# Patient Record
Sex: Male | Born: 1970 | Race: White | Hispanic: No | Smoking: Current every day smoker
Health system: Southern US, Community
[De-identification: ages and names within clinical notes are randomized; demographics above are authoritative.]

## PROBLEM LIST (undated history)

## (undated) DIAGNOSIS — E079 Disorder of thyroid, unspecified: Secondary | ICD-10-CM

## (undated) DIAGNOSIS — I10 Essential (primary) hypertension: Secondary | ICD-10-CM

## (undated) DIAGNOSIS — E7800 Pure hypercholesterolemia, unspecified: Secondary | ICD-10-CM

## (undated) DIAGNOSIS — G5620 Lesion of ulnar nerve, unspecified upper limb: Secondary | ICD-10-CM

## (undated) DIAGNOSIS — F32A Depression, unspecified: Secondary | ICD-10-CM

## (undated) DIAGNOSIS — G5610 Other lesions of median nerve, unspecified upper limb: Secondary | ICD-10-CM

## (undated) DIAGNOSIS — R7989 Other specified abnormal findings of blood chemistry: Secondary | ICD-10-CM

## (undated) DIAGNOSIS — J45909 Unspecified asthma, uncomplicated: Secondary | ICD-10-CM

## (undated) DIAGNOSIS — F419 Anxiety disorder, unspecified: Secondary | ICD-10-CM

## (undated) DIAGNOSIS — R531 Weakness: Secondary | ICD-10-CM

## (undated) DIAGNOSIS — E039 Hypothyroidism, unspecified: Secondary | ICD-10-CM

## (undated) DIAGNOSIS — M5412 Radiculopathy, cervical region: Secondary | ICD-10-CM

## (undated) DIAGNOSIS — F329 Major depressive disorder, single episode, unspecified: Secondary | ICD-10-CM

## (undated) DIAGNOSIS — E119 Type 2 diabetes mellitus without complications: Secondary | ICD-10-CM

## (undated) HISTORY — DX: Type 2 diabetes mellitus without complications: E11.9

## (undated) HISTORY — DX: Disorder of thyroid, unspecified: E07.9

## (undated) HISTORY — PX: BICEPS TENDON REPAIR: SHX566

## (undated) HISTORY — DX: Essential (primary) hypertension: I10

## (undated) HISTORY — PX: ROTATOR CUFF REPAIR: SHX139

## (undated) HISTORY — DX: Other specified abnormal findings of blood chemistry: R79.89

## (undated) HISTORY — PX: NECK SURGERY: SHX720

---

## 1998-08-23 ENCOUNTER — Ambulatory Visit (HOSPITAL_BASED_OUTPATIENT_CLINIC_OR_DEPARTMENT_OTHER): Admission: RE | Admit: 1998-08-23 | Discharge: 1998-08-23 | Payer: Self-pay | Admitting: General Surgery

## 2000-05-03 ENCOUNTER — Emergency Department (HOSPITAL_COMMUNITY): Admission: EM | Admit: 2000-05-03 | Discharge: 2000-05-03 | Payer: Self-pay | Admitting: Emergency Medicine

## 2000-06-03 ENCOUNTER — Encounter: Payer: Self-pay | Admitting: General Surgery

## 2000-06-03 ENCOUNTER — Ambulatory Visit (HOSPITAL_COMMUNITY): Admission: RE | Admit: 2000-06-03 | Discharge: 2000-06-03 | Payer: Self-pay | Admitting: General Surgery

## 2000-07-17 ENCOUNTER — Emergency Department (HOSPITAL_COMMUNITY): Admission: EM | Admit: 2000-07-17 | Discharge: 2000-07-17 | Payer: Self-pay | Admitting: Emergency Medicine

## 2000-07-17 ENCOUNTER — Encounter: Payer: Self-pay | Admitting: Emergency Medicine

## 2004-06-18 ENCOUNTER — Emergency Department (HOSPITAL_COMMUNITY): Admission: EM | Admit: 2004-06-18 | Discharge: 2004-06-18 | Payer: Self-pay | Admitting: Emergency Medicine

## 2005-03-25 ENCOUNTER — Ambulatory Visit (HOSPITAL_COMMUNITY): Admission: RE | Admit: 2005-03-25 | Discharge: 2005-03-25 | Payer: Self-pay | Admitting: Internal Medicine

## 2005-04-13 ENCOUNTER — Encounter: Admission: RE | Admit: 2005-04-13 | Discharge: 2005-04-13 | Payer: Self-pay | Admitting: Orthopaedic Surgery

## 2005-05-01 ENCOUNTER — Ambulatory Visit (HOSPITAL_COMMUNITY): Admission: RE | Admit: 2005-05-01 | Discharge: 2005-05-02 | Payer: Self-pay | Admitting: Orthopaedic Surgery

## 2005-09-24 ENCOUNTER — Encounter: Admission: RE | Admit: 2005-09-24 | Discharge: 2005-09-24 | Payer: Self-pay | Admitting: Orthopaedic Surgery

## 2005-11-14 ENCOUNTER — Encounter: Admission: RE | Admit: 2005-11-14 | Discharge: 2005-11-14 | Payer: Self-pay | Admitting: Internal Medicine

## 2005-12-09 ENCOUNTER — Inpatient Hospital Stay (HOSPITAL_COMMUNITY): Admission: RE | Admit: 2005-12-09 | Discharge: 2005-12-11 | Payer: Self-pay | Admitting: Orthopaedic Surgery

## 2013-03-29 DIAGNOSIS — E349 Endocrine disorder, unspecified: Secondary | ICD-10-CM | POA: Insufficient documentation

## 2013-05-06 DIAGNOSIS — G930 Cerebral cysts: Secondary | ICD-10-CM | POA: Insufficient documentation

## 2013-08-11 DIAGNOSIS — M5412 Radiculopathy, cervical region: Secondary | ICD-10-CM | POA: Insufficient documentation

## 2013-08-11 DIAGNOSIS — M5416 Radiculopathy, lumbar region: Secondary | ICD-10-CM | POA: Insufficient documentation

## 2013-11-13 ENCOUNTER — Encounter (HOSPITAL_COMMUNITY): Payer: Self-pay | Admitting: Emergency Medicine

## 2013-11-13 ENCOUNTER — Emergency Department (HOSPITAL_COMMUNITY)
Admission: EM | Admit: 2013-11-13 | Discharge: 2013-11-13 | Disposition: A | Discharge status: Home / Self Care | Payer: Medicaid Other | Attending: Emergency Medicine | Admitting: Emergency Medicine

## 2013-11-13 ENCOUNTER — Emergency Department (HOSPITAL_COMMUNITY): Payer: Medicaid Other

## 2013-11-13 DIAGNOSIS — R5383 Other fatigue: Secondary | ICD-10-CM

## 2013-11-13 DIAGNOSIS — I498 Other specified cardiac arrhythmias: Secondary | ICD-10-CM | POA: Insufficient documentation

## 2013-11-13 DIAGNOSIS — R5381 Other malaise: Secondary | ICD-10-CM | POA: Insufficient documentation

## 2013-11-13 DIAGNOSIS — R519 Headache, unspecified: Secondary | ICD-10-CM

## 2013-11-13 DIAGNOSIS — G5620 Lesion of ulnar nerve, unspecified upper limb: Secondary | ICD-10-CM | POA: Insufficient documentation

## 2013-11-13 DIAGNOSIS — F3289 Other specified depressive episodes: Secondary | ICD-10-CM | POA: Insufficient documentation

## 2013-11-13 DIAGNOSIS — F17200 Nicotine dependence, unspecified, uncomplicated: Secondary | ICD-10-CM | POA: Insufficient documentation

## 2013-11-13 DIAGNOSIS — F329 Major depressive disorder, single episode, unspecified: Secondary | ICD-10-CM | POA: Insufficient documentation

## 2013-11-13 DIAGNOSIS — K5900 Constipation, unspecified: Secondary | ICD-10-CM | POA: Insufficient documentation

## 2013-11-13 DIAGNOSIS — R51 Headache: Secondary | ICD-10-CM | POA: Insufficient documentation

## 2013-11-13 DIAGNOSIS — G5610 Other lesions of median nerve, unspecified upper limb: Secondary | ICD-10-CM | POA: Insufficient documentation

## 2013-11-13 DIAGNOSIS — Z7982 Long term (current) use of aspirin: Secondary | ICD-10-CM | POA: Insufficient documentation

## 2013-11-13 DIAGNOSIS — M549 Dorsalgia, unspecified: Secondary | ICD-10-CM

## 2013-11-13 DIAGNOSIS — Z880 Allergy status to penicillin: Secondary | ICD-10-CM | POA: Insufficient documentation

## 2013-11-13 DIAGNOSIS — Z79899 Other long term (current) drug therapy: Secondary | ICD-10-CM | POA: Insufficient documentation

## 2013-11-13 DIAGNOSIS — F411 Generalized anxiety disorder: Secondary | ICD-10-CM | POA: Insufficient documentation

## 2013-11-13 DIAGNOSIS — M5412 Radiculopathy, cervical region: Secondary | ICD-10-CM | POA: Insufficient documentation

## 2013-11-13 HISTORY — DX: Anxiety disorder, unspecified: F41.9

## 2013-11-13 HISTORY — DX: Major depressive disorder, single episode, unspecified: F32.9

## 2013-11-13 HISTORY — DX: Lesion of ulnar nerve, unspecified upper limb: G56.20

## 2013-11-13 HISTORY — DX: Other lesions of median nerve, unspecified upper limb: G56.10

## 2013-11-13 HISTORY — DX: Depression, unspecified: F32.A

## 2013-11-13 HISTORY — DX: Radiculopathy, cervical region: M54.12

## 2013-11-13 HISTORY — DX: Weakness: R53.1

## 2013-11-13 LAB — BASIC METABOLIC PANEL
BUN: 13 mg/dL (ref 6–23)
CHLORIDE: 100 meq/L (ref 96–112)
CO2: 25 meq/L (ref 19–32)
CREATININE: 0.96 mg/dL (ref 0.50–1.35)
Calcium: 9.3 mg/dL (ref 8.4–10.5)
GLUCOSE: 94 mg/dL (ref 70–99)
POTASSIUM: 4.3 meq/L (ref 3.7–5.3)
SODIUM: 138 meq/L (ref 137–147)

## 2013-11-13 LAB — CBC WITH DIFFERENTIAL/PLATELET
Basophils Absolute: 0 10*3/uL (ref 0.0–0.1)
Basophils Relative: 0 % (ref 0–1)
EOS ABS: 0.1 10*3/uL (ref 0.0–0.7)
EOS PCT: 2 % (ref 0–5)
HCT: 38.6 % — ABNORMAL LOW (ref 39.0–52.0)
HEMOGLOBIN: 13.2 g/dL (ref 13.0–17.0)
LYMPHS PCT: 36 % (ref 12–46)
Lymphs Abs: 1.7 10*3/uL (ref 0.7–4.0)
MCH: 31.1 pg (ref 26.0–34.0)
MCHC: 34.2 g/dL (ref 30.0–36.0)
MCV: 90.8 fL (ref 78.0–100.0)
Monocytes Absolute: 0.3 10*3/uL (ref 0.1–1.0)
Monocytes Relative: 7 % (ref 3–12)
NEUTROS PCT: 55 % (ref 43–77)
Neutro Abs: 2.6 10*3/uL (ref 1.7–7.7)
Platelets: 199 10*3/uL (ref 150–400)
RBC: 4.25 MIL/uL (ref 4.22–5.81)
RDW: 14.3 % (ref 11.5–15.5)
WBC: 4.7 10*3/uL (ref 4.0–10.5)

## 2013-11-13 NOTE — ED Notes (Signed)
Patient transported to CT 

## 2013-11-13 NOTE — ED Provider Notes (Signed)
CSN: [id number]     Arrival date & time 11/13/13  0919 History   First MD Initiated Contact with Patient 11/13/13 [phone]     Chief Complaint  Patient presents with  . Headache  . Bradycardia  . Weakness     (Consider location/radiation/quality/duration/timing/severity/associated sxs/prior Treatment) HPI  This a 43 year old male with a history of brain cyst, cervical radiculopathies, nerve dysfunction, residual left-sided weakness who presents with multiple complaints. His primary complaints are headache and back pain. Patient has a history of headaches but states he has had a headache since Wednesday. He reports that it is temporal and rates it a 6/10. He has not taken anything for his pain. He denies any new weakness, numbness, or tingling. He denies any fevers or neck stiffness. Patient also reports back pain. He has been seen by his primary physician and a neurologist for the same. I reviewed neurology notes indicate he may need surgery at some point. Patient denies any urinary retention. He does endorse chronic constipation. Patient also is concerned that his heart rate was in the 50s this morning. He states "it got as low as 47." He denies any chest pain or shortness of breath. He denies any palpitations. He states "I just don't know what I should come."  Past Medical History  Diagnosis Date  . Ulnar neuropathy at elbow   . Radiculopathy of cervical region   . Median nerve dysfunction   . Weakness   . Anxiety   . Depression    History reviewed. No pertinent past surgical history. History reviewed. No pertinent family history. History  Substance Use Topics  . Smoking status: Current Every Day Smoker -- 0.50 packs/day    Types: Cigarettes  . Smokeless tobacco: Former NeurosurgeonUser    Types: Snuff, Chew  . Alcohol Use: No    Review of Systems  Constitutional: Negative.  Negative for fever.  Eyes: Negative for visual disturbance.  Respiratory: Negative.  Negative for chest tightness and  shortness of breath.   Cardiovascular: Negative.  Negative for chest pain.  Gastrointestinal: Positive for constipation. Negative for abdominal pain and diarrhea.  Genitourinary: Negative.  Negative for dysuria.       Denies urinary retention  Musculoskeletal: Positive for back pain.  Skin: Negative for rash.  Neurological: Negative for dizziness, weakness, numbness and headaches.  All other systems reviewed and are negative.      Allergies  Penicillins  Home Medications   Current Outpatient Rx  Name  Route  Sig  Dispense  Refill  . ALPRAZolam (XANAX) 0.5 MG tablet   Oral   Take 0.25-0.5 mg by mouth See admin instructions. Take 1/2 tablet at bedtime and one tablet three times a day.         Marland Kitchen. aspirin EC 81 MG tablet   Oral   Take 81 mg by mouth daily.         . Aspirin-Acetaminophen-Caffeine (GOODY HEADACHE PO)   Oral   Take 1 each by mouth as needed (for headache).         Marland Kitchen. atenolol (TENORMIN) 100 MG tablet   Oral   Take 25 mg by mouth daily.         . Flaxseed, Linseed, (FLAXSEED OIL) 1200 MG CAPS   Oral   Take 1 capsule by mouth 2 (two) times daily.         . Garlic 1000 MG CAPS   Oral   Take 1 capsule by mouth daily.         .Marland Kitchen  gemfibrozil (LOPID) 600 MG tablet   Oral   Take 600 mg by mouth 2 (two) times daily before a meal.         . losartan (COZAAR) 100 MG tablet   Oral   Take 100 mg by mouth daily.         . Magnesium 250 MG TABS   Oral   Take 1 tablet by mouth daily.         . Omega-3 Fatty Acids (FISH OIL) 1000 MG CAPS   Oral   Take 2 capsules by mouth daily.         Marland Kitchen PARoxetine (PAXIL) 20 MG tablet   Oral   Take 20 mg by mouth 2 (two) times daily.         . traZODone (DESYREL) 100 MG tablet   Oral   Take 100 mg by mouth at bedtime as needed for sleep.          BP 107/85  Pulse 52  Temp(Src) 98.1 F (36.7 C) (Oral)  Resp 18  SpO2 95% Physical Exam  Nursing note and vitals reviewed. Constitutional: He is  oriented to person, place, and time. No distress.  HENT:  Head: Atraumatic.  Mouth/Throat: Oropharynx is clear and moist.  Scar noted to the left for head, well-healed  Eyes: Pupils are equal, round, and reactive to light.  Neck: Neck supple.  Cardiovascular: Normal rate, regular rhythm and normal heart sounds.   No murmur heard. Pulmonary/Chest: Effort normal and breath sounds normal. No respiratory distress. He has no wheezes.  Abdominal: Soft. Bowel sounds are normal. There is no tenderness. There is no rebound.  Musculoskeletal: He exhibits no edema.  No midline tenderness to palpation over the T. or L-spine, no step off or deformity noted  Lymphadenopathy:    He has no cervical adenopathy.  Neurological: He is alert and oriented to person, place, and time.  5 out of 5 strength in all 4 extremities including grip strength, coordination intact finger-nose-finger  Skin: Skin is warm and dry.  Psychiatric: He has a normal mood and affect.    ED Course  Procedures (including critical care time) Labs Review Labs Reviewed  CBC WITH DIFFERENTIAL - Abnormal; Notable for the following:    HCT 38.6 (*)    All other components within normal limits  BASIC METABOLIC PANEL   Imaging Review Ct Head Wo Contrast  11/13/2013   CLINICAL DATA:  Headache, hypertension, weakness.  EXAM: CT HEAD WITHOUT CONTRAST  TECHNIQUE: Contiguous axial images were obtained from the base of the skull through the vertex without intravenous contrast.  COMPARISON:  None.  FINDINGS: CSF density cyst in the posterior right frontal region appears contiguous with the body of the right lateral ventricle and measures 5.5 x 4.8 cm. The overlying cortex appears intact. Incidental note is made of a cavum septum pellucidum et vergae. There is no evidence of acute cortical infarct, midline shift, intracranial hemorrhage, or extra-axial fluid collection.  Left frontal scalp lipoma measures 5.1 x 1.0 cm. Orbits are unremarkable.  There is a small left mastoid effusion. Paranasal sinuses demonstrate mild bilateral maxillary sinus mucosal thickening, incompletely imaged.  IMPRESSION: 1. No evidence of acute intracranial abnormality. 2. Right frontal porencephalic cyst.   Electronically Signed   By: Sebastian Ache   On: 11/13/2013 11:06    EKG Interpretation    Date/Time:  Saturday November 13 2013 10:14:51 EST Ventricular Rate:  50 PR Interval:  156 QRS Duration: 93 QT Interval:  438 QTC Calculation: 399 R Axis:   -4 Text Interpretation:  Sinus rhythm Confirmed by Keosha Rossa  MD, Ramesh Moan ([id number]) on 11/13/2013 10:47:33 AM            MDM   Final diagnoses:  Back pain  Headache    Patient presents with multiple complaints.  Several chronic.  Nontoxic and nonfocal. Afebrile. W/U neg.  Patient to follow-up with PCP.  After history, exam, and medical workup I feel the patient has been appropriately medically screened and is safe for discharge home. Pertinent diagnoses were discussed with the patient. Patient was given return precautions.    Shon Baton, MD 11/13/13 [phone]

## 2013-11-13 NOTE — ED Notes (Signed)
Pt returned from CT °

## 2013-11-13 NOTE — Discharge Instructions (Signed)
Back Pain, Adult Low back pain is very common. About 1 in 5 people have back pain.The cause of low back pain is rarely dangerous. The pain often gets better over time.About half of people with a sudden onset of back pain feel better in just 2 weeks. About 8 in 10 people feel better by 6 weeks.  CAUSES Some common causes of back pain include:  Strain of the muscles or ligaments supporting the spine.  Wear and tear (degeneration) of the spinal discs.  Arthritis.  Direct injury to the back. DIAGNOSIS Most of the time, the direct cause of low back pain is not known.However, back pain can be treated effectively even when the exact cause of the pain is unknown.Answering your caregiver's questions about your overall health and symptoms is one of the most accurate ways to make sure the cause of your pain is not dangerous. If your caregiver needs more information, he or she may order lab work or imaging tests (X-rays or MRIs).However, even if imaging tests show changes in your back, this usually does not require surgery. HOME CARE INSTRUCTIONS For many people, back pain returns.Since low back pain is rarely dangerous, it is often a condition that people can learn to [facility] their own.   Remain active. It is stressful on the back to sit or stand in one place. Do not sit, drive, or stand in one place for more than 30 minutes at a time. Take short walks on level surfaces as soon as pain allows.Try to increase the length of time you walk each day.  Do not stay in bed.Resting more than 1 or 2 days can delay your recovery.  Do not avoid exercise or work.Your body is made to move.It is not dangerous to be active, even though your back may hurt.Your back will likely heal faster if you return to being active before your pain is gone.  Pay attention to your body when you bend and lift. Many people have less discomfortwhen lifting if they bend their knees, keep the load close to their bodies,and  avoid twisting. Often, the most comfortable positions are those that put less stress on your recovering back.  Find a comfortable position to sleep. Use a firm mattress and lie on your side with your knees slightly bent. If you lie on your back, put a pillow under your knees.  Only take over-the-counter or prescription medicines as directed by your caregiver. Over-the-counter medicines to reduce pain and inflammation are often the most helpful.Your caregiver may prescribe muscle relaxant drugs.These medicines help dull your pain so you can more quickly return to your normal activities and healthy exercise.  Put ice on the injured area.  Put ice in a plastic bag.  Place a towel between your skin and the bag.  Leave the ice on for 15-20 minutes, 03-04 times a day for the first 2 to 3 days. After that, ice and heat may be alternated to reduce pain and spasms.  Ask your caregiver about trying back exercises and gentle massage. This may be of some benefit.  Avoid feeling anxious or stressed.Stress increases muscle tension and can worsen back pain.It is important to recognize when you are anxious or stressed and learn ways to manage it.Exercise is a great option. SEEK MEDICAL CARE IF:  You have pain that is not relieved with rest or medicine.  You have pain that does not improve in 1 week.  You have new symptoms.  You are generally not feeling well. SEEK  IMMEDIATE MEDICAL CARE IF:   You have pain that radiates from your back into your legs.  You develop new bowel or bladder control problems.  You have unusual weakness or numbness in your arms or legs.  You develop nausea or vomiting.  You develop abdominal pain.  You feel faint. Document Released: 09/09/2005 Document Revised: 03/10/2012 Document Reviewed: 01/28/2011 [facility] Patient Information 2014 [place], [place]. Migraine Headache A migraine headache is an intense, throbbing pain on one or both sides of your head. A  migraine can last for 30 minutes to several hours. CAUSES  The exact cause of a migraine headache is not always known. However, a migraine may be caused when nerves in the brain become irritated and release chemicals that cause inflammation. This causes pain. Certain things may also trigger migraines, such as:  Alcohol.  Smoking.  Stress.  Menstruation.  Aged cheeses.  Foods or drinks that contain nitrates, glutamate, aspartame, or tyramine.  Lack of sleep.  Chocolate.  Caffeine.  Hunger.  Physical exertion.  Fatigue.  Medicines used to treat chest pain (nitroglycerine), birth control pills, estrogen, and some blood pressure medicines. SIGNS AND SYMPTOMS  Pain on one or both sides of your head.  Pulsating or throbbing pain.  Severe pain that prevents daily activities.  Pain that is aggravated by any physical activity.  Nausea, vomiting, or both.  Dizziness.  Pain with exposure to bright lights, loud noises, or activity.  General sensitivity to bright lights, loud noises, or smells. Before you get a migraine, you may get warning signs that a migraine is coming (aura). An aura may include:  Seeing flashing lights.  Seeing bright spots, halos, or zig-zag lines.  Having tunnel vision or blurred vision.  Having feelings of numbness or tingling.  Having trouble talking.  Having muscle weakness. DIAGNOSIS  A migraine headache is often diagnosed based on:  Symptoms.  Physical exam.  A CT scan or MRI of your head. These imaging tests cannot diagnose migraines, but they can help rule out other causes of headaches. TREATMENT Medicines may be given for pain and nausea. Medicines can also be given to help prevent recurrent migraines.  HOME CARE INSTRUCTIONS  Only take over-the-counter or prescription medicines for pain or discomfort as directed by your health care provider. The use of long-term narcotics is not recommended.  Lie down in a dark, quiet room  when you have a migraine.  Keep a journal to find out what may trigger your migraine headaches. For example, write down:  What you eat and drink.  How much sleep you get.  Any change to your diet or medicines.  Limit alcohol consumption.  Quit smoking if you smoke.  Get 7 9 hours of sleep, or as recommended by your health care provider.  Limit stress.  Keep lights dim if bright lights bother you and make your migraines worse. SEEK IMMEDIATE MEDICAL CARE IF:   Your migraine becomes severe.  You have a fever.  You have a stiff neck.  You have vision loss.  You have muscular weakness or loss of muscle control.  You start losing your balance or have trouble walking.  You feel faint or pass out.  You have severe symptoms that are different from your first symptoms. MAKE SURE YOU:   Understand these instructions.  Will watch your condition.  Will get help right away if you are not doing well or get worse. Document Released: 09/09/2005 Document Revised: 06/30/2013 Document Reviewed: 05/17/2013 [facility] Patient Information 2014 [place],  [place]. ° °

## 2013-11-13 NOTE — ED Notes (Signed)
Pt from home with c/o headache and chronic weakness, denies any new symptoms.  Pt states he took his HR this morning and it "was 47."  Pt in NAD, A&O.

## 2014-02-03 ENCOUNTER — Emergency Department (HOSPITAL_COMMUNITY)
Admission: EM | Admit: 2014-02-03 | Discharge: 2014-02-04 | Disposition: A | Discharge status: Home / Self Care | Payer: Medicaid Other | Attending: Emergency Medicine | Admitting: Emergency Medicine

## 2014-02-03 ENCOUNTER — Other Ambulatory Visit: Payer: Self-pay

## 2014-02-03 ENCOUNTER — Emergency Department (HOSPITAL_COMMUNITY): Payer: Medicaid Other

## 2014-02-03 ENCOUNTER — Encounter (HOSPITAL_COMMUNITY): Payer: Self-pay | Admitting: Emergency Medicine

## 2014-02-03 DIAGNOSIS — I498 Other specified cardiac arrhythmias: Secondary | ICD-10-CM | POA: Insufficient documentation

## 2014-02-03 DIAGNOSIS — Z8669 Personal history of other diseases of the nervous system and sense organs: Secondary | ICD-10-CM | POA: Insufficient documentation

## 2014-02-03 DIAGNOSIS — F3289 Other specified depressive episodes: Secondary | ICD-10-CM | POA: Insufficient documentation

## 2014-02-03 DIAGNOSIS — F17200 Nicotine dependence, unspecified, uncomplicated: Secondary | ICD-10-CM | POA: Insufficient documentation

## 2014-02-03 DIAGNOSIS — Z880 Allergy status to penicillin: Secondary | ICD-10-CM | POA: Insufficient documentation

## 2014-02-03 DIAGNOSIS — Z79899 Other long term (current) drug therapy: Secondary | ICD-10-CM | POA: Insufficient documentation

## 2014-02-03 DIAGNOSIS — Z7982 Long term (current) use of aspirin: Secondary | ICD-10-CM | POA: Insufficient documentation

## 2014-02-03 DIAGNOSIS — F411 Generalized anxiety disorder: Secondary | ICD-10-CM | POA: Insufficient documentation

## 2014-02-03 DIAGNOSIS — R079 Chest pain, unspecified: Secondary | ICD-10-CM

## 2014-02-03 DIAGNOSIS — F329 Major depressive disorder, single episode, unspecified: Secondary | ICD-10-CM | POA: Insufficient documentation

## 2014-02-03 LAB — I-STAT CHEM 8, ED
BUN: 10 mg/dL (ref 6–23)
CREATININE: 1 mg/dL (ref 0.50–1.35)
Calcium, Ion: 1.2 mmol/L (ref 1.12–1.23)
Chloride: 99 mEq/L (ref 96–112)
GLUCOSE: 104 mg/dL — AB (ref 70–99)
HCT: 40 % (ref 39.0–52.0)
HEMOGLOBIN: 13.6 g/dL (ref 13.0–17.0)
Potassium: 4.3 mEq/L (ref 3.7–5.3)
Sodium: 139 mEq/L (ref 137–147)
TCO2: 28 mmol/L (ref 0–100)

## 2014-02-03 LAB — CBC WITH DIFFERENTIAL/PLATELET
Basophils Absolute: 0 10*3/uL (ref 0.0–0.1)
Basophils Relative: 1 % (ref 0–1)
Eosinophils Absolute: 0.1 10*3/uL (ref 0.0–0.7)
Eosinophils Relative: 2 % (ref 0–5)
HEMATOCRIT: 38.2 % — AB (ref 39.0–52.0)
HEMOGLOBIN: 12.7 g/dL — AB (ref 13.0–17.0)
LYMPHS ABS: 2 10*3/uL (ref 0.7–4.0)
LYMPHS PCT: 42 % (ref 12–46)
MCH: 30.8 pg (ref 26.0–34.0)
MCHC: 33.2 g/dL (ref 30.0–36.0)
MCV: 92.7 fL (ref 78.0–100.0)
Monocytes Absolute: 0.3 10*3/uL (ref 0.1–1.0)
Monocytes Relative: 6 % (ref 3–12)
NEUTROS ABS: 2.4 10*3/uL (ref 1.7–7.7)
NEUTROS PCT: 49 % (ref 43–77)
PLATELETS: 208 10*3/uL (ref 150–400)
RBC: 4.12 MIL/uL — ABNORMAL LOW (ref 4.22–5.81)
RDW: 13.7 % (ref 11.5–15.5)
WBC: 4.7 10*3/uL (ref 4.0–10.5)

## 2014-02-03 LAB — I-STAT TROPONIN, ED: Troponin i, poc: 0 ng/mL (ref 0.00–0.08)

## 2014-02-03 NOTE — ED Provider Notes (Signed)
CSN: [id number]     Arrival date & time 02/03/14  2000 History   First MD Initiated Contact with Patient 02/03/14 2004     Chief Complaint  Patient presents with  . Chest Pain  . Bradycardia     (Consider location/radiation/quality/duration/timing/severity/associated sxs/prior Treatment) HPI 43 year old male who 2-3 days constant well localized left lateral lower chest pain very mild, worse with deep breath palpation and torso position changes and nonexertional minimally pleuritic and denies shortness of breath even though that was in his chief complaint he states he just feels like he cannot get a deep breath but does not feel short of breath has no cough no fever no abdominal pain no back pain no rash no trauma normalization no other concerns no changes aspirin and nitroglycerin from EMS. PERC negative. Past Medical History  Diagnosis Date  . Ulnar neuropathy at elbow   . Radiculopathy of cervical region   . Median nerve dysfunction   . Weakness   . Anxiety   . Depression    History reviewed. No pertinent past surgical history. History reviewed. No pertinent family history. History  Substance Use Topics  . Smoking status: Current Every Day Smoker -- 0.50 packs/day    Types: Cigarettes  . Smokeless tobacco: Former NeurosurgeonUser    Types: Snuff, Chew  . Alcohol Use: No    Review of Systems  10 Systems reviewed and are negative for acute change except as noted in the HPI.  Allergies  Penicillins  Home Medications   Prior to Admission medications   Medication Sig Start Date End Date Taking? Authorizing Provider  ALPRAZolam Prudy Feeler(XANAX) 0.5 MG tablet Take 0.25-0.5 mg by mouth See admin instructions. Take 1/2 tablet at bedtime and one tablet three times a day.    Historical Provider, MD  aspirin EC 81 MG tablet Take 81 mg by mouth daily.    Historical Provider, MD  atenolol (TENORMIN) 100 MG tablet Take 25 mg by mouth daily.    Historical Provider, MD  Flaxseed, Linseed, (FLAXSEED OIL)  1200 MG CAPS Take 1 capsule by mouth 2 (two) times daily.    Historical Provider, MD  Garlic 1000 MG CAPS Take 1 capsule by mouth daily.    Historical Provider, MD  gemfibrozil (LOPID) 600 MG tablet Take 600 mg by mouth 2 (two) times daily before a meal.    Historical Provider, MD  losartan (COZAAR) 100 MG tablet Take 100 mg by mouth daily.    Historical Provider, MD  Magnesium 250 MG TABS Take 1 tablet by mouth daily.    Historical Provider, MD  Omega-3 Fatty Acids (FISH OIL) 1000 MG CAPS Take 2 capsules by mouth daily.    Historical Provider, MD  PARoxetine (PAXIL) 20 MG tablet Take 20 mg by mouth 2 (two) times daily.    Historical Provider, MD  traZODone (DESYREL) 100 MG tablet Take 100 mg by mouth at bedtime as needed for sleep.    Historical Provider, MD   BP 84/55  Pulse 44  Temp(Src) 98.6 F (37 C) (Oral)  Resp 20  Ht 5\' 8"  (1.727 m)  Wt 332 lb (150.594 kg)  BMI 50.49 kg/m2  SpO2 97% Physical Exam  Nursing note and vitals reviewed. Constitutional:  Awake, alert, nontoxic appearance.  HENT:  Head: Atraumatic.  Eyes: Right eye exhibits no discharge. Left eye exhibits no discharge.  Neck: Neck supple.  Cardiovascular: Normal rate and regular rhythm.   No murmur heard. Pulmonary/Chest: Effort normal and breath sounds normal. No respiratory  distress. He has no wheezes. He has no rales. He exhibits tenderness.  Reproducible left lower lateral chest wall tenderness no rash without deformity noted  Abdominal: Soft. Bowel sounds are normal. He exhibits no distension and no mass. There is no tenderness. There is no rebound and no guarding.  Musculoskeletal: He exhibits no edema and no tenderness.  Baseline ROM, no obvious new focal weakness.  Neurological: He is alert.  Mental status and motor strength appears baseline for patient and situation.  Skin: No rash noted.  Psychiatric: He has a normal mood and affect.    ED Course  Procedures (including critical care  time) Asymptomatic bradycardia pulse rate in the 40s in the emergency department patient states that his heartbeat is always very slow; will have him hold his beta blocker until he rechecks with his Dr.Patient / Family / Caregiver informed of clinical course, understand medical decision-making process, and agree with plan.  Labs Review Labs Reviewed  CBC WITH DIFFERENTIAL - Abnormal; Notable for the following:    RBC 4.12 (*)    Hemoglobin 12.7 (*)    HCT 38.2 (*)    All other components within normal limits  I-STAT CHEM 8, ED - Abnormal; Notable for the following:    Glucose, Bld 104 (*)    All other components within normal limits  I-STAT TROPOININ, ED    Imaging Review No results found.   EKG Interpretation   Date/Time:  Thursday Feb 03 2014 20:01:14 EDT Ventricular Rate:  47 PR Interval:  156 QRS Duration: 86 QT Interval:  450 QTC Calculation: 398 R Axis:   68 Text Interpretation:  Sinus bradycardia Cannot rule out Anterior infarct ,  age undetermined Abnormal ECG ED PHYSICIAN INTERPRETATION AVAILABLE IN  [facility] Confirmed by TEST, Record ([id number]) on 02/05/2014 10:03:04 AM     ECG Muse not working: Sinus bradycardia, ventricular rate 47, normal axis, no acute ischemic changes noted, no significant change noted compared with prior ECG  2346 Repeat ECG Muse not working: Sinus bradycardia, ventricular rate 40, normal axis, no acute ischemic changes noted, no significant change compared with prior ECG  MDM   Final diagnoses:  Chest pain    I doubt any other EMC precluding discharge at this time including, but not necessarily limited to the following:PE, AMI.    Roy [name] M Roy Hitchens, MD 02/06/14 [id number]

## 2014-02-03 NOTE — ED Notes (Signed)
Patient moved from hallway bed into room, hooked up to monitor and urine sample given. Patient now resting wit wife and daughter at bedside

## 2014-02-03 NOTE — ED Notes (Signed)
Pt c/o off and on left sided chest pain x 2-3 days with intermittent shortness of breath.  Pt also has anxiety disorder, physician currently decreasing xanax dosage.  Rates pain 2/10.  Has aspirin 324 mg and ntg sl x 3 by ems, no relief.  Denies N/V/D.

## 2014-02-04 NOTE — Discharge Instructions (Signed)
Discontinue your atenolol until you see your doctor.  Your caregiver has diagnosed you as having chest pain that is not specific for one problem, but does not require admission.  You are at low risk for an acute heart condition or other serious illness. Chest pain comes from many different causes.  SEEK IMMEDIATE MEDICAL ATTENTION IF: You have severe chest pain, especially if the pain is crushing or pressure-like and spreads to the arms, back, neck, or jaw, or if you have sweating, nausea (feeling sick to your stomach), or shortness of breath. THIS IS AN EMERGENCY. Don't wait to see if the pain will go away. Get medical help at once. Call 911 or 0 (operator). DO NOT drive yourself to the hospital.  Your chest pain gets worse and does not go away with rest.  You have an attack of chest pain lasting longer than usual, despite rest and treatment with the medications your caregiver has prescribed.  You wake from sleep with chest pain or shortness of breath.  You feel dizzy or faint.  You have chest pain not typical of your usual pain for which you originally saw your caregiver.  You have been diagnosed by your caregiver as likely having chest wall pain. SEEK IMMEDIATE MEDICAL ATTENTION IF: You develop a fever.  Your chest pains become severe or intolerable.  You develop new, unexplained symptoms (problems).  You develop shortness of breath, nausea, vomiting, sweating or feel light headed.  You develop a new cough or you cough up blood.

## 2014-02-04 NOTE — ED Notes (Signed)
Pt verbalized feeling better. D/C with family by bedside.No distress noted.

## 2014-03-29 ENCOUNTER — Other Ambulatory Visit: Payer: Self-pay | Admitting: Gastroenterology

## 2014-03-29 DIAGNOSIS — K5730 Diverticulosis of large intestine without perforation or abscess without bleeding: Secondary | ICD-10-CM

## 2014-03-29 DIAGNOSIS — K259 Gastric ulcer, unspecified as acute or chronic, without hemorrhage or perforation: Secondary | ICD-10-CM

## 2014-03-29 DIAGNOSIS — I8500 Esophageal varices without bleeding: Secondary | ICD-10-CM

## 2014-03-31 ENCOUNTER — Ambulatory Visit
Admission: RE | Admit: 2014-03-31 | Discharge: 2014-03-31 | Disposition: A | Discharge status: Home / Self Care | Payer: Medicaid Other | Source: Ambulatory Visit | Attending: Gastroenterology | Admitting: Gastroenterology

## 2014-03-31 DIAGNOSIS — K5730 Diverticulosis of large intestine without perforation or abscess without bleeding: Secondary | ICD-10-CM

## 2014-03-31 DIAGNOSIS — K259 Gastric ulcer, unspecified as acute or chronic, without hemorrhage or perforation: Secondary | ICD-10-CM

## 2014-03-31 DIAGNOSIS — I8500 Esophageal varices without bleeding: Secondary | ICD-10-CM

## 2014-06-04 DIAGNOSIS — M75101 Unspecified rotator cuff tear or rupture of right shoulder, not specified as traumatic: Secondary | ICD-10-CM | POA: Insufficient documentation

## 2014-06-04 DIAGNOSIS — M75122 Complete rotator cuff tear or rupture of left shoulder, not specified as traumatic: Secondary | ICD-10-CM

## 2014-10-06 DIAGNOSIS — E291 Testicular hypofunction: Secondary | ICD-10-CM | POA: Diagnosis not present

## 2014-10-06 DIAGNOSIS — R7301 Impaired fasting glucose: Secondary | ICD-10-CM | POA: Diagnosis not present

## 2014-10-06 DIAGNOSIS — R6882 Decreased libido: Secondary | ICD-10-CM | POA: Diagnosis not present

## 2014-10-06 DIAGNOSIS — I10 Essential (primary) hypertension: Secondary | ICD-10-CM | POA: Diagnosis not present

## 2014-10-07 ENCOUNTER — Telehealth: Payer: Self-pay | Admitting: Family Medicine

## 2014-10-07 DIAGNOSIS — R6889 Other general symptoms and signs: Secondary | ICD-10-CM | POA: Diagnosis not present

## 2014-10-07 DIAGNOSIS — Z4789 Encounter for other orthopedic aftercare: Secondary | ICD-10-CM | POA: Diagnosis not present

## 2014-10-07 DIAGNOSIS — M25511 Pain in right shoulder: Secondary | ICD-10-CM | POA: Diagnosis not present

## 2014-10-07 DIAGNOSIS — Z9889 Other specified postprocedural states: Secondary | ICD-10-CM | POA: Diagnosis not present

## 2014-10-07 NOTE — Telephone Encounter (Signed)
Patient states he will call back Monday to discuss becoming a new patient.

## 2014-10-13 DIAGNOSIS — F411 Generalized anxiety disorder: Secondary | ICD-10-CM | POA: Diagnosis not present

## 2014-10-18 ENCOUNTER — Telehealth: Payer: Self-pay | Admitting: Family Medicine

## 2014-10-18 NOTE — Telephone Encounter (Signed)
Patient currently has MCD and Medicare. He is currently taking paraxetine 20mg  BID, benicar/hctz 20/12.5, pantoprazole 40, gimfibrozoil 600 bid, trazadone 100mg  qhs, gabapentin 300 BID, alprazolam 0.5 QID. Appointment scheduled for 2/24 at 9:55 with [facility].

## 2014-11-01 ENCOUNTER — Emergency Department (HOSPITAL_COMMUNITY)
Admission: EM | Admit: 2014-11-01 | Discharge: 2014-11-01 | Disposition: A | Discharge status: Home / Self Care | Payer: Medicare Other | Attending: Emergency Medicine | Admitting: Emergency Medicine

## 2014-11-01 ENCOUNTER — Encounter (HOSPITAL_COMMUNITY): Payer: Self-pay | Admitting: *Deleted

## 2014-11-01 ENCOUNTER — Emergency Department (HOSPITAL_COMMUNITY): Payer: Medicare Other

## 2014-11-01 DIAGNOSIS — F419 Anxiety disorder, unspecified: Secondary | ICD-10-CM | POA: Diagnosis not present

## 2014-11-01 DIAGNOSIS — R079 Chest pain, unspecified: Secondary | ICD-10-CM | POA: Diagnosis not present

## 2014-11-01 DIAGNOSIS — R002 Palpitations: Secondary | ICD-10-CM

## 2014-11-01 DIAGNOSIS — R531 Weakness: Secondary | ICD-10-CM | POA: Diagnosis not present

## 2014-11-01 DIAGNOSIS — Z720 Tobacco use: Secondary | ICD-10-CM | POA: Diagnosis not present

## 2014-11-01 DIAGNOSIS — Z880 Allergy status to penicillin: Secondary | ICD-10-CM | POA: Diagnosis not present

## 2014-11-01 DIAGNOSIS — Z79899 Other long term (current) drug therapy: Secondary | ICD-10-CM | POA: Insufficient documentation

## 2014-11-01 DIAGNOSIS — Z8669 Personal history of other diseases of the nervous system and sense organs: Secondary | ICD-10-CM | POA: Insufficient documentation

## 2014-11-01 DIAGNOSIS — F329 Major depressive disorder, single episode, unspecified: Secondary | ICD-10-CM | POA: Insufficient documentation

## 2014-11-01 DIAGNOSIS — Z8739 Personal history of other diseases of the musculoskeletal system and connective tissue: Secondary | ICD-10-CM | POA: Diagnosis not present

## 2014-11-01 DIAGNOSIS — Z7982 Long term (current) use of aspirin: Secondary | ICD-10-CM | POA: Insufficient documentation

## 2014-11-01 DIAGNOSIS — R51 Headache: Secondary | ICD-10-CM | POA: Diagnosis not present

## 2014-11-01 DIAGNOSIS — R0789 Other chest pain: Secondary | ICD-10-CM | POA: Diagnosis not present

## 2014-11-01 LAB — BASIC METABOLIC PANEL
Anion gap: 11 (ref 5–15)
BUN: 12 mg/dL (ref 6–23)
CHLORIDE: 98 mmol/L (ref 96–112)
CO2: 24 mmol/L (ref 19–32)
Calcium: 9.6 mg/dL (ref 8.4–10.5)
Creatinine, Ser: 0.9 mg/dL (ref 0.50–1.35)
GFR calc non Af Amer: >90 mL/min (ref >90)
Glucose, Bld: 89 mg/dL (ref 70–99)
POTASSIUM: 3.9 mmol/L (ref 3.5–5.1)
SODIUM: 133 mmol/L — AB (ref 135–145)

## 2014-11-01 LAB — I-STAT TROPONIN, ED
TROPONIN I, POC: 0 ng/mL (ref 0.00–0.08)
Troponin i, poc: 0 ng/mL (ref 0.00–0.08)

## 2014-11-01 LAB — CBC
HCT: 38.5 % — ABNORMAL LOW (ref 39.0–52.0)
Hemoglobin: 13.3 g/dL (ref 13.0–17.0)
MCH: 30.4 pg (ref 26.0–34.0)
MCHC: 34.5 g/dL (ref 30.0–36.0)
MCV: 88.1 fL (ref 78.0–100.0)
Platelets: 266 10*3/uL (ref 150–400)
RBC: 4.37 MIL/uL (ref 4.22–5.81)
RDW: 14.6 % (ref 11.5–15.5)
WBC: 5.9 10*3/uL (ref 4.0–10.5)

## 2014-11-01 NOTE — Discharge Instructions (Signed)
Please follow up with cardiology. Your lab work, ECG, chest xray all normal today. Return if worsening symptoms.    Chest Pain (Nonspecific) It is often hard to give a specific diagnosis for the cause of chest pain. There is always a chance that your pain could be related to something serious, such as a heart attack or a blood clot in the lungs. You need to follow up with your health care provider for further evaluation. CAUSES   Heartburn.  Pneumonia or bronchitis.  Anxiety or stress.  Inflammation around your heart (pericarditis) or lung (pleuritis or pleurisy).  A blood clot in the lung.  A collapsed lung (pneumothorax). It can develop suddenly on its own (spontaneous pneumothorax) or from trauma to the chest.  Shingles infection (herpes zoster virus). The chest wall is composed of bones, muscles, and cartilage. Any of these can be the source of the pain.  The bones can be bruised by injury.  The muscles or cartilage can be strained by coughing or overwork.  The cartilage can be affected by inflammation and become sore (costochondritis). DIAGNOSIS  Lab tests or other studies may be needed to find the cause of your pain. Your health care provider may have you take a test called an ambulatory electrocardiogram (ECG). An ECG records your heartbeat patterns over a 24-hour period. You may also have other tests, such as:  Transthoracic echocardiogram (TTE). During echocardiography, sound waves are used to evaluate how blood flows through your heart.  Transesophageal echocardiogram (TEE).  Cardiac monitoring. This allows your health care provider to monitor your heart rate and rhythm in real time.  Holter monitor. This is a portable device that records your heartbeat and can help diagnose heart arrhythmias. It allows your health care provider to track your heart activity for several days, if needed.  Stress tests by exercise or by giving medicine that makes the heart beat  faster. TREATMENT   Treatment depends on what may be causing your chest pain. Treatment may include:  Acid blockers for heartburn.  Anti-inflammatory medicine.  Pain medicine for inflammatory conditions.  Antibiotics if an infection is present.  You may be advised to change lifestyle habits. This includes stopping smoking and avoiding alcohol, caffeine, and chocolate.  You may be advised to keep your head raised (elevated) when sleeping. This reduces the chance of acid going backward from your stomach into your esophagus. Most of the time, nonspecific chest pain will improve within 2-3 days with rest and mild pain medicine.  HOME CARE INSTRUCTIONS   If antibiotics were prescribed, take them as directed. Finish them even if you start to feel better.  For the next few days, avoid physical activities that bring on chest pain. Continue physical activities as directed.  Do not use any tobacco products, including cigarettes, chewing tobacco, or electronic cigarettes.  Avoid drinking alcohol.  Only take medicine as directed by your health care provider.  Follow your health care provider's suggestions for further testing if your chest pain does not go away.  Keep any follow-up appointments you made. If you do not go to an appointment, you could develop lasting (chronic) problems with pain. If there is any problem keeping an appointment, call to reschedule. SEEK MEDICAL CARE IF:   Your chest pain does not go away, even after treatment.  You have a rash with blisters on your chest.  You have a fever. SEEK IMMEDIATE MEDICAL CARE IF:   You have increased chest pain or pain that spreads to your  arm, neck, jaw, back, or abdomen.  You have shortness of breath.  You have an increasing cough, or you cough up blood.  You have severe back or abdominal pain.  You feel nauseous or vomit.  You have severe weakness.  You faint.  You have chills. This is an emergency. Do not wait to  see if the pain will go away. Get medical help at once. Call your local emergency services (911 in [place] Do not drive yourself to the hospital. MAKE SURE YOU:   Understand these instructions.  Will watch your condition.  Will get help right away if you are not doing well or get worse. Document Released: 06/19/2005 Document Revised: 09/14/2013 Document Reviewed: 04/14/2008 [facility] Patient Information 2015 [place], [place]. This information is not intended to replace advice given to you by your health care provider. Make sure you discuss any questions you have with your health care provider.

## 2014-11-01 NOTE — ED Provider Notes (Signed)
CSN: [id number]     Arrival date & time 11/01/14  1802 History   First MD Initiated Contact with Patient 11/01/14 2040     Chief Complaint  Patient presents with  . Chest Pain     (Consider location/radiation/quality/duration/timing/severity/associated sxs/prior Treatment) HPI Roy [name] is a 44 y.o. male with hx of htn, anxiety, presents to ED with complaint of chest pressure. Pt states he woke up this morning when his alarm went off, states sat up in bed and felt like his heart was "racing and pounding." States this lasted several minutes and resolved. States during this episode, he did not have any dizziness, chest pain, SOB. States since then however, he has had constant pressure in his chest and generalized malaise. States no hx of the same. No medications tried. Pain is not exertional. No fever, chills, cough. No swelling in extremities. No recent travel or surgeries.   Past Medical History  Diagnosis Date  . Ulnar neuropathy at elbow   . Radiculopathy of cervical region   . Median nerve dysfunction   . Weakness   . Anxiety   . Depression    History reviewed. No pertinent past surgical history. History reviewed. No pertinent family history. History  Substance Use Topics  . Smoking status: Current Every Day Smoker -- 0.50 packs/day    Types: Cigarettes  . Smokeless tobacco: Former NeurosurgeonUser    Types: Snuff, Chew  . Alcohol Use: No    Review of Systems  Constitutional: Negative for fever and chills.  Respiratory: Positive for chest tightness. Negative for cough and shortness of breath.   Cardiovascular: Positive for palpitations. Negative for chest pain and leg swelling.  Gastrointestinal: Negative for nausea, vomiting, abdominal pain, diarrhea and abdominal distention.  Musculoskeletal: Negative for myalgias, arthralgias, neck pain and neck stiffness.  Skin: Negative for rash.  Allergic/Immunologic: Negative for immunocompromised state.  Neurological: Positive for  weakness. Negative for dizziness, light-headedness, numbness and headaches.  All other systems reviewed and are negative.     Allergies  Penicillins  Home Medications   Prior to Admission medications   Medication Sig Start Date End Date Taking? Authorizing Provider  ALPRAZolam Prudy Feeler(XANAX) 0.5 MG tablet Take 0.25-0.5 mg by mouth See admin instructions. Take 1/2 tablet at bedtime and one tablet three times a day.   Yes Historical Provider, MD  aspirin EC 81 MG tablet Take 81 mg by mouth daily.   Yes Historical Provider, MD  Aspirin-Salicylamide-Caffeine (BC HEADACHE POWDER PO) Take 1 Package by mouth daily as needed (for pain).   Yes Historical Provider, MD  Flaxseed, Linseed, (FLAXSEED OIL) 1200 MG CAPS Take 1 capsule by mouth 2 (two) times daily.   Yes Historical Provider, MD  Garlic 1000 MG CAPS Take 1 capsule by mouth daily.   Yes Historical Provider, MD  gemfibrozil (LOPID) 600 MG tablet Take 600 mg by mouth 2 (two) times daily before a meal.   Yes Historical Provider, MD  Magnesium 250 MG TABS Take 1 tablet by mouth daily.   Yes Historical Provider, MD  olmesartan-hydrochlorothiazide (BENICAR HCT) 20-12.5 MG per tablet Take 1 tablet by mouth daily.   Yes Historical Provider, MD  Omega-3 Fatty Acids (FISH OIL) 1000 MG CAPS Take 2 capsules by mouth daily.   Yes Historical Provider, MD  PARoxetine (PAXIL) 20 MG tablet Take 20 mg by mouth at bedtime.    Yes Historical Provider, MD  testosterone (ANDROGEL) 50 MG/5GM (1%) GEL Place 5 g onto the skin daily.   Yes  Historical Provider, MD  traZODone (DESYREL) 100 MG tablet Take 100 mg by mouth at bedtime.    Yes Historical Provider, MD  losartan (COZAAR) 100 MG tablet Take 100 mg by mouth daily.    Historical Provider, MD   BP 123/49 mmHg  Pulse 60  Temp(Src) 97.3 F (36.3 C)  Resp 18  SpO2 97% Physical Exam  Constitutional: He is oriented to person, place, and time. He appears well-developed and well-nourished. No distress.  Morbidly obese   HENT:  Head: Normocephalic and atraumatic.  Eyes: Conjunctivae are normal.  Neck: Neck supple.  Cardiovascular: Normal rate, regular rhythm and normal heart sounds.   Pulmonary/Chest: Effort normal. No respiratory distress. He has no wheezes. He has no rales. He exhibits no tenderness.  Abdominal: Soft. Bowel sounds are normal. He exhibits no distension. There is no tenderness. There is no rebound.  Musculoskeletal: He exhibits no edema.  Neurological: He is alert and oriented to person, place, and time.  Skin: Skin is warm and dry.  Nursing note and vitals reviewed.   ED Course  Procedures (including critical care time) Labs Review Labs Reviewed  CBC - Abnormal; Notable for the following:    HCT 38.5 (*)    All other components within normal limits  BASIC METABOLIC PANEL - Abnormal; Notable for the following:    Sodium 133 (*)    All other components within normal limits  I-STAT TROPOININ, ED    Imaging Review Dg Chest 2 View  11/01/2014   CLINICAL DATA:  Acute onset of left-sided chest pain and headache. Initial encounter.  EXAM: CHEST  2 VIEW  COMPARISON:  Chest radiograph performed 02/03/2014  FINDINGS: The lungs are well-aerated and clear. There is no evidence of focal opacification, pleural effusion or pneumothorax.  The heart is normal in size; the mediastinal contour is within normal limits. No acute osseous abnormalities are seen. Cervical spinal fusion hardware is noted.  IMPRESSION: No acute cardiopulmonary process seen.   Electronically Signed   By: Roanna Raider M.D.   On: 11/01/2014 21:12     EKG Interpretation   Date/Time:  Tuesday November 01 2014 18:13:26 EST Ventricular Rate:  59 PR Interval:  148 QRS Duration: 84 QT Interval:  416 QTC Calculation: 411 R Axis:   0 Text Interpretation:  Sinus bradycardia Otherwise normal ECG No  significant change was found Confirmed by CAMPOS  MD, KEVIN ([id number]) on  11/01/2014 9:08:42 PM      MDM   Final diagnoses:   Chest pain, unspecified chest pain type  Palpitations    Patient in emergency department with palpitations that started when he woke up, and chest pressure since then. Pressure has been constant since 7 AM this morning. He appears to be anxious on exam. Exam is unremarkable. Patient has no cardiac history except for hypertension. Blood pressure is normal in ED. We'll check labs including troponin, EKG, chest x-ray. Patient is not hypoxic, not tachycardic, not tachypnea, no recent travel or surgeries, do not think he has a PE. He is low risk for coronary disease.  10:32 PM Patient's chest x-ray, lab work all unremarkable. 2 troponins obtained both negative. EKG showing no concerning changes. Patient does have history of anxiety, and he is wondering if that is what is happening. Given his episodes of palpitations, we'll discharge him home with cardiology follow-up. Pt agrees to the plan.   Filed Vitals:   11/01/14 1814 11/01/14 2100 11/01/14 2214 11/01/14 2216  BP: 123/49 103/56  102/56  Pulse: 60 58 57 63  Temp: 97.3 F (36.3 C)     Resp: SpO2: 97% 96% 96% 100%       Lottie Mussel, PA-C 11/01/14 2233  Lyanne Co, MD 11/01/14 2236

## 2014-11-01 NOTE — ED Notes (Signed)
Since this am lt. Sided cp, h/a all day. Thought it go away and be all right. No sob, no n/v.

## 2014-11-16 ENCOUNTER — Encounter: Payer: Self-pay | Admitting: Family Medicine

## 2014-11-16 ENCOUNTER — Ambulatory Visit (INDEPENDENT_AMBULATORY_CARE_PROVIDER_SITE_OTHER): Payer: Medicare Other | Admitting: Family Medicine

## 2014-11-16 VITALS — BP 111/74 | HR 68 | Temp 97.7°F | Ht 68.0 in | Wt 334.0 lb

## 2014-11-16 DIAGNOSIS — I10 Essential (primary) hypertension: Secondary | ICD-10-CM

## 2014-11-16 DIAGNOSIS — R7303 Prediabetes: Secondary | ICD-10-CM

## 2014-11-16 DIAGNOSIS — E785 Hyperlipidemia, unspecified: Secondary | ICD-10-CM

## 2014-11-16 DIAGNOSIS — E291 Testicular hypofunction: Secondary | ICD-10-CM | POA: Diagnosis not present

## 2014-11-16 DIAGNOSIS — R7309 Other abnormal glucose: Secondary | ICD-10-CM

## 2014-11-16 DIAGNOSIS — K2100 Gastro-esophageal reflux disease with esophagitis, without bleeding: Secondary | ICD-10-CM

## 2014-11-16 DIAGNOSIS — Z23 Encounter for immunization: Secondary | ICD-10-CM

## 2014-11-16 DIAGNOSIS — Z1211 Encounter for screening for malignant neoplasm of colon: Secondary | ICD-10-CM

## 2014-11-16 DIAGNOSIS — M2550 Pain in unspecified joint: Secondary | ICD-10-CM | POA: Diagnosis not present

## 2014-11-16 DIAGNOSIS — G609 Hereditary and idiopathic neuropathy, unspecified: Secondary | ICD-10-CM

## 2014-11-16 DIAGNOSIS — N521 Erectile dysfunction due to diseases classified elsewhere: Secondary | ICD-10-CM

## 2014-11-16 MED ORDER — MELOXICAM 15 MG PO TABS: 15.0000 mg | ORAL_TABLET | Freq: Every day | ORAL | Status: DC

## 2014-11-16 MED ORDER — NEBIVOLOL HCL 10 MG PO TABS: 10.0000 mg | ORAL_TABLET | Freq: Every day | ORAL | Status: DC

## 2014-11-16 MED ORDER — DULOXETINE HCL 30 MG PO CPEP: 30.0000 mg | ORAL_CAPSULE | Freq: Every day | ORAL | Status: DC

## 2014-11-16 NOTE — Patient Instructions (Addendum)
Discontinue paroxetine.  Start taking duloxetine 1 capsule with supper each night for 1 week. Then start taking 2 capsules with supper each night. 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  Basic Carbohydrate Counting for Diabetes Mellitus Carbohydrate counting is a method for keeping track of the amount of carbohydrates you eat. Eating carbohydrates naturally increases the level of sugar (glucose) in your blood, so it is important for you to know the amount that is okay for you to have in every meal. Carbohydrate counting helps keep the level of glucose in your blood within normal limits. The amount of carbohydrates allowed is different for every person. A dietitian can help you calculate the amount that is right for you. Once you know the amount of carbohydrates you can have, you can count the carbohydrates in the foods you want to eat. Carbohydrates are found in the following foods:  Grains, such as breads and cereals.  Dried beans and soy products.  Starchy vegetables, such as potatoes, peas, and corn.  Fruit and fruit juices.  Milk and yogurt.  Sweets and snack foods, such as cake, cookies, candy, chips, soft drinks, and fruit drinks. CARBOHYDRATE COUNTING There are two ways to count the carbohydrates in your food. You can use either of the methods or a combination of both. Reading the "Nutrition Facts" on Packaged Food The "Nutrition Facts" is an area that is included on the labels of almost all packaged food and beverages in the [place]. It includes the serving size of that food or beverage and information about the nutrients in each serving of the food, including the grams (g) of carbohydrate per serving.  Decide the number of servings of this food or beverage that you will be able to eat or drink. Multiply that number of servings by the number of grams of carbohydrate that is listed on the label for that serving.  The total will be the amount of carbohydrates you will be having when you eat or drink this food or beverage. Learning Standard Serving Sizes of Food When you eat food that is not packaged or does not include "Nutrition Facts" on the label, you need to measure the servings in order to count the amount of carbohydrates.A serving of most carbohydrate-rich foods contains about 15 g of carbohydrates. The following list includes serving sizes of carbohydrate-rich foods that provide 15 g ofcarbohydrate per serving:   1 slice of bread (1 oz) or 1 six-inch tortilla.    of a hamburger bun or English muffin.  4-6 crackers.   cup unsweetened dry cereal.    cup hot cereal.   cup rice or pasta.    cup mashed potatoes or  of a large baked potato.  1 cup fresh fruit or one small piece of fruit.    cup canned or frozen fruit or fruit juice.  1 cup milk.   cup plain fat-free yogurt or yogurt sweetened with artificial sweeteners.   cup cooked dried beans or starchy vegetable, such as peas, corn, or potatoes.  Decide the number of standard-size servings that you will eat. Multiply that number of servings by 15 (the grams of carbohydrates in that serving). For example, if you eat 2 cups of strawberries, you will have eaten 2 servings and 30 g of carbohydrates (2 servings x 15 g = 30 g). For foods such as soups and casseroles, in which more than one food is mixed in, you  will need to count the carbohydrates in each food that is included. EXAMPLE OF CARBOHYDRATE COUNTING Sample Dinner  3 oz chicken breast.   cup of brown rice.   cup of corn.  1 cup milk.   1 cup strawberries with sugar-free whipped topping.  Carbohydrate Calculation Step 1: Identify the foods that contain carbohydrates:   Rice.   Corn.   Milk.   Strawberries. Step 2:Calculate the number of servings eaten of each:   2 servings of rice.   1 serving of corn.   1 serving of milk.   1 serving  of strawberries. Step 3: Multiply each of those number of servings by 15 g:   2 servings of rice x 15 g = 30 g.   1 serving of corn x 15 g = 15 g.   1 serving of milk x 15 g = 15 g.   1 serving of strawberries x 15 g = 15 g. Step 4: Add together all of the amounts to find the total grams of carbohydrates eaten: 30 g + 15 g + 15 g + 15 g = 75 g. Document Released: 09/09/2005 Document Revised: 01/24/2014 Document Reviewed: 08/06/2013 [facility] Patient Information 2015 [place], [place]. This information is not intended to replace advice given to you by your health care provider. Make sure you discuss any questions you have with your health care provider.   USe miralax for constipation

## 2014-11-16 NOTE — Progress Notes (Signed)
Subjective:  Patient ID: Roy [name], male    DOB: 08-31-1971  Age: 44 y.o. MRN: [id number]  CC: Establish Care; Hypertension; Hyperlipidemia; Testosterone deficiency; and Gastrophageal Reflux   HPI [name] presents for patient is in today for new patient visit. He has multiple concerns. 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 He has some left lower extremity weakness as well. He walks with a limp. He has some orthopedic problems related to this. Specifically the right shoulder has recently been operated on because he has to overcompensate for the weakness on the left by using the right more heavily and additionally the left side is also causing pain now the right side and since rehabilitation he continues to see his orthopedist who has in fact started treating or at least evaluating the left side on the 29th of this month nerve conduction velocities of the left arm have been ordered. The pain from this one seems to start at the elbow and goes all the way down to the hand and fingers. The patient is having some numbness and tingling in the left fingertips as well and that is going to be included in the nerve conduction velocity testing as well. There is pain at the left thumb region at the thenar eminence region and at the anatomic snuffbox area. Has noted swelling at the base of the thumb on the right.  Patient has mentioned that his A1c has been borderline in the past he's never been considered a diabetic nor has he been given medication for it but he indicates that it's elevated enough that he may well be in the prediabetic range and requested hemoglobin A1c be performed  Patient also is concerned about his testosterone he's been using AndroGel but has seen on TV that it is dangerous medicine and he's concerned about continuing. He does have a good libido but he has had some erectile dysfunction and is frustrated by his  inability to have sexual intercourse. Onset about a year ago about the same time that some of the joint pains started as well.   Patient additionally takes pantoprazole for gastroesophageal reflux and is satisfied with that treatment at this point he is taking gabapentin 300 mg 3 times daily for nerve pain presuming his evaluation for the left arm and the right fingertips he is going to consider increasing it after that evaluation is complete.  Patient in for follow-up of hypertension. Patient has no history of headache chest pain or shortness of breath or recent cough. Patient also denies symptoms of TIA such as numbness weakness lateralizing. Patient checks  blood pressure at home and has not had any elevated readings recently. Patient denies side effects from his medication. States taking it regularly.   History Darcel has a past medical history of Ulnar neuropathy at elbow; Radiculopathy of cervical region; Median nerve dysfunction; Weakness; Anxiety; and Depression.   He has no past surgical history on file.   His family history is not on file.He reports that he has been smoking Cigarettes.  He has been smoking about 0.50 packs per day. He has quit using smokeless tobacco. His smokeless tobacco use included Snuff and Chew. He reports that he does not drink alcohol or use illicit drugs.  Current Outpatient Prescriptions on File Prior to Visit  Medication Sig Dispense Refill  . ALPRAZolam (XANAX) 0.5 MG tablet Take 0.25-0.5 mg by mouth See admin instructions.  Take 1/2 tablet at bedtime and one tablet three times a day.    Marland Kitchen aspirin EC 81 MG tablet Take 81 mg by mouth daily.    . Aspirin-Salicylamide-Caffeine (BC HEADACHE POWDER PO) Take 1 Package by mouth daily as needed (for pain).    . Flaxseed, Linseed, (FLAXSEED OIL) 1200 MG CAPS Take 1 capsule by mouth 2 (two) times daily.    . Garlic 3734 MG CAPS Take 1 capsule by mouth daily.    Marland Kitchen gemfibrozil (LOPID) 600 MG tablet Take 600 mg by mouth  2 (two) times daily before a meal.    . losartan (COZAAR) 100 MG tablet Take 100 mg by mouth daily.    . Magnesium 250 MG TABS Take 1 tablet by mouth daily.    Marland Kitchen olmesartan-hydrochlorothiazide (BENICAR HCT) 20-12.5 MG per tablet Take 1 tablet by mouth daily.    . Omega-3 Fatty Acids (FISH OIL) 1000 MG CAPS Take 2 capsules by mouth daily.    Marland Kitchen PARoxetine (PAXIL) 20 MG tablet Take 20 mg by mouth at bedtime.     Marland Kitchen testosterone (ANDROGEL) 50 MG/5GM (1%) GEL Place 5 g onto the skin daily.    . traZODone (DESYREL) 100 MG tablet Take 100 mg by mouth at bedtime.      No current facility-administered medications on file prior to visit.    ROS Review of Systems  Constitutional: Negative for fever, chills, diaphoresis and unexpected weight change.  HENT: Negative for congestion, hearing loss, rhinorrhea, sore throat and trouble swallowing.   Respiratory: Negative for cough, chest tightness, shortness of breath and wheezing.   Gastrointestinal: Negative for nausea, vomiting, abdominal pain, diarrhea, constipation and abdominal distention.  Endocrine: Negative for cold intolerance and heat intolerance.  Genitourinary: Negative for dysuria, hematuria and flank pain.  Musculoskeletal: Negative for joint swelling and arthralgias.  Skin: Negative for rash.  Neurological: Negative for dizziness and headaches.  Psychiatric/Behavioral: Negative for dysphoric mood, decreased concentration and agitation. The patient is not nervous/anxious.     Objective:  BP 111/74 mmHg  Pulse 68  Temp(Src) 97.7 F (36.5 C) (Oral)  Ht '5\' 8"'  (1.727 m)  Wt 334 lb (151.501 kg)  BMI 50.80 kg/m2  BP Readings from Last 3 Encounters:  11/16/14 111/74  11/01/14 106/57  02/04/14 84/55    Wt Readings from Last 3 Encounters:  11/16/14 334 lb (151.501 kg)  02/03/14 332 lb (150.594 kg)     Physical Exam  Constitutional: He is oriented to person, place, and time. He appears well-developed and well-nourished. No distress.   HENT:  Head: Normocephalic and atraumatic.  Right Ear: External ear normal.  Left Ear: External ear normal.  Nose: Nose normal.  Mouth/Throat: Oropharynx is clear and moist.  Eyes: Conjunctivae and EOM are normal. Pupils are equal, round, and reactive to light.  Neck: Normal range of motion. Neck supple. No thyromegaly present.  Cardiovascular: Normal rate, regular rhythm and normal heart sounds.   No murmur heard. Pulmonary/Chest: Effort normal and breath sounds normal. No respiratory distress. He has no wheezes. He has no rales.  Abdominal: Soft. Bowel sounds are normal. He exhibits no distension. There is no tenderness.  Lymphadenopathy:    He has no cervical adenopathy.  Neurological: He is alert and oriented to person, place, and time. He has normal reflexes.  Skin: Skin is warm and dry.  Psychiatric: He has a normal mood and affect. His behavior is normal. Judgment and thought content normal.    Lab Results  Component Value  Date   HGBA1C 5.8% 11/17/2014    Lab Results  Component Value Date   WBC 5.5 11/17/2014   HGB 13.0 11/17/2014   HCT 38.5 11/17/2014   PLT 274 11/17/2014   GLUCOSE 101* 11/17/2014   CHOL 162 11/17/2014   TRIG 173* 11/17/2014   HDL 35* 11/17/2014   ALT 12 11/17/2014   AST 16 11/17/2014   NA 133* 11/17/2014   K 4.4 11/17/2014   CL 94* 11/17/2014   CREATININE 1.06 11/17/2014   BUN 17 11/17/2014   CO2 22 11/17/2014   TSH 4.870* 11/17/2014   PSA 0.3 11/17/2014   HGBA1C 5.8% 11/17/2014    Dg Chest 2 View  11/01/2014   CLINICAL DATA:  Acute onset of left-sided chest pain and headache. Initial encounter.  EXAM: CHEST  2 VIEW  COMPARISON:  Chest radiograph performed 02/03/2014  FINDINGS: The lungs are well-aerated and clear. There is no evidence of focal opacification, pleural effusion or pneumothorax.  The heart is normal in size; the mediastinal contour is within normal limits. No acute osseous abnormalities are seen. Cervical spinal fusion hardware  is noted.  IMPRESSION: No acute cardiopulmonary process seen.   Electronically Signed   By: Garald Balding M.D.   On: 11/01/2014 21:12    Assessment & Plan:   Calyn was seen today for establish care, hypertension, hyperlipidemia, testosterone deficiency and gastrophageal reflux.  Diagnoses and all orders for this visit:  Hypogonadism in male Orders: -     Cancel: CBC with Differential/Platelet -     CMP14+EGFR -     PSA, total and free -     Testosterone,Free and Total -     TSH -     POCT glycosylated hemoglobin (Hb A1C)  Essential hypertension Orders: -     nebivolol (BYSTOLIC) 10 MG tablet; Take 1 tablet (10 mg total) by mouth daily. For blood pressure -     Cancel: CBC with Differential/Platelet -     CMP14+EGFR -     CBC with Differential/Platelet  Gastroesophageal reflux disease with esophagitis Orders: -     Cancel: CBC with Differential/Platelet -     CMP14+EGFR -     CBC with Differential/Platelet  Arthralgia Orders: -     meloxicam (MOBIC) 15 MG tablet; Take 1 tablet (15 mg total) by mouth daily. -     DULoxetine (CYMBALTA) 30 MG capsule; Take 1 capsule (30 mg total) by mouth daily. After week one increase to 2. TAke with Supper -     Cancel: CBC with Differential/Platelet -     CMP14+EGFR  Erectile dysfunction due to diseases classified elsewhere Orders: -     Cancel: CBC with Differential/Platelet -     CMP14+EGFR  Hereditary and idiopathic peripheral neuropathy Orders: -     Cancel: CBC with Differential/Platelet -     CMP14+EGFR -     Testosterone,Free and Total  Prediabetes Orders: -     Cancel: CBC with Differential/Platelet -     CMP14+EGFR -     NMR, lipoprofile -     POCT glycosylated hemoglobin (Hb A1C) -     CBC with Differential/Platelet  Special screening for malignant neoplasms, colon Orders: -     PSA, total and free  Hyperlipidemia Orders: -     NMR, lipoprofile  Other orders -     Tdap vaccine greater than or equal to 7yo  IM  I am having Mr. Valliant start on meloxicam, nebivolol, and DULoxetine. I am also  having him maintain his PARoxetine, Flaxseed Oil, Garlic, ALPRAZolam, gemfibrozil, traZODone, losartan, aspirin EC, Fish Oil, Magnesium, Aspirin-Salicylamide-Caffeine (BC HEADACHE POWDER PO), olmesartan-hydrochlorothiazide, testosterone, gabapentin, and pantoprazole.  Meds ordered this encounter  Medications  . gabapentin (NEURONTIN) 300 MG capsule    Sig: Take 1 capsule by mouth 3 (three) times daily.  . pantoprazole (PROTONIX) 40 MG tablet    Sig: Take 40 mg by mouth daily.  . meloxicam (MOBIC) 15 MG tablet    Sig: Take 1 tablet (15 mg total) by mouth daily.    Dispense:  30 tablet    Refill:  0  . nebivolol (BYSTOLIC) 10 MG tablet    Sig: Take 1 tablet (10 mg total) by mouth daily. For blood pressure    Dispense:  30 tablet    Refill:  5  . DULoxetine (CYMBALTA) 30 MG capsule    Sig: Take 1 capsule (30 mg total) by mouth daily. After week one increase to 2. TAke with Supper    Dispense:  6 capsule    Refill:  3    Follow-up: Return in about 2 weeks (around 11/30/2014).  Claretta Fraise, M.D.

## 2014-11-17 ENCOUNTER — Other Ambulatory Visit: Payer: Medicare Other

## 2014-11-17 DIAGNOSIS — M2550 Pain in unspecified joint: Secondary | ICD-10-CM | POA: Diagnosis not present

## 2014-11-17 DIAGNOSIS — Z1211 Encounter for screening for malignant neoplasm of colon: Secondary | ICD-10-CM | POA: Diagnosis not present

## 2014-11-17 DIAGNOSIS — R7309 Other abnormal glucose: Secondary | ICD-10-CM | POA: Diagnosis not present

## 2014-11-17 DIAGNOSIS — K210 Gastro-esophageal reflux disease with esophagitis: Secondary | ICD-10-CM | POA: Diagnosis not present

## 2014-11-17 DIAGNOSIS — E785 Hyperlipidemia, unspecified: Secondary | ICD-10-CM | POA: Diagnosis not present

## 2014-11-17 DIAGNOSIS — N521 Erectile dysfunction due to diseases classified elsewhere: Secondary | ICD-10-CM | POA: Diagnosis not present

## 2014-11-17 DIAGNOSIS — N529 Male erectile dysfunction, unspecified: Secondary | ICD-10-CM | POA: Diagnosis not present

## 2014-11-17 DIAGNOSIS — I10 Essential (primary) hypertension: Secondary | ICD-10-CM | POA: Diagnosis not present

## 2014-11-17 DIAGNOSIS — G609 Hereditary and idiopathic neuropathy, unspecified: Secondary | ICD-10-CM | POA: Diagnosis not present

## 2014-11-17 DIAGNOSIS — E291 Testicular hypofunction: Secondary | ICD-10-CM | POA: Diagnosis not present

## 2014-11-17 LAB — POCT GLYCOSYLATED HEMOGLOBIN (HGB A1C): Hemoglobin A1C: 5.8

## 2014-11-17 NOTE — Progress Notes (Signed)
Lab work from 11-16-2014

## 2014-11-19 ENCOUNTER — Other Ambulatory Visit: Payer: Self-pay | Admitting: Family Medicine

## 2014-11-19 LAB — CBC WITH DIFFERENTIAL/PLATELET
Basophils Absolute: 0 x10E3/uL (ref 0.0–0.2)
Basos: 1 %
Eos: 3 %
Eosinophils Absolute: 0.2 x10E3/uL (ref 0.0–0.4)
HCT: 38.5 % (ref 37.5–51.0)
Hemoglobin: 13 g/dL (ref 12.6–17.7)
Immature Grans (Abs): 0 x10E3/uL (ref 0.0–0.1)
Immature Granulocytes: 0 %
Lymphocytes Absolute: 2.1 x10E3/uL (ref 0.7–3.1)
Lymphs: 38 %
MCH: 29.6 pg (ref 26.6–33.0)
MCHC: 33.8 g/dL (ref 31.5–35.7)
MCV: 88 fL (ref 79–97)
Monocytes Absolute: 0.4 x10E3/uL (ref 0.1–0.9)
Monocytes: 7 %
Neutrophils Absolute: 2.8 x10E3/uL (ref 1.4–7.0)
Neutrophils Relative %: 51 %
Platelets: 274 x10E3/uL (ref 150–379)
RBC: 4.39 x10E6/uL (ref 4.14–5.80)
RDW: 15.3 % (ref 12.3–15.4)
WBC: 5.5 x10E3/uL (ref 3.4–10.8)

## 2014-11-19 LAB — PSA, TOTAL AND FREE
PSA FREE PCT: 26.7 %
PSA, Free: 0.08 ng/mL
PSA: 0.3 ng/mL (ref 0.0–4.0)

## 2014-11-19 LAB — CMP14+EGFR
ALT: 12 IU/L (ref 0–44)
AST: 16 IU/L (ref 0–40)
Albumin/Globulin Ratio: 1.8 (ref 1.1–2.5)
Albumin: 4.5 g/dL (ref 3.5–5.5)
Alkaline Phosphatase: 72 IU/L (ref 39–117)
BUN/Creatinine Ratio: 16 (ref 9–20)
BUN: 17 mg/dL (ref 6–24)
Bilirubin Total: <0.2 mg/dL (ref 0.0–1.2)
CO2: 22 mmol/L (ref 18–29)
Calcium: 9.6 mg/dL (ref 8.7–10.2)
Chloride: 94 mmol/L — ABNORMAL LOW (ref 97–108)
Creatinine, Ser: 1.06 mg/dL (ref 0.76–1.27)
GFR calc Af Amer: 100 mL/min/1.73
GFR calc non Af Amer: 87 mL/min/1.73
Globulin, Total: 2.5 g/dL (ref 1.5–4.5)
Glucose: 101 mg/dL — ABNORMAL HIGH (ref 65–99)
Potassium: 4.4 mmol/L (ref 3.5–5.2)
Sodium: 133 mmol/L — ABNORMAL LOW (ref 134–144)
Total Protein: 7 g/dL (ref 6.0–8.5)

## 2014-11-19 LAB — NMR, LIPOPROFILE
Cholesterol: 162 mg/dL (ref 100–199)
HDL Cholesterol by NMR: 35 mg/dL — ABNORMAL LOW
HDL Particle Number: 24.5 umol/L — ABNORMAL LOW
LDL Particle Number: 1308 nmol/L — ABNORMAL HIGH
LDL Size: 20.9 nm
LDL-C: 92 mg/dL (ref 0–99)
LP-IR Score: 65 — ABNORMAL HIGH
Small LDL Particle Number: 756 nmol/L — ABNORMAL HIGH
Triglycerides by NMR: 173 mg/dL — ABNORMAL HIGH (ref 0–149)

## 2014-11-19 LAB — TESTOSTERONE,FREE AND TOTAL
Testosterone, Free: 13 pg/mL (ref 6.8–21.5)
Testosterone: 299 ng/dL — ABNORMAL LOW (ref 348–1197)

## 2014-11-19 LAB — TSH: TSH: 4.87 u[IU]/mL — AB (ref 0.450–4.500)

## 2014-11-19 MED ORDER — LEVOTHYROXINE SODIUM 50 MCG PO TABS: 50.0000 ug | ORAL_TABLET | Freq: Every day | ORAL | Status: DC

## 2014-11-21 ENCOUNTER — Telehealth: Payer: Self-pay | Admitting: Family Medicine

## 2014-11-21 DIAGNOSIS — I10 Essential (primary) hypertension: Secondary | ICD-10-CM

## 2014-11-21 MED ORDER — NEBIVOLOL HCL 5 MG PO TABS: 5.0000 mg | ORAL_TABLET | Freq: Every day | ORAL | Status: DC

## 2014-11-21 NOTE — Telephone Encounter (Signed)
Told patient that the side effects usually wears off fairly quickly. However I went ahead and send in a prescription for a half-strength pill. He should take that once daily until the symptom resolves. He can then try to go back up to the full strength 10 mg. Drinking 64 or more ounces of water daily is important as well.

## 2014-11-21 NOTE — Telephone Encounter (Signed)
Explained the recommendations to Mr Roy [name].  He stated that since changing BP med his readings have been elevated.  He does not understand why med was changed.  Please advise

## 2014-11-21 NOTE — Telephone Encounter (Signed)
The older medicine that he used, losartan, tends to contribute to his erectile dysfunction problem. The new one, Bystolic does not.

## 2014-11-21 NOTE — Telephone Encounter (Signed)
Spoke with pt to offer Dr Darlyn ReadStacks recommendation Told pt to increase water and to give med a chance to get in his system Pt has follow up on the 11th for rck Pt verbalizes understanding

## 2014-11-21 NOTE — Telephone Encounter (Signed)
Patient was started on bystolic and since starting this he has felt dizzy and his pulse has been in the 50's. He said he was also started on duloxetine and meloxicam.

## 2014-11-22 ENCOUNTER — Ambulatory Visit (INDEPENDENT_AMBULATORY_CARE_PROVIDER_SITE_OTHER): Payer: Medicare Other | Admitting: Cardiovascular Disease

## 2014-11-22 ENCOUNTER — Encounter: Payer: Self-pay | Admitting: Cardiovascular Disease

## 2014-11-22 ENCOUNTER — Telehealth: Payer: Self-pay | Admitting: *Deleted

## 2014-11-22 VITALS — BP 120/88 | HR 49 | Ht 68.0 in | Wt 337.6 lb

## 2014-11-22 DIAGNOSIS — R0789 Other chest pain: Secondary | ICD-10-CM | POA: Diagnosis not present

## 2014-11-22 NOTE — Patient Instructions (Signed)
Your physician has requested that you have a lexiscan myoview. For further information please visit [email or web address]. Please follow instruction sheet, as given.  Your physician recommends that you continue on your current medications as directed. Please refer to the Current Medication list given to you today.  Your physician recommends that you schedule a follow-up appointment in: as needed with Dr. Elease HashimotoNahser.

## 2014-11-22 NOTE — Telephone Encounter (Signed)
-----   Message from Mechele ClaudeWarren Stacks, MD sent at 11/19/2014  2:46 PM EST ----- Total patient that his testosterone remains low and it is very important that he use his AndroGel daily. Also his thyroid is underactive and he needs supplementation for that. I have sent in that prescription for him. Both of these problems should be rechecked in 2 months. Have him drop by for blood work for free and total testosterone, TSH, free T4 2 days before his next appointment.

## 2014-11-22 NOTE — Progress Notes (Signed)
Cardiology Office Note   Date:  11/22/2014   ID:  Roy [name], DOB 05/16/1971, MRN [id number]  PCP:  Mechele ClaudeSTACKS,WARREN, MD  Cardiologist:   Vesta MixerNahser, Harjas Biggins J, MD   Chief Complaint  Patient presents with  . Chest Pain  . Palpitations   Problem list: 1. Palpitations 2. Chest discomfort 3. Anxiety attacks   History of Present Illness: Roy GarfinkelStephen M [name] is a 44 y.o. male who presents for further evaluation of some chest pain. He has definite anxiety issues.   He had CP on Feb. 9.  He was not doing anything in particular.  Possibly thinking about something stressful. Has had several episodes.  Does not have any CP with activity. Does have DOE - he thinks because of his weight.  Pain lasted 30 -40 minutes.  Not associated with dyspnea. No radiation. No sweats   Does not watch his diet until recently.    Smokes - 1/2 ppd  No ETOH.   Does not exercise, is on disability.  Nerve problems and orthopedic issue.      Past Medical History  Diagnosis Date  . Ulnar neuropathy at elbow   . Radiculopathy of cervical region   . Median nerve dysfunction   . Weakness   . Anxiety   . Depression     History reviewed. No pertinent past surgical history.   Current Outpatient Prescriptions  Medication Sig Dispense Refill  . ALPRAZolam (XANAX) 0.5 MG tablet Take 0.25-0.5 mg by mouth See admin instructions. Take 1/2 tablet at bedtime and one tablet three times a day.    Marland Kitchen. aspirin EC 81 MG tablet Take 81 mg by mouth daily.    . Aspirin-Salicylamide-Caffeine (BC HEADACHE POWDER PO) Take 1 Package by mouth daily as needed (for pain).    . DULoxetine (CYMBALTA) 30 MG capsule Take 1 capsule (30 mg total) by mouth daily. After week one increase to 2. TAke with Supper 6 capsule 3  . Flaxseed, Linseed, (FLAXSEED OIL) 1200 MG CAPS Take 1 capsule by mouth 2 (two) times daily.    Marland Kitchen. gabapentin (NEURONTIN) 300 MG capsule Take 1 capsule by mouth 3 (three) times daily.    . Garlic 1000 MG CAPS Take 1  capsule by mouth daily.    Marland Kitchen. gemfibrozil (LOPID) 600 MG tablet Take 600 mg by mouth 2 (two) times daily before a meal.    . levothyroxine (SYNTHROID, LEVOTHROID) 50 MCG tablet Take 1 tablet (50 mcg total) by mouth daily. 30 tablet 2  . Magnesium 250 MG TABS Take 1 tablet by mouth daily.    . meloxicam (MOBIC) 15 MG tablet Take 1 tablet (15 mg total) by mouth daily. 30 tablet 0  . nebivolol (BYSTOLIC) 5 MG tablet Take 1 tablet (5 mg total) by mouth daily. For blood pressure 30 tablet 1  . Omega-3 Fatty Acids (FISH OIL) 1000 MG CAPS Take 2 capsules by mouth daily.    . pantoprazole (PROTONIX) 40 MG tablet Take 40 mg by mouth daily.    Marland Kitchen. PARoxetine (PAXIL) 20 MG tablet Take 20 mg by mouth at bedtime.     Marland Kitchen. testosterone (ANDROGEL) 50 MG/5GM (1%) GEL Place 5 g onto the skin daily.    . traZODone (DESYREL) 100 MG tablet Take 100 mg by mouth at bedtime.      No current facility-administered medications for this visit.    Allergies:   Penicillins    Social History:  The patient  reports that he has been smoking Cigarettes.  He has been smoking about 0.50 packs per day. He has quit using smokeless tobacco. His smokeless tobacco use included Snuff and Chew. He reports that he does not drink alcohol or use illicit drugs.   Family History:  The patient's family history includes Arrhythmia in his father; Hypertension in his mother.    ROS:  Please see the history of present illness.    Review of Systems: Constitutional:  denies fever, chills, diaphoresis, appetite change and fatigue.  HEENT: denies photophobia, eye pain, redness, hearing loss, ear pain, congestion, sore throat, rhinorrhea, sneezing, neck pain, neck stiffness and tinnitus.  Respiratory: denies SOB, DOE, cough, chest tightness, and wheezing.  Cardiovascular: admits to chest pain,   Gastrointestinal: denies nausea, vomiting, abdominal pain, diarrhea, constipation, blood in stool.  Genitourinary: denies dysuria, urgency, frequency,  hematuria, flank pain and difficulty urinating.  Musculoskeletal: denies  myalgias, back pain, joint swelling, arthralgias and gait problem.   Skin: denies pallor, rash and wound.  Neurological: denies dizziness, seizures, syncope, weakness, light-headedness, numbness and headaches.   Hematological: denies adenopathy, easy bruising, personal or family bleeding history.  Psychiatric/ Behavioral: denies suicidal ideation, mood changes, confusion, nervousness, sleep disturbance and agitation.       All other systems are reviewed and negative.    PHYSICAL EXAM: VS:  BP 120/88 mmHg  Pulse 49  Ht  (1.727 m)  Wt 337 lb 9.6 oz (153.134 kg)  BMI 51.34 kg/m2 , BMI Body mass index is 51.34 kg/(m^2). GEN: Well nourished, well developed, in no acute distress HEENT: normal Neck: no JVD, carotid bruits, or masses Cardiac: RRR; no murmurs, rubs, or gallops,no edema  Respiratory:  clear to auscultation bilaterally, normal work of breathing GI: soft, nontender, nondistended, + BS, morbid obesity  MS: no deformity or atrophy Skin: warm and dry, no rash Neuro:  Strength and sensation are intact Psych: normal   EKG:  EKG is ordered today. The ekg ordered today demonstrates sinus bradycardia 49 ,  No ST or T wave abn.    Recent Labs: 11/17/2014: ALT 12; BUN 17; Creatinine 1.06; Hemoglobin 13.0; Platelets 274; Potassium 4.4; Sodium 133*; TSH 4.870*    Lipid Panel    Component Value Date/Time   CHOL 162 11/17/2014 0828   TRIG 173* 11/17/2014 0828   HDL 35* 11/17/2014 0828      Wt Readings from Last 3 Encounters:  11/22/14 337 lb 9.6 oz (153.134 kg)  11/16/14 334 lb (151.501 kg)  02/03/14 332 lb (150.594 kg)      Other studies Reviewed: Additional studies/ records that were reviewed today include: . Review of the above records demonstrates:    ASSESSMENT AND PLAN:  1.  Chest pressure: The patient presents with an episode of chest pressure. The pain lasted for about 30  minutes. He has a history of morbid obesity and cigarette smoking. It's very difficult for him to tell if this was due to stress or not. He has definite symptoms of anxiety.  Based on his risk factors ( cigarette smoking, HTN,hyperlipidemia) , I think that we should proceed with a 2 day Lexiscan Myoview study. This will help [place] decide whether he needs any further evaluation. I'll see him on an as-needed basis. I'll see him back in the office soon if his Myoview study is positive and if he needs a cardiac catheterization. He'll follow-up with his general medical doctor.  2. Hypertension: Continue same medications.  3. Hyperlipidemia:  Continue current meds.  Follow up with his primary medical doctor  Current medicines are reviewed at length with the patient today.  The patient does not have concerns regarding medicines.  The following changes have been made:  no change   Disposition:   FU with me as needed.  Follow up with his general medical doctor.     Signed, Chella Chapdelaine, Deloris Ping, MD  11/22/2014 12:19 PM    [facility] [address] [place], [place], [place]  [address] Phone: [phone]; Fax: [phone]

## 2014-11-23 ENCOUNTER — Telehealth: Payer: Self-pay | Admitting: Family Medicine

## 2014-11-23 MED ORDER — GEMFIBROZIL 600 MG PO TABS: 600.0000 mg | ORAL_TABLET | Freq: Two times a day (BID) | ORAL | Status: DC

## 2014-11-23 NOTE — Telephone Encounter (Signed)
done

## 2014-11-28 ENCOUNTER — Encounter: Payer: Self-pay | Admitting: Family

## 2014-11-28 ENCOUNTER — Ambulatory Visit (INDEPENDENT_AMBULATORY_CARE_PROVIDER_SITE_OTHER): Payer: Medicare Other | Admitting: Family

## 2014-11-28 VITALS — BP 126/74 | HR 48 | Temp 97.2°F | Ht 68.0 in | Wt 340.2 lb

## 2014-11-28 DIAGNOSIS — J019 Acute sinusitis, unspecified: Secondary | ICD-10-CM | POA: Diagnosis not present

## 2014-11-28 DIAGNOSIS — R001 Bradycardia, unspecified: Secondary | ICD-10-CM | POA: Diagnosis not present

## 2014-11-28 MED ORDER — LEVOFLOXACIN 500 MG PO TABS: 500.0000 mg | ORAL_TABLET | Freq: Every day | ORAL | Status: DC

## 2014-11-28 NOTE — Progress Notes (Signed)
Subjective:    Patient ID: Roy [name], male    DOB: [birth year], 44 y.o.   MRN: [id number]  Sinusitis This is a new problem. The current episode started in the past 7 days. The problem has been waxing and waning since onset. There has been no fever. He is experiencing no pain. Associated symptoms include congestion, headaches, shortness of breath, sinus pressure and sneezing. Pertinent negatives include no chills, coughing, ear pain, hoarse voice or sore throat. Past treatments include oral decongestants. The treatment provided mild relief.      Review of Systems  Constitutional: Negative.  Negative for chills.  HENT: Positive for congestion, sinus pressure and sneezing. Negative for ear pain, hoarse voice and sore throat.   Respiratory: Positive for shortness of breath. Negative for cough.   Cardiovascular: Negative.   Gastrointestinal: Negative.   Endocrine: Negative.   Genitourinary: Negative.   Musculoskeletal: Negative.   Neurological: Positive for headaches.  Hematological: Negative.   Psychiatric/Behavioral: Negative.   All other systems reviewed and are negative.      Objective:   Physical Exam  Constitutional: He is oriented to person, place, and time. He appears well-developed and well-nourished. No distress.  HENT:  Head: Normocephalic.  Right Ear: External ear normal.  Left Ear: External ear normal.  Nose: Right sinus exhibits maxillary sinus tenderness. Left sinus exhibits maxillary sinus tenderness.  Mouth/Throat: Oropharynx is clear and moist.  Nasal passage erythemas with mild swelling  Oropharynx erythemas  Eyes: Pupils are equal, round, and reactive to light. Right eye exhibits no discharge. Left eye exhibits no discharge.  Neck: Normal range of motion. Neck supple. No thyromegaly present.  Cardiovascular: Normal rate, regular rhythm, normal heart sounds and intact distal pulses.   No murmur heard. Pulmonary/Chest: Effort normal and breath sounds  normal. No respiratory distress. He has no wheezes.  Abdominal: Soft. Bowel sounds are normal. He exhibits no distension. There is no tenderness.  Musculoskeletal: Normal range of motion. He exhibits no edema or tenderness.  Neurological: He is alert and oriented to person, place, and time. He has normal reflexes. No cranial nerve deficit.  Skin: Skin is warm and dry. No rash noted. No erythema.  Psychiatric: He has a normal mood and affect. His behavior is normal. Judgment and thought content normal.  Vitals reviewed.     BP 126/74 mmHg  Pulse 48  Temp(Src) 97.2 F (36.2 C) (Oral)  Ht  (1.727 m)  Wt 340 lb 3.2 oz (154.314 kg)  BMI 51.74 kg/m2     Assessment & Plan:  1. Acute sinusitis, recurrence not specified, unspecified location -- Take meds as prescribed - Use a cool mist humidifier  -Use saline nose sprays frequently -Saline irrigations of the nose can be very helpful if done frequently.  * 4X daily for 1 week*  * Use of a nettie pot can be helpful with this. Follow directions with this* -Force fluids -For any cough or congestion  Use plain Mucinex- regular strength or max strength is fine   * Children- consult with Pharmacist for dosing -For fever or aces or pains- take tylenol or ibuprofen appropriate for age and weight.  * for fevers greater than 101 orally you may alternate ibuprofen and tylenol every  3 hours. -Throat lozenges if help - levofloxacin (LEVAQUIN) 500 MG tablet; Take 1 tablet (500 mg total) by mouth daily.  Dispense: 7 tablet; Refill: 0  2. Bradycardia -Pt states he has been taking 10 mg of Bystolic- Told pt  to only take 5 mg of bystolic -Pt has follow up appointment with Dr. Darlyn ReadStacks on Friday- Pt to keep that appointment and discuss heart rate  Jannifer Rodneyhristy Ceonna Frazzini, FNP

## 2014-11-28 NOTE — Patient Instructions (Signed)

## 2014-11-29 DIAGNOSIS — R200 Anesthesia of skin: Secondary | ICD-10-CM | POA: Diagnosis not present

## 2014-12-01 ENCOUNTER — Ambulatory Visit (HOSPITAL_COMMUNITY): Payer: Medicare Other | Attending: Cardiology | Admitting: Radiology

## 2014-12-01 DIAGNOSIS — R002 Palpitations: Secondary | ICD-10-CM | POA: Diagnosis not present

## 2014-12-01 DIAGNOSIS — R0789 Other chest pain: Secondary | ICD-10-CM | POA: Insufficient documentation

## 2014-12-01 DIAGNOSIS — E669 Obesity, unspecified: Secondary | ICD-10-CM | POA: Diagnosis not present

## 2014-12-01 DIAGNOSIS — R0600 Dyspnea, unspecified: Secondary | ICD-10-CM | POA: Diagnosis not present

## 2014-12-01 MED ORDER — TECHNETIUM TC 99M SESTAMIBI GENERIC - CARDIOLITE
30.0000 | Freq: Once | INTRAVENOUS | Status: AC | PRN
  Administered 2014-12-01: 30 via INTRAVENOUS

## 2014-12-02 ENCOUNTER — Ambulatory Visit (HOSPITAL_COMMUNITY): Payer: Medicare Other | Attending: Internal Medicine

## 2014-12-02 ENCOUNTER — Ambulatory Visit: Payer: Medicare Other | Admitting: Family Medicine

## 2014-12-02 DIAGNOSIS — R0989 Other specified symptoms and signs involving the circulatory and respiratory systems: Secondary | ICD-10-CM

## 2014-12-02 MED ORDER — REGADENOSON 0.4 MG/5ML IV SOLN
0.4000 mg | Freq: Once | INTRAVENOUS | Status: AC
  Administered 2014-12-02: 0.4 mg via INTRAVENOUS

## 2014-12-02 MED ORDER — TECHNETIUM TC 99M SESTAMIBI GENERIC - CARDIOLITE
33.0000 | Freq: Once | INTRAVENOUS | Status: AC | PRN
  Administered 2014-12-02: 33 via INTRAVENOUS

## 2014-12-02 NOTE — Progress Notes (Signed)
[facility] SITE 3 NUCLEAR MED [address] , [place] [address] [phone]    Cardiology Nuclear Med Study  Roy GarfinkelStephen M [name] is a 44 y.o. male     MRN : [id number]     DOB: 08/25/1971  Procedure Date: 12/02/2014  Nuclear Med Background Indication for Stress Test:  Evaluation for Ischemia History:  No known CAD Cardiac Risk Factors: Obesity  Symptoms:  Chest Pain (last date of chest discomfort was three weeks ago), DOE and Palpitations   Nuclear Pre-Procedure Caffeine/Decaff Intake:  None NPO After: 5:30 pm   Lungs:  clear O2 Sat: 92% on room air. IV 0.9% NS with Angio Cath:  24g  IV Site: R Hand  IV Started by:  Frederick Peerseresa Johnson, EMT-P  Chest Size (in):  50+ Cup Size: n/a  Height: 5\' 8"  (1.727 m)  Weight:  330 lb (149.687 kg)  BMI:  Body mass index is 50.19 kg/(m^2). Tech Comments:  n/a    Nuclear Med Study 1 or 2 day study: 2 day  Stress Test Type:  Lexiscan  Reading MD: Roy Anconaalton McLean, MD  Order Authorizing Provider:  Kristeen MissPhilip Kristelle Cavallaro, MD  Resting Radionuclide: Technetium 4231m Sestamibi  Resting Radionuclide Dose: 33.0 mCi on 12/01/14   Stress Radionuclide:  Technetium 4531m Sestamibi  Stress Radionuclide Dose: 33.0 mCi on 12/02/14           Stress Protocol Rest HR: 48 Stress HR: 67  Rest BP: 118/64 Stress BP: 114/64  Exercise Time (min): n/a METS: n/a   Predicted Max HR: 179 bpm % Max HR: 37.43 bpm Rate Pressure Product: 8107   Dose of Adenosine (mg):  n/a Dose of Lexiscan: 0.4 mg  Dose of Atropine (mg): n/a Dose of Dobutamine: n/a mcg/kg/min (at max HR)  Stress Test Technologist: Roy [name], BS-ES  Nuclear Technologist:  Roy [name], CNMT     Rest Procedure:  Myocardial perfusion imaging was performed at rest 45 minutes following the intravenous administration of Technetium 2531m Sestamibi. Rest ECG: NSR - Normal EKG  Stress Procedure:  The patient received IV Lexiscan 0.4 mg over 15-seconds.  Technetium 7231m Sestamibi injected at 30-seconds.   Quantitative spect images were obtained after a 45 minute delay. During the infusion of Lexiscan the patient complained of SOB and cough that resolved in recovery.  Stress ECG: No significant change from baseline ECG  QPS Raw Data Images:  Normal; no motion artifact; normal heart/lung ratio. Stress Images:  There is mild apical thinning with normal uptake in other regions. Rest Images:  There is mild apical thinning with normal uptake in other regions. Subtraction (SDS):  No evidence of ischemia. Transient Ischemic Dilatation (Normal <1.22):  1.03 Lung/Heart Ratio (Normal <0.45):  0.36  Quantitative Gated Spect Images QGS EDV:  181 ml QGS ESV:  85 ml  Impression Exercise Capacity:  Lexiscan with no exercise. BP Response:  Normal blood pressure response. Clinical Symptoms:  No significant symptoms noted. ECG Impression:  No significant ST segment change suggestive of ischemia. Comparison with Prior Nuclear Study: No images to compare  Overall Impression:  Normal stress nuclear study.  No evidence of ischemia.    LV Ejection Fraction: 53%.  LV Wall Motion:  NL LV Function; NL Wall Motion.   Roy [name], Montez HagemanJr., MD, [facility] 12/05/2014, 5:27 PM [address],  [address] Office [phone] Pager [phone]

## 2014-12-07 ENCOUNTER — Encounter: Payer: Self-pay | Admitting: Family Medicine

## 2014-12-07 ENCOUNTER — Ambulatory Visit (INDEPENDENT_AMBULATORY_CARE_PROVIDER_SITE_OTHER): Payer: Medicare Other | Admitting: Family Medicine

## 2014-12-07 VITALS — BP 136/79 | HR 58 | Temp 97.6°F | Ht 68.0 in | Wt 332.6 lb

## 2014-12-07 DIAGNOSIS — M2550 Pain in unspecified joint: Secondary | ICD-10-CM

## 2014-12-07 DIAGNOSIS — I10 Essential (primary) hypertension: Secondary | ICD-10-CM | POA: Diagnosis not present

## 2014-12-07 DIAGNOSIS — E291 Testicular hypofunction: Secondary | ICD-10-CM | POA: Diagnosis not present

## 2014-12-07 DIAGNOSIS — N521 Erectile dysfunction due to diseases classified elsewhere: Secondary | ICD-10-CM

## 2014-12-07 MED ORDER — ALPRAZOLAM 0.5 MG PO TABS: 0.5000 mg | ORAL_TABLET | Freq: Four times a day (QID) | ORAL | Status: DC | PRN

## 2014-12-07 MED ORDER — TESTOSTERONE 20.25 MG/ACT (1.62%) TD GEL: TRANSDERMAL | Status: DC

## 2014-12-07 MED ORDER — DULOXETINE HCL 30 MG PO CPEP: 90.0000 mg | ORAL_CAPSULE | Freq: Every day | ORAL | Status: DC

## 2014-12-07 NOTE — Progress Notes (Signed)
Subjective:  Patient ID: Roy [name], male    DOB: Mar 12, 1971  Age: 44 y.o. MRN: [id number]  CC: Hypertension   HPI Roy [name] presents for follow-up of hypertension. Patient has no history of headache  or shortness of breath or recent cough. Patient also denies symptoms of TIA such as numbness weakness lateralizing. Patient checks  blood pressure at home and has not had any elevated readings recently. Patient denies side effects from his medication. States taking it regularly. Chest tightens, squeezing. Heart tested with apparent stress test.   Patient  also noted hypogonadism on previous blood work. This was noted to cause erectile dysfunction, and lack of a sense of well-being, difficulty with physical exertion. He is questioning whether he needs to be on medication. He would like to see improvement in the above symptoms. He has truncal obesity which she would like to see improvement as well.  History Roy [name] has a past medical history of Ulnar neuropathy at elbow; Radiculopathy of cervical region; Median nerve dysfunction; Weakness; Anxiety; and Depression.   He has no past surgical history on file.   His family history includes Arrhythmia in his father; Hypertension in his mother.He reports that he has been smoking Cigarettes.  He has been smoking about 0.50 packs per day. He has quit using smokeless tobacco. His smokeless tobacco use included Snuff and Chew. He reports that he does not drink alcohol or use illicit drugs.  Current Outpatient Prescriptions on File Prior to Visit  Medication Sig Dispense Refill  . aspirin EC 81 MG tablet Take 81 mg by mouth daily.    . Aspirin-Salicylamide-Caffeine (BC HEADACHE POWDER PO) Take 1 Package by mouth daily as needed (for pain).    . Flaxseed, Linseed, (FLAXSEED OIL) 1200 MG CAPS Take 1 capsule by mouth 2 (two) times daily.    Marland Kitchen gabapentin (NEURONTIN) 300 MG capsule Take 1 capsule by mouth 3 (three) times daily.    . Garlic 1000 MG  CAPS Take 1 capsule by mouth daily.    Marland Kitchen gemfibrozil (LOPID) 600 MG tablet TAKE ONE TABLET BY MOUTH TWICE DAILY    . levothyroxine (SYNTHROID, LEVOTHROID) 50 MCG tablet Take 1 tablet (50 mcg total) by mouth daily. 30 tablet 2  . Magnesium 250 MG TABS Take 1 tablet by mouth daily.    . meloxicam (MOBIC) 15 MG tablet Take 1 tablet (15 mg total) by mouth daily. 30 tablet 0  . nebivolol (BYSTOLIC) 5 MG tablet Take 1 tablet (5 mg total) by mouth daily. For blood pressure 30 tablet 1  . Omega-3 Fatty Acids (FISH OIL) 1000 MG CAPS Take 2 capsules by mouth daily.    . pantoprazole (PROTONIX) 40 MG tablet Take 40 mg by mouth daily.    Marland Kitchen PARoxetine (PAXIL) 20 MG tablet Take 20 mg by mouth at bedtime.     . traZODone (DESYREL) 100 MG tablet Take 100 mg by mouth at bedtime.      No current facility-administered medications on file prior to visit.    ROS Review of Systems  Constitutional: Negative for fever, chills, diaphoresis and unexpected weight change.  HENT: Negative for congestion, hearing loss, rhinorrhea, sore throat and trouble swallowing.   Respiratory: Positive for chest tightness. Negative for cough, shortness of breath and wheezing.   Cardiovascular: Negative for chest pain.  Gastrointestinal: Negative for nausea, vomiting, abdominal pain, diarrhea, constipation and abdominal distention.  Endocrine: Negative for cold intolerance and heat intolerance.  Genitourinary: Negative for dysuria, hematuria and flank  pain.  Musculoskeletal: Positive for myalgias and arthralgias (primarily in the knees). Negative for joint swelling.  Skin: Negative for rash.  Neurological: Negative for dizziness and headaches.  Psychiatric/Behavioral: Negative for dysphoric mood, decreased concentration and agitation. The patient is not nervous/anxious.     Objective:  BP 136/79 mmHg  Pulse 58  Temp(Src) 97.6 F (36.4 C) (Oral)  Ht  (1.727 m)  Wt 332 lb 9.6 oz (150.866 kg)  BMI 50.58 kg/m2  BP  Readings from Last 3 Encounters:  12/07/14 136/79  12/02/14 118/64  11/28/14 126/74    Wt Readings from Last 3 Encounters:  12/07/14 332 lb 9.6 oz (150.866 kg)  12/02/14 330 lb (149.687 kg)  11/28/14 340 lb 3.2 oz (154.314 kg)     Physical Exam  Constitutional: He is oriented to person, place, and time. He appears well-developed and well-nourished. No distress.  HENT:  Head: Normocephalic and atraumatic.  Right Ear: External ear normal.  Left Ear: External ear normal.  Nose: Nose normal.  Mouth/Throat: Oropharynx is clear and moist.  Eyes: Conjunctivae and EOM are normal. Pupils are equal, round, and reactive to light.  Neck: Normal range of motion. Neck supple. No thyromegaly present.  Cardiovascular: Normal rate, regular rhythm and normal heart sounds.   No murmur heard. Pulmonary/Chest: Effort normal and breath sounds normal. No respiratory distress. He has no wheezes. He has no rales.  Abdominal: Soft. Bowel sounds are normal. He exhibits no distension. There is no tenderness.  Lymphadenopathy:    He has no cervical adenopathy.  Neurological: He is alert and oriented to person, place, and time. He has normal reflexes.  Skin: Skin is warm and dry.  Psychiatric: He has a normal mood and affect. His behavior is normal. Judgment and thought content normal.    Lab Results  Component Value Date   HGBA1C 5.8% 11/17/2014    Lab Results  Component Value Date   WBC 5.5 11/17/2014   HGB 13.0 11/17/2014   HCT 38.5 11/17/2014   PLT 274 11/17/2014   GLUCOSE 101* 11/17/2014   CHOL 162 11/17/2014   TRIG 173* 11/17/2014   HDL 35* 11/17/2014   ALT 12 11/17/2014   AST 16 11/17/2014   NA 133* 11/17/2014   K 4.4 11/17/2014   CL 94* 11/17/2014   CREATININE 1.06 11/17/2014   BUN 17 11/17/2014   CO2 22 11/17/2014   TSH 4.870* 11/17/2014   PSA 0.3 11/17/2014   HGBA1C 5.8% 11/17/2014    Dg Chest 2 View  11/01/2014   CLINICAL DATA:  Acute onset of left-sided chest pain and  headache. Initial encounter.  EXAM: CHEST  2 VIEW  COMPARISON:  Chest radiograph performed 02/03/2014  FINDINGS: The lungs are well-aerated and clear. There is no evidence of focal opacification, pleural effusion or pneumothorax.  The heart is normal in size; the mediastinal contour is within normal limits. No acute osseous abnormalities are seen. Cervical spinal fusion hardware is noted.  IMPRESSION: No acute cardiopulmonary process seen.   Electronically Signed   By: Roanna Raider M.D.   On: 11/01/2014 21:12    Assessment & Plan:   Roy [name] was seen today for hypertension.  Diagnoses and all orders for this visit:  Arthralgia Orders: -     DULoxetine (CYMBALTA) 30 MG capsule; Take 3 capsules (90 mg total) by mouth daily.  Essential hypertension  Hypogonadism in male  Erectile dysfunction due to diseases classified elsewhere  Other orders -     ALPRAZolam Prudy Feeler) 0.5  MG tablet; Take 1 tablet (0.5 mg total) by mouth 4 (four) times daily as needed for anxiety. -     Testosterone (ANDROGEL PUMP) 20.25 MG/ACT (1.62%) GEL; Apply two pumps daily to upper chest and shoulders   I have discontinued Roy [name]'s levofloxacin. I have also changed his ALPRAZolam, Testosterone, and DULoxetine. Additionally, I am having him maintain his PARoxetine, Flaxseed Oil, Garlic, traZODone, aspirin EC, Fish Oil, Magnesium, Aspirin-Salicylamide-Caffeine (BC HEADACHE POWDER PO), gabapentin, pantoprazole, meloxicam, levothyroxine, nebivolol, and gemfibrozil.  Meds ordered this encounter  Medications  . ALPRAZolam (XANAX) 0.5 MG tablet    Sig: Take 1 tablet (0.5 mg total) by mouth 4 (four) times daily as needed for anxiety.    Dispense:  120 tablet    Refill:  2  . Testosterone (ANDROGEL PUMP) 20.25 MG/ACT (1.62%) GEL    Sig: Apply two pumps daily to upper chest and shoulders    Dispense:  150 g    Refill:  5  . DULoxetine (CYMBALTA) 30 MG capsule    Sig: Take 3 capsules (90 mg total) by mouth daily.     Dispense:  90 capsule    Refill:  5     Follow-up: Return in about 1 month (around 01/07/2015) for hypertension, thyroid, testosterone.  Mechele ClaudeWarren Roschelle Calandra, M.D.

## 2014-12-12 ENCOUNTER — Telehealth: Payer: Self-pay | Admitting: Family Medicine

## 2014-12-12 MED ORDER — CARVEDILOL 6.25 MG PO TABS: 6.2500 mg | ORAL_TABLET | Freq: Two times a day (BID) | ORAL | Status: DC

## 2014-12-12 NOTE — Telephone Encounter (Signed)
Substitute sent in. Please note that the substitute will have to be taken twice daily

## 2014-12-13 ENCOUNTER — Other Ambulatory Visit: Payer: Self-pay | Admitting: *Deleted

## 2014-12-13 DIAGNOSIS — M2550 Pain in unspecified joint: Secondary | ICD-10-CM

## 2014-12-13 MED ORDER — MELOXICAM 15 MG PO TABS: 15.0000 mg | ORAL_TABLET | Freq: Every day | ORAL | Status: DC

## 2014-12-13 NOTE — Telephone Encounter (Signed)
Patient aware.

## 2014-12-19 ENCOUNTER — Encounter: Payer: Self-pay | Admitting: Family Medicine

## 2014-12-19 ENCOUNTER — Ambulatory Visit (INDEPENDENT_AMBULATORY_CARE_PROVIDER_SITE_OTHER): Payer: Medicare Other | Admitting: Family Medicine

## 2014-12-19 VITALS — BP 139/81 | HR 63 | Temp 97.3°F | Ht 68.0 in | Wt 333.0 lb

## 2014-12-19 DIAGNOSIS — I10 Essential (primary) hypertension: Secondary | ICD-10-CM

## 2014-12-19 DIAGNOSIS — L309 Dermatitis, unspecified: Secondary | ICD-10-CM

## 2014-12-19 MED ORDER — CARVEDILOL 12.5 MG PO TABS: 6.2500 mg | ORAL_TABLET | Freq: Two times a day (BID) | ORAL | Status: DC

## 2014-12-19 MED ORDER — BETAMETHASONE DIPROPIONATE 0.05 % EX CREA: TOPICAL_CREAM | Freq: Two times a day (BID) | CUTANEOUS | Status: DC

## 2014-12-19 NOTE — Progress Notes (Signed)
0.  Subjective:  Patient ID: Roy [name], male    DOB: Nov 19, 1970  Age: 44 y.o. MRN: [id number]  CC: Hypertension   HPI CHEROKEE CLOWERS presents for recheck of his blood pressure. He is taking the medications as we discussed. He has been checking on his blood pressure between visits. Readings remain too high. Most are in the 140s systolic. NovoLog is returned. Of note is that the bystolic was not covered by his insurance we had to go back to the carvedilol. He has not had any erections since that time nor has he been sexually active. It is unclear whether the carvedilol will cause the erectile dysfunction at this time. History [name] has a past medical history of Ulnar neuropathy at elbow; Radiculopathy of cervical region; Median nerve dysfunction; Weakness; Anxiety; and Depression.   He has no past surgical history on file.   His family history includes Arrhythmia in his father; Hypertension in his mother.He reports that he has been smoking Cigarettes.  He has been smoking about 0.50 packs per day. He has quit using smokeless tobacco. His smokeless tobacco use included Snuff and Chew. He reports that he does not drink alcohol or use illicit drugs.  Current Outpatient Prescriptions on File Prior to Visit  Medication Sig Dispense Refill  . ALPRAZolam (XANAX) 0.5 MG tablet Take 1 tablet (0.5 mg total) by mouth 4 (four) times daily as needed for anxiety. 120 tablet 2  . Aspirin-Salicylamide-Caffeine (BC HEADACHE POWDER PO) Take 1 Package by mouth daily as needed (for pain).    . DULoxetine (CYMBALTA) 30 MG capsule Take 3 capsules (90 mg total) by mouth daily. 90 capsule 5  . Flaxseed, Linseed, (FLAXSEED OIL) 1200 MG CAPS Take 1 capsule by mouth 2 (two) times daily.    Marland Kitchen gabapentin (NEURONTIN) 300 MG capsule Take 1 capsule by mouth 3 (three) times daily.    . Garlic 1000 MG CAPS Take 1 capsule by mouth daily.    Marland Kitchen gemfibrozil (LOPID) 600 MG tablet TAKE ONE TABLET BY MOUTH TWICE DAILY    .  levothyroxine (SYNTHROID, LEVOTHROID) 50 MCG tablet Take 1 tablet (50 mcg total) by mouth daily. 30 tablet 2  . Magnesium 250 MG TABS Take 1 tablet by mouth daily.    . meloxicam (MOBIC) 15 MG tablet Take 1 tablet (15 mg total) by mouth daily. 30 tablet 1  . Omega-3 Fatty Acids (FISH OIL) 1000 MG CAPS Take 2 capsules by mouth daily.    . pantoprazole (PROTONIX) 40 MG tablet Take 40 mg by mouth daily.    Marland Kitchen PARoxetine (PAXIL) 20 MG tablet Take 20 mg by mouth at bedtime.     . Testosterone (ANDROGEL PUMP) 20.25 MG/ACT (1.62%) GEL Apply two pumps daily to upper chest and shoulders 150 g 5  . traZODone (DESYREL) 100 MG tablet Take 100 mg by mouth at bedtime.     Marland Kitchen aspirin EC 81 MG tablet Take 81 mg by mouth daily.     No current facility-administered medications on file prior to visit.    ROS Review of Systems  Constitutional: Negative for fever, chills and diaphoresis.  HENT: Negative for congestion and sore throat.   Respiratory: Negative for cough and shortness of breath.   Cardiovascular: Negative for chest pain.  Gastrointestinal: Negative for nausea, vomiting, abdominal pain, diarrhea, constipation, blood in stool and abdominal distention.  Genitourinary: Negative for dysuria, hematuria and flank pain.  Musculoskeletal: Negative for joint swelling and arthralgias.  Skin: Positive for rash.  Neurological: Negative for dizziness and weakness.  Psychiatric/Behavioral: The patient is not nervous/anxious.     Objective:  BP 139/81 mmHg  Pulse 63  Temp(Src) 97.3 F (36.3 C) (Oral)  Ht  (1.727 m)  Wt 333 lb (151.048 kg)  BMI 50.64 kg/m2  BP Readings from Last 3 Encounters:  12/19/14 139/81  12/07/14 136/79  12/02/14 118/64    Wt Readings from Last 3 Encounters:  12/19/14 333 lb (151.048 kg)  12/07/14 332 lb 9.6 oz (150.866 kg)  12/02/14 330 lb (149.687 kg)     Physical Exam  Constitutional: He appears well-developed and well-nourished.  HENT:  Head: Normocephalic  and atraumatic.  Right Ear: Tympanic membrane and external ear normal. No decreased hearing is noted.  Left Ear: Tympanic membrane and external ear normal. No decreased hearing is noted.  Mouth/Throat: No oropharyngeal exudate or posterior oropharyngeal erythema.  Eyes: Pupils are equal, round, and reactive to light.  Neck: Normal range of motion. Neck supple.  Cardiovascular: Normal rate and regular rhythm.   No murmur heard. Pulmonary/Chest: Breath sounds normal. No respiratory distress.  Abdominal: Soft. Bowel sounds are normal. He exhibits no mass. There is no tenderness.  Skin: Rash (skin eruption noted at the fingers. With papular vesicular lesions measuring 2-4 mm. There are some excoriated areas as well.) noted.  Vitals reviewed.   Lab Results  Component Value Date   HGBA1C 5.8% 11/17/2014    Lab Results  Component Value Date   WBC 5.5 11/17/2014   HGB 13.0 11/17/2014   HCT 38.5 11/17/2014   PLT 274 11/17/2014   GLUCOSE 101* 11/17/2014   CHOL 162 11/17/2014   TRIG 173* 11/17/2014   HDL 35* 11/17/2014   ALT 12 11/17/2014   AST 16 11/17/2014   NA 133* 11/17/2014   K 4.4 11/17/2014   CL 94* 11/17/2014   CREATININE 1.06 11/17/2014   BUN 17 11/17/2014   CO2 22 11/17/2014   TSH 4.870* 11/17/2014   PSA 0.3 11/17/2014   HGBA1C 5.8% 11/17/2014    Dg Chest 2 View  11/01/2014   CLINICAL DATA:  Acute onset of left-sided chest pain and headache. Initial encounter.  EXAM: CHEST  2 VIEW  COMPARISON:  Chest radiograph performed 02/03/2014  FINDINGS: The lungs are well-aerated and clear. There is no evidence of focal opacification, pleural effusion or pneumothorax.  The heart is normal in size; the mediastinal contour is within normal limits. No acute osseous abnormalities are seen. Cervical spinal fusion hardware is noted.  IMPRESSION: No acute cardiopulmonary process seen.   Electronically Signed   By: Roanna Raider M.D.   On: 11/01/2014 21:12    Assessment & Plan:   Eivan  was seen today for hypertension.  Diagnoses and all orders for this visit:  Eczema of right hand  Essential hypertension  Other orders -     betamethasone dipropionate (DIPROLENE) 0.05 % cream; Apply topically 2 (two) times daily. -     carvedilol (COREG) 12.5 MG tablet; Take 0.5 tablets (6.25 mg total) by mouth 2 (two) times daily with a meal. For blood pressure/heart/kidney   I have changed Mr. Linse carvedilol. I am also having him start on betamethasone dipropionate. Additionally, I am having him maintain his PARoxetine, Flaxseed Oil, Garlic, traZODone, aspirin EC, Fish Oil, Magnesium, Aspirin-Salicylamide-Caffeine (BC HEADACHE POWDER PO), gabapentin, pantoprazole, levothyroxine, gemfibrozil, ALPRAZolam, Testosterone, DULoxetine, and meloxicam.  Meds ordered this encounter  Medications  . betamethasone dipropionate (DIPROLENE) 0.05 % cream    Sig: Apply topically  2 (two) times daily.    Dispense:  30 g    Refill:  0  . carvedilol (COREG) 12.5 MG tablet    Sig: Take 0.5 tablets (6.25 mg total) by mouth 2 (two) times daily with a meal. For blood pressure/heart/kidney    Dispense:  60 tablet    Refill:  2     Follow-up: Return in about 1 month (around 01/19/2015).  Mechele ClaudeWarren Ahana Najera, M.D.

## 2014-12-19 NOTE — Patient Instructions (Signed)
Increase the new blood pressure pill carvedilol as follows. Take the 6.25 mg tablet each morning on Tuesday and Wednesday. Take the new 12.5 mg each evening Monday Tuesday and Wednesday. Starting Thursday morning March 31 take 12.5 mg tablets each morning and evening. Within a week after that time we will need to see her blood pressure below 135/85 but above 90/50.

## 2015-01-05 DIAGNOSIS — M5412 Radiculopathy, cervical region: Secondary | ICD-10-CM | POA: Diagnosis not present

## 2015-01-05 DIAGNOSIS — G5621 Lesion of ulnar nerve, right upper limb: Secondary | ICD-10-CM | POA: Diagnosis not present

## 2015-01-05 DIAGNOSIS — G5622 Lesion of ulnar nerve, left upper limb: Secondary | ICD-10-CM | POA: Diagnosis not present

## 2015-01-05 DIAGNOSIS — G5601 Carpal tunnel syndrome, right upper limb: Secondary | ICD-10-CM | POA: Diagnosis not present

## 2015-01-09 ENCOUNTER — Other Ambulatory Visit: Payer: Medicare Other

## 2015-01-09 DIAGNOSIS — M1811 Unilateral primary osteoarthritis of first carpometacarpal joint, right hand: Secondary | ICD-10-CM | POA: Diagnosis not present

## 2015-01-09 DIAGNOSIS — G5622 Lesion of ulnar nerve, left upper limb: Secondary | ICD-10-CM | POA: Diagnosis not present

## 2015-01-09 DIAGNOSIS — R52 Pain, unspecified: Secondary | ICD-10-CM | POA: Diagnosis not present

## 2015-01-09 DIAGNOSIS — M5412 Radiculopathy, cervical region: Secondary | ICD-10-CM | POA: Diagnosis not present

## 2015-01-10 ENCOUNTER — Other Ambulatory Visit (INDEPENDENT_AMBULATORY_CARE_PROVIDER_SITE_OTHER): Payer: Medicare Other

## 2015-01-10 DIAGNOSIS — E039 Hypothyroidism, unspecified: Secondary | ICD-10-CM | POA: Diagnosis not present

## 2015-01-10 DIAGNOSIS — E291 Testicular hypofunction: Secondary | ICD-10-CM

## 2015-01-10 NOTE — Progress Notes (Signed)
Lab only 

## 2015-01-10 NOTE — Addendum Note (Signed)
Addended by: Gwenith DailyHUDY, Aldrick Derrig N on: 01/10/2015 01:22 PM   Modules accepted: Orders

## 2015-01-12 ENCOUNTER — Encounter: Payer: Self-pay | Admitting: Family Medicine

## 2015-01-12 ENCOUNTER — Ambulatory Visit (INDEPENDENT_AMBULATORY_CARE_PROVIDER_SITE_OTHER): Payer: Medicare Other | Admitting: Family Medicine

## 2015-01-12 VITALS — BP 124/76 | HR 56 | Temp 97.4°F | Ht 68.0 in | Wt 328.4 lb

## 2015-01-12 DIAGNOSIS — I10 Essential (primary) hypertension: Secondary | ICD-10-CM | POA: Diagnosis not present

## 2015-01-12 DIAGNOSIS — E291 Testicular hypofunction: Secondary | ICD-10-CM | POA: Diagnosis not present

## 2015-01-12 DIAGNOSIS — G5622 Lesion of ulnar nerve, left upper limb: Secondary | ICD-10-CM | POA: Insufficient documentation

## 2015-01-12 LAB — TESTOSTERONE,FREE AND TOTAL
TESTOSTERONE FREE: 6.6 pg/mL — AB (ref 6.8–21.5)
TESTOSTERONE: 223 ng/dL — AB (ref 348–1197)

## 2015-01-12 LAB — TSH: TSH: 2.7 u[IU]/mL (ref 0.450–4.500)

## 2015-01-12 MED ORDER — TESTOSTERONE 20.25 MG/ACT (1.62%) TD GEL: TRANSDERMAL | Status: DC

## 2015-01-12 NOTE — Progress Notes (Signed)
Subjective:  Patient ID: Roy [name], male    DOB: [birth year]  Age: 44 y.o. MRN: [id number]  CC: Hypertension   HPI Roy [name] presents for follow-up of hypertension. Patient has no history of headache chest pain or shortness of breath or recent cough. Patient also denies symptoms of TIA such as numbness weakness lateralizing. Patient checks  blood pressure at home and has not had any elevated readings recently. Patient denies side effects from his medication. States taking it regularly. Patient also has had 2 recent testosterone levels that are low. At this point he is interested in testosterone supplementation if we can get it approved by his insurance  History Roy [name] has a past medical history of Ulnar neuropathy at elbow; Radiculopathy of cervical region; Median nerve dysfunction; Weakness; Anxiety; and Depression.   He has past surgical history that includes Rotator cuff repair (Right); Biceps tendon repair; and Neck surgery.   His family history includes Arrhythmia in his father; Hypertension in his mother.He reports that he has been smoking Cigarettes.  He has been smoking about 0.50 packs per day. He has quit using smokeless tobacco. His smokeless tobacco use included Snuff and Chew. He reports that he does not drink alcohol or use illicit drugs.  Current Outpatient Prescriptions on File Prior to Visit  Medication Sig Dispense Refill  . ALPRAZolam (XANAX) 0.5 MG tablet Take 1 tablet (0.5 mg total) by mouth 4 (four) times daily as needed for anxiety. 120 tablet 2  . aspirin EC 81 MG tablet Take 81 mg by mouth daily.    . Aspirin-Salicylamide-Caffeine (BC HEADACHE POWDER PO) Take 1 Package by mouth daily as needed (for pain).    Marland Kitchen betamethasone dipropionate (DIPROLENE) 0.05 % cream Apply topically 2 (two) times daily. 30 g 0  . carvedilol (COREG) 12.5 MG tablet Take 0.5 tablets (6.25 mg total) by mouth 2 (two) times daily with a meal. For blood pressure/heart/kidney 60 tablet  2  . DULoxetine (CYMBALTA) 30 MG capsule Take 3 capsules (90 mg total) by mouth daily. 90 capsule 5  . Flaxseed, Linseed, (FLAXSEED OIL) 1200 MG CAPS Take 1 capsule by mouth 2 (two) times daily.    Marland Kitchen gabapentin (NEURONTIN) 300 MG capsule Take 1 capsule by mouth 3 (three) times daily.    . Garlic 1000 MG CAPS Take 1 capsule by mouth daily.    Marland Kitchen gemfibrozil (LOPID) 600 MG tablet TAKE ONE TABLET BY MOUTH TWICE DAILY    . levothyroxine (SYNTHROID, LEVOTHROID) 50 MCG tablet Take 1 tablet (50 mcg total) by mouth daily. 30 tablet 2  . Magnesium 250 MG TABS Take 1 tablet by mouth daily.    . meloxicam (MOBIC) 15 MG tablet Take 1 tablet (15 mg total) by mouth daily. 30 tablet 1  . Omega-3 Fatty Acids (FISH OIL) 1000 MG CAPS Take 2 capsules by mouth daily.    . pantoprazole (PROTONIX) 40 MG tablet Take 40 mg by mouth daily.    . traZODone (DESYREL) 100 MG tablet Take 100 mg by mouth at bedtime.      No current facility-administered medications on file prior to visit.    ROS Review of Systems  Constitutional: Negative for fever, chills, diaphoresis and unexpected weight change.  HENT: Negative for congestion, hearing loss, rhinorrhea, sore throat and trouble swallowing.   Respiratory: Negative for cough, chest tightness, shortness of breath and wheezing.   Gastrointestinal: Negative for nausea, vomiting, abdominal pain, diarrhea, constipation and abdominal distention.  Endocrine: Negative for cold intolerance  and heat intolerance.  Genitourinary: Negative for dysuria, hematuria and flank pain.  Musculoskeletal: Negative for joint swelling and arthralgias.  Skin: Negative for rash.  Neurological: Negative for dizziness and headaches.  Psychiatric/Behavioral: Negative for dysphoric mood, decreased concentration and agitation. The patient is not nervous/anxious.     Objective:  BP 124/76 mmHg  Pulse 56  Temp(Src) 97.4 F (36.3 C) (Oral)  Ht 5\' 8"  (1.727 m)  Wt 328 lb 6.4 oz (148.961 kg)  BMI  49.94 kg/m2  BP Readings from Last 3 Encounters:  01/12/15 124/76  12/19/14 139/81  12/07/14 136/79    Wt Readings from Last 3 Encounters:  01/12/15 328 lb 6.4 oz (148.961 kg)  12/19/14 333 lb (151.048 kg)  12/07/14 332 lb 9.6 oz (150.866 kg)     Physical Exam  Constitutional: He is oriented to person, place, and time. He appears well-developed and well-nourished. No distress.  HENT:  Head: Normocephalic and atraumatic.  Right Ear: External ear normal.  Left Ear: External ear normal.  Nose: Nose normal.  Mouth/Throat: Oropharynx is clear and moist.  Eyes: Conjunctivae and EOM are normal. Pupils are equal, round, and reactive to light.  Neck: Normal range of motion. Neck supple. No thyromegaly present.  Cardiovascular: Normal rate, regular rhythm and normal heart sounds.   No murmur heard. Pulmonary/Chest: Effort normal and breath sounds normal. No respiratory distress. He has no wheezes. He has no rales.  Abdominal: Soft. Bowel sounds are normal. He exhibits no distension. There is no tenderness.  Lymphadenopathy:    He has no cervical adenopathy.  Neurological: He is alert and oriented to person, place, and time. He has normal reflexes.  Skin: Skin is warm and dry.  Psychiatric: He has a normal mood and affect. His behavior is normal. Judgment and thought content normal.    Lab Results  Component Value Date   HGBA1C 5.8% 11/17/2014    Lab Results  Component Value Date   WBC 5.5 11/17/2014   HGB 13.0 11/17/2014   HCT 38.5 11/17/2014   PLT 274 11/17/2014   GLUCOSE 101* 11/17/2014   CHOL 162 11/17/2014   TRIG 173* 11/17/2014   HDL 35* 11/17/2014   ALT 12 11/17/2014   AST 16 11/17/2014   NA 133* 11/17/2014   K 4.4 11/17/2014   CL 94* 11/17/2014   CREATININE 1.06 11/17/2014   BUN 17 11/17/2014   CO2 22 11/17/2014   TSH 2.700 01/10/2015   PSA 0.3 11/17/2014   HGBA1C 5.8% 11/17/2014    Dg Chest 2 View  11/01/2014   CLINICAL DATA:  Acute onset of left-sided  chest pain and headache. Initial encounter.  EXAM: CHEST  2 VIEW  COMPARISON:  Chest radiograph performed 02/03/2014  FINDINGS: The lungs are well-aerated and clear. There is no evidence of focal opacification, pleural effusion or pneumothorax.  The heart is normal in size; the mediastinal contour is within normal limits. No acute osseous abnormalities are seen. Cervical spinal fusion hardware is noted.  IMPRESSION: No acute cardiopulmonary process seen.   Electronically Signed   By: Roanna RaiderJeffery  Chang M.D.   On: 11/01/2014 21:12    Assessment & Plan:   Jeannett SeniorStephen was seen today for hypertension.  Diagnoses and all orders for this visit:  Essential hypertension  Hypogonadism in male  Other orders -     Testosterone (ANDROGEL PUMP) 20.25 MG/ACT (1.62%) GEL; Apply 4 pumps daily to upper chest and shoulders  I have discontinued Mr. Margaretmary EddyStith's PARoxetine. I have also changed his Testosterone.  Additionally, I am having him maintain his Flaxseed Oil, Garlic, traZODone, aspirin EC, Fish Oil, Magnesium, Aspirin-Salicylamide-Caffeine (BC HEADACHE POWDER PO), gabapentin, pantoprazole, levothyroxine, gemfibrozil, ALPRAZolam, DULoxetine, meloxicam, betamethasone dipropionate, and carvedilol.  Meds ordered this encounter  Medications  . Testosterone (ANDROGEL PUMP) 20.25 MG/ACT (1.62%) GEL    Sig: Apply 4 pumps daily to upper chest and shoulders    Dispense:  300 g    Refill:  5     Follow-up: Return in about 3 months (around 04/13/2015) for testosterone check (lab two days ahead).  Mechele Claude, M.D.

## 2015-01-13 DIAGNOSIS — F17210 Nicotine dependence, cigarettes, uncomplicated: Secondary | ICD-10-CM | POA: Diagnosis not present

## 2015-01-13 DIAGNOSIS — G4730 Sleep apnea, unspecified: Secondary | ICD-10-CM | POA: Diagnosis not present

## 2015-01-13 DIAGNOSIS — I10 Essential (primary) hypertension: Secondary | ICD-10-CM | POA: Diagnosis not present

## 2015-01-13 DIAGNOSIS — Z6841 Body Mass Index (BMI) 40.0 and over, adult: Secondary | ICD-10-CM | POA: Diagnosis not present

## 2015-01-13 DIAGNOSIS — F329 Major depressive disorder, single episode, unspecified: Secondary | ICD-10-CM | POA: Diagnosis not present

## 2015-01-13 DIAGNOSIS — E785 Hyperlipidemia, unspecified: Secondary | ICD-10-CM | POA: Diagnosis not present

## 2015-01-13 DIAGNOSIS — G5622 Lesion of ulnar nerve, left upper limb: Secondary | ICD-10-CM | POA: Diagnosis not present

## 2015-01-13 DIAGNOSIS — K219 Gastro-esophageal reflux disease without esophagitis: Secondary | ICD-10-CM | POA: Diagnosis not present

## 2015-01-13 DIAGNOSIS — Z880 Allergy status to penicillin: Secondary | ICD-10-CM | POA: Diagnosis not present

## 2015-01-13 DIAGNOSIS — Z79899 Other long term (current) drug therapy: Secondary | ICD-10-CM | POA: Diagnosis not present

## 2015-01-13 DIAGNOSIS — F419 Anxiety disorder, unspecified: Secondary | ICD-10-CM | POA: Diagnosis not present

## 2015-02-09 ENCOUNTER — Other Ambulatory Visit: Payer: Self-pay | Admitting: Family Medicine

## 2015-02-10 DIAGNOSIS — Z9889 Other specified postprocedural states: Secondary | ICD-10-CM | POA: Diagnosis not present

## 2015-02-10 DIAGNOSIS — M7551 Bursitis of right shoulder: Secondary | ICD-10-CM | POA: Diagnosis not present

## 2015-02-13 DIAGNOSIS — M7551 Bursitis of right shoulder: Secondary | ICD-10-CM | POA: Insufficient documentation

## 2015-02-13 DIAGNOSIS — Z6841 Body Mass Index (BMI) 40.0 and over, adult: Secondary | ICD-10-CM | POA: Diagnosis not present

## 2015-02-13 DIAGNOSIS — M542 Cervicalgia: Secondary | ICD-10-CM | POA: Diagnosis not present

## 2015-02-14 ENCOUNTER — Other Ambulatory Visit: Payer: Self-pay

## 2015-02-14 MED ORDER — GABAPENTIN 300 MG PO CAPS: 300.0000 mg | ORAL_CAPSULE | Freq: Three times a day (TID) | ORAL | Status: DC

## 2015-02-17 ENCOUNTER — Other Ambulatory Visit: Payer: Self-pay | Admitting: Neurosurgery

## 2015-02-17 DIAGNOSIS — M542 Cervicalgia: Secondary | ICD-10-CM

## 2015-02-24 ENCOUNTER — Other Ambulatory Visit: Payer: Self-pay | Admitting: *Deleted

## 2015-02-24 MED ORDER — TRAZODONE HCL 100 MG PO TABS: 100.0000 mg | ORAL_TABLET | Freq: Every day | ORAL | Status: DC

## 2015-02-27 DIAGNOSIS — K5900 Constipation, unspecified: Secondary | ICD-10-CM | POA: Diagnosis not present

## 2015-02-28 ENCOUNTER — Other Ambulatory Visit: Payer: Self-pay | Admitting: *Deleted

## 2015-02-28 MED ORDER — PANTOPRAZOLE SODIUM 40 MG PO TBEC: 40.0000 mg | DELAYED_RELEASE_TABLET | Freq: Every day | ORAL | Status: DC

## 2015-03-04 ENCOUNTER — Ambulatory Visit
Admission: RE | Admit: 2015-03-04 | Discharge: 2015-03-04 | Disposition: A | Discharge status: Home / Self Care | Payer: Medicare Other | Source: Ambulatory Visit | Attending: Neurosurgery | Admitting: Neurosurgery

## 2015-03-04 DIAGNOSIS — M542 Cervicalgia: Secondary | ICD-10-CM

## 2015-03-04 DIAGNOSIS — M5032 Other cervical disc degeneration, mid-cervical region: Secondary | ICD-10-CM | POA: Diagnosis not present

## 2015-03-04 DIAGNOSIS — Z981 Arthrodesis status: Secondary | ICD-10-CM | POA: Diagnosis not present

## 2015-03-04 DIAGNOSIS — M5022 Other cervical disc displacement, mid-cervical region: Secondary | ICD-10-CM | POA: Diagnosis not present

## 2015-03-06 DIAGNOSIS — M542 Cervicalgia: Secondary | ICD-10-CM | POA: Diagnosis not present

## 2015-03-06 DIAGNOSIS — Z6841 Body Mass Index (BMI) 40.0 and over, adult: Secondary | ICD-10-CM | POA: Diagnosis not present

## 2015-03-14 DIAGNOSIS — M25812 Other specified joint disorders, left shoulder: Secondary | ICD-10-CM | POA: Diagnosis not present

## 2015-03-14 DIAGNOSIS — M75101 Unspecified rotator cuff tear or rupture of right shoulder, not specified as traumatic: Secondary | ICD-10-CM | POA: Diagnosis not present

## 2015-03-14 DIAGNOSIS — Z9889 Other specified postprocedural states: Secondary | ICD-10-CM | POA: Diagnosis not present

## 2015-03-17 ENCOUNTER — Other Ambulatory Visit: Payer: Self-pay | Admitting: Family Medicine

## 2015-03-17 DIAGNOSIS — M75121 Complete rotator cuff tear or rupture of right shoulder, not specified as traumatic: Secondary | ICD-10-CM | POA: Diagnosis not present

## 2015-03-17 DIAGNOSIS — M62511 Muscle wasting and atrophy, not elsewhere classified, right shoulder: Secondary | ICD-10-CM | POA: Diagnosis not present

## 2015-03-22 DIAGNOSIS — Z9889 Other specified postprocedural states: Secondary | ICD-10-CM | POA: Diagnosis not present

## 2015-03-22 DIAGNOSIS — M75121 Complete rotator cuff tear or rupture of right shoulder, not specified as traumatic: Secondary | ICD-10-CM | POA: Diagnosis not present

## 2015-03-24 ENCOUNTER — Other Ambulatory Visit: Payer: Self-pay

## 2015-03-24 MED ORDER — GEMFIBROZIL 600 MG PO TABS: 600.0000 mg | ORAL_TABLET | Freq: Two times a day (BID) | ORAL | Status: DC

## 2015-03-28 ENCOUNTER — Other Ambulatory Visit: Payer: Self-pay | Admitting: Family Medicine

## 2015-03-29 NOTE — Telephone Encounter (Signed)
Last seen 01/12/15 DR Stacks   If approved route to nurse to call into [facility]

## 2015-03-29 NOTE — Telephone Encounter (Signed)
Please review and advise.

## 2015-03-30 ENCOUNTER — Other Ambulatory Visit: Payer: Self-pay | Admitting: Family Medicine

## 2015-03-30 ENCOUNTER — Telehealth: Payer: Self-pay | Admitting: Family Medicine

## 2015-03-30 NOTE — Telephone Encounter (Signed)
RX called into [facility].

## 2015-04-11 ENCOUNTER — Other Ambulatory Visit (INDEPENDENT_AMBULATORY_CARE_PROVIDER_SITE_OTHER): Payer: Medicare Other

## 2015-04-11 DIAGNOSIS — R799 Abnormal finding of blood chemistry, unspecified: Secondary | ICD-10-CM | POA: Diagnosis not present

## 2015-04-11 DIAGNOSIS — E038 Other specified hypothyroidism: Secondary | ICD-10-CM | POA: Diagnosis not present

## 2015-04-12 LAB — TESTOSTERONE,FREE AND TOTAL
TESTOSTERONE FREE: 8.3 pg/mL (ref 6.8–21.5)
TESTOSTERONE: 346 ng/dL — AB (ref 348–1197)

## 2015-04-13 ENCOUNTER — Telehealth: Payer: Self-pay | Admitting: *Deleted

## 2015-04-13 NOTE — Progress Notes (Signed)
Patient aware.

## 2015-04-13 NOTE — Telephone Encounter (Signed)
-----   Message from Mechele Claude, MD sent at 04/12/2015  6:45 PM EDT ----- Roy [name], Your testosterone level has improved. It is almost back to normal range. Continue as you are and let's check it again in 2 months. I would like to see it come up a little bit higher. If it does not I may just adjust her dose upward at that time. Best Regards, Mechele Claude, M.D.

## 2015-04-14 ENCOUNTER — Ambulatory Visit (INDEPENDENT_AMBULATORY_CARE_PROVIDER_SITE_OTHER): Payer: Medicare Other | Admitting: Family Medicine

## 2015-04-14 ENCOUNTER — Encounter: Payer: Self-pay | Admitting: Family Medicine

## 2015-04-14 VITALS — BP 114/72 | HR 60 | Temp 98.0°F | Ht 68.0 in | Wt 331.2 lb

## 2015-04-14 DIAGNOSIS — E038 Other specified hypothyroidism: Secondary | ICD-10-CM

## 2015-04-14 DIAGNOSIS — R1032 Left lower quadrant pain: Secondary | ICD-10-CM

## 2015-04-14 DIAGNOSIS — E785 Hyperlipidemia, unspecified: Secondary | ICD-10-CM

## 2015-04-14 DIAGNOSIS — I10 Essential (primary) hypertension: Secondary | ICD-10-CM | POA: Diagnosis not present

## 2015-04-14 MED ORDER — LINACLOTIDE 290 MCG PO CAPS: 290.0000 ug | ORAL_CAPSULE | Freq: Every day | ORAL | Status: DC

## 2015-04-14 NOTE — Progress Notes (Signed)
Subjective:  Patient ID: Roy [name], male    DOB: Mar 06, 1971  Age: 44 y.o. MRN: [id number]  CC: Hypertension; Hyperlipidemia; and Hypothyroidism   HPI Roy [name] presents for LLQ pain at groin. This is been ongoing for several days. He is concerned about a recurrence of his diverticulitis. However he points to the crease between the groin and the superomedial thigh.   follow-up of hypertension. Patient has no history of headache chest pain or shortness of breath or recent cough. Patient also denies symptoms of TIA such as numbness weakness lateralizing. Patient checks  blood pressure at home and has not had any elevated readings recently. Patient denies side effects from his medication. States taking it regularly. Patient presents for follow-up on  thyroid. She has a history of hypothyroidism for many years. It has been stable recently. Pt. denies any change in  voice, loss of hair, heat or cold intolerance. Energy level has been adequate to good. She denies constipation and diarrhea. No myxedema. Medication is as noted below. Verified that pt is taking it daily on an empty stomach. Well tolerated. Patient in for follow-up of elevated cholesterol. Doing well without complaints on current medication. Denies side effects of statin including myalgia and arthralgia and nausea. Also in today for liver function testing. Currently no chest pain, shortness of breath or other cardiovascular related symptoms noted.  History Aren has a past medical history of Ulnar neuropathy at elbow; Radiculopathy of cervical region; Median nerve dysfunction; Weakness; Anxiety; and Depression.   He has past surgical history that includes Rotator cuff repair (Right); Biceps tendon repair; and Neck surgery.   His family history includes Arrhythmia in his father; Hypertension in his mother.He reports that he has been smoking Cigarettes.  He has been smoking about 0.50 packs per day. He has quit using smokeless  tobacco. His smokeless tobacco use included Snuff and Chew. He reports that he does not drink alcohol or use illicit drugs.  Outpatient Prescriptions Prior to Visit  Medication Sig Dispense Refill  . ALPRAZolam (XANAX) 0.5 MG tablet TAKE ONE TABLET BY MOUTH 4 TIMES DAILY AS NEEDED 120 tablet 0  . aspirin EC 81 MG tablet Take 81 mg by mouth daily.    . Aspirin-Salicylamide-Caffeine (BC HEADACHE POWDER PO) Take 1 Package by mouth daily as needed (for pain).    . carvedilol (COREG) 12.5 MG tablet TAKE 0.5 TABLETS (6.25 MG TOTAL) BY MOUTH 2 TIMES DAILY WITH A MEAL FOR BLOOD PRESSURE/HEART/KIDNEY 60 tablet 3  . DULoxetine (CYMBALTA) 30 MG capsule Take 3 capsules (90 mg total) by mouth daily. 90 capsule 5  . Flaxseed, Linseed, (FLAXSEED OIL) 1200 MG CAPS Take 1 capsule by mouth 2 (two) times daily.    Marland Kitchen gabapentin (NEURONTIN) 300 MG capsule Take 1 capsule (300 mg total) by mouth 3 (three) times daily. 90 capsule 1  . Garlic 8546 MG CAPS Take 1 capsule by mouth daily.    Marland Kitchen gemfibrozil (LOPID) 600 MG tablet Take 1 tablet (600 mg total) by mouth 2 (two) times daily before a meal. TAKE ONE TABLET BY MOUTH TWICE DAILY 60 tablet 2  . levothyroxine (SYNTHROID, LEVOTHROID) 50 MCG tablet TAKE ONE TABLET BY MOUTH ONCE DAILY 30 tablet 10  . Magnesium 250 MG TABS Take 1 tablet by mouth daily.    . meloxicam (MOBIC) 15 MG tablet TAKE ONE TABLET BY MOUTH ONCE DAILY 30 tablet 2  . Omega-3 Fatty Acids (FISH OIL) 1000 MG CAPS Take 2 capsules by mouth  daily.    . pantoprazole (PROTONIX) 40 MG tablet Take 1 tablet (40 mg total) by mouth daily. 30 tablet 3  . Testosterone (ANDROGEL PUMP) 20.25 MG/ACT (1.62%) GEL Apply 4 pumps daily to upper chest and shoulders 300 g 5  . traZODone (DESYREL) 100 MG tablet Take 1 tablet (100 mg total) by mouth at bedtime. 30 tablet 2  . betamethasone dipropionate (DIPROLENE) 0.05 % cream Apply topically 2 (two) times daily. (Patient not taking: Reported on 04/14/2015) 30 g 0   No  facility-administered medications prior to visit.    ROS Review of Systems  Constitutional: Negative for fever, chills and diaphoresis.  Respiratory: Negative for cough and shortness of breath.   Cardiovascular: Negative for chest pain.  Gastrointestinal: Positive for abdominal pain. Negative for nausea, vomiting, diarrhea, constipation, blood in stool and abdominal distention.  Genitourinary: Negative for dysuria, hematuria and flank pain.  Musculoskeletal: Positive for myalgias. Negative for joint swelling and arthralgias.  Skin: Negative for rash.  Neurological: Negative for dizziness and weakness.  Psychiatric/Behavioral: The patient is not nervous/anxious.     Objective:  BP 114/72 mmHg  Pulse 60  Temp(Src) 98 F (36.7 C) (Oral)  Ht _0  (1.727 m)  Wt 331 lb 3.2 oz (150.231 kg)  BMI 50.37 kg/m2  BP Readings from Last 3 Encounters:  04/14/15 114/72  01/12/15 124/76  12/19/14 139/81    Wt Readings from Last 3 Encounters:  04/14/15 331 lb 3.2 oz (150.231 kg)  01/12/15 328 lb 6.4 oz (148.961 kg)  12/19/14 333 lb (151.048 kg)     Physical Exam  Constitutional: He is oriented to person, place, and time. He appears well-developed and well-nourished. No distress.  HENT:  Head: Normocephalic and atraumatic.  Right Ear: External ear normal.  Left Ear: External ear normal.  Nose: Nose normal.  Mouth/Throat: Oropharynx is clear and moist.  Eyes: Conjunctivae and EOM are normal. Pupils are equal, round, and reactive to light.  Neck: Normal range of motion. Neck supple. No thyromegaly present.  Cardiovascular: Normal rate, regular rhythm and normal heart sounds.   No murmur heard. Pulmonary/Chest: Effort normal and breath sounds normal. No respiratory distress. He has no wheezes. He has no rales.  Abdominal: Soft. Bowel sounds are normal. He exhibits no distension. There is tenderness (the tenderness is at the origin of the abductor musculature of the thigh.).    Lymphadenopathy:    He has no cervical adenopathy.  Neurological: He is alert and oriented to person, place, and time. He has normal reflexes.  Skin: Skin is warm and dry.  Psychiatric: He has a normal mood and affect. His behavior is normal. Judgment and thought content normal.    Lab Results  Component Value Date   HGBA1C 5.8% 11/17/2014    Lab Results  Component Value Date   WBC 5.5 11/17/2014   HGB 13.0 11/17/2014   HCT 38.5 11/17/2014   PLT 274 11/17/2014   GLUCOSE 101* 11/17/2014   CHOL 162 11/17/2014   TRIG 173* 11/17/2014   HDL 35* 11/17/2014   ALT 12 11/17/2014   AST 16 11/17/2014   NA 133* 11/17/2014   K 4.4 11/17/2014   CL 94* 11/17/2014   CREATININE 1.06 11/17/2014   BUN 17 11/17/2014   CO2 22 11/17/2014   TSH 2.670 04/11/2015   PSA 0.3 11/17/2014   HGBA1C 5.8% 11/17/2014    Mr Cervical Spine Wo Contrast  03/04/2015   CLINICAL DATA:  Neck in right shoulder pain for 6 months.  Prior cervical spine surgery.  EXAM: MRI CERVICAL SPINE WITHOUT CONTRAST  TECHNIQUE: Multiplanar, multisequence MR imaging of the cervical spine was performed. No intravenous contrast was administered.  COMPARISON:  09/24/2005  FINDINGS: The cervical cord is normal in size and signal. Vertebral body heights are maintained. There is anterior cervical fusion from C3 through C5 with solid osseous fusion across the disc spaces. There is degenerative disc disease at C6-7. The cervical spine is normal in lordotic alignment. No static listhesis. Bone marrow signal is normal. Cerebellar tonsils are normal in position.  C2-3: No significant disc bulge. No neural foraminal stenosis. No central canal stenosis.  C3-4: Interbody fusion. No neural foraminal stenosis. No central canal stenosis.  C4-5: Interbody fusion. No right foraminal stenosis. Mild left foraminal stenosis. No central canal stenosis.  C5-6: No significant disc bulge. No neural foraminal stenosis. No central canal stenosis.  C6-7: Mild  broad-based disc bulge. Moderate right uncovertebral degenerative changes resulting in moderate -severe right foraminal stenosis. Mild left foraminal stenosis. No central canal stenosis.  C7-T1: No significant disc bulge. No neural foraminal stenosis. No central canal stenosis.  IMPRESSION: 1. Anterior cervical fusion at C3-4 and C4-5 with solid osseous fusion across the disc spaces. Mild left foraminal stenosis at C4-5. Otherwise no significant foraminal or central canal stenosis. 2. At C6-7 there is a mild broad-based disc bulge with moderate right uncovertebral degenerative changes resulting in moderate -severe right foraminal stenosis.   Electronically Signed   By: Kathreen Devoid   On: 03/04/2015 11:22    Assessment & Plan:   Roy was seen today for hypertension, hyperlipidemia and hypothyroidism.  Diagnoses and all orders for this visit:  Other specified hypothyroidism Orders: -     Cancel: TSH -     Cancel: T4, free  Essential hypertension Orders: -     POCT CBC; Standing -     CMP14+EGFR; Standing  Hyperlipemia Orders: -     CMP14+EGFR; Standing -     Lipid panel; Standing  Abdominal pain, left lower quadrant  Other orders -     Linaclotide (LINZESS) 290 MCG CAPS capsule; Take 1 capsule (290 mcg total) by mouth daily.   I have discontinued Mr. Klem betamethasone dipropionate. I am also having him start on Linaclotide. Additionally, I am having him maintain his Flaxseed Oil, Garlic, aspirin EC, Fish Oil, Magnesium, Aspirin-Salicylamide-Caffeine (BC HEADACHE POWDER PO), DULoxetine, Testosterone, meloxicam, levothyroxine, gabapentin, traZODone, pantoprazole, carvedilol, gemfibrozil, and ALPRAZolam.  Meds ordered this encounter  Medications  . Linaclotide (LINZESS) 290 MCG CAPS capsule    Sig: Take 1 capsule (290 mcg total) by mouth daily.    Dispense:  30 capsule    Refill:  2     Follow-up: Return in about 3 months (around 07/15/2015), or if symptoms worsen or fail  to improve.  Claretta Fraise, M.D.

## 2015-04-15 LAB — SPECIMEN STATUS REPORT

## 2015-04-15 LAB — T4, FREE: Free T4: 1.01 ng/dL (ref 0.82–1.77)

## 2015-04-15 LAB — TSH: TSH: 2.67 u[IU]/mL (ref 0.450–4.500)

## 2015-04-18 DIAGNOSIS — K5900 Constipation, unspecified: Secondary | ICD-10-CM | POA: Diagnosis not present

## 2015-04-18 DIAGNOSIS — R151 Fecal smearing: Secondary | ICD-10-CM | POA: Diagnosis not present

## 2015-04-20 ENCOUNTER — Telehealth: Payer: Self-pay | Admitting: Family Medicine

## 2015-04-20 NOTE — Telephone Encounter (Signed)
LM

## 2015-04-26 DIAGNOSIS — K5900 Constipation, unspecified: Secondary | ICD-10-CM | POA: Diagnosis not present

## 2015-04-26 DIAGNOSIS — K641 Second degree hemorrhoids: Secondary | ICD-10-CM | POA: Diagnosis not present

## 2015-04-27 NOTE — Telephone Encounter (Signed)
Second attempt to call patient back.  Left message for patient to call back if he still has questions regarding BP medication.

## 2015-04-28 ENCOUNTER — Other Ambulatory Visit: Payer: Self-pay | Admitting: Family Medicine

## 2015-04-28 ENCOUNTER — Telehealth: Payer: Self-pay | Admitting: Family Medicine

## 2015-04-28 NOTE — Telephone Encounter (Signed)
Okay to reorder up to 6 months in refills.

## 2015-04-28 NOTE — Telephone Encounter (Signed)
Rx For Xanax called into [facility] Okayed per Dr Darlyn Read

## 2015-04-28 NOTE — Telephone Encounter (Signed)
Called in prescription and pt was notified.

## 2015-04-28 NOTE — Telephone Encounter (Signed)
Last filled 03/29/15, last seen 04/14/15. Call into [facility]

## 2015-04-28 NOTE — Telephone Encounter (Signed)
Please review and advise.

## 2015-05-12 ENCOUNTER — Other Ambulatory Visit: Payer: Self-pay | Admitting: Family Medicine

## 2015-05-17 DIAGNOSIS — K641 Second degree hemorrhoids: Secondary | ICD-10-CM | POA: Diagnosis not present

## 2015-05-22 DIAGNOSIS — M75121 Complete rotator cuff tear or rupture of right shoulder, not specified as traumatic: Secondary | ICD-10-CM | POA: Diagnosis not present

## 2015-06-13 ENCOUNTER — Other Ambulatory Visit: Payer: Self-pay | Admitting: Family Medicine

## 2015-06-16 ENCOUNTER — Other Ambulatory Visit: Payer: Self-pay | Admitting: Family Medicine

## 2015-06-19 ENCOUNTER — Telehealth: Payer: Self-pay | Admitting: Family Medicine

## 2015-06-19 MED ORDER — CARVEDILOL 12.5 MG PO TABS: 12.5000 mg | ORAL_TABLET | Freq: Two times a day (BID) | ORAL | Status: DC

## 2015-06-19 NOTE — Telephone Encounter (Signed)
Patient aware and rx sent to pharmacy.  

## 2015-06-19 NOTE — Telephone Encounter (Signed)
I agree - send in 6 months worth of refills for him thanks, WS.

## 2015-06-26 ENCOUNTER — Other Ambulatory Visit: Payer: Self-pay | Admitting: Family Medicine

## 2015-06-27 ENCOUNTER — Other Ambulatory Visit: Payer: Self-pay | Admitting: Family Medicine

## 2015-07-11 ENCOUNTER — Other Ambulatory Visit: Payer: Self-pay | Admitting: Family Medicine

## 2015-07-11 MED ORDER — TRAZODONE HCL 100 MG PO TABS: 100.0000 mg | ORAL_TABLET | Freq: Every day | ORAL | Status: DC

## 2015-07-25 ENCOUNTER — Other Ambulatory Visit: Payer: Self-pay | Admitting: Family Medicine

## 2015-08-10 ENCOUNTER — Other Ambulatory Visit: Payer: Self-pay | Admitting: Family Medicine

## 2015-08-10 NOTE — Telephone Encounter (Signed)
Last seen 04/14/15  Dr Darlyn ReadStacks

## 2015-08-11 ENCOUNTER — Other Ambulatory Visit: Payer: Self-pay | Admitting: Family Medicine

## 2015-08-14 NOTE — Telephone Encounter (Signed)
Last seen 04/14/15  Dr Stacks 

## 2015-08-22 ENCOUNTER — Ambulatory Visit (INDEPENDENT_AMBULATORY_CARE_PROVIDER_SITE_OTHER): Payer: Medicare Other | Admitting: Family Medicine

## 2015-08-22 ENCOUNTER — Encounter: Payer: Self-pay | Admitting: Family Medicine

## 2015-08-22 VITALS — BP 126/81 | HR 58 | Temp 98.0°F | Ht 68.0 in | Wt 346.0 lb

## 2015-08-22 DIAGNOSIS — J209 Acute bronchitis, unspecified: Secondary | ICD-10-CM

## 2015-08-22 MED ORDER — BENZONATATE 100 MG PO CAPS: 100.0000 mg | ORAL_CAPSULE | Freq: Two times a day (BID) | ORAL | Status: DC | PRN

## 2015-08-22 MED ORDER — AZITHROMYCIN 250 MG PO TABS: ORAL_TABLET | ORAL | Status: DC

## 2015-08-22 NOTE — Patient Instructions (Signed)
Great to meet you!  Come back if you get worse or do not get better as expected

## 2015-08-22 NOTE — Progress Notes (Signed)
   HPI  Patient presents today here with acute illness.  Patient explains that for the last week he's had cough, congestion, and wheezing. He complains of nonproductive cough He denies dyspnea. He is a smoker but does not have a chronic cough. He has malaise, denies fevers  PMH: Smoking status noted ROS: Per HPI  Objective: BP 126/81 mmHg  Pulse 58  Temp(Src) 98 F (36.7 C) (Oral)  Ht 5\' 8"  (1.727 m)  Wt 346 lb (156.945 kg)  BMI 52.62 kg/m2 Gen: NAD, alert, cooperative with exam HEENT: NCAT, oropharynx clear, TMs normal bilaterally, some swelling in the nose nares bilaterally CV: RRR, good S1/S2, no murmur Resp: Nonlabored, very mild expiratory wheezes Ext: No edema, warm Neuro: Alert and oriented, No gross deficits  Assessment and plan:  # Acute bronchitis One week of illness that is steadily worsening and a smoker, will go ahead and treat with azithromycin Also given Tessalon Perles No need for prednisone  Reason for return reviewed   Meds ordered this encounter  Medications  . azithromycin (ZITHROMAX) 250 MG tablet    Sig: Take 2 tablets on day 1 and 1 tablet daily after that    Dispense:  6 tablet    Refill:  0  . benzonatate (TESSALON) 100 MG capsule    Sig: Take 1 capsule (100 mg total) by mouth 2 (two) times daily as needed for cough.    Dispense:  20 capsule    Refill:  0    Murtis SinkSam Vermelle Cammarata, MD [facility] 08/22/2015, 12:09 PM

## 2015-08-31 ENCOUNTER — Ambulatory Visit (INDEPENDENT_AMBULATORY_CARE_PROVIDER_SITE_OTHER): Payer: Medicare Other | Admitting: Family Medicine

## 2015-08-31 ENCOUNTER — Encounter: Payer: Self-pay | Admitting: Family Medicine

## 2015-08-31 VITALS — BP 118/75 | HR 61 | Temp 97.2°F | Ht 68.0 in | Wt 344.6 lb

## 2015-08-31 DIAGNOSIS — J189 Pneumonia, unspecified organism: Secondary | ICD-10-CM | POA: Diagnosis not present

## 2015-08-31 MED ORDER — PREDNISONE 20 MG PO TABS: 40.0000 mg | ORAL_TABLET | Freq: Every day | ORAL | Status: DC

## 2015-08-31 MED ORDER — DOXYCYCLINE HYCLATE 100 MG PO TABS: 100.0000 mg | ORAL_TABLET | Freq: Two times a day (BID) | ORAL | Status: DC

## 2015-08-31 NOTE — Progress Notes (Signed)
   HPI  Patient presents today her today to discuss acute illness  He has been ill now for about 2-3 weeks.  He describes cough, interval development of L rib/side pain with cough, dyspnea, malaise.  He took all o f the azithromycin and had improvement in symptoms for a few days but then they returned.  Since that time he developed the left side pain and his cough seems to be worsening. He is a smoker   PMH: Smoking status noted ROS: Per HPI  Objective: BP 118/75 mmHg  Pulse 61  Temp(Src) 97.2 F (36.2 C) (Oral)  Ht 5\' 8"  (1.727 m)  Wt 344 lb 9.6 oz (156.31 kg)  BMI 52.41 kg/m2 Gen: NAD, alert, cooperative with exam HEENT: NCAT, TMs normal bilaterally, nares clear, oropharynx clear CV: RRR, good S1/S2, no murmur Resp: CTABL, no wheezes, non-labored Ext: No edema, warm Neuro: Alert and oriented, No gross deficits  No reproduction of pain with deep palpation of the left-sided rib cage and the area of concern  Assessment and plan:  # Community-acquired pneumonia Clinical diagnosis as chest x-ray is not available in our night clinic. I will go ahead and treat him with doxycycline, he is penicillin allergic and has recently finished a course of azithromycin  I believe that his chest pain is likely pleuritic pain rather than rib or muscular pain from severe coughing. I have also given him a prednisone burst Return to clinic if worsening or does not improve   Meds ordered this encounter  Medications  . predniSONE (DELTASONE) 20 MG tablet    Sig: Take 2 tablets (40 mg total) by mouth daily with breakfast.    Dispense:  10 tablet    Refill:  0  . doxycycline (VIBRA-TABS) 100 MG tablet    Sig: Take 1 tablet (100 mg total) by mouth 2 (two) times daily. 1 po bid    Dispense:  20 tablet    Refill:  0    Murtis SinkSam Bradshaw, MD [facility] 08/31/2015, 5:56 PM

## 2015-08-31 NOTE — Patient Instructions (Signed)
Great to see you!  I think ou have likely developed pneumonia  Take Doxycyline, an antibiotic  Also take prednisone for your cough  Community-Acquired Pneumonia, Adult Pneumonia is an infection of the lungs. There are different types of pneumonia. One type can develop while a person is in a hospital. A different type, called community-acquired pneumonia, develops in people who are not, or have not recently been, in the hospital or other health care facility.  CAUSES Pneumonia may be caused by bacteria, viruses, or funguses. Community-acquired pneumonia is often caused by Streptococcus pneumonia bacteria. These bacteria are often passed from one person to another by breathing in droplets from the cough or sneeze of an infected person. RISK FACTORS The condition is more likely to develop in:  People who havechronic diseases, such as chronic obstructive pulmonary disease (COPD), asthma, congestive heart failure, cystic fibrosis, diabetes, or kidney disease.  People who haveearly-stage or late-stage HIV.  People who havesickle cell disease.  People who havehad their spleen removed (splenectomy).  People who havepoor Administrator.  People who havemedical conditions that increase the risk of breathing in (aspirating) secretions their own mouth and nose.   People who havea weakened immune system (immunocompromised).  People who smoke.  People whotravel to areas where pneumonia-causing germs commonly exist.  People whoare around animal habitats or animals that have pneumonia-causing germs, including birds, bats, rabbits, cats, and farm animals. SYMPTOMS Symptoms of this condition include:  Adry cough.  A wet (productive) cough.  Fever.  Sweating.  Chest pain, especially when breathing deeply or coughing.  Rapid breathing or difficulty breathing.  Shortness of breath.  Shaking chills.  Fatigue.  Muscle aches. DIAGNOSIS Your health care provider will take a  medical history and perform a physical exam. You may also have other tests, including:  Imaging studies of your chest, including X-rays.  Tests to check your blood oxygen level and other blood gases.  Other tests on blood, mucus (sputum), fluid around your lungs (pleural fluid), and urine. If your pneumonia is severe, other tests may be done to identify the specific cause of your illness. TREATMENT The type of treatment that you receive depends on many factors, such as the cause of your pneumonia, the medicines you take, and other medical conditions that you have. For most adults, treatment and recovery from pneumonia may occur at home. In some cases, treatment must happen in a hospital. Treatment may include:  Antibiotic medicines, if the pneumonia was caused by bacteria.  Antiviral medicines, if the pneumonia was caused by a virus.  Medicines that are given by mouth or through an IV tube.  Oxygen.  Respiratory therapy. Although rare, treating severe pneumonia may include:  Mechanical ventilation. This is done if you are not breathing well on your own and you cannot maintain a safe blood oxygen level.  Thoracentesis. This procedureremoves fluid around one lung or both lungs to help you breathe better. HOME CARE INSTRUCTIONS  Take over-the-counter and prescription medicines only as told by your health care provider.  Only takecough medicine if you are losing sleep. Understand that cough medicine can prevent your body's natural ability to remove mucus from your lungs.  If you were prescribed an antibiotic medicine, take it as told by your health care provider. Do not stop taking the antibiotic even if you start to feel better.  Sleep in a semi-upright position at night. Try sleeping in a reclining chair, or place a few pillows under your head.  Do not use tobacco  products, including cigarettes, chewing tobacco, and e-cigarettes. If you need help quitting, ask your health care  provider.  Drink enough water to keep your urine clear or pale yellow. This will help to thin out mucus secretions in your lungs. PREVENTION There are ways that you can decrease your risk of developing community-acquired pneumonia. Consider getting a pneumococcal vaccine if:  You are older than 42 years of age.  You are older than 42 years of age and are undergoing cancer treatment, have chronic lung disease, or have other medical conditions that affect your immune system. Ask your health care provider if this applies to you. There are different types and schedules of pneumococcal vaccines. Ask your health care provider which vaccination option is best for you. You may also prevent community-acquired pneumonia if you take these actions:  Get an influenza vaccine every year. Ask your health care provider which type of influenza vaccine is best for you.  Go to the dentist on a regular basis.  Wash your hands often. Use hand sanitizer if soap and water are not available. SEEK MEDICAL CARE IF:  You have a fever.  You are losing sleep because you cannot control your cough with cough medicine. SEEK IMMEDIATE MEDICAL CARE IF:  You have worsening shortness of breath.  You have increased chest pain.  Your sickness becomes worse, especially if you are an older adult or have a weakened immune system.  You cough up blood.   This information is not intended to replace advice given to you by your health care provider. Make sure you discuss any questions you have with your health care provider.   Document Released: 09/09/2005 Document Revised: 05/31/2015 Document Reviewed: 01/04/2015 Elsevier Interactive Patient Education [employer]2016 [employer] Inc.

## 2015-09-07 ENCOUNTER — Other Ambulatory Visit: Payer: Self-pay | Admitting: Family Medicine

## 2015-09-12 ENCOUNTER — Other Ambulatory Visit: Payer: Self-pay | Admitting: Family Medicine

## 2015-09-18 ENCOUNTER — Other Ambulatory Visit: Payer: Self-pay | Admitting: Family Medicine

## 2015-09-19 ENCOUNTER — Other Ambulatory Visit: Payer: Self-pay | Admitting: Family Medicine

## 2015-09-25 ENCOUNTER — Other Ambulatory Visit: Payer: Self-pay | Admitting: Family Medicine

## 2015-10-09 ENCOUNTER — Other Ambulatory Visit: Payer: Self-pay | Admitting: Family Medicine

## 2015-10-10 NOTE — Telephone Encounter (Signed)
Last seen 08/31/15 Dr Bradshaw 

## 2015-10-19 ENCOUNTER — Ambulatory Visit (INDEPENDENT_AMBULATORY_CARE_PROVIDER_SITE_OTHER): Payer: Medicare Other | Admitting: Family Medicine

## 2015-10-19 ENCOUNTER — Ambulatory Visit (INDEPENDENT_AMBULATORY_CARE_PROVIDER_SITE_OTHER): Payer: Medicare Other

## 2015-10-19 VITALS — BP 129/81 | HR 57 | Temp 97.3°F | Ht 68.0 in | Wt 342.4 lb

## 2015-10-19 DIAGNOSIS — E291 Testicular hypofunction: Secondary | ICD-10-CM | POA: Diagnosis not present

## 2015-10-19 DIAGNOSIS — R0600 Dyspnea, unspecified: Secondary | ICD-10-CM

## 2015-10-19 DIAGNOSIS — I10 Essential (primary) hypertension: Secondary | ICD-10-CM | POA: Diagnosis not present

## 2015-10-19 DIAGNOSIS — E038 Other specified hypothyroidism: Secondary | ICD-10-CM | POA: Diagnosis not present

## 2015-10-19 DIAGNOSIS — R1032 Left lower quadrant pain: Secondary | ICD-10-CM

## 2015-10-19 DIAGNOSIS — E785 Hyperlipidemia, unspecified: Secondary | ICD-10-CM | POA: Diagnosis not present

## 2015-10-19 MED ORDER — DULOXETINE HCL 30 MG PO CPEP: 90.0000 mg | ORAL_CAPSULE | Freq: Every day | ORAL | Status: DC

## 2015-10-19 MED ORDER — TRIAMCINOLONE ACETONIDE 0.1 % MT PSTE: 1.0000 "application " | PASTE | Freq: Two times a day (BID) | OROMUCOSAL | Status: DC

## 2015-10-19 NOTE — Patient Instructions (Signed)
Call GI, Dr. Lottie Mussel to recheck for the sluggish bowels problem.

## 2015-10-19 NOTE — Progress Notes (Signed)
Subjective:  Patient ID: Roy [name], male    DOB: 08-Oct-1970  Age: 45 y.o. MRN: [id number]  CC: Facial Swelling; Shortness of Breath; and Abdominal Pain   HPI Roy [name] presents for dyspneic 5 days. Chest pressure without radiation. Vague, intermittent across upper chest bilaterally. No nausea, diaphoresis or radiation. Was told it was anxiety at the ER. Taking xanax with relief.    History Roy [name] has a past medical history of Ulnar neuropathy at elbow; Radiculopathy of cervical region; Median nerve dysfunction; Weakness; Anxiety; and Depression.   He has past surgical history that includes Rotator cuff repair (Right); Biceps tendon repair; and Neck surgery.   His family history includes Arrhythmia in his father; Hypertension in his mother.He reports that he has been smoking Cigarettes.  He has been smoking about 0.50 packs per day. He has quit using smokeless tobacco. His smokeless tobacco use included Snuff and Chew. He reports that he does not drink alcohol or use illicit drugs.    ROS Review of Systems  Constitutional: Positive for fatigue. Negative for fever, chills, diaphoresis and unexpected weight change.  HENT: Negative for congestion, hearing loss, rhinorrhea and sore throat.   Eyes: Negative for visual disturbance.  Respiratory: Negative for cough and shortness of breath.   Cardiovascular: Negative for chest pain.  Gastrointestinal: Negative for abdominal pain, diarrhea and constipation.  Genitourinary: Negative for dysuria and flank pain.  Musculoskeletal: Negative for joint swelling and arthralgias.  Skin: Negative for rash.  Neurological: Negative for dizziness and headaches.  Psychiatric/Behavioral: Negative for sleep disturbance and dysphoric mood.    Objective:  BP 129/81 mmHg  Pulse 57  Temp(Src) 97.3 F (36.3 C) (Oral)  Ht _0  (1.727 m)  Wt 342 lb 6.4 oz (155.312 kg)  BMI 52.07 kg/m2  SpO2 95%  BP Readings from Last 3 Encounters:    10/19/15 129/81  08/31/15 118/75  08/22/15 126/81    Wt Readings from Last 3 Encounters:  10/19/15 342 lb 6.4 oz (155.312 kg)  08/31/15 344 lb 9.6 oz (156.31 kg)  08/22/15 346 lb (156.945 kg)     Physical Exam  Constitutional: He is oriented to person, place, and time. He appears well-developed and well-nourished. No distress.  HENT:  Head: Normocephalic and atraumatic.  Right Ear: External ear normal.  Left Ear: External ear normal.  Nose: Nose normal.  Mouth/Throat: Oropharynx is clear and moist.  Eyes: Conjunctivae and EOM are normal. Pupils are equal, round, and reactive to light.  Neck: Normal range of motion. Neck supple. No thyromegaly present.  Cardiovascular: Normal rate, regular rhythm and normal heart sounds.   No murmur heard. Pulmonary/Chest: Effort normal and breath sounds normal. No respiratory distress. He has no wheezes. He has no rales.  Abdominal: Soft. Bowel sounds are normal. He exhibits no distension. There is no tenderness.  Lymphadenopathy:    He has no cervical adenopathy.  Neurological: He is alert and oriented to person, place, and time. He has normal reflexes.  Skin: Skin is warm and dry.  Psychiatric: He has a normal mood and affect. His behavior is normal. Judgment and thought content normal.     Lab Results  Component Value Date   WBC 5.5 11/17/2014   HGB 13.0 11/17/2014   HCT 38.5 11/17/2014   PLT 274 11/17/2014   GLUCOSE 101* 11/17/2014   CHOL 162 11/17/2014   TRIG 173* 11/17/2014   HDL 35* 11/17/2014   ALT 12 11/17/2014   AST 16 11/17/2014   NA  133* 11/17/2014   K 4.4 11/17/2014   CL 94* 11/17/2014   CREATININE 1.06 11/17/2014   BUN 17 11/17/2014   CO2 22 11/17/2014   TSH 2.670 04/11/2015   PSA 0.3 11/17/2014   HGBA1C 5.8% 11/17/2014    Mr Cervical Spine Wo Contrast  03/04/2015  CLINICAL DATA:  Neck in right shoulder pain for 6 months. Prior cervical spine surgery. EXAM: MRI CERVICAL SPINE WITHOUT CONTRAST TECHNIQUE:  Multiplanar, multisequence MR imaging of the cervical spine was performed. No intravenous contrast was administered. COMPARISON:  09/24/2005 FINDINGS: The cervical cord is normal in size and signal. Vertebral body heights are maintained. There is anterior cervical fusion from C3 through C5 with solid osseous fusion across the disc spaces. There is degenerative disc disease at C6-7. The cervical spine is normal in lordotic alignment. No static listhesis. Bone marrow signal is normal. Cerebellar tonsils are normal in position. C2-3: No significant disc bulge. No neural foraminal stenosis. No central canal stenosis. C3-4: Interbody fusion. No neural foraminal stenosis. No central canal stenosis. C4-5: Interbody fusion. No right foraminal stenosis. Mild left foraminal stenosis. No central canal stenosis. C5-6: No significant disc bulge. No neural foraminal stenosis. No central canal stenosis. C6-7: Mild broad-based disc bulge. Moderate right uncovertebral degenerative changes resulting in moderate -severe right foraminal stenosis. Mild left foraminal stenosis. No central canal stenosis. C7-T1: No significant disc bulge. No neural foraminal stenosis. No central canal stenosis. IMPRESSION: 1. Anterior cervical fusion at C3-4 and C4-5 with solid osseous fusion across the disc spaces. Mild left foraminal stenosis at C4-5. Otherwise no significant foraminal or central canal stenosis. 2. At C6-7 there is a mild broad-based disc bulge with moderate right uncovertebral degenerative changes resulting in moderate -severe right foraminal stenosis. Electronically Signed   By: Kathreen Devoid   On: 03/04/2015 11:22    Assessment & Plan:   Roy [name] was seen today for facial swelling, shortness of breath and abdominal pain.  Diagnoses and all orders for this visit:  Dyspnea -     DG Chest 2 View; Future -     CBC with Differential/Platelet -     CMP14+EGFR -     D-dimer, quantitative (not at [facility])  Essential hypertension -      CMP14+EGFR  Hyperlipemia -     CMP14+EGFR -     Lipid panel  Abdominal pain, left lower quadrant -     CBC with Differential/Platelet -     CMP14+EGFR  Hypogonadism in male -     Testosterone,Free and Total  Other specified hypothyroidism -     TSH + free T4 -     CMP14+EGFR  Morbid obesity, unspecified obesity type ([place])  Other orders -     triamcinolone (KENALOG) 0.1 % paste; Use as directed 1 application in the mouth or throat 2 (two) times daily. -     DULoxetine (CYMBALTA) 30 MG capsule; Take 3 capsules (90 mg total) by mouth daily with supper.    Obesity likely the cause for dyspnea. Tests pending.   I have discontinued Roy [name] azithromycin, benzonatate, predniSONE, and doxycycline. I have also changed his DULoxetine. Additionally, I am having him start on triamcinolone. Lastly, I am having him maintain his Flaxseed Oil, Garlic, aspirin EC, Fish Oil, Magnesium, Aspirin-Salicylamide-Caffeine (BC HEADACHE POWDER PO), Testosterone, levothyroxine, Linaclotide, ALPRAZolam, carvedilol, pantoprazole, gemfibrozil, meloxicam, gabapentin, and traZODone.  Meds ordered this encounter  Medications  . triamcinolone (KENALOG) 0.1 % paste    Sig: Use as directed 1 application in  the mouth or throat 2 (two) times daily.    Dispense:  5 g    Refill:  12  . DULoxetine (CYMBALTA) 30 MG capsule    Sig: Take 3 capsules (90 mg total) by mouth daily with supper.    Dispense:  90 capsule    Refill:  5     Follow-up: Return in about 1 month (around 11/19/2015) for headache, anxiety, obesity.  Claretta Fraise, M.D.

## 2015-10-20 ENCOUNTER — Other Ambulatory Visit: Payer: Medicare Other

## 2015-10-20 DIAGNOSIS — I10 Essential (primary) hypertension: Secondary | ICD-10-CM | POA: Diagnosis not present

## 2015-10-20 DIAGNOSIS — E291 Testicular hypofunction: Secondary | ICD-10-CM | POA: Diagnosis not present

## 2015-10-20 DIAGNOSIS — R0600 Dyspnea, unspecified: Secondary | ICD-10-CM | POA: Diagnosis not present

## 2015-10-20 DIAGNOSIS — R1032 Left lower quadrant pain: Secondary | ICD-10-CM | POA: Diagnosis not present

## 2015-10-20 DIAGNOSIS — E785 Hyperlipidemia, unspecified: Secondary | ICD-10-CM | POA: Diagnosis not present

## 2015-10-20 DIAGNOSIS — E038 Other specified hypothyroidism: Secondary | ICD-10-CM | POA: Diagnosis not present

## 2015-10-21 LAB — CBC WITH DIFFERENTIAL/PLATELET
BASOS ABS: 0 10*3/uL (ref 0.0–0.2)
BASOS: 1 %
EOS (ABSOLUTE): 0.1 10*3/uL (ref 0.0–0.4)
Eos: 2 %
Hematocrit: 40.3 % (ref 37.5–51.0)
Hemoglobin: 13.8 g/dL (ref 12.6–17.7)
Immature Grans (Abs): 0 10*3/uL (ref 0.0–0.1)
Immature Granulocytes: 0 %
LYMPHS ABS: 2 10*3/uL (ref 0.7–3.1)
LYMPHS: 36 %
MCH: 31.1 pg (ref 26.6–33.0)
MCHC: 34.2 g/dL (ref 31.5–35.7)
MCV: 91 fL (ref 79–97)
MONOS ABS: 0.4 10*3/uL (ref 0.1–0.9)
Monocytes: 7 %
NEUTROS ABS: 2.9 10*3/uL (ref 1.4–7.0)
Neutrophils: 54 %
PLATELETS: 222 10*3/uL (ref 150–379)
RBC: 4.44 x10E6/uL (ref 4.14–5.80)
RDW: 14.8 % (ref 12.3–15.4)
WBC: 5.4 10*3/uL (ref 3.4–10.8)

## 2015-10-21 LAB — TESTOSTERONE,FREE AND TOTAL
TESTOSTERONE FREE: 17.7 pg/mL (ref 6.8–21.5)
Testosterone: 567 ng/dL (ref 348–1197)

## 2015-10-21 LAB — CMP14+EGFR
A/G RATIO: 2 (ref 1.1–2.5)
ALBUMIN: 4.5 g/dL (ref 3.5–5.5)
ALK PHOS: 69 IU/L (ref 39–117)
ALT: 16 IU/L (ref 0–44)
AST: 17 IU/L (ref 0–40)
BUN / CREAT RATIO: 13 (ref 9–20)
BUN: 12 mg/dL (ref 6–24)
Bilirubin Total: <0.2 mg/dL (ref 0.0–1.2)
CALCIUM: 9.6 mg/dL (ref 8.7–10.2)
CO2: 23 mmol/L (ref 18–29)
CREATININE: 0.92 mg/dL (ref 0.76–1.27)
Chloride: 101 mmol/L (ref 96–106)
GFR calc Af Amer: 118 mL/min/{1.73_m2} (ref >59)
GFR, EST NON AFRICAN AMERICAN: 102 mL/min/{1.73_m2} (ref >59)
GLOBULIN, TOTAL: 2.3 g/dL (ref 1.5–4.5)
Glucose: 82 mg/dL (ref 65–99)
POTASSIUM: 4.6 mmol/L (ref 3.5–5.2)
SODIUM: 142 mmol/L (ref 134–144)
Total Protein: 6.8 g/dL (ref 6.0–8.5)

## 2015-10-21 LAB — LIPID PANEL
CHOL/HDL RATIO: 4.4 ratio (ref 0.0–5.0)
Cholesterol, Total: 159 mg/dL (ref 100–199)
HDL: 36 mg/dL — ABNORMAL LOW (ref >39)
LDL CALC: 103 mg/dL — AB (ref 0–99)
Triglycerides: 98 mg/dL (ref 0–149)
VLDL Cholesterol Cal: 20 mg/dL (ref 5–40)

## 2015-10-21 LAB — TSH+FREE T4
FREE T4: 0.92 ng/dL (ref 0.82–1.77)
TSH: 1.84 u[IU]/mL (ref 0.450–4.500)

## 2015-10-21 LAB — D-DIMER, QUANTITATIVE (NOT AT ARMC): D-DIMER: 0.48 mg{FEU}/L (ref 0.00–0.49)

## 2015-10-22 ENCOUNTER — Other Ambulatory Visit: Payer: Self-pay | Admitting: Family Medicine

## 2015-10-24 ENCOUNTER — Other Ambulatory Visit: Payer: Self-pay | Admitting: Family Medicine

## 2015-10-25 NOTE — Telephone Encounter (Signed)
Please review and advise.

## 2015-10-25 NOTE — Telephone Encounter (Signed)
RX for Xanax called into [facility] Okayed per Dr Stacks 

## 2015-10-25 NOTE — Telephone Encounter (Signed)
Last seen 10/19/15  Dr Darlyn Read  If approved route to nurse to call into [facility]

## 2015-11-08 ENCOUNTER — Encounter: Payer: Self-pay | Admitting: Family Medicine

## 2015-11-08 ENCOUNTER — Ambulatory Visit (INDEPENDENT_AMBULATORY_CARE_PROVIDER_SITE_OTHER): Payer: Medicare Other | Admitting: Family Medicine

## 2015-11-08 VITALS — BP 121/71 | HR 64 | Temp 98.3°F | Ht 68.0 in | Wt 349.0 lb

## 2015-11-08 DIAGNOSIS — B356 Tinea cruris: Secondary | ICD-10-CM

## 2015-11-08 DIAGNOSIS — K1379 Other lesions of oral mucosa: Secondary | ICD-10-CM

## 2015-11-08 MED ORDER — FLUCONAZOLE 100 MG PO TABS: 100.0000 mg | ORAL_TABLET | Freq: Every day | ORAL | Status: DC

## 2015-11-08 NOTE — Progress Notes (Signed)
Subjective:  Patient ID: KINGSLEE MAIRENA, male    DOB: 22-Apr-1971  Age: 45 y.o. MRN: [id number]  CC: Oral Pain   HPI RINALDO MACQUEEN presents for No relief with previous prescription. Still swollen. Now points to the back of the upperincisors st the gums. Says his lower teeth cut into it.  Also still having rash at right groin. Itches, spreading onto R inner proximal thigh.    History Kayode has a past medical history of Ulnar neuropathy at elbow; Radiculopathy of cervical region; Median nerve dysfunction; Weakness; Anxiety; and Depression.   He has past surgical history that includes Rotator cuff repair (Right); Biceps tendon repair; and Neck surgery.   His family history includes Arrhythmia in his father; Hypertension in his mother.He reports that he has been smoking Cigarettes.  He has been smoking about 0.50 packs per day. He has quit using smokeless tobacco. His smokeless tobacco use included Snuff and Chew. He reports that he does not drink alcohol or use illicit drugs.    ROS Review of Systems  Constitutional: Negative for fever, chills and diaphoresis.  HENT: Negative for rhinorrhea and sore throat.   Respiratory: Negative for cough and shortness of breath.   Cardiovascular: Negative for chest pain.  Gastrointestinal: Negative for abdominal pain.  Musculoskeletal: Negative for myalgias and arthralgias.  Skin: Negative for rash.  Neurological: Negative for weakness and headaches.    Objective:  BP 121/71 mmHg  Pulse 64  Temp(Src) 98.3 F (36.8 C) (Oral)  Ht  (1.727 m)  Wt 349 lb (158.305 kg)  BMI 53.08 kg/m2  SpO2 97%  BP Readings from Last 3 Encounters:  11/08/15 121/71  10/19/15 129/81  08/31/15 118/75    Wt Readings from Last 3 Encounters:  11/08/15 349 lb (158.305 kg)  10/19/15 342 lb 6.4 oz (155.312 kg)  08/31/15 344 lb 9.6 oz (156.31 kg)     Physical Exam  Constitutional: He appears well-developed and well-nourished.  HENT:  Head:  Normocephalic and atraumatic.  Right Ear: External ear normal.  Left Ear: External ear normal.  Mouth/Throat: Mucous membranes are normal. He does not have dentures. No oral lesions. Abnormal dentition. Dental caries present. No dental abscesses or uvula swelling. No oropharyngeal exudate or posterior oropharyngeal erythema.  Eyes: Pupils are equal, round, and reactive to light.  Neck: Normal range of motion. Neck supple.  Cardiovascular: Normal rate and regular rhythm.   No murmur heard. Pulmonary/Chest: Breath sounds normal. No respiratory distress.  Abdominal: Bowel sounds are normal.  Skin: Rash (right groin - semicircular erythema with clearing center and few satellites. measures 5 inches ) noted.  Vitals reviewed.    Lab Results  Component Value Date   WBC 5.4 10/20/2015   HGB 13.0 11/17/2014   HCT 40.3 10/20/2015   PLT 222 10/20/2015   GLUCOSE 82 10/20/2015   CHOL 159 10/20/2015   TRIG 98 10/20/2015   HDL 36* 10/20/2015   LDLCALC 103* 10/20/2015   ALT 16 10/20/2015   AST 17 10/20/2015   NA 142 10/20/2015   K 4.6 10/20/2015   CL 101 10/20/2015   CREATININE 0.92 10/20/2015   BUN 12 10/20/2015   CO2 23 10/20/2015   TSH 1.840 10/20/2015   PSA 0.3 11/17/2014   HGBA1C 5.8% 11/17/2014    Mr Cervical Spine Wo Contrast  03/04/2015  CLINICAL DATA:  Neck in right shoulder pain for 6 months. Prior cervical spine surgery. EXAM: MRI CERVICAL SPINE WITHOUT CONTRAST TECHNIQUE: Multiplanar, multisequence MR imaging of  the cervical spine was performed. No intravenous contrast was administered. COMPARISON:  09/24/2005 FINDINGS: The cervical cord is normal in size and signal. Vertebral body heights are maintained. There is anterior cervical fusion from C3 through C5 with solid osseous fusion across the disc spaces. There is degenerative disc disease at C6-7. The cervical spine is normal in lordotic alignment. No static listhesis. Bone marrow signal is normal. Cerebellar tonsils are normal  in position. C2-3: No significant disc bulge. No neural foraminal stenosis. No central canal stenosis. C3-4: Interbody fusion. No neural foraminal stenosis. No central canal stenosis. C4-5: Interbody fusion. No right foraminal stenosis. Mild left foraminal stenosis. No central canal stenosis. C5-6: No significant disc bulge. No neural foraminal stenosis. No central canal stenosis. C6-7: Mild broad-based disc bulge. Moderate right uncovertebral degenerative changes resulting in moderate -severe right foraminal stenosis. Mild left foraminal stenosis. No central canal stenosis. C7-T1: No significant disc bulge. No neural foraminal stenosis. No central canal stenosis. IMPRESSION: 1. Anterior cervical fusion at C3-4 and C4-5 with solid osseous fusion across the disc spaces. Mild left foraminal stenosis at C4-5. Otherwise no significant foraminal or central canal stenosis. 2. At C6-7 there is a mild broad-based disc bulge with moderate right uncovertebral degenerative changes resulting in moderate -severe right foraminal stenosis. Electronically Signed   By: Elige Ko   On: 03/04/2015 11:22    Assessment & Plan:   Daylyn was seen today for oral pain.  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 Ingrum triamcinolone. I am also having him maintain his Flaxseed Oil, Garlic, aspirin EC, Fish Oil, Magnesium, Aspirin-Salicylamide-Caffeine (BC HEADACHE POWDER PO), Testosterone, levothyroxine, Linaclotide, carvedilol, meloxicam, gabapentin, traZODone, DULoxetine, gemfibrozil, pantoprazole, and ALPRAZolam.  No orders of the defined types were placed in this encounter.     Follow-up: Return if symptoms worsen or fail to improve.  Mechele Claude, M.D.

## 2015-11-09 ENCOUNTER — Other Ambulatory Visit: Payer: Self-pay | Admitting: Family Medicine

## 2015-11-21 ENCOUNTER — Ambulatory Visit: Payer: Medicare Other | Admitting: Family Medicine

## 2015-11-22 ENCOUNTER — Encounter: Payer: Self-pay | Admitting: Family Medicine

## 2015-12-11 ENCOUNTER — Other Ambulatory Visit: Payer: Self-pay | Admitting: Family Medicine

## 2015-12-20 ENCOUNTER — Encounter (HOSPITAL_COMMUNITY): Payer: Self-pay | Admitting: *Deleted

## 2015-12-20 ENCOUNTER — Emergency Department (HOSPITAL_COMMUNITY): Payer: Medicare Other

## 2015-12-20 ENCOUNTER — Observation Stay (HOSPITAL_COMMUNITY)
Admission: EM | Admit: 2015-12-20 | Discharge: 2015-12-21 | Disposition: A | Discharge status: Home / Self Care | Payer: Medicare Other | Attending: Internal Medicine | Admitting: Internal Medicine

## 2015-12-20 DIAGNOSIS — K219 Gastro-esophageal reflux disease without esophagitis: Secondary | ICD-10-CM | POA: Diagnosis not present

## 2015-12-20 DIAGNOSIS — R0602 Shortness of breath: Secondary | ICD-10-CM | POA: Diagnosis not present

## 2015-12-20 DIAGNOSIS — I10 Essential (primary) hypertension: Secondary | ICD-10-CM

## 2015-12-20 DIAGNOSIS — Z7982 Long term (current) use of aspirin: Secondary | ICD-10-CM | POA: Insufficient documentation

## 2015-12-20 DIAGNOSIS — F419 Anxiety disorder, unspecified: Secondary | ICD-10-CM | POA: Insufficient documentation

## 2015-12-20 DIAGNOSIS — Z6841 Body Mass Index (BMI) 40.0 and over, adult: Secondary | ICD-10-CM | POA: Diagnosis not present

## 2015-12-20 DIAGNOSIS — R0789 Other chest pain: Secondary | ICD-10-CM | POA: Diagnosis not present

## 2015-12-20 DIAGNOSIS — R079 Chest pain, unspecified: Principal | ICD-10-CM | POA: Insufficient documentation

## 2015-12-20 DIAGNOSIS — F17210 Nicotine dependence, cigarettes, uncomplicated: Secondary | ICD-10-CM | POA: Insufficient documentation

## 2015-12-20 DIAGNOSIS — Z79899 Other long term (current) drug therapy: Secondary | ICD-10-CM | POA: Insufficient documentation

## 2015-12-20 DIAGNOSIS — E039 Hypothyroidism, unspecified: Secondary | ICD-10-CM | POA: Diagnosis not present

## 2015-12-20 DIAGNOSIS — Z720 Tobacco use: Secondary | ICD-10-CM

## 2015-12-20 DIAGNOSIS — F329 Major depressive disorder, single episode, unspecified: Secondary | ICD-10-CM | POA: Diagnosis not present

## 2015-12-20 LAB — CBC
HCT: 41.4 % (ref 39.0–52.0)
Hemoglobin: 13.7 g/dL (ref 13.0–17.0)
MCH: 31.1 pg (ref 26.0–34.0)
MCHC: 33.1 g/dL (ref 30.0–36.0)
MCV: 93.9 fL (ref 78.0–100.0)
PLATELETS: 219 10*3/uL (ref 150–400)
RBC: 4.41 MIL/uL (ref 4.22–5.81)
RDW: 13.7 % (ref 11.5–15.5)
WBC: 5.3 10*3/uL (ref 4.0–10.5)

## 2015-12-20 LAB — BASIC METABOLIC PANEL
Anion gap: 11 (ref 5–15)
BUN: 12 mg/dL (ref 6–20)
CALCIUM: 9.2 mg/dL (ref 8.9–10.3)
CO2: 25 mmol/L (ref 22–32)
CREATININE: 0.87 mg/dL (ref 0.61–1.24)
Chloride: 103 mmol/L (ref 101–111)
Glucose, Bld: 115 mg/dL — ABNORMAL HIGH (ref 65–99)
Potassium: 3.8 mmol/L (ref 3.5–5.1)
SODIUM: 139 mmol/L (ref 135–145)

## 2015-12-20 LAB — I-STAT TROPONIN, ED: Troponin i, poc: 0.06 ng/mL (ref 0.00–0.08)

## 2015-12-20 LAB — TROPONIN I

## 2015-12-20 MED ORDER — PANTOPRAZOLE SODIUM 40 MG PO TBEC
40.0000 mg | DELAYED_RELEASE_TABLET | Freq: Once | ORAL | Status: AC
Start: 2015-12-20 — End: 2015-12-20
  Administered 2015-12-20: 40 mg via ORAL
  Filled 2015-12-20: qty 1

## 2015-12-20 MED ORDER — GI COCKTAIL ~~LOC~~
30.0000 mL | Freq: Once | ORAL | Status: AC
  Administered 2015-12-20: 30 mL via ORAL
  Filled 2015-12-20: qty 30

## 2015-12-20 NOTE — ED Provider Notes (Signed)
By signing my name below, I, Freida Busman, attest that this documentation has been prepared under the direction and in the presence of Kristen N Ward, DO . Electronically Signed: Freida Busman, Scribe. 12/20/2015. 11:17 PM.  TIME SEEN: 11:07 PM  CHIEF COMPLAINT:  Chief Complaint  Patient presents with  . Chest Pain    HPI:   Roy [name] is a 45 y.o. male with a history of HTN and HLD, who presents to the Emergency Department complaining of constant, waxing and waning, central/left lower CP x  2- 3 days. Pt describes his pain as a pressure and notes occasional burning sensation. Pt reports h/o GERD and stomach ulcers but cannot say if pain today is similar, however notes he has been taking BC powder for HA. He denies bitter taste in mouth, bloody stools, nausea, vomiting and diaphoresis. States he has had some mild shortness of breath. No alleviating factors noted for his CP. Pt is a current everyday smoker.  Last stress test was 12/06/2014 and was normal with ejection fraction of 53%.   Cardiologist is Dr. Melburn Popper.  [facility]- [facility] Stacks- PCP  ROS: See HPI Constitutional: no fever  Eyes: no drainage  ENT: no runny nose   Cardiovascular:  chest pain  Resp: no SOB  GI: no vomiting GU: no dysuria Integumentary: no rash  Allergy: no hives  Musculoskeletal: no leg swelling  Neurological: no slurred speech ROS otherwise negative  PAST MEDICAL HISTORY/PAST SURGICAL HISTORY:  Past Medical History  Diagnosis Date  . Ulnar neuropathy at elbow   . Radiculopathy of cervical region   . Median nerve dysfunction   . Weakness   . Anxiety   . Depression     MEDICATIONS:  Prior to Admission medications   Medication Sig Start Date End Date Taking? Authorizing Provider  ALPRAZolam Prudy Feeler) 0.5 MG tablet TAKE ONE TABLET BY MOUTH 4 TIMES DAILY 10/25/15   Mechele Claude, MD  aspirin EC 81 MG tablet Take 81 mg by mouth daily.    Historical Provider, MD   Aspirin-Salicylamide-Caffeine (BC HEADACHE POWDER PO) Take 1 Package by mouth daily as needed (for pain).    Historical Provider, MD  carvedilol (COREG) 12.5 MG tablet Take 1 tablet (12.5 mg total) by mouth 2 (two) times daily with a meal. 06/19/15   Mechele Claude, MD  DULoxetine (CYMBALTA) 30 MG capsule Take 3 capsules (90 mg total) by mouth daily with supper. 10/19/15   Mechele Claude, MD  DULoxetine (CYMBALTA) 30 MG capsule TAKE TWO CAPSULES BY MOUTH ONCE DAILY WITH  SUPPER 12/11/15   Mechele Claude, MD  Flaxseed, Linseed, (FLAXSEED OIL) 1200 MG CAPS Take 1 capsule by mouth 2 (two) times daily.    Historical Provider, MD  fluconazole (DIFLUCAN) 100 MG tablet Take 1 tablet (100 mg total) by mouth daily. 11/08/15   Mechele Claude, MD  gabapentin (NEURONTIN) 300 MG capsule TAKE ONE CAPSULE BY MOUTH THREE TIMES DAILY 11/10/15   Mechele Claude, MD  Garlic 1000 MG CAPS Take 1 capsule by mouth daily.    Historical Provider, MD  gemfibrozil (LOPID) 600 MG tablet TAKE ONE TABLET BY MOUTH TWICE DAILY BEFORE A MEAL 10/23/15   Mechele Claude, MD  levothyroxine (SYNTHROID, LEVOTHROID) 50 MCG tablet TAKE ONE TABLET BY MOUTH ONCE DAILY 02/09/15   Frederica Kuster, MD  Linaclotide [facility]) 290 MCG CAPS capsule Take 1 capsule (290 mcg total) by mouth daily. 04/14/15   Mechele Claude, MD  Magnesium 250 MG TABS Take 1 tablet by mouth  daily.    Historical Provider, MD  meloxicam (MOBIC) 15 MG tablet TAKE ONE TABLET BY MOUTH ONCE DAILY 12/11/15   Mechele ClaudeWarren Stacks, MD  Omega-3 Fatty Acids (FISH OIL) 1000 MG CAPS Take 2 capsules by mouth daily.    Historical Provider, MD  pantoprazole (PROTONIX) 40 MG tablet TAKE ONE TABLET BY MOUTH ONCE DAILY 10/23/15   Mechele ClaudeWarren Stacks, MD  Testosterone (ANDROGEL PUMP) 20.25 MG/ACT (1.62%) GEL Apply 4 pumps daily to upper chest and shoulders 01/12/15   Mechele ClaudeWarren Stacks, MD  traZODone (DESYREL) 100 MG tablet TAKE ONE TABLET BY MOUTH ONCE DAILY AT BEDTIME 10/10/15   Elenora GammaSamuel L Bradshaw, MD    ALLERGIES:   Allergies  Allergen Reactions  . Penicillins Rash    SOCIAL HISTORY:  Social History  Substance Use Topics  . Smoking status: Current Every Day Smoker -- 0.50 packs/day    Types: Cigarettes  . Smokeless tobacco: Former NeurosurgeonUser    Types: Snuff, Chew  . Alcohol Use: No    FAMILY HISTORY: Family History  Problem Relation Age of Onset  . Hypertension Mother   . Arrhythmia Father     EXAM: BP 110/69 mmHg  Pulse 57  Temp(Src) 98 F (36.7 C) (Oral)  Resp 14  SpO2 96% CONSTITUTIONAL: Alert and oriented and responds appropriately to questions. Well-appearing; Morbidly obese HEAD: Normocephalic EYES: Conjunctivae clear, PERRL ENT: normal nose; no rhinorrhea; moist mucous membranes NECK: Supple, no meningismus, no LAD  CARD: RRR; S1 and S2 appreciated; no murmurs, no clicks, no rubs, no gallops CHEST: TTP over left chest wall without crepitus or deformity But states that this does not reproduce his pain RESP: Normal chest excursion without splinting or tachypnea; breath sounds clear and equal bilaterally; no wheezes, no rhonchi, no rales, no hypoxia or respiratory distress, speaking full sentences ABD/GI: Normal bowel sounds; non-distended; soft, non-tender, no rebound, no guarding, no peritoneal signs BACK:  The back appears normal and is non-tender to palpation, there is no CVA tenderness EXT: Normal ROM in all joints; non-tender to palpation; no edema; normal capillary refill; no cyanosis, no calf tenderness or swelling    SKIN: Normal color for age and race; warm; no rash NEURO: Moves all extremities equally, sensation to light touch intact diffusely, cranial nerves II through XII intact PSYCH: The patient's mood and manner are appropriate. Grooming and personal hygiene are appropriate.   EKG Interpretation  Date/Time:  Wednesday December 20 2015 18:08:33 EDT Ventricular Rate:  60 PR Interval:  148 QRS Duration: 88 QT Interval:  428 QTC Calculation: 428 R Axis:   78 Text  Interpretation:  Normal sinus rhythm Normal ECG No significant change since last tracing Confirmed by Denton LankSTEINL  MD, Caryn BeeKEVIN ([id number]) on 12/20/2015 11:03:43 PM       MEDICAL DECISION MAKING: Patient here with chest pain. Suspect musculoskeletal versus GERD, gastritis.  He does have risk factors for ACS.  He is not sure if pain is exertional. Does not change with food. EKG shows no ischemic abnormality and his first troponin is negative. Chest x-ray is clear. He is hemodynamically stable. Will treat with GI cocktail, Protonix and reassess. We'll repeat a second troponin. He does have a cardiologist and PCP.  ED PROGRESS: Patient has had no improvement in pain with GI cocktail and Protonix. Still having chest pressure. Will give aspirin and nitroglycerin. His repeat troponin is 0.06. Given this is elevated in 2 hours from 0.00, I do feel this time he should be admitted for observation for chest pain rule  out given he does have multiple risk factors. Discussed this with patient who agrees. We'll discuss with medicine on-call.   12:30 AM  Discussed patient's case with hospitalist, Dr. Maryfrances Bunnell.  Recommend admission to telemetry, observation bed.  I will place holding orders per their request. Patient and family (if present) updated with plan. Care transferred to hospitalist service.   I personally performed the services described in this documentation, which was scribed in my presence. The recorded information has been reviewed and is accurate.    Layla Maw Ward, DO 12/21/15 [phone]

## 2015-12-20 NOTE — ED Notes (Signed)
The pt is c/o chest pain with sl sob for 2-3 days and both legs hurt  Some sl sob.    No cardiac history

## 2015-12-20 NOTE — ED Notes (Signed)
Pt complains of stomach issues. Takes multiple BC for headaches. States he is unsure of pain origiantion.

## 2015-12-21 ENCOUNTER — Observation Stay (HOSPITAL_BASED_OUTPATIENT_CLINIC_OR_DEPARTMENT_OTHER): Payer: Medicare Other

## 2015-12-21 ENCOUNTER — Encounter (HOSPITAL_COMMUNITY): Payer: Self-pay | Admitting: Family Medicine

## 2015-12-21 DIAGNOSIS — E039 Hypothyroidism, unspecified: Secondary | ICD-10-CM

## 2015-12-21 DIAGNOSIS — F1910 Other psychoactive substance abuse, uncomplicated: Secondary | ICD-10-CM

## 2015-12-21 DIAGNOSIS — R079 Chest pain, unspecified: Secondary | ICD-10-CM

## 2015-12-21 DIAGNOSIS — Z720 Tobacco use: Secondary | ICD-10-CM

## 2015-12-21 DIAGNOSIS — I10 Essential (primary) hypertension: Secondary | ICD-10-CM

## 2015-12-21 LAB — TROPONIN I

## 2015-12-21 LAB — LIPASE, BLOOD: Lipase: 46 U/L (ref 11–51)

## 2015-12-21 LAB — ECHOCARDIOGRAM COMPLETE

## 2015-12-21 MED ORDER — ONDANSETRON HCL 4 MG/2ML IJ SOLN: 4.0000 mg | Freq: Four times a day (QID) | INTRAMUSCULAR | Status: DC | PRN

## 2015-12-21 MED ORDER — GABAPENTIN 300 MG PO CAPS
300.0000 mg | ORAL_CAPSULE | Freq: Two times a day (BID) | ORAL | Status: DC
  Administered 2015-12-21: 300 mg via ORAL
  Filled 2015-12-21: qty 1

## 2015-12-21 MED ORDER — PANTOPRAZOLE SODIUM 40 MG PO TBEC
40.0000 mg | DELAYED_RELEASE_TABLET | Freq: Every day | ORAL | Status: DC
  Administered 2015-12-21: 40 mg via ORAL
  Filled 2015-12-21: qty 1

## 2015-12-21 MED ORDER — DULOXETINE HCL 30 MG PO CPEP
30.0000 mg | ORAL_CAPSULE | Freq: Every day | ORAL | Status: DC
  Administered 2015-12-21: 30 mg via ORAL
  Filled 2015-12-21: qty 1

## 2015-12-21 MED ORDER — CARVEDILOL 12.5 MG PO TABS
12.5000 mg | ORAL_TABLET | Freq: Two times a day (BID) | ORAL | Status: DC
  Administered 2015-12-21: 12.5 mg via ORAL
  Filled 2015-12-21: qty 1

## 2015-12-21 MED ORDER — NITROGLYCERIN 0.4 MG SL SUBL: 0.4000 mg | SUBLINGUAL_TABLET | SUBLINGUAL | Status: DC | PRN

## 2015-12-21 MED ORDER — ACETAMINOPHEN 325 MG PO TABS
650.0000 mg | ORAL_TABLET | ORAL | Status: DC | PRN
  Administered 2015-12-21: 650 mg via ORAL
  Filled 2015-12-21: qty 2

## 2015-12-21 MED ORDER — LEVOTHYROXINE SODIUM 50 MCG PO TABS
50.0000 ug | ORAL_TABLET | Freq: Every day | ORAL | Status: DC
  Administered 2015-12-21: 50 ug via ORAL
  Filled 2015-12-21: qty 1

## 2015-12-21 MED ORDER — ASPIRIN EC 81 MG PO TBEC
81.0000 mg | DELAYED_RELEASE_TABLET | Freq: Every day | ORAL | Status: DC
  Administered 2015-12-21: 81 mg via ORAL
  Filled 2015-12-21: qty 1

## 2015-12-21 MED ORDER — GEMFIBROZIL 600 MG PO TABS
600.0000 mg | ORAL_TABLET | Freq: Two times a day (BID) | ORAL | Status: DC
  Administered 2015-12-21: 600 mg via ORAL
  Filled 2015-12-21 (×2): qty 1

## 2015-12-21 MED ORDER — TRAZODONE HCL 100 MG PO TABS
100.0000 mg | ORAL_TABLET | Freq: Every day | ORAL | Status: DC
  Administered 2015-12-21: 100 mg via ORAL
  Filled 2015-12-21: qty 1

## 2015-12-21 MED ORDER — LINACLOTIDE 290 MCG PO CAPS
290.0000 ug | ORAL_CAPSULE | Freq: Every day | ORAL | Status: DC | PRN
  Filled 2015-12-21: qty 1

## 2015-12-21 MED ORDER — ASPIRIN 81 MG PO CHEW
324.0000 mg | CHEWABLE_TABLET | Freq: Once | ORAL | Status: AC
  Administered 2015-12-21: 324 mg via ORAL
  Filled 2015-12-21: qty 4

## 2015-12-21 MED ORDER — OMEPRAZOLE 40 MG PO CPDR: 40.0000 mg | DELAYED_RELEASE_CAPSULE | Freq: Every day | ORAL | Status: DC

## 2015-12-21 MED ORDER — ALPRAZOLAM 0.25 MG PO TABS
0.5000 mg | ORAL_TABLET | Freq: Four times a day (QID) | ORAL | Status: DC
  Administered 2015-12-21 (×3): 0.5 mg via ORAL
  Filled 2015-12-21 (×4): qty 2

## 2015-12-21 MED ORDER — ALPRAZOLAM 0.25 MG PO TABS: 0.5000 mg | ORAL_TABLET | Freq: Four times a day (QID) | ORAL | Status: DC

## 2015-12-21 MED ORDER — GI COCKTAIL ~~LOC~~: 30.0000 mL | Freq: Four times a day (QID) | ORAL | Status: DC | PRN

## 2015-12-21 NOTE — ED Notes (Signed)
Pt discharged by admitting doctor after echo and normal cardiac enzymes

## 2015-12-21 NOTE — H&P (Addendum)
History and Physical  Patient Name: Roy [name]     ZOX:[id number]    DOB: [birth year]    DOA: 12/20/2015 Referring physician: Rochele Raring, MD PCP: Mechele Claude, MD      Chief Complaint: Chest pain  HPI: Roy [name] is a 45 y.o. male with a past medical history significant for smoking, obesity, HTN, anxiety, and hypothyroidism who presents with chest pain.  The pain started 3 days ago while the patient was standing outside talking to his neighbor, not doing anything in particular.  Since then, the pain has persisted, essentially constantly for three days, mild to moderate in intensity, like a "pressure" or "bloating" in the epigastric or substernal area, radiating to the left chest.  It is not worse with walking to the car, not worse with position, with eating, or with moving the arm.  He initially thought it was anxiety, so he tried a Xanax which didn't help.  Then he thought it was an ulcer (which he has had) and so he took a Protonix and Pepcid, but that didn't help either, so he came to the ER.  There has been no leg swelling, recent surgery or immobilization.  No nausea, vomiting, alcohol use.  He had some shortness of breath that he described as "I try to sigh, but I can't get the air out".  In the ED, the patient's initial ECG showed NSR and troponin was negative.  A repeat Istat troponin was 0.04, and so [facility] was asked to admit for observation, serial troponins and risk stratification.  Saw Dr. Elease Hashimoto for chest pressure 1 year ago, had a normal stress test.   Review of Systems:  All other systems negative except as just noted or noted in the history of present illness.  Allergies  Allergen Reactions  . Penicillins Rash    Prior to Admission medications   Medication Sig Start Date End Date Taking? Authorizing Provider  ALPRAZolam Prudy Feeler) 0.5 MG tablet TAKE ONE TABLET BY MOUTH 4 TIMES DAILY 10/25/15  Yes Mechele Claude, MD  aspirin EC 81 MG tablet Take 81 mg by mouth daily.    Yes Historical Provider, MD  Aspirin-Salicylamide-Caffeine (BC HEADACHE POWDER PO) Take 1 Package by mouth daily as needed (for pain).   Yes Historical Provider, MD  carvedilol (COREG) 12.5 MG tablet Take 1 tablet (12.5 mg total) by mouth 2 (two) times daily with a meal. 06/19/15  Yes Mechele Claude, MD  DULoxetine (CYMBALTA) 30 MG capsule TAKE TWO CAPSULES BY MOUTH ONCE DAILY WITH  SUPPER 12/11/15  Yes Mechele Claude, MD  Flaxseed, Linseed, (FLAXSEED OIL) 1200 MG CAPS Take 2 capsules by mouth every morning.    Yes Historical Provider, MD  gabapentin (NEURONTIN) 300 MG capsule TAKE ONE CAPSULE BY MOUTH THREE TIMES DAILY Patient taking differently: TAKE ONE CAPSULE BY MOUTH TWICE DAILY 11/10/15  Yes Mechele Claude, MD  Garlic 1000 MG CAPS Take 2 capsules by mouth daily.    Yes Historical Provider, MD  gemfibrozil (LOPID) 600 MG tablet TAKE ONE TABLET BY MOUTH TWICE DAILY BEFORE A MEAL 10/23/15  Yes Mechele Claude, MD  levothyroxine (SYNTHROID, LEVOTHROID) 50 MCG tablet TAKE ONE TABLET BY MOUTH ONCE DAILY 02/09/15  Yes Frederica Kuster, MD  Linaclotide [facility]) 290 MCG CAPS capsule Take 1 capsule (290 mcg total) by mouth daily. Patient taking differently: Take 290 mcg by mouth daily as needed (constipation).  04/14/15  Yes Mechele Claude, MD  Magnesium 250 MG TABS Take 1 tablet by mouth daily.  Yes Historical Provider, MD  meloxicam (MOBIC) 15 MG tablet TAKE ONE TABLET BY MOUTH ONCE DAILY 12/11/15  Yes Mechele ClaudeWarren Stacks, MD  Omega-3 Fatty Acids (FISH OIL) 1000 MG CAPS Take 2 capsules by mouth daily.   Yes Historical Provider, MD  pantoprazole (PROTONIX) 40 MG tablet TAKE ONE TABLET BY MOUTH ONCE DAILY 10/23/15  Yes Mechele ClaudeWarren Stacks, MD  Testosterone (ANDROGEL PUMP) 20.25 MG/ACT (1.62%) GEL Apply 4 pumps daily to upper chest and shoulders 01/12/15  Yes Mechele ClaudeWarren Stacks, MD  traZODone (DESYREL) 100 MG tablet TAKE ONE TABLET BY MOUTH ONCE DAILY AT BEDTIME 10/10/15  Yes Elenora GammaSamuel L Bradshaw, MD    Past Medical History    Diagnosis Date  . Ulnar neuropathy at elbow   . Radiculopathy of cervical region   . Median nerve dysfunction   . Weakness   . Anxiety   . Depression     Past Surgical History  Procedure Laterality Date  . Rotator cuff repair Right   . Biceps tendon repair    . Neck surgery      Fusion done two seperate times    Family history: family history includes Arrhythmia in his father; Hypertension in his mother.  No family history of MI.  Social History: Patient lives with his wife. He is on disability.  He is an active smoker.        Physical Exam: BP 126/93 mmHg  Pulse 58  Temp(Src) 98 F (36.7 C) (Oral)  Resp 18  SpO2 98% General appearance: Obese adult male, alert and in acute distress.  Occasionally twinges with pain.   Eyes: Anicteric, conjunctiva pink, lids and lashes normal.     ENT: No nasal deformity, discharge, or epistaxis.  OP moist without lesions.   Skin: Warm and dry.  Scar on left eyebrow. Cardiac: RRR, nl S1-S2, no murmurs appreciated.  Capillary refill is brisk.  JVP not visible.  No LE edema or swelling.  Radial pulses 2+ and symmetric.  No carotid bruits.   Respiratory: Normal respiratory rate and rhythm.  CTAB without rales or wheezes. Abdomen: Abdomen soft without rigidity.  No TTP. No ascites, distension.   MSK: No deformities or effusions.   Pain with palpation of sternum and epigastrium.  No pain with arm movement. Neuro: Sensorium intact and responding to questions, attention normal.  Speech is fluent.  Moves all extremities equally and with normal coordination.    Psych: Behavior appropriate.  Affect normal.  No evidence of aural or visual hallucinations or delusions.       Labs on Admission:  The metabolic panel shows normal electrolytes and renal function. The complete blood count shows no leukocytosis or anemia. The initial troponin is negative, repeat slightly elevated.  Radiological Exams on Admission: Personally reviewed: Dg Chest 2  View  12/20/2015  CLINICAL DATA:  Central and LEFT side chest pain for 3 days, occasional shortness of breath, history smoking, hypertension EXAM: CHEST  2 VIEW COMPARISON:  10/19/2015 FINDINGS: Upper normal heart size. Normal mediastinal contours and pulmonary vascularity. Ovoid nodular density projecting at LEFT upper lobe on PA view, on lateral view corresponding to an EKG lead on the anterior chest wall. Central peribronchial thickening with minimal RIGHT basilar atelectasis. No infiltrate, pleural effusion or pneumothorax. IMPRESSION: No definite acute abnormalities. Electronically Signed   By: Ulyses SouthwardMark  Boles M.D.   On: 12/20/2015 18:55    EKG: Independently reviewed. NSR, rate 60, QTc 428, no ischemic changes.    Assessment/Plan 1. Chest pain: This is new.  The patient has HEART score of 2. Angina is is doubted.  GERD/ulcer doubted, given failure of antacids.  Anxiety doubted given failure of Xanax.  PE ruled out with PERC rule.  We have been asked to admit the patient for observation and etiology consultation.  -Serial troponins are ordered -Telemetry -Lipase ordered -Will order serial enzymes and then echocardiogram.  If echocardiogram normal without wall motion abnormalities after 3 days of chest pain, will discharge with Cardiologist follow up. -If any of the serum enzymes are >0.03 ng/mL, will consult Cardiology   2. Anxiety:  -Continue home duloxetine, alprazolam, gabapentin, and trazodone  3. HTN:  Normotensive. -Continue home carvedilol and aspirin and gemfibrozil  4. GERD:  -Continue home PPI  5. Hypothyroidism:  Stable.  -Continue home levothyroxine    DVT PPx: Outpatient status Diet: Heart healthy Consultants: None Code Status: Full Family Communication: Wife, present at bedside Patient seen 1:26 AM on 12/21/2015.  Disposition Plan:  Observe for arrhythmia on telemetry, serial troponins and subsequent Echocardiogram.  If testing negative, home with outpatient  follow up.  If serum troponins increase above undetectable level, will consult Cardiology.      Alberteen Sam [facility] Pager [phone]

## 2015-12-21 NOTE — Progress Notes (Signed)
  Echocardiogram 2D Echocardiogram has been performed.  Leta JunglingCooper, Zyanya Glaza M 12/21/2015, 10:47 AM

## 2015-12-21 NOTE — ED Notes (Signed)
Pt anxious. States that he is worried and short of breath.

## 2015-12-21 NOTE — ED Notes (Signed)
Admitting md paged to ask about echo results, per md the results are not back yet

## 2015-12-21 NOTE — ED Notes (Signed)
Cardio ultrasound contacted to talk about the status on echo

## 2015-12-21 NOTE — Discharge Summary (Signed)
Physician Discharge Summary  Roy GarfinkelStephen M Ciolino RUE:[id number] DOB: 04/02/1971 DOA: 12/20/2015  PCP: Mechele ClaudeSTACKS,WARREN, MD  Admit date: 12/20/2015 Discharge date: 12/21/2015  Time spent: 40 minutes   Discharge Condition: stable    Discharge Diagnoses:  Principal Problem:   Chest pain Active Problems:   Morbid obesity (HCC)   Nicotine abuse   Benign essential HTN   Hypothyroid   History of present illness:  Roy GarfinkelStephen M Roy [name] is a 45 y.o. male with a past medical history significant for smoking, obesity, HTN, anxiety, and hypothyroidism who presents with chest pain.The pain started 3 days ago while the patient was standing outside talking to his neighbor, not doing anything in particular. Since then, the pain has persisted, essentially constantly for three days, mild to moderate in intensity, like a "pressure" or "bloating" in the epigastric or substernal area, radiating to the left chest. It is not worse with walking to the car, not worse with position, with eating, or with moving the arm. He initially thought it was anxiety, so he tried a Xanax which didn't help. Then he thought it was an ulcer (which he has had) and so he took a Protonix and Pepcid, but that didn't help either, so he came to the ER.  Hospital Course:  Chest pain -- has been constant for 3 days - likely not cardiac- troponin x 3 sets negative- ECHO does not reveal WMA - as it may be GI related- have asked him to avoid BS powers and other NSAIDs - have stopped his Mobic - given prescription for PPI  Discharge Exam: There were no vitals filed for this visit. Filed Vitals:   12/21/15 1115 12/21/15 1300  BP: 108/62 113/73  Pulse: 53 52  Temp:    Resp: 15 14    General: AAO x 3, no distress Cardiovascular: RRR, no murmurs  Respiratory: clear to auscultation bilaterally GI: soft, non-tender, non-distended, bowel sound positive  Discharge Instructions You were cared for by a hospitalist during your hospital stay. If  you have any questions about your discharge medications or the care you received while you were in the hospital after you are discharged, you can call the unit and asked to speak with the hospitalist on call if the hospitalist that took care of you is not available. Once you are discharged, your primary care physician will handle any further medical issues. Please note that NO REFILLS for any discharge medications will be authorized once you are discharged, as it is imperative that you return to your primary care physician (or establish a relationship with a primary care physician if you do not have one) for your aftercare needs so that they can reassess your need for medications and monitor your lab values.      Discharge Instructions    Diet - low sodium heart healthy    Complete by:  As directed      Increase activity slowly    Complete by:  As directed             Medication List    STOP taking these medications        BC HEADACHE POWDER PO     meloxicam 15 MG tablet  Commonly known as:  MOBIC      TAKE these medications        ALPRAZolam 0.5 MG tablet  Commonly known as:  XANAX  TAKE ONE TABLET BY MOUTH 4 TIMES DAILY     aspirin EC 81 MG tablet  Take 81 mg  by mouth daily.     carvedilol 12.5 MG tablet  Commonly known as:  COREG  Take 1 tablet (12.5 mg total) by mouth 2 (two) times daily with a meal.     DULoxetine 30 MG capsule  Commonly known as:  CYMBALTA  TAKE TWO CAPSULES BY MOUTH ONCE DAILY WITH  SUPPER     Fish Oil 1000 MG Caps  Take 2 capsules by mouth daily.     Flaxseed Oil 1200 MG Caps  Take 2 capsules by mouth every morning.     gabapentin 300 MG capsule  Commonly known as:  NEURONTIN  TAKE ONE CAPSULE BY MOUTH THREE TIMES DAILY     Garlic 1000 MG Caps  Take 2 capsules by mouth daily.     gemfibrozil 600 MG tablet  Commonly known as:  LOPID  TAKE ONE TABLET BY MOUTH TWICE DAILY BEFORE A MEAL     levothyroxine 50 MCG tablet  Commonly known as:   SYNTHROID, LEVOTHROID  TAKE ONE TABLET BY MOUTH ONCE DAILY     Linaclotide 290 MCG Caps capsule  Commonly known as:  LINZESS  Take 1 capsule (290 mcg total) by mouth daily.     Magnesium 250 MG Tabs  Take 1 tablet by mouth daily.     omeprazole 40 MG capsule  Commonly known as:  PRILOSEC  Take 1 capsule (40 mg total) by mouth daily.     pantoprazole 40 MG tablet  Commonly known as:  PROTONIX  TAKE ONE TABLET BY MOUTH ONCE DAILY     Testosterone 20.25 MG/ACT (1.62%) Gel  Commonly known as:  ANDROGEL PUMP  Apply 4 pumps daily to upper chest and shoulders     traZODone 100 MG tablet  Commonly known as:  DESYREL  TAKE ONE TABLET BY MOUTH ONCE DAILY AT BEDTIME       Allergies  Allergen Reactions  . Penicillins Rash   Follow-up Information    Follow up with Mechele Claude, MD.   Specialty:  Family Medicine   Contact information:   [address] [place] [address] [phone]       Follow up with [facility].   Specialty:  Cardiology   Contact information:   [address], [address] [place] [address] [phone]       The results of significant diagnostics from this hospitalization (including imaging, microbiology, ancillary and laboratory) are listed below for reference.    Significant Diagnostic Studies: Dg Chest 2 View  12/20/2015  CLINICAL DATA:  Central and LEFT side chest pain for 3 days, occasional shortness of breath, history smoking, hypertension EXAM: CHEST  2 VIEW COMPARISON:  10/19/2015 FINDINGS: Upper normal heart size. Normal mediastinal contours and pulmonary vascularity. Ovoid nodular density projecting at LEFT upper lobe on PA view, on lateral view corresponding to an EKG lead on the anterior chest wall. Central peribronchial thickening with minimal RIGHT basilar atelectasis. No infiltrate, pleural effusion or pneumothorax. IMPRESSION: No definite acute abnormalities. Electronically Signed   By: Ulyses Southward  M.D.   On: 12/20/2015 18:55    Microbiology: No results found for this or any previous visit (from the past 240 hour(s)).   Labs: Basic Metabolic Panel:  Recent Labs Lab 12/20/15 1815  NA 139  K 3.8  CL 103  CO2 25  GLUCOSE 115*  BUN 12  CREATININE 0.87  CALCIUM 9.2   Liver Function Tests: No results for input(s): AST, ALT, ALKPHOS, BILITOT, PROT, ALBUMIN in  the last 168 hours.  Recent Labs Lab 12/21/15 0158  LIPASE 46   No results for input(s): AMMONIA in the last 168 hours. CBC:  Recent Labs Lab 12/20/15 1815  WBC 5.3  HGB 13.7  HCT 41.4  MCV 93.9  PLT 219   Cardiac Enzymes:  Recent Labs Lab 12/20/15 1815 12/21/15 0158 12/21/15 0444 12/21/15 0737  TROPONINI <0.03 <0.03 <0.03 <0.03   BNP: BNP (last 3 results) No results for input(s): BNP in the last 8760 hours.  ProBNP (last 3 results) No results for input(s): PROBNP in the last 8760 hours.  CBG: No results for input(s): GLUCAP in the last 168 hours.     SignedCalvert Cantor, MD [facility] 12/21/2015, 2:42 PM

## 2015-12-22 ENCOUNTER — Encounter: Payer: Self-pay | Admitting: Family Medicine

## 2015-12-22 ENCOUNTER — Ambulatory Visit (INDEPENDENT_AMBULATORY_CARE_PROVIDER_SITE_OTHER): Payer: Medicare Other | Admitting: Family Medicine

## 2015-12-22 VITALS — BP 152/92 | HR 64 | Temp 97.2°F | Ht 68.0 in | Wt 349.0 lb

## 2015-12-22 DIAGNOSIS — I10 Essential (primary) hypertension: Secondary | ICD-10-CM

## 2015-12-22 DIAGNOSIS — R079 Chest pain, unspecified: Secondary | ICD-10-CM | POA: Diagnosis not present

## 2015-12-22 MED ORDER — DULOXETINE HCL 60 MG PO CPEP: 120.0000 mg | ORAL_CAPSULE | Freq: Every day | ORAL | Status: DC

## 2015-12-22 MED ORDER — LEVOTHYROXINE SODIUM 50 MCG PO TABS: 50.0000 ug | ORAL_TABLET | Freq: Every day | ORAL | Status: DC

## 2015-12-22 MED ORDER — PANTOPRAZOLE SODIUM 40 MG PO TBEC: 40.0000 mg | DELAYED_RELEASE_TABLET | Freq: Two times a day (BID) | ORAL | Status: DC

## 2015-12-22 MED ORDER — CARVEDILOL 12.5 MG PO TABS: 12.5000 mg | ORAL_TABLET | Freq: Two times a day (BID) | ORAL | Status: DC

## 2015-12-22 MED ORDER — ALPRAZOLAM 0.5 MG PO TABS: 0.5000 mg | ORAL_TABLET | Freq: Three times a day (TID) | ORAL | Status: DC | PRN

## 2015-12-22 MED ORDER — LINACLOTIDE 290 MCG PO CAPS: 290.0000 ug | ORAL_CAPSULE | Freq: Every day | ORAL | Status: DC

## 2015-12-22 NOTE — Progress Notes (Signed)
Subjective:  Patient ID: Roy [name], male    DOB: 10-07-1970  Age: 45 y.o. MRN: [id number]  CC: Hospitalization Follow-up; GAD; and Gastroesophageal Reflux   HPI Roy [name] presents for Using 50 goodies every 2 weeks due to daily HA.  Needs the caffeine. Advil not helping. Went to ED For chest pain. That report reviewed & attached. Showed chest pain to be GI. Had panic attack during [place] study in E.D.  Needs tx. Occurring frequently- 2-3 or more per week.  HA is throbbing frontal to parietal bilateral. 5-7/10. Moderate reief with Goodies, but recurs after a few hours.  History Roy [name] has a past medical history of Ulnar neuropathy at elbow; Radiculopathy of cervical region; Median nerve dysfunction; Weakness; Anxiety; and Depression.   He has past surgical history that includes Rotator cuff repair (Right); Biceps tendon repair; and Neck surgery.   His family history includes Arrhythmia in his father; Hypertension in his mother.He reports that he has been smoking Cigarettes.  He has been smoking about 0.50 packs per day. He has quit using smokeless tobacco. His smokeless tobacco use included Snuff and Chew. He reports that he does not drink alcohol or use illicit drugs.    ROS Review of Systems  Constitutional: Negative for fever, chills, diaphoresis and unexpected weight change.  HENT: Negative for congestion, hearing loss, rhinorrhea and sore throat.   Eyes: Negative for visual disturbance.  Respiratory: Positive for shortness of breath (related to inability to take a deep breath because of pannus). Negative for cough.   Cardiovascular: Negative for chest pain.  Gastrointestinal: Negative for abdominal pain, diarrhea and constipation.  Genitourinary: Negative for dysuria and flank pain.  Musculoskeletal: Negative for joint swelling and arthralgias.  Skin: Negative for rash.  Neurological: Positive for headaches. Negative for dizziness.  Psychiatric/Behavioral: Negative for  sleep disturbance and dysphoric mood. The patient is nervous/anxious.     Objective:  BP 152/92 mmHg  Pulse 64  Temp(Src) 97.2 F (36.2 C) (Oral)  Ht  (1.727 m)  Wt 349 lb (158.305 kg)  BMI 53.08 kg/m2  SpO2 97%  BP Readings from Last 3 Encounters:  12/22/15 152/92  12/21/15 128/56  11/08/15 121/71    Wt Readings from Last 3 Encounters:  12/22/15 349 lb (158.305 kg)  11/08/15 349 lb (158.305 kg)  10/19/15 342 lb 6.4 oz (155.312 kg)     Physical Exam  Constitutional: He is oriented to person, place, and time. He appears well-developed and well-nourished. No distress.  HENT:  Head: Normocephalic and atraumatic.  Right Ear: External ear normal.  Left Ear: External ear normal.  Nose: Nose normal.  Mouth/Throat: Oropharynx is clear and moist.  Eyes: Conjunctivae and EOM are normal. Pupils are equal, round, and reactive to light.  Neck: Normal range of motion. Neck supple. No thyromegaly present.  Cardiovascular: Normal rate, regular rhythm and normal heart sounds.   No murmur heard. Pulmonary/Chest: Effort normal and breath sounds normal. No respiratory distress. He has no wheezes. He has no rales.  Abdominal: Soft. Bowel sounds are normal. He exhibits no distension. There is no tenderness.  Lymphadenopathy:    He has no cervical adenopathy.  Neurological: He is alert and oriented to person, place, and time. He has normal reflexes.  Skin: Skin is warm and dry.  Psychiatric: He has a normal mood and affect. His behavior is normal. Judgment and thought content normal.     Lab Results  Component Value Date   WBC 5.3 12/20/2015  HGB 13.7 12/20/2015   HCT 41.4 12/20/2015   PLT 219 12/20/2015   GLUCOSE 115* 12/20/2015   CHOL 159 10/20/2015   TRIG 98 10/20/2015   HDL 36* 10/20/2015   LDLCALC 103* 10/20/2015   ALT 16 10/20/2015   AST 17 10/20/2015   NA 139 12/20/2015   K 3.8 12/20/2015   CL 103 12/20/2015   CREATININE 0.87 12/20/2015   BUN 12 12/20/2015    CO2 25 12/20/2015   TSH 1.840 10/20/2015   PSA 0.3 11/17/2014   HGBA1C 5.8% 11/17/2014    Dg Chest 2 View  12/20/2015  CLINICAL DATA:  Central and LEFT side chest pain for 3 days, occasional shortness of breath, history smoking, hypertension EXAM: CHEST  2 VIEW COMPARISON:  10/19/2015 FINDINGS: Upper normal heart size. Normal mediastinal contours and pulmonary vascularity. Ovoid nodular density projecting at LEFT upper lobe on PA view, on lateral view corresponding to an EKG lead on the anterior chest wall. Central peribronchial thickening with minimal RIGHT basilar atelectasis. No infiltrate, pleural effusion or pneumothorax. IMPRESSION: No definite acute abnormalities. Electronically Signed   By: Ulyses SouthwardMark  Boles M.D.   On: 12/20/2015 18:55    Assessment & Plan:   There are no diagnoses linked to this encounter.  Pt. To increase duloxetine for anxiety, but also for HA. Continue alprazolam prn panic.  I have discontinued Roy [name]'s Flaxseed Oil, Garlic, aspirin EC, Magnesium, DULoxetine, and omeprazole. I have also changed his ALPRAZolam, levothyroxine, and pantoprazole. Additionally, I am having him start on DULoxetine. Lastly, I am having him maintain his Fish Oil, Testosterone, traZODone, gemfibrozil, gabapentin, carvedilol, and Linaclotide.  Meds ordered this encounter  Medications  . ALPRAZolam (XANAX) 0.5 MG tablet    Sig: Take 1 tablet (0.5 mg total) by mouth 3 (three) times daily as needed for anxiety (& panic).    Dispense:  90 tablet    Refill:  5  . carvedilol (COREG) 12.5 MG tablet    Sig: Take 1 tablet (12.5 mg total) by mouth 2 (two) times daily with a meal.    Dispense:  60 tablet    Refill:  5  . levothyroxine (SYNTHROID, LEVOTHROID) 50 MCG tablet    Sig: Take 1 tablet (50 mcg total) by mouth daily.    Dispense:  30 tablet    Refill:  10  . Linaclotide (LINZESS) 290 MCG CAPS capsule    Sig: Take 1 capsule (290 mcg total) by mouth daily.    Dispense:  30 capsule     Refill:  2  . pantoprazole (PROTONIX) 40 MG tablet    Sig: Take 1 tablet (40 mg total) by mouth 2 (two) times daily.    Dispense:  60 tablet    Refill:  5    Replaces omeprazole - DC Omeprazole  . DULoxetine (CYMBALTA) 60 MG capsule    Sig: Take 2 capsules (120 mg total) by mouth daily.    Dispense:  60 capsule    Refill:  5     Follow-up: Return in about 1 month (around 01/21/2016).  Mechele ClaudeWarren Spruha Weight, M.D.

## 2016-01-22 ENCOUNTER — Encounter (INDEPENDENT_AMBULATORY_CARE_PROVIDER_SITE_OTHER): Payer: Self-pay

## 2016-01-22 ENCOUNTER — Ambulatory Visit (INDEPENDENT_AMBULATORY_CARE_PROVIDER_SITE_OTHER): Payer: Medicare Other

## 2016-01-22 ENCOUNTER — Encounter: Payer: Self-pay | Admitting: Family Medicine

## 2016-01-22 ENCOUNTER — Ambulatory Visit (INDEPENDENT_AMBULATORY_CARE_PROVIDER_SITE_OTHER): Payer: Medicare Other | Admitting: Family Medicine

## 2016-01-22 ENCOUNTER — Encounter: Payer: Self-pay | Admitting: *Deleted

## 2016-01-22 VITALS — BP 128/89 | HR 62 | Temp 97.0°F | Ht 68.0 in | Wt 332.0 lb

## 2016-01-22 DIAGNOSIS — M546 Pain in thoracic spine: Secondary | ICD-10-CM

## 2016-01-22 DIAGNOSIS — E038 Other specified hypothyroidism: Secondary | ICD-10-CM

## 2016-01-22 DIAGNOSIS — M5412 Radiculopathy, cervical region: Secondary | ICD-10-CM | POA: Diagnosis not present

## 2016-01-22 DIAGNOSIS — E291 Testicular hypofunction: Secondary | ICD-10-CM | POA: Diagnosis not present

## 2016-01-22 DIAGNOSIS — E349 Endocrine disorder, unspecified: Secondary | ICD-10-CM

## 2016-01-22 DIAGNOSIS — M549 Dorsalgia, unspecified: Secondary | ICD-10-CM

## 2016-01-22 DIAGNOSIS — R51 Headache: Secondary | ICD-10-CM

## 2016-01-22 DIAGNOSIS — R519 Headache, unspecified: Secondary | ICD-10-CM | POA: Insufficient documentation

## 2016-01-22 NOTE — Progress Notes (Signed)
Quick Note:  Your chest x-ray looked normal. Thanks, WS. ______ 

## 2016-01-22 NOTE — Progress Notes (Signed)
Subjective:  Patient ID: Roy [name], male    DOB: 1971-05-30  Age: 45 y.o. MRN: [id number]  CC: Hypertension and GAD   HPI [name] presents for Using 50 goodies every 2 weeks due to daily HA.  Needs the caffeine. Advil not helping. Went to ED For chest pain. That report reviewed & attached. Showed chest pain to be GI. Had panic attack during [place] study in E.D.  Needs tx. Occurring frequently- 2-3 or more per week.  HA is throbbing frontal to parietal bilateral. 5-7/10. Moderate reief with Goodies, but recurs after a few hours. Left side of neck stiff and exacerbated HA. Radiating to shoulder Dull ache at left mid back at shoulder blade to the waist. 4-5/10 Intermittent with moving a certain way. Denies dyspnea.  History [name] has a past medical history of Ulnar neuropathy at elbow; Radiculopathy of cervical region; Median nerve dysfunction; Weakness; Anxiety; and Depression.   He has past surgical history that includes Rotator cuff repair (Right); Biceps tendon repair; and Neck surgery.   His family history includes Arrhythmia in his father; Hypertension in his mother.He reports that he has been smoking Cigarettes.  He has been smoking about 0.50 packs per day. He has quit using smokeless tobacco. His smokeless tobacco use included Snuff and Chew. He reports that he does not drink alcohol or use illicit drugs.    ROS Review of Systems  Constitutional: Positive for fatigue. Negative for fever, chills, diaphoresis and unexpected weight change.  HENT: Negative for congestion, hearing loss, rhinorrhea and sore throat.   Eyes: Negative for visual disturbance.  Respiratory: Negative for cough and shortness of breath (related to inability to take a deep breath because of pannus).   Cardiovascular: Negative for chest pain.  Gastrointestinal: Negative for abdominal pain, diarrhea and constipation.  Genitourinary: Negative for dysuria and flank pain.  Musculoskeletal: Positive for  neck pain (radiating to left upper trapezius border. Constant). Negative for joint swelling and arthralgias.  Skin: Negative for rash.  Neurological: Positive for headaches. Negative for dizziness, tremors and weakness.  Psychiatric/Behavioral: Negative for sleep disturbance and dysphoric mood. The patient is nervous/anxious.     Objective:  BP 128/89 mmHg  Pulse 62  Temp(Src) 97 F (36.1 C) (Oral)  Ht '5\' 8"'  (1.727 m)  Wt 332 lb (150.594 kg)  BMI 50.49 kg/m2  SpO2 97%  BP Readings from Last 3 Encounters:  01/22/16 128/89  12/22/15 152/92  12/21/15 128/56    Wt Readings from Last 3 Encounters:  01/22/16 332 lb (150.594 kg)  12/22/15 349 lb (158.305 kg)  11/08/15 349 lb (158.305 kg)     Physical Exam  Constitutional: He is oriented to person, place, and time. He appears well-developed and well-nourished. No distress.  HENT:  Head: Normocephalic and atraumatic.  Right Ear: External ear normal.  Left Ear: External ear normal.  Nose: Nose normal.  Mouth/Throat: Oropharynx is clear and moist.  Eyes: Conjunctivae and EOM are normal. Pupils are equal, round, and reactive to light.  Neck: Normal range of motion. Neck supple. No thyromegaly present.  Cardiovascular: Normal rate, regular rhythm and normal heart sounds.   No murmur heard. Pulmonary/Chest: Effort normal and breath sounds normal. No respiratory distress. He has no wheezes. He has no rales.  Abdominal: Soft. Bowel sounds are normal. He exhibits no distension. There is no tenderness.  Lymphadenopathy:    He has no cervical adenopathy.  Neurological: He is alert and oriented to person, place, and time. He has  normal reflexes.  Skin: Skin is warm and dry.  Psychiatric: He has a normal mood and affect. His behavior is normal. Judgment and thought content normal.     Lab Results  Component Value Date   WBC 5.3 12/20/2015   HGB 13.7 12/20/2015   HCT 41.4 12/20/2015   PLT 219 12/20/2015   GLUCOSE 115* 12/20/2015    CHOL 159 10/20/2015   TRIG 98 10/20/2015   HDL 36* 10/20/2015   LDLCALC 103* 10/20/2015   ALT 16 10/20/2015   AST 17 10/20/2015   NA 139 12/20/2015   K 3.8 12/20/2015   CL 103 12/20/2015   CREATININE 0.87 12/20/2015   BUN 12 12/20/2015   CO2 25 12/20/2015   TSH 1.840 10/20/2015   PSA 0.3 11/17/2014   HGBA1C 5.8% 11/17/2014    Dg Chest 2 View  12/20/2015  CLINICAL DATA:  Central and LEFT side chest pain for 3 days, occasional shortness of breath, history smoking, hypertension EXAM: CHEST  2 VIEW COMPARISON:  10/19/2015 FINDINGS: Upper normal heart size. Normal mediastinal contours and pulmonary vascularity. Ovoid nodular density projecting at LEFT upper lobe on PA view, on lateral view corresponding to an EKG lead on the anterior chest wall. Central peribronchial thickening with minimal RIGHT basilar atelectasis. No infiltrate, pleural effusion or pneumothorax. IMPRESSION: No definite acute abnormalities. Electronically Signed   By: Lavonia Dana M.D.   On: 12/20/2015 18:55    Assessment & Plan:   Barclay was seen today for hypertension and gad.  Diagnoses and all orders for this visit:  Cervical nerve root disorder -     Ambulatory referral to Neurology -     CBC with Differential/Platelet -     CMP14+EGFR  Chronic intractable headache, unspecified headache type -     Ambulatory referral to Neurology -     CBC with Differential/Platelet -     CMP14+EGFR  Acute thoracic back pain -     DG Chest 2 View; Future -     CBC with Differential/Platelet -     CMP14+EGFR  Hypotestosteronism -     Testosterone,Free and Total -     CBC with Differential/Platelet -     CMP14+EGFR  Other specified hypothyroidism -     TSH + free T4 -     CBC with Differential/Platelet -     CMP14+EGFR  Morbid obesity due to excess calories (HCC) -     CBC with Differential/Platelet -     CMP14+EGFR      I am having Mr. Hanssen maintain his Fish Oil, Testosterone, traZODone, gemfibrozil,  gabapentin, ALPRAZolam, carvedilol, levothyroxine, linaclotide, pantoprazole, DULoxetine, and omeprazole.  Meds ordered this encounter  Medications  . omeprazole (PRILOSEC) 40 MG capsule    Sig: Take 40 mg by mouth daily.      Follow-up: Return in about 1 month (around 02/22/2016).  Claretta Fraise, M.D.

## 2016-01-23 LAB — CMP14+EGFR
A/G RATIO: 2.2 (ref 1.2–2.2)
ALT: 14 IU/L (ref 0–44)
AST: 13 IU/L (ref 0–40)
Albumin: 4.7 g/dL (ref 3.5–5.5)
Alkaline Phosphatase: 66 IU/L (ref 39–117)
BUN/Creatinine Ratio: 17 (ref 9–20)
BUN: 15 mg/dL (ref 6–24)
Bilirubin Total: 0.2 mg/dL (ref 0.0–1.2)
CALCIUM: 9.4 mg/dL (ref 8.7–10.2)
CO2: 23 mmol/L (ref 18–29)
CREATININE: 0.9 mg/dL (ref 0.76–1.27)
Chloride: 97 mmol/L (ref 96–106)
GFR, EST AFRICAN AMERICAN: 121 mL/min/{1.73_m2} (ref >59)
GFR, EST NON AFRICAN AMERICAN: 105 mL/min/{1.73_m2} (ref >59)
GLOBULIN, TOTAL: 2.1 g/dL (ref 1.5–4.5)
Glucose: 78 mg/dL (ref 65–99)
Potassium: 4.1 mmol/L (ref 3.5–5.2)
SODIUM: 139 mmol/L (ref 134–144)
Total Protein: 6.8 g/dL (ref 6.0–8.5)

## 2016-01-23 LAB — TESTOSTERONE,FREE AND TOTAL
TESTOSTERONE: 454 ng/dL (ref 348–1197)
Testosterone, Free: 10.4 pg/mL (ref 6.8–21.5)

## 2016-01-23 LAB — CBC WITH DIFFERENTIAL/PLATELET
BASOS: 1 %
Basophils Absolute: 0 10*3/uL (ref 0.0–0.2)
EOS (ABSOLUTE): 0.1 10*3/uL (ref 0.0–0.4)
EOS: 2 %
HEMATOCRIT: 42.2 % (ref 37.5–51.0)
HEMOGLOBIN: 14.2 g/dL (ref 12.6–17.7)
IMMATURE GRANS (ABS): 0 10*3/uL (ref 0.0–0.1)
IMMATURE GRANULOCYTES: 0 %
LYMPHS: 42 %
Lymphocytes Absolute: 2.6 10*3/uL (ref 0.7–3.1)
MCH: 31.6 pg (ref 26.6–33.0)
MCHC: 33.6 g/dL (ref 31.5–35.7)
MCV: 94 fL (ref 79–97)
MONOCYTES: 3 %
MONOS ABS: 0.2 10*3/uL (ref 0.1–0.9)
Neutrophils Absolute: 3.3 10*3/uL (ref 1.4–7.0)
Neutrophils: 52 %
Platelets: 235 10*3/uL (ref 150–379)
RBC: 4.49 x10E6/uL (ref 4.14–5.80)
RDW: 14.2 % (ref 12.3–15.4)
WBC: 6.2 10*3/uL (ref 3.4–10.8)

## 2016-02-11 ENCOUNTER — Other Ambulatory Visit: Payer: Self-pay | Admitting: Family Medicine

## 2016-02-15 ENCOUNTER — Ambulatory Visit (INDEPENDENT_AMBULATORY_CARE_PROVIDER_SITE_OTHER): Payer: Medicare Other | Admitting: Neurology

## 2016-02-15 ENCOUNTER — Encounter: Payer: Self-pay | Admitting: Neurology

## 2016-02-15 VITALS — BP 124/82 | HR 57 | Ht 68.0 in | Wt 336.0 lb

## 2016-02-15 DIAGNOSIS — G4440 Drug-induced headache, not elsewhere classified, not intractable: Secondary | ICD-10-CM

## 2016-02-15 DIAGNOSIS — Q046 Congenital cerebral cysts: Secondary | ICD-10-CM | POA: Diagnosis not present

## 2016-02-15 DIAGNOSIS — F17200 Nicotine dependence, unspecified, uncomplicated: Secondary | ICD-10-CM

## 2016-02-15 DIAGNOSIS — M542 Cervicalgia: Secondary | ICD-10-CM

## 2016-02-15 DIAGNOSIS — G4441 Drug-induced headache, not elsewhere classified, intractable: Secondary | ICD-10-CM | POA: Diagnosis not present

## 2016-02-15 DIAGNOSIS — G44229 Chronic tension-type headache, not intractable: Secondary | ICD-10-CM

## 2016-02-15 DIAGNOSIS — G8194 Hemiplegia, unspecified affecting left nondominant side: Secondary | ICD-10-CM

## 2016-02-15 MED ORDER — TIZANIDINE HCL 2 MG PO TABS: 2.0000 mg | ORAL_TABLET | Freq: Three times a day (TID) | ORAL | Status: DC | PRN

## 2016-02-15 MED ORDER — GABAPENTIN 300 MG PO CAPS: 600.0000 mg | ORAL_CAPSULE | Freq: Two times a day (BID) | ORAL | Status: DC

## 2016-02-15 NOTE — Progress Notes (Addendum)
NEUROLOGY CONSULTATION NOTE  Roy [name] MRN: [id number] DOB: 04/18/1971  Referring provider: Dr. Darlyn Read Primary care provider: Dr. 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  Onset: He says two months ago, but has suffered from chronic headache for years. Location:  Bi-frontal/temporal Quality:  pressure Intensity:  3-4/10 Aura:  no Prodrome:  no Associated symptoms:  No nausea, photophobia, phonophobia, visual disturbance Duration:  45 to 60 minutes with BC Frequency:  Every other day to daily Triggers/exacerbating factors:  stress Relieving factors:  relaxation Activity:  functions  Past NSAIDS:  Ibuprofen, Aleve Past analgesics:  Tylenol, narcotic (not sure which one, but it helped) Past abortive triptans:  no Past muscle relaxants:  no Past anti-emetic:  no Past antihypertensive medications:  no Past antidepressant medications:  Paxil Past anticonvulsant medications:  no  Current NSAIDS:  no Current analgesics:  BCs (daily) Current triptans:  no Current anti-emetic:  no Current muscle relaxants:  no Current anti-anxiolytic:  alprazolam Current sleep aide:  trazodone Current Antihypertensive medications:  carvedilol Current Antidepressant medications:  Cymbalta  Current Anticonvulsant medications:  gabapentin  three times daily Current Vitamins/Herbal/Supplements:  fish oil Current Antihistamines/Decongestants:  no Other therapy:  no  He has history of cervical spine surgery about 15 to 20 years ago.  Last MRI of cervical spine from 03/04/15 was personally reviewed and showed anterior cervical fusion at C3-4 and C4-5, as well as mild left foraminal stenosis at C4-5 and mild broad-based disc bulge with moderate to severe right foraminal stenosis at C6-7.  He  continues to have left sided neck pain radiating into the shoulder but not into the arm or hand.  It is worse with neck turn to left.  No associated numbness.  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  He has residual left upper extremity weakness due to the cyst.  Caffeine:  Recently stopped Alcohol:  no Smoker:  yes Diet:  Soda, does not diet Exercise:  no Depression/stress:  stress Sleep hygiene:  Okay but naps during day Family history of headache:  Father and aunts  PAST MEDICAL HISTORY: Past Medical History  Diagnosis Date  . Ulnar neuropathy at elbow   . Radiculopathy of cervical region   . Median nerve dysfunction   . Weakness   . Anxiety   . Depression     PAST SURGICAL HISTORY: Past Surgical History  Procedure Laterality Date  . Rotator cuff repair Right   . Biceps tendon repair    . Neck surgery      Fusion done two seperate times    MEDICATIONS: Current Outpatient Prescriptions on File Prior to Visit  Medication Sig Dispense Refill  . ALPRAZolam (XANAX) 0.5 MG tablet Take 1 tablet (0.5 mg total) by mouth 3 (three) times daily as needed for anxiety (& panic). 90 tablet 5  . carvedilol (COREG) 12.5 MG tablet Take 1 tablet (12.5 mg total) by mouth 2 (two) times daily with a meal. 60 tablet 5  . DULoxetine (CYMBALTA) 60 MG capsule Take 2 capsules (120 mg total) by mouth daily. 60 capsule 5  . gemfibrozil (LOPID) 600 MG tablet TAKE ONE TABLET BY MOUTH TWICE DAILY BEFORE A MEAL 60 tablet 5  . levothyroxine (SYNTHROID, LEVOTHROID) 50 MCG tablet Take 1  tablet (50 mcg total) by mouth daily. 30 tablet 10  . Linaclotide (LINZESS) 290 MCG CAPS capsule Take 1 capsule (290 mcg total) by mouth daily. 30 capsule 2  . Omega-3 Fatty Acids (FISH OIL) 1000 MG CAPS Take 2 capsules by mouth daily.    Marland Kitchen. omeprazole (PRILOSEC) 40 MG capsule Take 40 mg by mouth daily.     . pantoprazole (PROTONIX) 40  MG tablet Take 1 tablet (40 mg total) by mouth 2 (two) times daily. 60 tablet 5  . Testosterone (ANDROGEL PUMP) 20.25 MG/ACT (1.62%) GEL Apply 4 pumps daily to upper chest and shoulders 300 g 5  . traZODone (DESYREL) 100 MG tablet TAKE ONE TABLET BY MOUTH ONCE DAILY AT BEDTIME 30 tablet 5   No current facility-administered medications on file prior to visit.    ALLERGIES: Allergies  Allergen Reactions  . Penicillins Rash    FAMILY HISTORY: Family History  Problem Relation Age of Onset  . Hypertension Mother   . Stroke Mother   . Arrhythmia Father     SOCIAL HISTORY: Social History   Social History  . Marital Status: Single    Spouse Name: N/A  . Number of Children: N/A  . Years of Education: N/A   Occupational History  . Not on file.   Social History Main Topics  . Smoking status: Current Every Day Smoker -- 0.50 packs/day    Types: Cigarettes  . Smokeless tobacco: Former NeurosurgeonUser    Types: Snuff, Chew  . Alcohol Use: No  . Drug Use: No  . Sexual Activity: Not on file   Other Topics Concern  . Not on file   Social History Narrative    REVIEW OF SYSTEMS: Constitutional: No fevers, chills, or sweats, no generalized fatigue, change in appetite Eyes: No visual changes, double vision, eye pain Ear, nose and throat: No hearing loss, ear pain, nasal congestion, sore throat Cardiovascular: No chest pain, palpitations Respiratory:  No shortness of breath at rest or with exertion, wheezes GastrointestinaI: No nausea, vomiting, diarrhea, abdominal pain, fecal incontinence Genitourinary:  No dysuria, urinary retention or frequency Musculoskeletal:  No neck pain, back pain Integumentary: No rash, pruritus, skin lesions Neurological: as above Psychiatric: No depression, insomnia, anxiety Endocrine: No palpitations, fatigue, diaphoresis, mood swings, change in appetite, change in weight, increased thirst Hematologic/Lymphatic:  No purpura, petechiae. Allergic/Immunologic:  no itchy/runny eyes, nasal congestion, recent allergic reactions, rashes  PHYSICAL EXAM: Filed Vitals:   02/15/16 0755  BP: 124/82  Pulse: 57   General: No acute distress.  Morbidly obese Head:  macrocephaly/atraumatic Eyes:  fundi examined but not visualized Neck: supple, no paraspinal tenderness, full range of motion Back: No paraspinal tenderness Heart: regular rate and rhythm Lungs: Clear to auscultation bilaterally. Vascular: No carotid bruits. Neurological Exam: Mental status: alert and oriented to person, place, and time, recent and remote memory intact, fund of knowledge intact, attention and concentration intact, speech fluent and not dysarthric, language intact. Cranial nerves: CN I: not tested CN II: pupils equal, round and reactive to light, visual fields intact CN III, IV, VI:  full range of motion, no nystagmus, no ptosis CN V: facial sensation intact CN VII: upper and lower face symmetric CN VIII: hearing intact CN IX, X: gag intact, uvula midline CN XI: sternocleidomastoid and trapezius muscles intact CN XII: tongue midline Bulk & Tone: normal, no fasciculations. Motor:  Decreased finger tapping on left.  5-/5 left hand grip. Otherwise, 5/5 throughout  Sensation: temperature and vibration sensation intact. Deep Tendon  Reflexes:  2+ in left upper extremity, otherwise absent throughout, toes downgoing. Finger to nose testing:  Without dysmetria.  Heel to shin:  Without dysmetria.  Gait:  Normal station and stride.  Able to turn but difficulty tandem walk. Romberg negative.  IMPRESSION: Chronic tension type headache complicated by medication overuse  Cervicalgia with radiculopathy.  There is mild foraminal narrowing at C4-5 on the left which may be contributing to neck pain.  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  Increase gabapentin to 600mg  twice daily 2.  Tizanidine 2mg  up to three times daily  as needed 3.  Stop BCs 4.  Weight loss 5.  Exercise 6.  Smoking cessation 7.  Follow up in 3 to 4 months but contact us in 4 weeks with update.  Thank you for allowing me to take part in the care of this patient.  Shon Millet, DO  CC:  Mechele Claude, MD

## 2016-02-15 NOTE — Patient Instructions (Signed)
1.  We will increase gabapentin to 600mg  (2 capsules) twice daily. 2.  STOP BCs 3.  For neck pain or headache, may take tizanidine 2mg  up to every 8 hours as needed.  Caution for drowsiness 4.  Improve diet and weight loss.  Stop soda 5.  Increase exercise 6.  CONTACT ME IN 4 WEEKS.  Follow up in 3-4 months.

## 2016-02-23 ENCOUNTER — Ambulatory Visit (INDEPENDENT_AMBULATORY_CARE_PROVIDER_SITE_OTHER): Payer: Medicare Other | Admitting: Family Medicine

## 2016-02-23 ENCOUNTER — Encounter: Payer: Self-pay | Admitting: Family Medicine

## 2016-02-23 VITALS — BP 115/71 | HR 60 | Temp 97.6°F | Ht 68.0 in | Wt 336.8 lb

## 2016-02-23 DIAGNOSIS — G930 Cerebral cysts: Secondary | ICD-10-CM

## 2016-02-23 DIAGNOSIS — R519 Headache, unspecified: Secondary | ICD-10-CM

## 2016-02-23 DIAGNOSIS — R51 Headache: Secondary | ICD-10-CM | POA: Diagnosis not present

## 2016-02-23 DIAGNOSIS — G8929 Other chronic pain: Secondary | ICD-10-CM

## 2016-02-23 NOTE — Progress Notes (Signed)
Subjective:  Patient ID: Roy [name], male    DOB: Feb 27, 1971  Age: 45 y.o. MRN: [id number]  CC: Headache and Back Pain   HPI Roy [name] presents for Using 2-3 goodies every day due to daily HA.  Needs the caffeine. Recently saw neurology. That report reviewed & attached. Anxiety attacks happening less frequently. Occurring frequently- 2-3 or more per week.Getting blurred vision with HA.Can't see road signs until very close. HA off & on for many years. FHx - Dad &sister both had frequently severe headache  HA is throbbing frontal to parietal bilateral. 7/10. Moderate relief with Goodies, but recurs after a few hours. He has backed off somewhat and denies abdominal discomfort. He is using the omeprazole. Left side of neck stiff and exacerbated HA. History Roy [name] has a past medical history of Ulnar neuropathy at elbow; Radiculopathy of cervical region; Median nerve dysfunction; Weakness; Anxiety; and Depression.   He has past surgical history that includes Rotator cuff repair (Right); Biceps tendon repair; and Neck surgery.   His family history includes Arrhythmia in his father; Hypertension in his mother; Stroke in his mother.He reports that he has been smoking Cigarettes.  He has been smoking about 0.50 packs per day. He has quit using smokeless tobacco. His smokeless tobacco use included Snuff and Chew. He reports that he does not drink alcohol or use illicit drugs.    ROS Review of Systems  Constitutional: Positive for fatigue. Negative for fever, chills, diaphoresis and unexpected weight change.  HENT: Negative for congestion, hearing loss, rhinorrhea and sore throat.   Eyes: Negative for visual disturbance.  Respiratory: Negative for cough and shortness of breath (related to inability to take a deep breath because of pannus).   Cardiovascular: Negative for chest pain.  Gastrointestinal: Negative for abdominal pain, diarrhea and constipation.  Genitourinary: Negative for  dysuria and flank pain.  Musculoskeletal: Positive for neck pain (radiating to left upper trapezius border. Constant). Negative for joint swelling and arthralgias.  Skin: Negative for rash.  Neurological: Positive for headaches. Negative for dizziness, tremors and weakness.  Psychiatric/Behavioral: Negative for sleep disturbance and dysphoric mood. The patient is nervous/anxious.     Objective:  BP 115/71 mmHg  Pulse 60  Temp(Src) 97.6 F (36.4 C) (Oral)  Ht  (1.727 m)  Wt 336 lb 12.8 oz (152.771 kg)  BMI 51.22 kg/m2  SpO2 97%  BP Readings from Last 3 Encounters:  02/23/16 115/71  02/15/16 124/82  01/22/16 128/89    Wt Readings from Last 3 Encounters:  02/23/16 336 lb 12.8 oz (152.771 kg)  02/15/16 336 lb (152.409 kg)  01/22/16 332 lb (150.594 kg)     Physical Exam  Constitutional: He is oriented to person, place, and time. He appears well-developed and well-nourished. No distress.  HENT:  Head: Normocephalic and atraumatic.  Right Ear: External ear normal.  Left Ear: External ear normal.  Nose: Nose normal.  Mouth/Throat: Oropharynx is clear and moist.  Eyes: Conjunctivae and EOM are normal. Pupils are equal, round, and reactive to light.  Neck: Normal range of motion. Neck supple. No thyromegaly present.  Cardiovascular: Normal rate, regular rhythm and normal heart sounds.   No murmur heard. Pulmonary/Chest: Effort normal and breath sounds normal. No respiratory distress. He has no wheezes. He has no rales.  Abdominal: Soft. Bowel sounds are normal. He exhibits no distension. There is no tenderness.  Lymphadenopathy:    He has no cervical adenopathy.  Neurological: He is alert and oriented to person,  place, and time. He has normal reflexes.  Skin: Skin is warm and dry.  Psychiatric: He has a normal mood and affect. His behavior is normal. Judgment and thought content normal.      Lab Results  Component Value Date   WBC 6.2 01/22/2016   HGB 13.7 12/20/2015    HCT 42.2 01/22/2016   PLT 235 01/22/2016   GLUCOSE 78 01/22/2016   CHOL 159 10/20/2015   TRIG 98 10/20/2015   HDL 36* 10/20/2015   LDLCALC 103* 10/20/2015   ALT 14 01/22/2016   AST 13 01/22/2016   NA 139 01/22/2016   K 4.1 01/22/2016   CL 97 01/22/2016   CREATININE 0.90 01/22/2016   BUN 15 01/22/2016   CO2 23 01/22/2016   TSH 1.840 10/20/2015   PSA 0.3 11/17/2014   HGBA1C 5.8% 11/17/2014    Dg Chest 2 View  12/20/2015  CLINICAL DATA:  Central and LEFT side chest pain for 3 days, occasional shortness of breath, history smoking, hypertension EXAM: CHEST  2 VIEW COMPARISON:  10/19/2015 FINDINGS: Upper normal heart size. Normal mediastinal contours and pulmonary vascularity. Ovoid nodular density projecting at LEFT upper lobe on PA view, on lateral view corresponding to an EKG lead on the anterior chest wall. Central peribronchial thickening with minimal RIGHT basilar atelectasis. No infiltrate, pleural effusion or pneumothorax. IMPRESSION: No definite acute abnormalities. Electronically Signed   By: Ulyses SouthwardMark  Boles M.D.   On: 12/20/2015 18:55    Assessment & Plan:   Roy [name] was seen today for headache and back pain.  Diagnoses and all orders for this visit:  Brain cyst -     MR Brain W Wo Contrast; Future  Chronic intractable headache, unspecified headache type  Morbid obesity, unspecified obesity type (HCC) -     MR Brain W Wo Contrast; Future   I am having Roy [name] maintain his Fish Oil, Testosterone, traZODone, gemfibrozil, ALPRAZolam, carvedilol, levothyroxine, linaclotide, pantoprazole, DULoxetine, omeprazole, tiZANidine, and gabapentin.  Follow-up: Return in about 1 month (around 03/24/2016).  Mechele ClaudeWarren Dereke Neumann, M.D.

## 2016-03-06 ENCOUNTER — Ambulatory Visit (HOSPITAL_COMMUNITY): Admission: RE | Admit: 2016-03-06 | Payer: Medicare Other | Source: Ambulatory Visit

## 2016-03-18 ENCOUNTER — Telehealth: Payer: Self-pay | Admitting: Family Medicine

## 2016-03-25 ENCOUNTER — Ambulatory Visit: Payer: Medicare Other | Admitting: Family Medicine

## 2016-03-27 ENCOUNTER — Other Ambulatory Visit: Payer: Self-pay | Admitting: Family Medicine

## 2016-03-27 DIAGNOSIS — G930 Cerebral cysts: Secondary | ICD-10-CM

## 2016-04-01 ENCOUNTER — Telehealth: Payer: Self-pay | Admitting: *Deleted

## 2016-04-01 ENCOUNTER — Ambulatory Visit
Admission: RE | Admit: 2016-04-01 | Discharge: 2016-04-01 | Disposition: A | Discharge status: Home / Self Care | Payer: Medicare HMO | Source: Ambulatory Visit | Attending: Family Medicine | Admitting: Family Medicine

## 2016-04-01 DIAGNOSIS — G930 Cerebral cysts: Secondary | ICD-10-CM

## 2016-04-01 MED ORDER — GADOBENATE DIMEGLUMINE 529 MG/ML IV SOLN
20.0000 mL | Freq: Once | INTRAVENOUS | Status: AC | PRN
  Administered 2016-04-01: 20 mL via INTRAVENOUS

## 2016-04-01 NOTE — Telephone Encounter (Signed)
Pt's mother notified of results Verbalizes understanding  

## 2016-04-01 NOTE — Telephone Encounter (Signed)
-----   Message from Mechele ClaudeWarren Stacks, MD sent at 04/01/2016 12:56 PM EDT ----- Lanice SchwabHello Macaulay, Your MRI shows that the cyst noted in the past is unchanged and appears benign. Best Regards, Mechele ClaudeWarren Stacks, M.D.

## 2016-04-04 ENCOUNTER — Ambulatory Visit (INDEPENDENT_AMBULATORY_CARE_PROVIDER_SITE_OTHER): Payer: Medicare HMO | Admitting: Family Medicine

## 2016-04-04 ENCOUNTER — Encounter: Payer: Self-pay | Admitting: Family Medicine

## 2016-04-04 VITALS — BP 120/70 | HR 62 | Temp 97.4°F | Ht 68.0 in | Wt 335.0 lb

## 2016-04-04 DIAGNOSIS — I10 Essential (primary) hypertension: Secondary | ICD-10-CM | POA: Diagnosis not present

## 2016-04-04 DIAGNOSIS — R519 Headache, unspecified: Secondary | ICD-10-CM

## 2016-04-04 DIAGNOSIS — R51 Headache: Secondary | ICD-10-CM | POA: Diagnosis not present

## 2016-04-04 DIAGNOSIS — G8929 Other chronic pain: Secondary | ICD-10-CM

## 2016-04-04 NOTE — Progress Notes (Signed)
Subjective:  Patient ID: Roy [name], male    DOB: 11/06/1970  Age: 45 y.o. MRN: [id number]  CC: MRI results and Sinusitis   HPI Roy [name] presents for Recheck of his headaches. Congested due to fan. Needs noise to sleep. Not wearing CPAP. Mask annoying.Has appt. With [facility] upcoming. Zanaflex gave no relief. History Roy SeniorStephen has a past medical history of Ulnar neuropathy at elbow; Radiculopathy of cervical region; Median nerve dysfunction; Weakness; Anxiety; and Depression.   He has past surgical history that includes Rotator cuff repair (Right); Biceps tendon repair; and Neck surgery.   His family history includes Arrhythmia in his father; Hypertension in his mother; Stroke in his mother.He reports that he has been smoking Cigarettes.  He has been smoking about 0.50 packs per day. He has quit using smokeless tobacco. His smokeless tobacco use included Snuff and Chew. He reports that he does not drink alcohol or use illicit drugs.    ROS Review of Systems  Constitutional: Negative for fever, chills and diaphoresis.  HENT: Negative for rhinorrhea and sore throat.   Respiratory: Negative for cough and shortness of breath.   Cardiovascular: Negative for chest pain.  Gastrointestinal: Negative for abdominal pain.  Musculoskeletal: Negative for myalgias and arthralgias.  Skin: Negative for rash.  Neurological: Positive for headaches. Negative for weakness.    Objective:  BP 120/70 mmHg  Pulse 62  Temp(Src) 97.4 F (36.3 C) (Oral)  Ht 5\' 8"  (1.727 [name])  Wt 335 lb (151.955 kg)  BMI 50.95 kg/m2  SpO2 97%  BP Readings from Last 3 Encounters:  04/04/16 120/70  02/23/16 115/71  02/15/16 124/82    Wt Readings from Last 3 Encounters:  04/04/16 335 lb (151.955 kg)  02/23/16 336 lb 12.8 oz (152.771 kg)  02/15/16 336 lb (152.409 kg)     Physical Exam  Constitutional: He appears well-developed and well-nourished.  HENT:  Head: Normocephalic and atraumatic.  Right  Ear: Tympanic membrane and external ear normal. No decreased hearing is noted.  Left Ear: Tympanic membrane and external ear normal. No decreased hearing is noted.  Mouth/Throat: No oropharyngeal exudate or posterior oropharyngeal erythema.  Eyes: Pupils are equal, round, and reactive to light.  Neck: Normal range of motion. Neck supple.  Cardiovascular: Normal rate and regular rhythm.   No murmur heard. Pulmonary/Chest: Breath sounds normal. No respiratory distress.  Abdominal: Soft. Bowel sounds are normal. He exhibits no mass. There is no tenderness.  Vitals reviewed.    Lab Results  Component Value Date   WBC 6.2 01/22/2016   HGB 13.7 12/20/2015   HCT 42.2 01/22/2016   PLT 235 01/22/2016   GLUCOSE 78 01/22/2016   CHOL 159 10/20/2015   TRIG 98 10/20/2015   HDL 36* 10/20/2015   LDLCALC 103* 10/20/2015   ALT 14 01/22/2016   AST 13 01/22/2016   NA 139 01/22/2016   K 4.1 01/22/2016   CL 97 01/22/2016   CREATININE 0.90 01/22/2016   BUN 15 01/22/2016   CO2 23 01/22/2016   TSH 1.840 10/20/2015   PSA 0.3 11/17/2014   HGBA1C 5.8% 11/17/2014    Mr Brain W Wo Contrast  04/01/2016  CLINICAL DATA:  Brain cyst. Morbid obesity. History of head injury 1990 [facility] at [address] Results: Creatinine 0.9 mg/dL. EXAM: MRI HEAD WITHOUT AND WITH CONTRAST TECHNIQUE: Multiplanar, multiecho pulse sequences of the brain and surrounding structures were obtained without and with intravenous contrast. CONTRAST:  20mL MULTIHANCE GADOBENATE DIMEGLUMINE 529 MG/ML IV SOLN COMPARISON:  CT head 06/13/2014 FINDINGS: Cyst in the right posterior frontal lobe measures 5.3 x 5.2 cm and is unchanged from the prior study. The cyst communicates with the right lateral ventricle. No abnormal enhancement and no restricted diffusion in the cyst. Minimal hyperintensity in the white matter surrounding the cyst. No associated hemorrhage. Negative for hydrocephalus.   Mild cerebral atrophy Negative for acute infarct. Mild chronic microvascular ischemic changes in the white matter. Postcontrast imaging demonstrates normal enhancement. The cyst does not show any abnormal enhancement. No other enhancing lesions identified. Normal vascular enhancement. IMPRESSION: 5.3 x 5.2 cm cyst in the right posterior frontal lobe is unchanged from prior CT and appears benign. The cyst communicates with the ventricle. 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 This cyst not show abnormal enhancement. Electronically Signed   By: Marlan Palau [name].D.   On: 04/01/2016 09:32    Assessment & Plan:   Roy [name] was seen today for mri results and sinusitis.  Diagnoses and all orders for this visit:  Chronic intractable headache, unspecified headache type  Benign essential HTN   I am having Mr. Grand maintain his Fish Oil, Testosterone, traZODone, gemfibrozil, ALPRAZolam, carvedilol, levothyroxine, linaclotide, pantoprazole, DULoxetine, omeprazole, tiZANidine, and gabapentin.  No orders of the defined types were placed in this encounter.     Follow-up: Return in about 3 months (around 07/05/2016).  Mechele Claude, [name].D.

## 2016-04-15 ENCOUNTER — Other Ambulatory Visit: Payer: Self-pay | Admitting: Family Medicine

## 2016-05-02 ENCOUNTER — Other Ambulatory Visit: Payer: Self-pay | Admitting: Family Medicine

## 2016-05-16 DIAGNOSIS — R42 Dizziness and giddiness: Secondary | ICD-10-CM | POA: Diagnosis not present

## 2016-05-16 DIAGNOSIS — K5720 Diverticulitis of large intestine with perforation and abscess without bleeding: Secondary | ICD-10-CM | POA: Diagnosis not present

## 2016-05-16 DIAGNOSIS — K5900 Constipation, unspecified: Secondary | ICD-10-CM | POA: Diagnosis not present

## 2016-05-16 DIAGNOSIS — J00 Acute nasopharyngitis [common cold]: Secondary | ICD-10-CM | POA: Diagnosis not present

## 2016-06-02 ENCOUNTER — Other Ambulatory Visit: Payer: Self-pay | Admitting: Family Medicine

## 2016-06-17 ENCOUNTER — Ambulatory Visit: Payer: Medicare Other | Admitting: Neurology

## 2016-06-18 ENCOUNTER — Other Ambulatory Visit: Payer: Self-pay | Admitting: Family Medicine

## 2016-06-24 ENCOUNTER — Encounter: Payer: Self-pay | Admitting: Family Medicine

## 2016-06-24 ENCOUNTER — Ambulatory Visit (INDEPENDENT_AMBULATORY_CARE_PROVIDER_SITE_OTHER): Payer: Medicare HMO | Admitting: Family Medicine

## 2016-06-24 ENCOUNTER — Other Ambulatory Visit: Payer: Self-pay | Admitting: Family Medicine

## 2016-06-24 ENCOUNTER — Telehealth: Payer: Self-pay | Admitting: Family Medicine

## 2016-06-24 VITALS — BP 124/78 | HR 61 | Temp 97.1°F | Ht 68.0 in | Wt 337.8 lb

## 2016-06-24 DIAGNOSIS — B9789 Other viral agents as the cause of diseases classified elsewhere: Secondary | ICD-10-CM

## 2016-06-24 DIAGNOSIS — J069 Acute upper respiratory infection, unspecified: Secondary | ICD-10-CM | POA: Diagnosis not present

## 2016-06-24 MED ORDER — FLUTICASONE PROPIONATE 50 MCG/ACT NA SUSP: 2.0000 | Freq: Every day | NASAL | 6 refills | Status: DC

## 2016-06-24 NOTE — Progress Notes (Signed)
   HPI  Patient presents today here with acute illness.  Notes 6 days of cough, congestion, chest congestion, and rhinorrhea.  He complains of cough intermittently productive of clear to green sputum He denies shortness of breath, chest pain, fevers, chills, and sweats.  He completed an old azithromycin course that he had, he started after 1 day duration of symptoms. States this did not help.  He is tolerating food and fluids normally, he denies shortness of breath.  PMH: Smoking status noted ROS: Per HPI  Objective: BP 124/78   Pulse 61   Temp 97.1 F (36.2 C) (Oral)   Ht 5\' 8"  (1.727 m)   Wt (!) 337 lb 12.8 oz (153.2 kg)   BMI 51.36 kg/m  Gen: NAD, alert, cooperative with exam HEENT: NCAT, nares with swelling, TMs normal bilaterally, oropharynx clear Neck: No tender lymphadenopathy CV: RRR, good S1/S2, no murmur Resp: CTABL, no wheezes, non-labored Ext: No edema, warm Neuro: Alert and oriented, No gross deficits  Assessment and plan:  # Viral URI with cough Most likely viral etiology, no clear indication for additional antibiotics at this time Patient has completed an azithromycin course at home. I recommended over-the-counter cough medications including Mucinex DM I also recommended using Flonase for nasal congestion    Meds ordered this encounter  Medications  . fluticasone (FLONASE) 50 MCG/ACT nasal spray    Sig: Place 2 sprays into both nostrils daily.    Dispense:  16 g    Refill:  6    Murtis SinkSam Kamiah Fite, MD [facility] 06/24/2016, 10:09 AM

## 2016-06-24 NOTE — Patient Instructions (Signed)
Great to see you!  Start flonase today, take mucinex DM 12 hour or robitussin DM for your cough.   If you have worsening symptoms please return to the clinic for evaluation.   Viral Infections A viral infection can be caused by different types of viruses.Most viral infections are not serious and resolve on their own. However, some infections may cause severe symptoms and may lead to further complications. SYMPTOMS Viruses can frequently cause:  Minor sore throat.  Aches and pains.  Headaches.  Runny nose.  Different types of rashes.  Watery eyes.  Tiredness.  Cough.  Loss of appetite.  Gastrointestinal infections, resulting in nausea, vomiting, and diarrhea. These symptoms do not respond to antibiotics because the infection is not caused by bacteria. However, you might catch a bacterial infection following the viral infection. This is sometimes called a "superinfection." Symptoms of such a bacterial infection may include:  Worsening sore throat with pus and difficulty swallowing.  Swollen neck glands.  Chills and a high or persistent fever.  Severe headache.  Tenderness over the sinuses.  Persistent overall ill feeling (malaise), muscle aches, and tiredness (fatigue).  Persistent cough.  Yellow, green, or brown mucus production with coughing. HOME CARE INSTRUCTIONS   Only take over-the-counter or prescription medicines for pain, discomfort, diarrhea, or fever as directed by your caregiver.  Drink enough water and fluids to keep your urine clear or pale yellow. Sports drinks can provide valuable electrolytes, sugars, and hydration.  Get plenty of rest and maintain proper nutrition. Soups and broths with crackers or rice are fine. SEEK IMMEDIATE MEDICAL CARE IF:   You have severe headaches, shortness of breath, chest pain, neck pain, or an unusual rash.  You have uncontrolled vomiting, diarrhea, or you are unable to keep down fluids.  You or your child has  an oral temperature above 102 F (38.9 C), not controlled by medicine.  Your baby is older than 3 months with a rectal temperature of 102 F (38.9 C) or higher.  Your baby is 623 months old or younger with a rectal temperature of 100.4 F (38 C) or higher. MAKE SURE YOU:   Understand these instructions.  Will watch your condition.  Will get help right away if you are not doing well or get worse.   This information is not intended to replace advice given to you by your health care provider. Make sure you discuss any questions you have with your health care provider.   Document Released: 06/19/2005 Document Revised: 12/02/2011 Document Reviewed: 02/15/2015 Elsevier Interactive Patient Education [employer]2016 [employer]

## 2016-06-24 NOTE — Telephone Encounter (Signed)
Please review and advise Pt has appt 07/05/2016

## 2016-06-25 ENCOUNTER — Other Ambulatory Visit: Payer: Self-pay | Admitting: Family Medicine

## 2016-06-25 ENCOUNTER — Other Ambulatory Visit: Payer: Self-pay | Admitting: Nurse Practitioner

## 2016-06-25 ENCOUNTER — Telehealth: Payer: Self-pay | Admitting: Family Medicine

## 2016-06-25 MED ORDER — ALPRAZOLAM 0.5 MG PO TABS: 0.5000 mg | ORAL_TABLET | Freq: Three times a day (TID) | ORAL | 0 refills | Status: DC | PRN

## 2016-06-25 NOTE — Telephone Encounter (Signed)
Rx called into pharmacy and pt is aware. 

## 2016-06-25 NOTE — Telephone Encounter (Signed)
Pt aware [facility] has an appt for 10/13 but is out of meds, made appt for tomorrow 10/4

## 2016-06-25 NOTE — Telephone Encounter (Signed)
Mother is aware that [facility]

## 2016-06-25 NOTE — Telephone Encounter (Signed)
Prescription written for 30 pills. Please call in.

## 2016-06-26 ENCOUNTER — Ambulatory Visit (INDEPENDENT_AMBULATORY_CARE_PROVIDER_SITE_OTHER): Payer: Medicare HMO | Admitting: Family Medicine

## 2016-06-26 ENCOUNTER — Telehealth: Payer: Self-pay | Admitting: *Deleted

## 2016-06-26 ENCOUNTER — Telehealth: Payer: Self-pay | Admitting: Family Medicine

## 2016-06-26 ENCOUNTER — Encounter: Payer: Self-pay | Admitting: Family Medicine

## 2016-06-26 VITALS — BP 118/73 | HR 57 | Temp 97.6°F | Ht 68.0 in | Wt 339.8 lb

## 2016-06-26 DIAGNOSIS — F411 Generalized anxiety disorder: Secondary | ICD-10-CM

## 2016-06-26 DIAGNOSIS — E038 Other specified hypothyroidism: Secondary | ICD-10-CM

## 2016-06-26 DIAGNOSIS — Z720 Tobacco use: Secondary | ICD-10-CM

## 2016-06-26 DIAGNOSIS — J329 Chronic sinusitis, unspecified: Secondary | ICD-10-CM | POA: Diagnosis not present

## 2016-06-26 DIAGNOSIS — J40 Bronchitis, not specified as acute or chronic: Secondary | ICD-10-CM

## 2016-06-26 DIAGNOSIS — I10 Essential (primary) hypertension: Secondary | ICD-10-CM

## 2016-06-26 DIAGNOSIS — E349 Endocrine disorder, unspecified: Secondary | ICD-10-CM

## 2016-06-26 MED ORDER — ALPRAZOLAM 0.5 MG PO TABS
0.5000 mg | ORAL_TABLET | Freq: Three times a day (TID) | ORAL | 5 refills | Status: DC | PRN
Start: 2016-06-26 — End: 2016-12-30

## 2016-06-26 MED ORDER — BETAMETHASONE SOD PHOS & ACET 6 (3-3) MG/ML IJ SUSP
12.0000 mg | Freq: Once | INTRAMUSCULAR | Status: AC
  Administered 2016-06-26: 6 mg via INTRAMUSCULAR

## 2016-06-26 MED ORDER — LEVOFLOXACIN 500 MG PO TABS: 500.0000 mg | ORAL_TABLET | Freq: Every day | ORAL | 0 refills | Status: DC

## 2016-06-26 NOTE — Telephone Encounter (Signed)
Script for xanax called to [facility] voice mail per Dr. Darlyn ReadStacks.

## 2016-06-26 NOTE — Progress Notes (Signed)
Subjective:  Patient ID: Roy [name], male    DOB: [birth year]  Age: 45 y.o. MRN: [id number]  CC: Medication Refill (xanax) and Sinus Problem   HPI Roy [name] presents for Symptoms include congestion, facial pain, nasal congestion, no  fever, non productive cough, post nasal drip and sinus pressure with no fever, chills, night sweats or weight loss. Onset of symptoms was a few days ago, gradually worsening since that time. Pt.is drinking moderate amounts of fluids.     follow-up of hypertension. Patient has no history of headache chest pain or shortness of breath or recent cough. Patient also denies symptoms of TIA such as numbness weakness lateralizing. Patient checks  blood pressure at home and has not had any elevated readings recently. Patient denies side effects from his medication. States taking it regularly. Patient presents for follow-up on  thyroid. The patient has a history of hypothyroidism for many years. It has been stable recently. Pt. denies any change in  voice, loss of hair, heat or cold intolerance. Energy level has been adequate to good. Patient denies constipation and diarrhea. No myxedema. Medication is as noted below. Verified that pt is taking it daily on an empty stomach. Well tolerated.  Follow-up testosterone: Continues to use medication regularly. No complaints regarding side effects. Feels muscle tone is good. No signs of blood clotting bruising etc.  Past depression and anxiety symptoms are stable currently. He denies any adverse effects from his medication. Continues to take them as noted below. GAD& and pH Q9 are reviewed and listed below. GAD 7 : Generalized Anxiety Score 06/26/2016 04/04/2016 02/23/2016 01/22/2016  Nervous, Anxious, on Edge 1 0 0 0  Control/stop worrying 0 '2 2 2  ' Worry too much - different things '3 2 2 2  ' Trouble relaxing 0 0 0 1  Restless 0 0 0 0  Easily annoyed or irritable '1 1 1 1  ' Afraid - awful might happen 1 0 0 0  Total GAD 7 Score '6  5 5 6  ' Anxiety Difficulty Not difficult at all Not difficult at all Not difficult at all Not difficult at all   Depression screen [facility] 2/9 06/26/2016 06/24/2016 04/04/2016 02/23/2016 01/22/2016  Decreased Interest 0 1 0 0 0  Down, Depressed, Hopeless 0 3 0 0 0  PHQ - 2 Score 0 4 0 0 0  Altered sleeping - 0 - - -  Tired, decreased energy - 2 - - -  Change in appetite - 3 - - -  Feeling bad or failure about yourself  - 0 - - -  Trouble concentrating - 0 - - -  Moving slowly or fidgety/restless - 0 - - -  Suicidal thoughts - 0 - - -  PHQ-9 Score - 9 - - -  Difficult doing work/chores - Not difficult at all - - -     History Roy [name] has a past medical history of Anxiety; Depression; Median nerve dysfunction; Radiculopathy of cervical region; Ulnar neuropathy at elbow; and Weakness.   He has a past surgical history that includes Rotator cuff repair (Right); Biceps tendon repair; and Neck surgery.   His family history includes Arrhythmia in his father; Hypertension in his mother; Stroke in his mother.He reports that he has been smoking Cigarettes.  He has been smoking about 0.50 packs per day. He has quit using smokeless tobacco. His smokeless tobacco use included Snuff and Chew. He reports that he does not drink alcohol or use drugs.    ROS  Review of Systems  Constitutional: Negative for activity change, appetite change, chills, diaphoresis, fever and unexpected weight change.  HENT: Positive for congestion, postnasal drip, rhinorrhea and sinus pressure. Negative for ear discharge, ear pain, hearing loss, nosebleeds, sneezing, sore throat and trouble swallowing.   Eyes: Negative for visual disturbance.  Respiratory: Negative for cough, chest tightness and shortness of breath.   Cardiovascular: Negative for chest pain and palpitations.  Gastrointestinal: Negative for abdominal pain, constipation and diarrhea.  Genitourinary: Negative for dysuria and flank pain.  Musculoskeletal: Negative for  arthralgias and joint swelling.  Skin: Negative for rash.  Neurological: Negative for dizziness and headaches.  Psychiatric/Behavioral: Negative for dysphoric mood and sleep disturbance.    Objective:  BP 118/73   Pulse (!) 57   Temp 97.6 F (36.4 C) (Oral)   Ht '5\' 8"'  (1.727 m)   Wt (!) 339 lb 12.8 oz (154.1 kg)   BMI 51.67 kg/m   BP Readings from Last 3 Encounters:  06/26/16 118/73  06/24/16 124/78  04/04/16 120/70    Wt Readings from Last 3 Encounters:  06/26/16 (!) 339 lb 12.8 oz (154.1 kg)  06/24/16 (!) 337 lb 12.8 oz (153.2 kg)  04/04/16 (!) 335 lb (152 kg)     Physical Exam  Constitutional: He is oriented to person, place, and time. He appears well-developed and well-nourished. No distress.  HENT:  Head: Normocephalic and atraumatic.  Right Ear: External ear normal.  Left Ear: External ear normal.  Nose: Nose normal.  Mouth/Throat: Oropharynx is clear and moist.  Eyes: Conjunctivae and EOM are normal. Pupils are equal, round, and reactive to light.  Neck: Normal range of motion. Neck supple. No thyromegaly present.  Cardiovascular: Normal rate, regular rhythm and normal heart sounds.   No murmur heard. Pulmonary/Chest: Effort normal and breath sounds normal. No respiratory distress. He has no wheezes. He has no rales.  Abdominal: Soft. Bowel sounds are normal. He exhibits no distension. There is no tenderness.  Lymphadenopathy:    He has no cervical adenopathy.  Neurological: He is alert and oriented to person, place, and time. He has normal reflexes.  Skin: Skin is warm and dry.  Psychiatric: He has a normal mood and affect. His behavior is normal. Judgment and thought content normal.     Lab Results  Component Value Date   WBC 6.2 01/22/2016   HGB 13.7 12/20/2015   HCT 42.2 01/22/2016   PLT 235 01/22/2016   GLUCOSE 78 01/22/2016   CHOL 159 10/20/2015   TRIG 98 10/20/2015   HDL 36 (L) 10/20/2015   LDLCALC 103 (H) 10/20/2015   ALT 14 01/22/2016    AST 13 01/22/2016   NA 139 01/22/2016   K 4.1 01/22/2016   CL 97 01/22/2016   CREATININE 0.90 01/22/2016   BUN 15 01/22/2016   CO2 23 01/22/2016   TSH 1.840 10/20/2015   PSA 0.3 11/17/2014   HGBA1C 5.8% 11/17/2014    Mr Brain W PR Contrast  Result Date: 04/01/2016 CLINICAL DATA:  Brain cyst. Morbid obesity. History of head injury 1990 Creatinine was obtained on site at [facility] at [address] Results: Creatinine 0.9 mg/dL. EXAM: MRI HEAD WITHOUT AND WITH CONTRAST TECHNIQUE: Multiplanar, multiecho pulse sequences of the brain and surrounding structures were obtained without and with intravenous contrast. CONTRAST:  8m MULTIHANCE GADOBENATE DIMEGLUMINE 529 MG/ML IV SOLN COMPARISON:  CT head 06/13/2014 FINDINGS: Cyst in the right posterior frontal lobe measures 5.3 x 5.2 cm and is unchanged from  the prior study. The cyst communicates with the right lateral ventricle. No abnormal enhancement and no restricted diffusion in the cyst. Minimal hyperintensity in the white matter surrounding the cyst. No associated hemorrhage. Negative for hydrocephalus.  Mild cerebral atrophy Negative for acute infarct. Mild chronic microvascular ischemic changes in the white matter. Postcontrast imaging demonstrates normal enhancement. The cyst does not show any abnormal enhancement. No other enhancing lesions identified. Normal vascular enhancement. IMPRESSION: 5.3 x 5.2 cm cyst in the right posterior frontal lobe is unchanged from prior CT and appears benign. The cyst communicates with the ventricle. 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 This cyst not show abnormal enhancement. Electronically Signed   By: Franchot Gallo M.D.   On: 04/01/2016 09:32    Assessment & Plan:   Roy [name] was seen today for medication refill and sinus problem.  Diagnoses and all orders for this visit:  Benign essential HTN -     CBC with Differential/Platelet -      CMP14+EGFR  Other specified hypothyroidism -     CBC with Differential/Platelet -     CMP14+EGFR -     TSH + free T4  Nicotine abuse -     CBC with Differential/Platelet -     CMP14+EGFR  Hypotestosteronism -     CBC with Differential/Platelet -     CMP14+EGFR -     Testosterone,Free and Total  Morbid obesity (HCC) -     CBC with Differential/Platelet -     CMP14+EGFR  Sinobronchitis -     CBC with Differential/Platelet -     CMP14+EGFR -     betamethasone acetate-betamethasone sodium phosphate (CELESTONE) injection 12 mg; Inject 2 mLs (12 mg total) into the muscle once.  GAD (generalized anxiety disorder) -     CBC with Differential/Platelet -     CMP14+EGFR  Other orders -     ALPRAZolam (XANAX) 0.5 MG tablet; Take 1 tablet (0.5 mg total) by mouth 3 (three) times daily as needed for anxiety (& panic). -     levofloxacin (LEVAQUIN) 500 MG tablet; Take 1 tablet (500 mg total) by mouth daily.      I have discontinued Mr. Eves omeprazole and tiZANidine. I am also having him start on levofloxacin. Additionally, I am having him maintain his Fish Oil, Testosterone, levothyroxine, linaclotide, gabapentin, traZODone, gemfibrozil, pantoprazole, DULoxetine, carvedilol, fluticasone, and ALPRAZolam. We administered betamethasone acetate-betamethasone sodium phosphate.  Meds ordered this encounter  Medications  . ALPRAZolam (XANAX) 0.5 MG tablet    Sig: Take 1 tablet (0.5 mg total) by mouth 3 (three) times daily as needed for anxiety (& panic).    Dispense:  90 tablet    Refill:  5  . betamethasone acetate-betamethasone sodium phosphate (CELESTONE) injection 12 mg  . levofloxacin (LEVAQUIN) 500 MG tablet    Sig: Take 1 tablet (500 mg total) by mouth daily.    Dispense:  7 tablet    Refill:  0     Follow-up: Return in about 2 weeks (around 07/10/2016) for COPD.  Claretta Fraise, M.D.

## 2016-06-26 NOTE — Telephone Encounter (Signed)
Patient aware.

## 2016-06-26 NOTE — Telephone Encounter (Signed)
I sent in the requested prescription 

## 2016-06-27 LAB — CMP14+EGFR
A/G RATIO: 1.9 (ref 1.2–2.2)
ALBUMIN: 4.8 g/dL (ref 3.5–5.5)
ALK PHOS: 80 IU/L (ref 39–117)
ALT: 19 IU/L (ref 0–44)
AST: 19 IU/L (ref 0–40)
BUN / CREAT RATIO: 13 (ref 9–20)
BUN: 11 mg/dL (ref 6–24)
CO2: 21 mmol/L (ref 18–29)
CREATININE: 0.88 mg/dL (ref 0.76–1.27)
Calcium: 9.4 mg/dL (ref 8.7–10.2)
Chloride: 99 mmol/L (ref 96–106)
GFR calc Af Amer: 122 mL/min/{1.73_m2} (ref >59)
GFR calc non Af Amer: 105 mL/min/{1.73_m2} (ref >59)
GLOBULIN, TOTAL: 2.5 g/dL (ref 1.5–4.5)
Glucose: 102 mg/dL — ABNORMAL HIGH (ref 65–99)
POTASSIUM: 4.4 mmol/L (ref 3.5–5.2)
SODIUM: 139 mmol/L (ref 134–144)
Total Protein: 7.3 g/dL (ref 6.0–8.5)

## 2016-06-27 LAB — CBC WITH DIFFERENTIAL/PLATELET
BASOS: 1 %
Basophils Absolute: 0.1 10*3/uL (ref 0.0–0.2)
EOS (ABSOLUTE): 0.2 10*3/uL (ref 0.0–0.4)
EOS: 3 %
HEMATOCRIT: 41.2 % (ref 37.5–51.0)
HEMOGLOBIN: 14.4 g/dL (ref 12.6–17.7)
Immature Grans (Abs): 0 10*3/uL (ref 0.0–0.1)
Immature Granulocytes: 1 %
LYMPHS ABS: 2.3 10*3/uL (ref 0.7–3.1)
Lymphs: 34 %
MCH: 31.8 pg (ref 26.6–33.0)
MCHC: 35 g/dL (ref 31.5–35.7)
MCV: 91 fL (ref 79–97)
MONOCYTES: 6 %
MONOS ABS: 0.4 10*3/uL (ref 0.1–0.9)
NEUTROS ABS: 3.9 10*3/uL (ref 1.4–7.0)
Neutrophils: 55 %
Platelets: 269 10*3/uL (ref 150–379)
RBC: 4.53 x10E6/uL (ref 4.14–5.80)
RDW: 14.2 % (ref 12.3–15.4)
WBC: 6.9 10*3/uL (ref 3.4–10.8)

## 2016-06-27 LAB — TSH+FREE T4
Free T4: 0.86 ng/dL (ref 0.82–1.77)
TSH: 1.9 u[IU]/mL (ref 0.450–4.500)

## 2016-06-27 LAB — TESTOSTERONE,FREE AND TOTAL
TESTOSTERONE: 309 ng/dL (ref 264–916)
Testosterone, Free: 7.7 pg/mL (ref 6.8–21.5)

## 2016-06-28 ENCOUNTER — Telehealth: Payer: Self-pay | Admitting: Family Medicine

## 2016-07-01 NOTE — Telephone Encounter (Signed)
Please contact the patient : Labs were all essentially normal

## 2016-07-01 NOTE — Telephone Encounter (Signed)
Aware of normal lab results. 

## 2016-07-01 NOTE — Telephone Encounter (Signed)
Patient requesting lab results.  Please review. Thank you

## 2016-07-02 ENCOUNTER — Other Ambulatory Visit: Payer: Self-pay | Admitting: Family Medicine

## 2016-07-05 ENCOUNTER — Ambulatory Visit: Payer: Medicare HMO | Admitting: Family Medicine

## 2016-07-10 ENCOUNTER — Encounter: Payer: Self-pay | Admitting: Family Medicine

## 2016-07-10 ENCOUNTER — Ambulatory Visit (INDEPENDENT_AMBULATORY_CARE_PROVIDER_SITE_OTHER): Payer: Medicare HMO | Admitting: Family Medicine

## 2016-07-10 VITALS — BP 110/64 | HR 60 | Temp 98.5°F | Ht 68.0 in | Wt 340.0 lb

## 2016-07-10 DIAGNOSIS — J439 Emphysema, unspecified: Secondary | ICD-10-CM | POA: Diagnosis not present

## 2016-07-10 DIAGNOSIS — R0600 Dyspnea, unspecified: Secondary | ICD-10-CM

## 2016-07-10 DIAGNOSIS — R0789 Other chest pain: Secondary | ICD-10-CM

## 2016-07-10 DIAGNOSIS — Z720 Tobacco use: Secondary | ICD-10-CM | POA: Diagnosis not present

## 2016-07-10 MED ORDER — BUDESONIDE 180 MCG/ACT IN AEPB: 2.0000 | INHALATION_SPRAY | Freq: Two times a day (BID) | RESPIRATORY_TRACT | 5 refills | Status: DC

## 2016-07-10 NOTE — Progress Notes (Signed)
Subjective:  Patient ID: Roy GarfinkelStephen M Ortego, male    DOB: 04/16/1971  Age: 45 y.o. MRN: [id number]  CC: COPD (pt here today for a 2 week follow up to check for COPD)   HPI Roy [name] presents for Concerns about shortness of breath. He is a smoker one half pack day for many years. He has been having some vague discomfort in his chest it is nonradiating it is nonexertional. It is not positional. He describes it as a heaviness. He has had 2 stress tests in the last year that were negative. He is concerned that there may be an element of anxiety. He says that it seems to gotten worse over the last couple of days when he began thinking about coming back here for evaluation he does have some shortness of breath with this chest pain but not just those times. He does not have a cough. He is concerned about wheezing. This occurs intermittently.Marland Kitchen. GAD 7 : Generalized Anxiety Score 06/26/2016 04/04/2016 02/23/2016 01/22/2016  Nervous, Anxious, on Edge 1 0 0 0  Control/stop worrying 0 2 2 2   Worry too much - different things 3 2 2 2   Trouble relaxing 0 0 0 1  Restless 0 0 0 0  Easily annoyed or irritable 1 1 1 1   Afraid - awful might happen 1 0 0 0  Total GAD 7 Score 6 5 5 6   Anxiety Difficulty Not difficult at all Not difficult at all Not difficult at all Not difficult at all      History Jeannett SeniorStephen has a past medical history of Anxiety; Depression; Median nerve dysfunction; Radiculopathy of cervical region; Ulnar neuropathy at elbow; and Weakness.   He has a past surgical history that includes Rotator cuff repair (Right); Biceps tendon repair; and Neck surgery.   His family history includes Arrhythmia in his father; Hypertension in his mother; Stroke in his mother.He reports that he has been smoking Cigarettes.  He has been smoking about 0.50 packs per day. He has quit using smokeless tobacco. His smokeless tobacco use included Snuff and Chew. He reports that he does not drink alcohol or use  drugs.    ROS Review of Systems  Constitutional: Negative for chills, diaphoresis, fever and unexpected weight change.  HENT: Negative for congestion, hearing loss, rhinorrhea and sore throat.   Eyes: Negative for visual disturbance.  Respiratory: Positive for chest tightness, shortness of breath and wheezing. Negative for cough.   Cardiovascular: Positive for chest pain.  Gastrointestinal: Negative for abdominal pain, constipation and diarrhea.  Genitourinary: Negative for dysuria and flank pain.  Musculoskeletal: Negative for arthralgias and joint swelling.  Skin: Negative for rash.  Neurological: Negative for dizziness and headaches.  Psychiatric/Behavioral: Negative for agitation, dysphoric mood and sleep disturbance. The patient is nervous/anxious.     Objective:  BP 110/64   Pulse 60   Temp 98.5 F (36.9 C) (Oral)   Ht 5\' 8"  (1.727 m)   Wt (!) 340 lb (154.2 kg)   BMI 51.70 kg/m   BP Readings from Last 3 Encounters:  07/10/16 110/64  06/26/16 118/73  06/24/16 124/78    Wt Readings from Last 3 Encounters:  07/10/16 (!) 340 lb (154.2 kg)  06/26/16 (!) 339 lb 12.8 oz (154.1 kg)  06/24/16 (!) 337 lb 12.8 oz (153.2 kg)     Physical Exam  Constitutional: He is oriented to person, place, and time. He appears well-developed and well-nourished. No distress.  HENT:  Head: Normocephalic and atraumatic.  Right Ear: External ear normal.  Left Ear: External ear normal.  Nose: Nose normal.  Mouth/Throat: Oropharynx is clear and moist.  Eyes: Conjunctivae and EOM are normal. Pupils are equal, round, and reactive to light.  Neck: Normal range of motion. Neck supple. No thyromegaly present.  Cardiovascular: Normal rate, regular rhythm and normal heart sounds.   No murmur heard. Pulmonary/Chest: Effort normal and breath sounds normal. No respiratory distress. He has no wheezes. He has no rales.  Abdominal: Soft. Bowel sounds are normal. He exhibits no distension. There is no  tenderness.  Lymphadenopathy:    He has no cervical adenopathy.  Neurological: He is alert and oriented to person, place, and time. He has normal reflexes.  Skin: Skin is warm and dry.  Psychiatric: He has a normal mood and affect. His behavior is normal. Judgment and thought content normal.     Lab Results  Component Value Date   WBC 6.9 06/26/2016   HGB 13.7 12/20/2015   HCT 41.2 06/26/2016   PLT 269 06/26/2016   GLUCOSE 102 (H) 06/26/2016   CHOL 159 10/20/2015   TRIG 98 10/20/2015   HDL 36 (L) 10/20/2015   LDLCALC 103 (H) 10/20/2015   ALT 19 06/26/2016   AST 19 06/26/2016   NA 139 06/26/2016   K 4.4 06/26/2016   CL 99 06/26/2016   CREATININE 0.88 06/26/2016   BUN 11 06/26/2016   CO2 21 06/26/2016   TSH 1.900 06/26/2016   PSA 0.3 11/17/2014   HGBA1C 5.8% 11/17/2014    Mr Brain W WU Contrast  Result Date: 04/01/2016 CLINICAL DATA:  Brain cyst. Morbid obesity. History of head injury 1990 Creatinine was obtained on site at [facility] at [address] Results: Creatinine 0.9 mg/dL. EXAM: MRI HEAD WITHOUT AND WITH CONTRAST TECHNIQUE: Multiplanar, multiecho pulse sequences of the brain and surrounding structures were obtained without and with intravenous contrast. CONTRAST:  20mL MULTIHANCE GADOBENATE DIMEGLUMINE 529 MG/ML IV SOLN COMPARISON:  CT head 06/13/2014 FINDINGS: Cyst in the right posterior frontal lobe measures 5.3 x 5.2 cm and is unchanged from the prior study. The cyst communicates with the right lateral ventricle. No abnormal enhancement and no restricted diffusion in the cyst. Minimal hyperintensity in the white matter surrounding the cyst. No associated hemorrhage. Negative for hydrocephalus.  Mild cerebral atrophy Negative for acute infarct. Mild chronic microvascular ischemic changes in the white matter. Postcontrast imaging demonstrates normal enhancement. The cyst does not show any abnormal enhancement. No other enhancing lesions identified. Normal  vascular enhancement. IMPRESSION: 5.3 x 5.2 cm cyst in the right posterior frontal lobe is unchanged from prior CT and appears benign. The cyst communicates with the ventricle. 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 This cyst not show abnormal enhancement. Electronically Signed   By: Marlan Palau M.D.   On: 04/01/2016 09:32    Assessment & Plan:   Kylor was seen today for copd.  Diagnoses and all orders for this visit:  Dyspnea, unspecified type -     PR BREATHING CAPACITY TEST  Chest tightness or pressure -     EKG 12-Lead  Pulmonary emphysema, unspecified emphysema type (HCC) -     budesonide (PULMICORT FLEXHALER) 180 MCG/ACT inhaler; Inhale 2 puffs into the lungs 2 (two) times daily.  Morbid obesity (HCC)  Nicotine abuse  EKG shows sinus bradycardia. Normal sinus rhythm with no ischemic changes. No conduction delays or abnormal intervals are segments. Recent stress echo with imaging  enhancement shows ejection fraction 55-60% and mild right atrial dilatation. Myoview 1 year ago 12/01/2014. Imaging read As normal  Pulmonary function testing performed today shows that he has mostly restriction which is markedly improved after bronchodilator.  I have discontinued Mr. Plante levofloxacin. I am also having him start on budesonide. Additionally, I am having him maintain his Fish Oil, Testosterone, levothyroxine, linaclotide, gabapentin, traZODone, gemfibrozil, pantoprazole, DULoxetine, carvedilol, fluticasone, and ALPRAZolam.  Meds ordered this encounter  Medications  . budesonide (PULMICORT FLEXHALER) 180 MCG/ACT inhaler    Sig: Inhale 2 puffs into the lungs 2 (two) times daily.    Dispense:  1 Inhaler    Refill:  5    With regard to his breathing we discussed in detail the need to stop smoking and the need to lose weight. Follow-up: Return in about 6 weeks (around 08/21/2016).  Mechele Claude, M.D.

## 2016-07-11 ENCOUNTER — Telehealth: Payer: Self-pay

## 2016-07-12 ENCOUNTER — Other Ambulatory Visit: Payer: Self-pay | Admitting: Family Medicine

## 2016-07-12 MED ORDER — FLUTICASONE PROPIONATE HFA 110 MCG/ACT IN AERO: 2.0000 | INHALATION_SPRAY | Freq: Two times a day (BID) | RESPIRATORY_TRACT | 12 refills | Status: DC

## 2016-07-12 NOTE — Telephone Encounter (Signed)
I sent in the requested prescription 

## 2016-07-14 ENCOUNTER — Other Ambulatory Visit: Payer: Self-pay | Admitting: Family Medicine

## 2016-07-26 IMAGING — CR DG CHEST 2V
2 series · 2 of 2 positions shown · non-contrast
Comparison: 12/20/2015

CLINICAL DATA: Left chest pain

EXAM:
CHEST  2 VIEW

[view not recorded (1 of 2)]
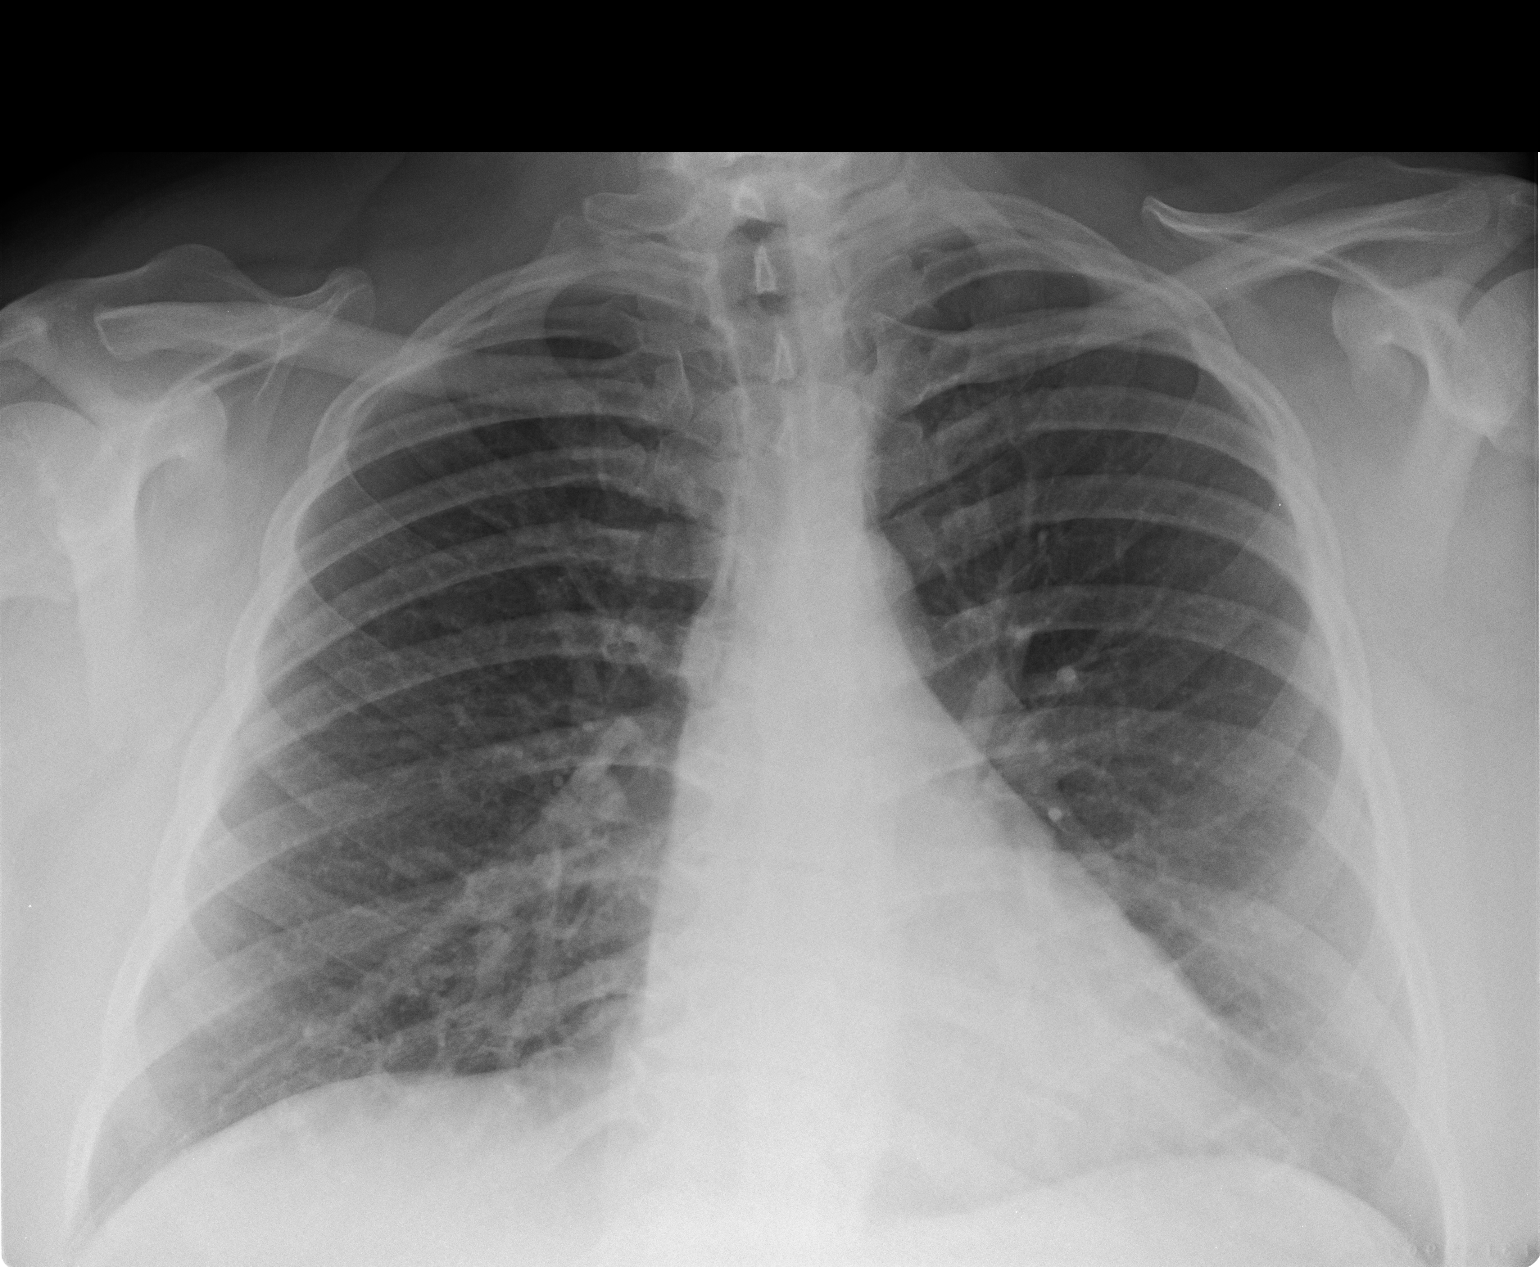

[view not recorded (2 of 2)]
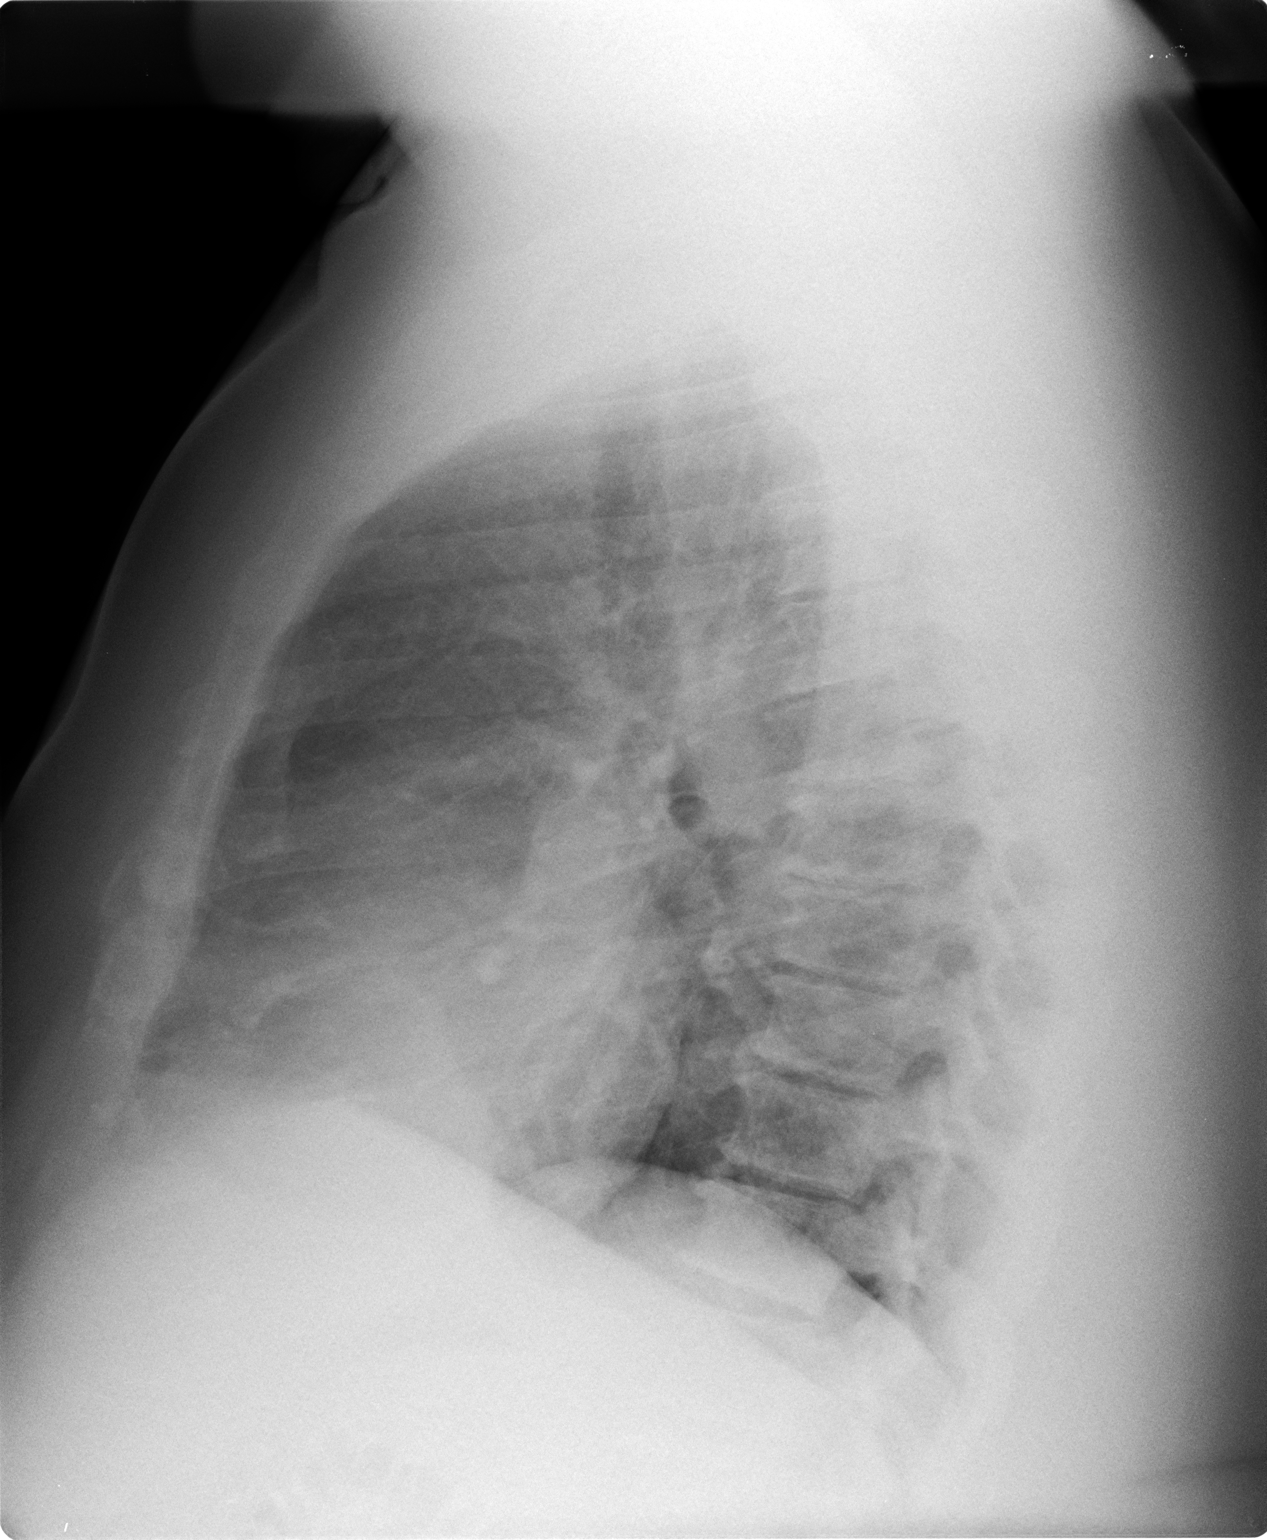

[2 of 2 positions shown; findings below may reference images not displayed]

FINDINGS: Heart and mediastinal contours are within normal limits. No focal
opacities or effusions. No acute bony abnormality.
IMPRESSION: No active cardiopulmonary disease.

## 2016-08-01 ENCOUNTER — Other Ambulatory Visit: Payer: Self-pay | Admitting: Family Medicine

## 2016-08-10 ENCOUNTER — Other Ambulatory Visit: Payer: Self-pay | Admitting: Family Medicine

## 2016-08-26 ENCOUNTER — Ambulatory Visit (INDEPENDENT_AMBULATORY_CARE_PROVIDER_SITE_OTHER): Payer: Medicare HMO | Admitting: Family Medicine

## 2016-08-26 ENCOUNTER — Encounter: Payer: Self-pay | Admitting: Family Medicine

## 2016-08-26 DIAGNOSIS — I10 Essential (primary) hypertension: Secondary | ICD-10-CM

## 2016-08-26 DIAGNOSIS — J439 Emphysema, unspecified: Secondary | ICD-10-CM | POA: Diagnosis not present

## 2016-08-26 DIAGNOSIS — F411 Generalized anxiety disorder: Secondary | ICD-10-CM

## 2016-08-26 MED ORDER — BUDESONIDE-FORMOTEROL FUMARATE 160-4.5 MCG/ACT IN AERO: 2.0000 | INHALATION_SPRAY | Freq: Two times a day (BID) | RESPIRATORY_TRACT | 3 refills | Status: DC

## 2016-08-26 NOTE — Progress Notes (Signed)
Subjective:  Patient ID: Roy [name], male    DOB: 07/03/1971  Age: 45 y.o. MRN: [id number]  CC: Follow-up (6 wk rck)   HPI Roy GarfinkelStephen M Zingaro presents for About his brother who has quit taking his bipolar medicines. Apparently he is acting out quite a bit. He continues to scare his wife who wants him out of their home. His mother doesn't want him staying with her. Patient shows up at his sister's restaurant and scares her. Please pick them up over the weekend but that him go.  Recheck of dyspnea. Reviewed PFT - much improved post test last visit. Started on steroid inhaler. It has reduced his wheezing. However, he hasn't noticed much difference. His concern is that he has no energy. He doesn't even feel like getting up to take a shower. We reviewed his blood work showing thyroid and other functions in normal range. This was just done 6-8 weeks ago. He is frustrated by gaining 4 more pounds since last visit. History Roy [name] has a past medical history of Anxiety; Depression; Median nerve dysfunction; Radiculopathy of cervical region; Ulnar neuropathy at elbow; and Weakness.   He has a past surgical history that includes Rotator cuff repair (Right); Biceps tendon repair; and Neck surgery.   His family history includes Arrhythmia in his father; Hypertension in his mother; Stroke in his mother.He reports that he has been smoking Cigarettes.  He has been smoking about 0.50 packs per day. He has quit using smokeless tobacco. His smokeless tobacco use included Snuff and Chew. He reports that he does not drink alcohol or use drugs.    ROS Review of Systems  Objective:  BP (!) 144/89   Pulse 62   Ht 5\' 8"  (1.727 m)   Wt (!) 344 lb (156 kg)   BMI 52.31 kg/m   BP Readings from Last 3 Encounters:  08/26/16 (!) 144/89  07/10/16 110/64  06/26/16 118/73    Wt Readings from Last 3 Encounters:  08/26/16 (!) 344 lb (156 kg)  07/10/16 (!) 340 lb (154.2 kg)  06/26/16 (!) 339 lb 12.8 oz (154.1  kg)     Physical Exam   Lab Results  Component Value Date   WBC 6.9 06/26/2016   HGB 13.7 12/20/2015   HCT 41.2 06/26/2016   PLT 269 06/26/2016   GLUCOSE 102 (H) 06/26/2016   CHOL 159 10/20/2015   TRIG 98 10/20/2015   HDL 36 (L) 10/20/2015   LDLCALC 103 (H) 10/20/2015   ALT 19 06/26/2016   AST 19 06/26/2016   NA 139 06/26/2016   K 4.4 06/26/2016   CL 99 06/26/2016   CREATININE 0.88 06/26/2016   BUN 11 06/26/2016   CO2 21 06/26/2016   TSH 1.900 06/26/2016   PSA 0.3 11/17/2014   HGBA1C 5.8% 11/17/2014    Mr Brain W Wo Contrast  Result Date: 04/01/2016 CLINICAL DATA:  Brain cyst. Morbid obesity. History of head injury 1990 [facility] at [address] Results: Creatinine 0.9 mg/dL. EXAM: MRI HEAD WITHOUT AND WITH CONTRAST TECHNIQUE: Multiplanar, multiecho pulse sequences of the brain and surrounding structures were obtained without and with intravenous contrast. CONTRAST:  20mL MULTIHANCE GADOBENATE DIMEGLUMINE 529 MG/ML IV SOLN COMPARISON:  CT head 06/13/2014 FINDINGS: Cyst in the right posterior frontal lobe measures 5.3 x 5.2 cm and is unchanged from the prior study. The cyst communicates with the right lateral ventricle. No abnormal enhancement and no restricted diffusion in the cyst. Minimal  hyperintensity in the white matter surrounding the cyst. No associated hemorrhage. Negative for hydrocephalus.  Mild cerebral atrophy Negative for acute infarct. Mild chronic microvascular ischemic changes in the white matter. Postcontrast imaging demonstrates normal enhancement. The cyst does not show any abnormal enhancement. No other enhancing lesions identified. Normal vascular enhancement. IMPRESSION: 5.3 x 5.2 cm cyst in the right posterior frontal lobe is unchanged from prior CT and appears benign. The cyst communicates with the ventricle. 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 This  cyst not show abnormal enhancement. Electronically Signed   By: Marlan Palauharles  Clark M.D.   On: 04/01/2016 09:32    Assessment & Plan:   Roy [name] was seen today for follow-up.  Diagnoses and all orders for this visit:  Morbid obesity (HCC)  Benign essential HTN  GAD (generalized anxiety disorder)  Pulmonary emphysema, unspecified emphysema type (HCC)  Other orders -     budesonide-formoterol (SYMBICORT) 160-4.5 MCG/ACT inhaler; Inhale 2 puffs into the lungs 2 (two) times daily. For COPD/Asthma    Nutrition consult with Henrene Pastorammy Eckard  I am having Roy [name] start on budesonide-formoterol. I am also having him maintain his Fish Oil, Testosterone, levothyroxine, linaclotide, gabapentin, gemfibrozil, pantoprazole, DULoxetine, carvedilol, fluticasone, ALPRAZolam, and traZODone.  Meds ordered this encounter  Medications  . budesonide-formoterol (SYMBICORT) 160-4.5 MCG/ACT inhaler    Sig: Inhale 2 puffs into the lungs 2 (two) times daily. For COPD/Asthma    Dispense:  3 Inhaler    Refill:  3     Follow-up: Return in about 6 weeks (around 10/07/2016).  Mechele ClaudeWarren Kirstein Baxley, M.D.

## 2016-09-04 ENCOUNTER — Encounter: Payer: Medicare HMO | Admitting: Pharmacist

## 2016-09-26 ENCOUNTER — Other Ambulatory Visit: Payer: Self-pay | Admitting: Family Medicine

## 2016-10-07 ENCOUNTER — Ambulatory Visit (INDEPENDENT_AMBULATORY_CARE_PROVIDER_SITE_OTHER): Payer: Medicare HMO | Admitting: Family Medicine

## 2016-10-07 ENCOUNTER — Encounter: Payer: Self-pay | Admitting: Family Medicine

## 2016-10-07 VITALS — BP 104/63 | HR 53 | Temp 97.2°F | Ht 68.0 in | Wt 343.0 lb

## 2016-10-07 DIAGNOSIS — R51 Headache: Secondary | ICD-10-CM | POA: Diagnosis not present

## 2016-10-07 DIAGNOSIS — K279 Peptic ulcer, site unspecified, unspecified as acute or chronic, without hemorrhage or perforation: Secondary | ICD-10-CM | POA: Diagnosis not present

## 2016-10-07 DIAGNOSIS — R519 Headache, unspecified: Secondary | ICD-10-CM

## 2016-10-07 MED ORDER — SUCRALFATE 1 G PO TABS: 1.0000 g | ORAL_TABLET | Freq: Three times a day (TID) | ORAL | 1 refills | Status: DC

## 2016-10-07 MED ORDER — TOPIRAMATE 50 MG PO TABS: 50.0000 mg | ORAL_TABLET | Freq: Two times a day (BID) | ORAL | 2 refills | Status: DC

## 2016-10-07 NOTE — Progress Notes (Signed)
Subjective:  Patient ID: Roy GarfinkelStephen M Raschke, male    DOB: 03/05/1971  Age: 46 y.o. MRN: [id number]  CC: 6 week recheck (pt here today following up with his SOB which he says has improved and he now has c/o acid reflux?)   HPI Roy GarfinkelStephen M Kurowski presents for Frequent headaches. He says the stress of dealing with his bipolar brother who is not taking his medicine continues to the to headaches. Those headaches are moderately severe so he takes BC powders. He relates that he's having substernal chest pain and epigastric sharp pain. Specifically he ate some seasoned rice from broach ankles yesterday that caused a lot of discomfort. He takes Tums with temporary relief. However the symptoms are returning daily. An EGD in the past that returned with diagnosis of peptic ulceration. He was advised at that time to stop the BCP outer ears. Unfortunately the headaches are so severe that he is seeking a temporal remedy to avoid that pain.   History Jeannett SeniorStephen has a past medical history of Anxiety; Depression; Median nerve dysfunction; Radiculopathy of cervical region; Ulnar neuropathy at elbow; and Weakness.   He has a past surgical history that includes Rotator cuff repair (Right); Biceps tendon repair; and Neck surgery.   His family history includes Arrhythmia in his father; Hypertension in his mother; Stroke in his mother.He reports that he has been smoking Cigarettes.  He has been smoking about 0.50 packs per day. He has quit using smokeless tobacco. His smokeless tobacco use included Snuff and Chew. He reports that he does not drink alcohol or use drugs.    ROS Review of Systems  Constitutional: Negative for chills, diaphoresis, fever and unexpected weight change.  HENT: Negative for congestion, hearing loss, rhinorrhea and sore throat.   Eyes: Negative for visual disturbance.  Respiratory: Negative for cough and shortness of breath.   Cardiovascular: Positive for chest pain.  Gastrointestinal: Positive for  abdominal pain. Negative for constipation and diarrhea.  Genitourinary: Negative for dysuria and flank pain.  Musculoskeletal: Negative for arthralgias and joint swelling.  Skin: Negative for rash.  Neurological: Negative for dizziness and headaches.  Psychiatric/Behavioral: Negative for dysphoric mood and sleep disturbance.    Objective:  BP 104/63   Pulse (!) 53   Temp 97.2 F (36.2 C) (Oral)   Ht 5\' 8"  (1.727 m)   Wt (!) 343 lb (155.6 kg)   SpO2 98%   BMI 52.15 kg/m   BP Readings from Last 3 Encounters:  10/07/16 104/63  08/26/16 116/73  07/10/16 110/64    Wt Readings from Last 3 Encounters:  10/07/16 (!) 343 lb (155.6 kg)  08/26/16 (!) 344 lb (156 kg)  07/10/16 (!) 340 lb (154.2 kg)     Physical Exam  Constitutional: He is oriented to person, place, and time. He appears well-developed and well-nourished. No distress.  HENT:  Head: Normocephalic and atraumatic.  Right Ear: External ear normal.  Left Ear: External ear normal.  Nose: Nose normal.  Mouth/Throat: Oropharynx is clear and moist.  Eyes: Conjunctivae and EOM are normal. Pupils are equal, round, and reactive to light.  Neck: Normal range of motion. Neck supple. No thyromegaly present.  Cardiovascular: Normal rate, regular rhythm and normal heart sounds.   No murmur heard. Pulmonary/Chest: Effort normal and breath sounds normal. No respiratory distress. He has no wheezes. He has no rales.  Abdominal: Soft. Bowel sounds are normal. He exhibits no distension. There is tenderness (epigatsrum).  Lymphadenopathy:    He has no cervical  adenopathy.  Neurological: He is alert and oriented to person, place, and time. He has normal reflexes.  Skin: Skin is warm and dry.  Psychiatric: He has a normal mood and affect. His behavior is normal. Judgment and thought content normal.    Mr Laqueta Jean Wo Contrast  Result Date: 04/01/2016 CLINICAL DATA:  Brain cyst. Morbid obesity. History of head injury 1990 Creatinine was  obtained on site at [facility] at [address] Results: Creatinine 0.9 mg/dL. EXAM: MRI HEAD WITHOUT AND WITH CONTRAST TECHNIQUE: Multiplanar, multiecho pulse sequences of the brain and surrounding structures were obtained without and with intravenous contrast. CONTRAST:  20mL MULTIHANCE GADOBENATE DIMEGLUMINE 529 MG/ML IV SOLN COMPARISON:  CT head 06/13/2014 FINDINGS: Cyst in the right posterior frontal lobe measures 5.3 x 5.2 cm and is unchanged from the prior study. The cyst communicates with the right lateral ventricle. No abnormal enhancement and no restricted diffusion in the cyst. Minimal hyperintensity in the white matter surrounding the cyst. No associated hemorrhage. Negative for hydrocephalus.  Mild cerebral atrophy Negative for acute infarct. Mild chronic microvascular ischemic changes in the white matter. Postcontrast imaging demonstrates normal enhancement. The cyst does not show any abnormal enhancement. No other enhancing lesions identified. Normal vascular enhancement. IMPRESSION: 5.3 x 5.2 cm cyst in the right posterior frontal lobe is unchanged from prior CT and appears benign. The cyst communicates with the ventricle. 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 This cyst not show abnormal enhancement. Electronically Signed   By: Marlan Palau M.D.   On: 04/01/2016 09:32    Assessment & Plan:   Darreon was seen today for 6 week recheck.  Diagnoses and all orders for this visit:  Chronic intractable headache, unspecified headache type  PUD (peptic ulcer disease)  Other orders -     sucralfate (CARAFATE) 1 g tablet; Take 1 tablet (1 g total) by mouth 4 (four) times daily -  with meals and at bedtime. To heal ulcer -     topiramate (TOPAMAX) 50 MG tablet; Take 1 tablet (50 mg total) by mouth 2 (two) times daily. 1 q hs X1 wk, then 2hs X 1 wk. Then three qhs X 1 wk, then 4 qhs      I am having Mr. Sidman start on  sucralfate and topiramate. I am also having him maintain his Fish Oil, Testosterone, levothyroxine, linaclotide, gemfibrozil, pantoprazole, DULoxetine, carvedilol, fluticasone, ALPRAZolam, traZODone, budesonide-formoterol, gabapentin, and aspirin EC.  Allergies as of 10/07/2016      Reactions   Penicillins Rash      Medication List       Accurate as of 10/07/16  8:55 AM. Always use your most recent med list.          ALPRAZolam 0.5 MG tablet Commonly known as:  XANAX Take 1 tablet (0.5 mg total) by mouth 3 (three) times daily as needed for anxiety (& panic).   aspirin EC 81 MG tablet Take 81 mg by mouth daily.   budesonide-formoterol 160-4.5 MCG/ACT inhaler Commonly known as:  SYMBICORT Inhale 2 puffs into the lungs 2 (two) times daily. For COPD/Asthma   carvedilol 12.5 MG tablet Commonly known as:  COREG TAKE ONE TABLET BY MOUTH TWICE DAILY WITH MEALS   DULoxetine 60 MG capsule Commonly known as:  CYMBALTA TAKE TWO CAPSULES BY MOUTH ONCE DAILY   Fish Oil 1000 MG Caps Take 2 capsules by mouth daily.   fluticasone 50 MCG/ACT nasal spray Commonly known  as:  FLONASE Place 2 sprays into both nostrils daily.   gabapentin 300 MG capsule Commonly known as:  NEURONTIN TAKE ONE CAPSULE BY MOUTH THREE TIMES DAILY   gemfibrozil 600 MG tablet Commonly known as:  LOPID TAKE ONE TABLET BY MOUTH TWICE DAILY BEFORE A MEAL   levothyroxine 50 MCG tablet Commonly known as:  SYNTHROID, LEVOTHROID Take 1 tablet (50 mcg total) by mouth daily.   linaclotide 290 MCG Caps capsule Commonly known as:  LINZESS Take 1 capsule (290 mcg total) by mouth daily.   pantoprazole 40 MG tablet Commonly known as:  PROTONIX TAKE ONE TABLET BY MOUTH TWICE DAILY   sucralfate 1 g tablet Commonly known as:  CARAFATE Take 1 tablet (1 g total) by mouth 4 (four) times daily -  with meals and at bedtime. To heal ulcer   Testosterone 20.25 MG/ACT (1.62%) Gel Commonly known as:  ANDROGEL PUMP Apply 4  pumps daily to upper chest and shoulders   topiramate 50 MG tablet Commonly known as:  TOPAMAX Take 1 tablet (50 mg total) by mouth 2 (two) times daily. 1 q hs X1 wk, then 2hs X 1 wk. Then three qhs X 1 wk, then 4 qhs   traZODone 100 MG tablet Commonly known as:  DESYREL TAKE ONE TABLET BY MOUTH ONCE DAILY AT BEDTIME        Follow-up: Return in about 3 weeks (around 10/28/2016).  Mechele Claude, M.D.

## 2016-10-11 ENCOUNTER — Other Ambulatory Visit: Payer: Self-pay | Admitting: Family Medicine

## 2016-10-22 ENCOUNTER — Telehealth: Payer: Self-pay | Admitting: Family Medicine

## 2016-10-22 NOTE — Telephone Encounter (Signed)
Please contact the patient Have him try using it just twice daily. Increase water intake!

## 2016-10-28 ENCOUNTER — Encounter: Payer: Self-pay | Admitting: Family Medicine

## 2016-10-28 ENCOUNTER — Ambulatory Visit (INDEPENDENT_AMBULATORY_CARE_PROVIDER_SITE_OTHER): Payer: Medicare HMO | Admitting: Family Medicine

## 2016-10-28 VITALS — BP 123/67 | HR 52 | Temp 97.5°F | Ht 68.0 in | Wt 349.0 lb

## 2016-10-28 DIAGNOSIS — K5904 Chronic idiopathic constipation: Secondary | ICD-10-CM

## 2016-10-28 DIAGNOSIS — E039 Hypothyroidism, unspecified: Secondary | ICD-10-CM

## 2016-10-28 DIAGNOSIS — E038 Other specified hypothyroidism: Secondary | ICD-10-CM

## 2016-10-28 DIAGNOSIS — K279 Peptic ulcer, site unspecified, unspecified as acute or chronic, without hemorrhage or perforation: Secondary | ICD-10-CM | POA: Diagnosis not present

## 2016-10-28 NOTE — Progress Notes (Signed)
Subjective:  Patient ID: Roy [name], male    DOB: [birth year]  Age: 46 y.o. MRN: [id number]  CC: Follow-up (pt here today c/o constipation so he stopped taking the carafate thinking it was causing it, but he is still having constipation)   HPI Roy [name] presents for increased constipation. Not relieved by linzess since starting the carafate. Carafate may have helped the GERD, but has worsened his constipation. Pt. Relies on Linzess, but oc asionally has had to use Mag Citrate and the "salt water stuff." Has seen GI, Roy [name] for this in the past. Pt. DCed carafate but constipation continues. Still having reflux - has to sit up at night to avoid heartburn coming up into his throat.   History Roy [name] has a past medical history of Anxiety; Depression; Median nerve dysfunction; Radiculopathy of cervical region; Ulnar neuropathy at elbow; and Weakness.   He has a past surgical history that includes Rotator cuff repair (Right); Biceps tendon repair; and Neck surgery.   His family history includes Arrhythmia in his father; Hypertension in his mother; Stroke in his mother.He reports that he has been smoking Cigarettes.  He has been smoking about 0.50 packs per day. He has quit using smokeless tobacco. His smokeless tobacco use included Snuff and Chew. He reports that he does not drink alcohol or use drugs.    ROS Review of Systems  Constitutional: Negative for chills, diaphoresis and fever.  HENT: Negative for rhinorrhea and sore throat.   Respiratory: Negative for cough and shortness of breath.   Cardiovascular: Negative for chest pain.  Gastrointestinal: Positive for abdominal pain (LLQ -  when backed up) and constipation.  Musculoskeletal: Negative for arthralgias and myalgias.  Skin: Negative for rash.  Neurological: Negative for weakness and headaches.    Objective:  BP 123/67   Pulse (!) 52   Temp 97.5 F (36.4 C) (Oral)   Ht '5\' 8"'  (1.727 m)   Wt (!) 349 lb (158.3 kg)    BMI 53.07 kg/m   BP Readings from Last 3 Encounters:  10/28/16 123/67  10/07/16 104/63  08/26/16 116/73    Wt Readings from Last 3 Encounters:  10/28/16 (!) 349 lb (158.3 kg)  10/07/16 (!) 343 lb (155.6 kg)  08/26/16 (!) 344 lb (156 kg)     Physical Exam  Constitutional: He is oriented to person, place, and time. He appears well-developed and well-nourished.  HENT:  Head: Normocephalic and atraumatic.  Right Ear: External ear normal.  Left Ear: External ear normal.  Mouth/Throat: No oropharyngeal exudate or posterior oropharyngeal erythema.  Eyes: Pupils are equal, round, and reactive to light.  Neck: Normal range of motion. Neck supple.  Cardiovascular: Normal rate and regular rhythm.   No murmur heard. Pulmonary/Chest: Breath sounds normal. No respiratory distress.  Abdominal: Soft. Bowel sounds are normal. He exhibits no distension.  Neurological: He is alert and oriented to person, place, and time.  Vitals reviewed.   Roy Roy [name]  Result Date: 04/01/2016 CLINICAL DATA:  Brain cyst. Morbid obesity. History of head injury 1990 Creatinine was obtained on site at [facility] at [address] Results: Creatinine 0.9 mg/dL. EXAM: MRI HEAD WITHOUT AND WITH [name] TECHNIQUE: Multiplanar, multiecho pulse sequences of the brain and surrounding structures were obtained without and with intravenous [name]. [name]:  85m MULTIHANCE GADOBENATE DIMEGLUMINE 529 MG/ML IV SOLN COMPARISON:  CT head 06/13/2014 FINDINGS: Cyst in the right posterior frontal lobe measures 5.3 x 5.2 cm and is unchanged  from the prior study. The cyst communicates with the right lateral ventricle. No abnormal enhancement and no restricted diffusion in the cyst. Minimal hyperintensity in the white matter surrounding the cyst. No associated hemorrhage. Negative for hydrocephalus.  Mild cerebral atrophy Negative for acute infarct. Mild chronic microvascular ischemic changes in the white  matter. Postcontrast imaging demonstrates normal enhancement. The cyst does not show any abnormal enhancement. No other enhancing lesions identified. Normal vascular enhancement. IMPRESSION: 5.3 x 5.2 cm cyst in the right posterior frontal lobe is unchanged from prior CT and appears benign. The cyst communicates with the ventricle. 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 This cyst not show abnormal enhancement. Electronically Signed   By: Franchot Gallo M.D.   On: 04/01/2016 09:32    Assessment & Plan:   Roy [name] was seen today for follow-up.  Diagnoses and all orders for this visit:  Hypothyroidism, unspecified type -     CBC with Differential/Platelet -     CMP14+EGFR -     TSH + free T4  Other specified hypothyroidism  PUD (peptic ulcer disease) -     Ambulatory referral to Gastroenterology -     CBC with Differential/Platelet -     CMP14+EGFR -     TSH + free T4  Morbid obesity ([place])  Chronic idiopathic constipation -     Ambulatory referral to Gastroenterology -     CBC with Differential/Platelet -     CMP14+EGFR -     TSH + free T4      I am having Roy [name] maintain his Fish Oil, Testosterone, levothyroxine, linaclotide, gemfibrozil, pantoprazole, DULoxetine, carvedilol, fluticasone, ALPRAZolam, budesonide-formoterol, gabapentin, aspirin EC, sucralfate, topiramate, and traZODone.  Allergies as of 10/28/2016      Reactions   Penicillins Rash      Medication List       Accurate as of 10/28/16  8:50 AM. Always use your most recent med list.          ALPRAZolam 0.5 MG tablet Commonly known as:  XANAX Take 1 tablet (0.5 mg total) by mouth 3 (three) times daily as needed for anxiety (& panic).   aspirin EC 81 MG tablet Take 81 mg by mouth daily.   budesonide-formoterol 160-4.5 MCG/ACT inhaler Commonly known as:  SYMBICORT Inhale 2 puffs into the lungs 2 (two) times daily. For COPD/Asthma   carvedilol 12.5 MG  tablet Commonly known as:  COREG TAKE ONE TABLET BY MOUTH TWICE DAILY WITH MEALS   DULoxetine 60 MG capsule Commonly known as:  CYMBALTA TAKE TWO CAPSULES BY MOUTH ONCE DAILY   Fish Oil 1000 MG Caps Take 2 capsules by mouth daily.   fluticasone 50 MCG/ACT nasal spray Commonly known as:  FLONASE Place 2 sprays into both nostrils daily.   gabapentin 300 MG capsule Commonly known as:  NEURONTIN TAKE ONE CAPSULE BY MOUTH THREE TIMES DAILY   gemfibrozil 600 MG tablet Commonly known as:  LOPID TAKE ONE TABLET BY MOUTH TWICE DAILY BEFORE A MEAL   levothyroxine 50 MCG tablet Commonly known as:  SYNTHROID, LEVOTHROID Take 1 tablet (50 mcg total) by mouth daily.   linaclotide 290 MCG Caps capsule Commonly known as:  LINZESS Take 1 capsule (290 mcg total) by mouth daily.   pantoprazole 40 MG tablet Commonly known as:  PROTONIX TAKE ONE TABLET BY MOUTH TWICE DAILY   sucralfate 1 g tablet Commonly known as:  CARAFATE Take 1 tablet (1 g total) by mouth  4 (four) times daily -  with meals and at bedtime. To heal ulcer   Testosterone 20.25 MG/ACT (1.62%) Gel Commonly known as:  ANDROGEL PUMP Apply 4 pumps daily to upper chest and shoulders   topiramate 50 MG tablet Commonly known as:  TOPAMAX Take 1 tablet (50 mg total) by mouth 2 (two) times daily. 1 q hs X1 wk, then 2hs X 1 wk. Then three qhs X 1 wk, then 4 qhs   traZODone 100 MG tablet Commonly known as:  DESYREL TAKE ONE TABLET BY MOUTH ONCE DAILY AT BEDTIME        Follow-up: Return in about 3 months (around 01/25/2017).  Claretta Fraise, M.D.

## 2016-10-28 NOTE — Patient Instructions (Signed)
Use magnesium citrate 1 bottle daily for 3 days or until good results from your bowel movement.  Use MiraLAX 1 capful twice daily.  Do not increase the Linzess. Do not take the sucralfate.

## 2016-10-29 LAB — CBC WITH DIFFERENTIAL/PLATELET
BASOS: 1 %
Basophils Absolute: 0.1 10*3/uL (ref 0.0–0.2)
EOS (ABSOLUTE): 0.1 10*3/uL (ref 0.0–0.4)
EOS: 2 %
HEMATOCRIT: 39.1 % (ref 37.5–51.0)
Hemoglobin: 13 g/dL (ref 13.0–17.7)
Immature Grans (Abs): 0 10*3/uL (ref 0.0–0.1)
Immature Granulocytes: 0 %
LYMPHS ABS: 2.1 10*3/uL (ref 0.7–3.1)
Lymphs: 36 %
MCH: 30.6 pg (ref 26.6–33.0)
MCHC: 33.2 g/dL (ref 31.5–35.7)
MCV: 92 fL (ref 79–97)
Monocytes Absolute: 0.4 10*3/uL (ref 0.1–0.9)
Monocytes: 6 %
Neutrophils Absolute: 3.1 10*3/uL (ref 1.4–7.0)
Neutrophils: 55 %
Platelets: 248 10*3/uL (ref 150–379)
RBC: 4.25 x10E6/uL (ref 4.14–5.80)
RDW: 14.4 % (ref 12.3–15.4)
WBC: 5.7 10*3/uL (ref 3.4–10.8)

## 2016-10-29 LAB — CMP14+EGFR
A/G RATIO: 2.1 (ref 1.2–2.2)
ALK PHOS: 70 IU/L (ref 39–117)
ALT: 16 IU/L (ref 0–44)
AST: 14 IU/L (ref 0–40)
Albumin: 4.7 g/dL (ref 3.5–5.5)
BUN/Creatinine Ratio: 15 (ref 9–20)
BUN: 12 mg/dL (ref 6–24)
Bilirubin Total: 0.2 mg/dL (ref 0.0–1.2)
CO2: 19 mmol/L (ref 18–29)
Calcium: 9.5 mg/dL (ref 8.7–10.2)
Chloride: 104 mmol/L (ref 96–106)
Creatinine, Ser: 0.79 mg/dL (ref 0.76–1.27)
GFR calc Af Amer: 127 mL/min/{1.73_m2} (ref >59)
GFR, EST NON AFRICAN AMERICAN: 110 mL/min/{1.73_m2} (ref >59)
GLOBULIN, TOTAL: 2.2 g/dL (ref 1.5–4.5)
Glucose: 90 mg/dL (ref 65–99)
POTASSIUM: 4.4 mmol/L (ref 3.5–5.2)
SODIUM: 139 mmol/L (ref 134–144)
Total Protein: 6.9 g/dL (ref 6.0–8.5)

## 2016-10-29 LAB — TSH+FREE T4
Free T4: 1 ng/dL (ref 0.82–1.77)
TSH: 2.96 u[IU]/mL (ref 0.450–4.500)

## 2016-11-06 NOTE — Telephone Encounter (Signed)
Patient has been seen since phone call 

## 2016-11-13 ENCOUNTER — Other Ambulatory Visit: Payer: Self-pay | Admitting: Family Medicine

## 2016-11-27 ENCOUNTER — Encounter: Payer: Self-pay | Admitting: *Deleted

## 2016-12-03 DIAGNOSIS — K5904 Chronic idiopathic constipation: Secondary | ICD-10-CM | POA: Insufficient documentation

## 2016-12-03 DIAGNOSIS — K219 Gastro-esophageal reflux disease without esophagitis: Secondary | ICD-10-CM | POA: Insufficient documentation

## 2016-12-03 DIAGNOSIS — K279 Peptic ulcer, site unspecified, unspecified as acute or chronic, without hemorrhage or perforation: Secondary | ICD-10-CM | POA: Insufficient documentation

## 2016-12-11 ENCOUNTER — Other Ambulatory Visit: Payer: Self-pay | Admitting: Family Medicine

## 2016-12-12 DIAGNOSIS — S46212A Strain of muscle, fascia and tendon of other parts of biceps, left arm, initial encounter: Secondary | ICD-10-CM | POA: Insufficient documentation

## 2016-12-13 ENCOUNTER — Other Ambulatory Visit: Payer: Self-pay | Admitting: Family Medicine

## 2016-12-15 DIAGNOSIS — M25512 Pain in left shoulder: Secondary | ICD-10-CM | POA: Insufficient documentation

## 2016-12-19 ENCOUNTER — Other Ambulatory Visit: Payer: Self-pay | Admitting: Family Medicine

## 2016-12-25 ENCOUNTER — Ambulatory Visit: Payer: Medicare HMO | Admitting: Family Medicine

## 2016-12-26 ENCOUNTER — Other Ambulatory Visit: Payer: Self-pay | Admitting: Family Medicine

## 2016-12-28 ENCOUNTER — Other Ambulatory Visit: Payer: Self-pay | Admitting: Family Medicine

## 2016-12-30 ENCOUNTER — Other Ambulatory Visit: Payer: Self-pay | Admitting: Family Medicine

## 2016-12-30 ENCOUNTER — Encounter: Payer: Self-pay | Admitting: Family Medicine

## 2016-12-30 ENCOUNTER — Ambulatory Visit (INDEPENDENT_AMBULATORY_CARE_PROVIDER_SITE_OTHER): Payer: Medicare HMO | Admitting: Family Medicine

## 2016-12-30 VITALS — BP 113/59 | HR 56 | Temp 98.1°F | Ht 68.0 in | Wt 334.0 lb

## 2016-12-30 DIAGNOSIS — B370 Candidal stomatitis: Secondary | ICD-10-CM

## 2016-12-30 MED ORDER — GABAPENTIN 300 MG PO CAPS: 300.0000 mg | ORAL_CAPSULE | Freq: Three times a day (TID) | ORAL | 5 refills | Status: DC

## 2016-12-30 MED ORDER — CLOTRIMAZOLE 10 MG MT TROC: OROMUCOSAL | 2 refills | Status: DC

## 2016-12-30 NOTE — Progress Notes (Signed)
Subjective:  Patient ID: Roy [name], male    DOB: 1970/12/23  Age: 46 y.o. MRN: [id number]  CC: Thrush   HPI Roy [name] presents for describes pain as burning. Worse with certain foods such as orange juice. Present for 4 days.  History Roy [name] has a past medical history of Anxiety; Depression; Median nerve dysfunction; Radiculopathy of cervical region; Ulnar neuropathy at elbow; and Weakness.   He has a past surgical history that includes Rotator cuff repair (Right); Biceps tendon repair; and Neck surgery.   His family history includes Arrhythmia in his father; Hypertension in his mother; Stroke in his mother.He reports that he has been smoking Cigarettes.  He has been smoking about 0.50 packs per day. He has quit using smokeless tobacco. His smokeless tobacco use included Snuff and Chew. He reports that he does not drink alcohol or use drugs.  Current Outpatient Prescriptions on File Prior to Visit  Medication Sig Dispense Refill  . ALPRAZolam (XANAX) 0.5 MG tablet Take 1 tablet (0.5 mg total) by mouth 3 (three) times daily as needed for anxiety (& panic). 90 tablet 5  . aspirin EC 81 MG tablet Take 81 mg by mouth daily.    . budesonide-formoterol (SYMBICORT) 160-4.5 MCG/ACT inhaler Inhale 2 puffs into the lungs 2 (two) times daily. For COPD/Asthma 3 Inhaler 3  . carvedilol (COREG) 12.5 MG tablet TAKE ONE TABLET BY MOUTH TWICE DAILY WITH MEALS 180 tablet 0  . DULoxetine (CYMBALTA) 60 MG capsule TAKE TWO CAPSULES BY MOUTH ONCE DAILY 60 capsule 1  . fluticasone (FLONASE) 50 MCG/ACT nasal spray Place 2 sprays into both nostrils daily. 16 g 6  . gemfibrozil (LOPID) 600 MG tablet TAKE ONE TABLET BY MOUTH TWICE DAILY BEFORE A MEAL 60 tablet 0  . levothyroxine (SYNTHROID, LEVOTHROID) 50 MCG tablet TAKE ONE TABLET BY MOUTH ONCE DAILY 30 tablet 5  . Linaclotide (LINZESS) 290 MCG CAPS capsule Take 1 capsule (290 mcg total) by mouth daily. 30 capsule 2  . Omega-3 Fatty Acids (FISH OIL)  1000 MG CAPS Take 2 capsules by mouth daily.    . pantoprazole (PROTONIX) 40 MG tablet TAKE ONE TABLET BY MOUTH TWICE DAILY 90 tablet 0  . sucralfate (CARAFATE) 1 g tablet Take 1 tablet (1 g total) by mouth 4 (four) times daily -  with meals and at bedtime. To heal ulcer 120 tablet 1  . Testosterone (ANDROGEL PUMP) 20.25 MG/ACT (1.62%) GEL Apply 4 pumps daily to upper chest and shoulders 300 g 5  . topiramate (TOPAMAX) 50 MG tablet Take 1 tablet (50 mg total) by mouth 2 (two) times daily. 1 q hs X1 wk, then 2hs X 1 wk. Then three qhs X 1 wk, then 4 qhs 120 tablet 2  . traZODone (DESYREL) 100 MG tablet TAKE ONE TABLET BY MOUTH ONCE DAILY AT BEDTIME 30 tablet 2   No current facility-administered medications on file prior to visit.     ROS Review of Systems  Constitutional: Negative.     Objective:  BP (!) 113/59   Pulse (!) 56   Temp 98.1 F (36.7 C) (Oral)   Ht  (1.727 m)   Wt (!) 334 lb (151.5 kg)   BMI 50.78 kg/m   Physical Exam  Constitutional: He appears well-developed and well-nourished.  HENT:  Tongue cherry red. A few grey plaques on buccal mucosa.     Assessment & Plan:   Roy [name] was seen today for thrush.  Diagnoses and all orders for  this visit:  Thrush, oral  Other orders -     clotrimazole (MYCELEX) 10 MG troche; Allow one to dissolve in the mouth 5 times daily For yeast -     gabapentin (NEURONTIN) 300 MG capsule; Take 1 capsule (300 mg total) by mouth 3 (three) times daily.   I have changed Roy [name] gabapentin. I am also having him start on clotrimazole. Additionally, I am having him maintain his Fish Oil, Testosterone, linaclotide, gemfibrozil, fluticasone, ALPRAZolam, budesonide-formoterol, aspirin EC, sucralfate, topiramate, traZODone, levothyroxine, DULoxetine, pantoprazole, and carvedilol.  Meds ordered this encounter  Medications  . clotrimazole (MYCELEX) 10 MG troche    Sig: Allow one to dissolve in the mouth 5 times daily For yeast     Dispense:  35 tablet    Refill:  2  . gabapentin (NEURONTIN) 300 MG capsule    Sig: Take 1 capsule (300 mg total) by mouth 3 (three) times daily.    Dispense:  90 capsule    Refill:  5    Please consider 90 day supplies to promote better adherence     Follow-up: Return if symptoms worsen or fail to improve.  Mechele Claude, M.D.

## 2016-12-31 ENCOUNTER — Telehealth: Payer: Self-pay | Admitting: Family Medicine

## 2016-12-31 NOTE — Telephone Encounter (Signed)
What is the name of the medication? xanax  Have you contacted your pharmacy to request a refill? yes  Which pharmacy would you like this sent to? [facility]   Patient notified that their request is being sent to the clinical staff for review and that they should receive a call once it is complete. If they do not receive a call within 24 hours they can check with their pharmacy or our office.

## 2016-12-31 NOTE — Telephone Encounter (Signed)
Last filled 11/30/16, last seen 10/28/16. Call in

## 2017-01-01 NOTE — Telephone Encounter (Signed)
Refill called into [facility]

## 2017-01-02 NOTE — Telephone Encounter (Signed)
rx called into the pharmacy and pt is aware.

## 2017-01-10 ENCOUNTER — Other Ambulatory Visit: Payer: Self-pay | Admitting: Family Medicine

## 2017-01-20 ENCOUNTER — Ambulatory Visit (INDEPENDENT_AMBULATORY_CARE_PROVIDER_SITE_OTHER): Payer: Medicare HMO | Admitting: Family Medicine

## 2017-01-20 ENCOUNTER — Encounter: Payer: Self-pay | Admitting: Family Medicine

## 2017-01-20 VITALS — BP 113/66 | HR 61 | Temp 97.5°F | Ht 68.0 in | Wt 332.0 lb

## 2017-01-20 DIAGNOSIS — M5416 Radiculopathy, lumbar region: Secondary | ICD-10-CM | POA: Diagnosis not present

## 2017-01-20 DIAGNOSIS — I10 Essential (primary) hypertension: Secondary | ICD-10-CM | POA: Diagnosis not present

## 2017-01-20 DIAGNOSIS — E039 Hypothyroidism, unspecified: Secondary | ICD-10-CM

## 2017-01-20 DIAGNOSIS — B370 Candidal stomatitis: Secondary | ICD-10-CM | POA: Diagnosis not present

## 2017-01-20 DIAGNOSIS — E349 Endocrine disorder, unspecified: Secondary | ICD-10-CM

## 2017-01-20 MED ORDER — LEVOTHYROXINE SODIUM 50 MCG PO TABS: 50.0000 ug | ORAL_TABLET | Freq: Every day | ORAL | 5 refills | Status: DC

## 2017-01-20 MED ORDER — DULOXETINE HCL 60 MG PO CPEP: ORAL_CAPSULE | ORAL | 1 refills | Status: DC

## 2017-01-20 MED ORDER — TESTOSTERONE 20.25 MG/ACT (1.62%) TD GEL: TRANSDERMAL | 5 refills | Status: DC

## 2017-01-20 MED ORDER — CLOTRIMAZOLE 10 MG MT TROC
OROMUCOSAL | 2 refills | Status: DC
Start: 2017-01-20 — End: 2017-02-24

## 2017-01-20 NOTE — Progress Notes (Signed)
Subjective:  Patient ID: Roy [name], male    DOB: 1971-02-27  Age: 46 y.o. MRN: [id number]  CC: Hypothyroidism (pt here today for routine follow up on his chronic medical conditions and is also stating the "yeast" is still in his mouth.)   HPI Roy [name] presents for  follow-up of hypertension. Patient has no history of headache chest pain or shortness of breath or recent cough. Patient also denies symptoms of TIA such as numbness weakness lateralizing. Patient checks  blood pressure at home and has not had any elevated readings recently. Patient denies side effects from his medication. States taking it regularly. Follow-up hypogonadism: Patient has been off of his testosterone for some time. Would like to resume that. Since of well-being is okay. However he has continued to gain belly fat and lose muscle. Concerned that bones may have been strong as they need to be as well. Continues treatment for his lumbar radiculopathy with the Cymbalta. He understands losing weight would help with most of his problems. He is open to doing that.  History Roy [name] has a past medical history of Anxiety; Depression; Median nerve dysfunction; Radiculopathy of cervical region; Ulnar neuropathy at elbow; and Weakness.   He has a past surgical history that includes Rotator cuff repair (Right); Biceps tendon repair; and Neck surgery.   His family history includes Arrhythmia in his father; Hypertension in his mother; Stroke in his mother.He reports that he has been smoking Cigarettes.  He has been smoking about 0.50 packs per day. He has quit using smokeless tobacco. His smokeless tobacco use included Snuff and Chew. He reports that he does not drink alcohol or use drugs.    ROS Review of Systems  Constitutional: Negative for chills, diaphoresis, fever and unexpected weight change.  HENT: Negative for congestion, hearing loss, rhinorrhea and sore throat.   Eyes: Negative for visual disturbance.    Respiratory: Negative for cough and shortness of breath.   Cardiovascular: Negative for chest pain.  Gastrointestinal: Negative for abdominal pain, constipation and diarrhea.  Genitourinary: Negative for dysuria and flank pain.  Musculoskeletal: Negative for arthralgias and joint swelling.  Skin: Negative for rash.  Neurological: Negative for dizziness and headaches.  Psychiatric/Behavioral: Negative for dysphoric mood and sleep disturbance.    Objective:  BP 113/66   Pulse 61   Temp 97.5 F (36.4 C) (Oral)   Ht  (1.727 m)   Wt (!) 332 lb (150.6 kg)   BMI 50.48 kg/m   BP Readings from Last 3 Encounters:  01/20/17 113/66  12/30/16 (!) 113/59  10/28/16 123/67    Wt Readings from Last 3 Encounters:  01/20/17 (!) 332 lb (150.6 kg)  12/30/16 (!) 334 lb (151.5 kg)  10/28/16 (!) 349 lb (158.3 kg)     Physical Exam  Constitutional: He is oriented to person, place, and time. He appears well-developed and well-nourished. No distress.  HENT:  Head: Normocephalic and atraumatic.  Right Ear: External ear normal.  Left Ear: External ear normal.  Nose: Nose normal.  Mouth/Throat: Oropharynx is clear and moist.  Eyes: Conjunctivae and EOM are normal. Pupils are equal, round, and reactive to light.  Neck: Normal range of motion. Neck supple. No thyromegaly present.  Cardiovascular: Normal rate, regular rhythm and normal heart sounds.   No murmur heard. Pulmonary/Chest: Effort normal and breath sounds normal. No respiratory distress. He has no wheezes. He has no rales.  Abdominal: Soft. Bowel sounds are normal. He exhibits no distension. There is no  tenderness.  Lymphadenopathy:    He has no cervical adenopathy.  Neurological: He is alert and oriented to person, place, and time. He has normal reflexes.  Skin: Skin is warm and dry.  Psychiatric: He has a normal mood and affect. His behavior is normal. Judgment and thought content normal.      Assessment & Plan:   Roy [name]  was seen today for hypothyroidism.  Diagnoses and all orders for this visit:  Hypotestosteronism -     Cancel: Testosterone,Free and Total -     Testosterone (ANDROGEL PUMP) 20.25 MG/ACT (1.62%) GEL; Apply 4 pumps daily to upper chest and shoulders -     Testosterone,Free and Total; Standing  Morbid obesity (HCC)  Benign essential HTN  Thrush, oral  Hypothyroidism, unspecified type -     levothyroxine (SYNTHROID, LEVOTHROID) 50 MCG tablet; Take 1 tablet (50 mcg total) by mouth daily.  Lumbar radiculopathy -     DULoxetine (CYMBALTA) 60 MG capsule; TAKE TWO CAPSULES BY MOUTH ONCE DAILY  Other orders -     clotrimazole (MYCELEX) 10 MG troche; Allow one to dissolve in the mouth 5 times daily For yeast   Weight loss options discussed.    I have changed [name] levothyroxine. I am also having him maintain his Fish Oil, linaclotide, gemfibrozil, fluticasone, budesonide-formoterol, aspirin EC, sucralfate, topiramate, pantoprazole, carvedilol, ALPRAZolam, gabapentin, traZODone, Testosterone, DULoxetine, and clotrimazole.  Allergies as of 01/20/2017      Reactions   Penicillins Rash      Medication List       Accurate as of 01/20/17  3:37 PM. Always use your most recent med list.          ALPRAZolam 0.5 MG tablet Commonly known as:  XANAX TAKE ONE TABLET BY MOUTH THREE TIMES DAILY AS NEEDED FOR ANXIETY/PANIC   aspirin EC 81 MG tablet Take 81 mg by mouth daily.   budesonide-formoterol 160-4.5 MCG/ACT inhaler Commonly known as:  SYMBICORT Inhale 2 puffs into the lungs 2 (two) times daily. For COPD/Asthma   carvedilol 12.5 MG tablet Commonly known as:  COREG TAKE ONE TABLET BY MOUTH TWICE DAILY WITH MEALS   clotrimazole 10 MG troche Commonly known as:  MYCELEX Allow one to dissolve in the mouth 5 times daily For yeast   DULoxetine 60 MG capsule Commonly known as:  CYMBALTA TAKE TWO CAPSULES BY MOUTH ONCE DAILY   Fish Oil 1000 MG Caps Take 2 capsules by mouth  daily.   fluticasone 50 MCG/ACT nasal spray Commonly known as:  FLONASE Place 2 sprays into both nostrils daily.   gabapentin 300 MG capsule Commonly known as:  NEURONTIN Take 1 capsule (300 mg total) by mouth 3 (three) times daily.   gemfibrozil 600 MG tablet Commonly known as:  LOPID TAKE ONE TABLET BY MOUTH TWICE DAILY BEFORE A MEAL   levothyroxine 50 MCG tablet Commonly known as:  SYNTHROID, LEVOTHROID Take 1 tablet (50 mcg total) by mouth daily.   linaclotide 290 MCG Caps capsule Commonly known as:  LINZESS Take 1 capsule (290 mcg total) by mouth daily.   pantoprazole 40 MG tablet Commonly known as:  PROTONIX TAKE ONE TABLET BY MOUTH TWICE DAILY   sucralfate 1 g tablet Commonly known as:  CARAFATE Take 1 tablet (1 g total) by mouth 4 (four) times daily -  with meals and at bedtime. To heal ulcer   Testosterone 20.25 MG/ACT (1.62%) Gel Commonly known as:  ANDROGEL PUMP Apply 4 pumps daily to upper chest and shoulders  topiramate 50 MG tablet Commonly known as:  TOPAMAX Take 1 tablet (50 mg total) by mouth 2 (two) times daily. 1 q hs X1 wk, then 2hs X 1 wk. Then three qhs X 1 wk, then 4 qhs   traZODone 100 MG tablet Commonly known as:  DESYREL TAKE ONE TABLET BY MOUTH AT BEDTIME        Follow-up: No Follow-up on file.  Mechele Claude, M.D.

## 2017-01-21 ENCOUNTER — Other Ambulatory Visit: Payer: Medicare HMO

## 2017-01-21 DIAGNOSIS — E349 Endocrine disorder, unspecified: Secondary | ICD-10-CM | POA: Diagnosis not present

## 2017-01-22 LAB — TESTOSTERONE,FREE AND TOTAL
Testosterone, Free: 7.8 pg/mL (ref 6.8–21.5)
Testosterone: 202 ng/dL — ABNORMAL LOW (ref 264–916)

## 2017-01-24 ENCOUNTER — Ambulatory Visit: Payer: Medicare HMO | Admitting: Family Medicine

## 2017-01-28 ENCOUNTER — Other Ambulatory Visit: Payer: Medicare HMO

## 2017-01-28 ENCOUNTER — Other Ambulatory Visit: Payer: Self-pay | Admitting: Family Medicine

## 2017-01-28 DIAGNOSIS — E349 Endocrine disorder, unspecified: Secondary | ICD-10-CM

## 2017-01-29 LAB — TESTOSTERONE,FREE AND TOTAL
TESTOSTERONE FREE: 12 pg/mL (ref 6.8–21.5)
TESTOSTERONE: 248 ng/dL — AB (ref 264–916)

## 2017-01-30 ENCOUNTER — Other Ambulatory Visit: Payer: Self-pay | Admitting: *Deleted

## 2017-01-30 ENCOUNTER — Telehealth: Payer: Self-pay | Admitting: Family Medicine

## 2017-01-30 MED ORDER — GEMFIBROZIL 600 MG PO TABS: ORAL_TABLET | ORAL | 6 refills | Status: DC

## 2017-01-30 NOTE — Telephone Encounter (Signed)
done

## 2017-02-03 ENCOUNTER — Other Ambulatory Visit: Payer: Self-pay | Admitting: Family Medicine

## 2017-02-04 ENCOUNTER — Telehealth: Payer: Self-pay | Admitting: Family Medicine

## 2017-02-10 ENCOUNTER — Telehealth: Payer: Self-pay | Admitting: *Deleted

## 2017-02-10 ENCOUNTER — Encounter: Payer: Self-pay | Admitting: Family Medicine

## 2017-02-10 ENCOUNTER — Ambulatory Visit (INDEPENDENT_AMBULATORY_CARE_PROVIDER_SITE_OTHER): Payer: Medicare HMO | Admitting: Family Medicine

## 2017-02-10 VITALS — BP 103/63 | HR 58 | Temp 97.1°F | Ht 68.0 in | Wt 334.0 lb

## 2017-02-10 DIAGNOSIS — G43411 Hemiplegic migraine, intractable, with status migrainosus: Secondary | ICD-10-CM | POA: Diagnosis not present

## 2017-02-10 MED ORDER — METAXALONE 800 MG PO TABS: 800.0000 mg | ORAL_TABLET | Freq: Three times a day (TID) | ORAL | 5 refills | Status: DC

## 2017-02-10 MED ORDER — KETOROLAC TROMETHAMINE 60 MG/2ML IM SOLN
60.0000 mg | Freq: Once | INTRAMUSCULAR | Status: AC
  Administered 2017-02-10: 60 mg via INTRAMUSCULAR

## 2017-02-10 MED ORDER — BUTALBITAL-APAP-CAFF-COD 50-325-40-30 MG PO CAPS: 1.0000 | ORAL_CAPSULE | ORAL | 0 refills | Status: DC | PRN

## 2017-02-10 NOTE — Telephone Encounter (Signed)
Insurance won't cover Metaxalone Please change to something else

## 2017-02-10 NOTE — Progress Notes (Signed)
Subjective:  Patient ID: Roy [name], male    DOB: 04/07/1971  Age:Roy [name] 46 y.o. MRN: [id number]  CC: Headache (pt here today c/o headache since last Wednesday)   HPI Roy GarfinkelStephen M [name] presents for 5 days of headache. He says it started at the base of the neck to the right posterior aspect of the base. It radiated up behind the ear and into the right temple. Subsequently also affected the left temple. It's been waxing and waning mental with some of the worst pain from migraine that he's ever had. It has been a slight as 4/10 but for quite a bit of the last few days he's had's 10/10 pain. He's been using Tylenol with no relief. He describes the sensation as pressure. He denies any focal neurologic changes including vision changes numbness tingling weakness of any extremity or face. No changes in sense of hearing.  History Roy SeniorStephen has a past medical history of Anxiety; Depression; Median nerve dysfunction; Radiculopathy of cervical region; Ulnar neuropathy at elbow; and Weakness.   He has a past surgical history that includes Rotator cuff repair (Right); Biceps tendon repair; and Neck surgery.   His family history includes Arrhythmia in his father; Hypertension in his mother; Stroke in his mother.He reports that he has been smoking Cigarettes.  He has been smoking about 0.50 packs per day. He has quit using smokeless tobacco. His smokeless tobacco use included Snuff and Chew. He reports that he does not drink alcohol or use drugs.  Current Outpatient Prescriptions on File Prior to Visit  Medication Sig Dispense Refill  . ALPRAZolam (XANAX) 0.5 MG tablet TAKE ONE TABLET BY MOUTH THREE TIMES DAILY AS NEEDED FOR ANXIETY/PANIC 90 tablet 5  . aspirin EC 81 MG tablet Take 81 mg by mouth daily.    . budesonide-formoterol (SYMBICORT) 160-4.5 MCG/ACT inhaler Inhale 2 puffs into the lungs 2 (two) times daily. For COPD/Asthma 3 Inhaler 3  . carvedilol (COREG) 12.5 MG tablet TAKE ONE TABLET BY MOUTH TWICE DAILY  WITH MEALS 180 tablet 0  . clotrimazole (MYCELEX) 10 MG troche Allow one to dissolve in the mouth 5 times daily For yeast 35 tablet 2  . DULoxetine (CYMBALTA) 60 MG capsule TAKE TWO CAPSULES BY MOUTH ONCE DAILY 60 capsule 1  . fluticasone (FLONASE) 50 MCG/ACT nasal spray Place 2 sprays into both nostrils daily. 16 g 6  . gabapentin (NEURONTIN) 300 MG capsule Take 1 capsule (300 mg total) by mouth 3 (three) times daily. 90 capsule 5  . gemfibrozil (LOPID) 600 MG tablet TAKE ONE TABLET BY MOUTH TWICE DAILY BEFORE A MEAL 60 tablet 6  . levothyroxine (SYNTHROID, LEVOTHROID) 50 MCG tablet Take 1 tablet (50 mcg total) by mouth daily. 90 tablet 5  . Linaclotide (LINZESS) 290 MCG CAPS capsule Take 1 capsule (290 mcg total) by mouth daily. 30 capsule 2  . Omega-3 Fatty Acids (FISH OIL) 1000 MG CAPS Take 2 capsules by mouth daily.    . pantoprazole (PROTONIX) 40 MG tablet TAKE 1 TABLET BY MOUTH TWICE DAILY 90 tablet 0  . sucralfate (CARAFATE) 1 g tablet Take 1 tablet (1 g total) by mouth 4 (four) times daily -  with meals and at bedtime. To heal ulcer 120 tablet 1  . Testosterone (ANDROGEL PUMP) 20.25 MG/ACT (1.62%) GEL Apply 4 pumps daily to upper chest and shoulders 300 g 5  . topiramate (TOPAMAX) 50 MG tablet Take 1 tablet (50 mg total) by mouth 2 (two) times daily. 1 q hs X1  wk, then 2hs X 1 wk. Then three qhs X 1 wk, then 4 qhs 120 tablet 2  . traZODone (DESYREL) 100 MG tablet TAKE ONE TABLET BY MOUTH AT BEDTIME 30 tablet 5   No current facility-administered medications on file prior to visit.     ROS Review of Systems  Constitutional: Negative for chills, diaphoresis and fever.  HENT: Negative for rhinorrhea and sore throat.   Respiratory: Negative for cough and shortness of breath.   Cardiovascular: Negative for chest pain.  Gastrointestinal: Negative for abdominal pain.  Musculoskeletal: Negative for arthralgias and myalgias.  Skin: Negative for rash.  Neurological: Negative for dizziness,  seizures, weakness, light-headedness and numbness.    Objective:  BP 103/63   Pulse (!) 58   Temp 97.1 F (36.2 C) (Oral)   Ht 5\' 8"  (1.727 m)   Wt (!) 334 lb (151.5 kg)   BMI 50.78 kg/m   Physical Exam  Constitutional: He appears well-developed and well-nourished.  HENT:  Head: Normocephalic and atraumatic.  Right Ear: Tympanic membrane and external ear normal. No decreased hearing is noted.  Left Ear: Tympanic membrane and external ear normal. No decreased hearing is noted.  Mouth/Throat: No oropharyngeal exudate or posterior oropharyngeal erythema.  Eyes: Pupils are equal, round, and reactive to light.  Neck: Normal range of motion. Neck supple.  Cardiovascular: Normal rate and regular rhythm.   No murmur heard. Pulmonary/Chest: Breath sounds normal. No respiratory distress.  Abdominal: Soft. Bowel sounds are normal. He exhibits no mass. There is no tenderness.  Musculoskeletal: Normal range of motion. He exhibits tenderness (at the base of the neck, right. Palpable spasm in the superior border of the trapezius near the insertion at the base of the scalp. Tenderness radiates across the  temporalis to both temples). He exhibits no edema.  Vitals reviewed.   Assessment & Plan:   Ervie was seen today for headache.  Diagnoses and all orders for this visit:  Intractable hemiplegic migraine with status migrainosus -     ketorolac (TORADOL) injection 60 mg; Inject 2 mLs (60 mg total) into the muscle once.  Other orders -     butalbital-acetaminophen-caffeine (FIORICET/CODEINE) 50-325-40-30 MG capsule; Take 1 capsule by mouth every 4 (four) hours as needed for headache. -     metaxalone (SKELAXIN) 800 MG tablet; Take 1 tablet (800 mg total) by mouth 3 (three) times daily. For headache associated muscle spasm in the neck and scalp   I am having Mr. Brothers start on butalbital-acetaminophen-caffeine and metaxalone. I am also having him maintain his Fish Oil, linaclotide,  fluticasone, budesonide-formoterol, aspirin EC, sucralfate, topiramate, carvedilol, ALPRAZolam, gabapentin, traZODone, Testosterone, DULoxetine, levothyroxine, clotrimazole, gemfibrozil, and pantoprazole. We will continue to administer ketorolac.  Meds ordered this encounter  Medications  . butalbital-acetaminophen-caffeine (FIORICET/CODEINE) 50-325-40-30 MG capsule    Sig: Take 1 capsule by mouth every 4 (four) hours as needed for headache.    Dispense:  30 capsule    Refill:  0  . metaxalone (SKELAXIN) 800 MG tablet    Sig: Take 1 tablet (800 mg total) by mouth 3 (three) times daily. For headache associated muscle spasm in the neck and scalp    Dispense:  30 tablet    Refill:  5  . ketorolac (TORADOL) injection 60 mg     Follow-up: Return if symptoms worsen or fail to improve.  Mechele Claude, M.D.

## 2017-02-11 ENCOUNTER — Telehealth: Payer: Self-pay

## 2017-02-11 ENCOUNTER — Other Ambulatory Visit: Payer: Self-pay | Admitting: Family Medicine

## 2017-02-11 MED ORDER — TIZANIDINE HCL 4 MG PO TABS: 4.0000 mg | ORAL_TABLET | Freq: Three times a day (TID) | ORAL | 2 refills | Status: DC

## 2017-02-11 NOTE — Telephone Encounter (Signed)
I sent in the requested prescription 

## 2017-02-11 NOTE — Telephone Encounter (Signed)
Patient aware of medication change due to insurance coverage 

## 2017-02-21 ENCOUNTER — Emergency Department (HOSPITAL_COMMUNITY)
Admission: EM | Admit: 2017-02-21 | Discharge: 2017-02-21 | Disposition: A | Discharge status: Home / Self Care | Payer: Medicare HMO | Attending: Physician Assistant | Admitting: Physician Assistant

## 2017-02-21 ENCOUNTER — Ambulatory Visit: Payer: Medicare HMO | Admitting: Family Medicine

## 2017-02-21 ENCOUNTER — Encounter (HOSPITAL_COMMUNITY): Payer: Self-pay | Admitting: Emergency Medicine

## 2017-02-21 ENCOUNTER — Emergency Department (HOSPITAL_COMMUNITY): Payer: Medicare HMO

## 2017-02-21 DIAGNOSIS — F17210 Nicotine dependence, cigarettes, uncomplicated: Secondary | ICD-10-CM | POA: Insufficient documentation

## 2017-02-21 DIAGNOSIS — R51 Headache: Secondary | ICD-10-CM | POA: Insufficient documentation

## 2017-02-21 DIAGNOSIS — R519 Headache, unspecified: Secondary | ICD-10-CM

## 2017-02-21 DIAGNOSIS — I10 Essential (primary) hypertension: Secondary | ICD-10-CM | POA: Insufficient documentation

## 2017-02-21 DIAGNOSIS — M4802 Spinal stenosis, cervical region: Secondary | ICD-10-CM | POA: Diagnosis not present

## 2017-02-21 LAB — BASIC METABOLIC PANEL
Anion gap: 10 (ref 5–15)
BUN: 9 mg/dL (ref 6–20)
CALCIUM: 9.4 mg/dL (ref 8.9–10.3)
CHLORIDE: 102 mmol/L (ref 101–111)
CO2: 23 mmol/L (ref 22–32)
CREATININE: 0.88 mg/dL (ref 0.61–1.24)
GFR calc non Af Amer: >60 mL/min (ref >60)
Glucose, Bld: 110 mg/dL — ABNORMAL HIGH (ref 65–99)
Potassium: 3.9 mmol/L (ref 3.5–5.1)
Sodium: 135 mmol/L (ref 135–145)

## 2017-02-21 LAB — CBC WITH DIFFERENTIAL/PLATELET
Basophils Absolute: 0 10*3/uL (ref 0.0–0.1)
Basophils Relative: 0 %
EOS PCT: 3 %
Eosinophils Absolute: 0.2 10*3/uL (ref 0.0–0.7)
HCT: 41.1 % (ref 39.0–52.0)
HEMOGLOBIN: 14 g/dL (ref 13.0–17.0)
Lymphocytes Relative: 32 %
Lymphs Abs: 1.8 10*3/uL (ref 0.7–4.0)
MCH: 31.8 pg (ref 26.0–34.0)
MCHC: 34.1 g/dL (ref 30.0–36.0)
MCV: 93.4 fL (ref 78.0–100.0)
MONO ABS: 0.2 10*3/uL (ref 0.1–1.0)
MONOS PCT: 4 %
NEUTROS PCT: 61 %
Neutro Abs: 3.4 10*3/uL (ref 1.7–7.7)
PLATELETS: 214 10*3/uL (ref 150–400)
RBC: 4.4 MIL/uL (ref 4.22–5.81)
RDW: 13.3 % (ref 11.5–15.5)
WBC: 5.7 10*3/uL (ref 4.0–10.5)

## 2017-02-21 MED ORDER — KETOROLAC TROMETHAMINE 30 MG/ML IJ SOLN
30.0000 mg | Freq: Once | INTRAMUSCULAR | Status: AC
  Administered 2017-02-21: 30 mg via INTRAVENOUS
  Filled 2017-02-21: qty 1

## 2017-02-21 MED ORDER — SODIUM CHLORIDE 0.9 % IV BOLUS (SEPSIS)
1000.0000 mL | Freq: Once | INTRAVENOUS | Status: AC
  Administered 2017-02-21: 1000 mL via INTRAVENOUS

## 2017-02-21 MED ORDER — METHYLPREDNISOLONE SODIUM SUCC 125 MG IJ SOLR
80.0000 mg | Freq: Once | INTRAMUSCULAR | Status: AC
  Administered 2017-02-21: 80 mg via INTRAVENOUS
  Filled 2017-02-21: qty 2

## 2017-02-21 MED ORDER — METOCLOPRAMIDE HCL 5 MG/ML IJ SOLN
10.0000 mg | Freq: Once | INTRAMUSCULAR | Status: AC
  Administered 2017-02-21: 10 mg via INTRAVENOUS
  Filled 2017-02-21: qty 2

## 2017-02-21 NOTE — ED Triage Notes (Signed)
Pt reports headache that starts in right neck and travels around to both temples, as well as ringing in ears and scratchy throat since 5/21, was seen at PCP and given pain meds and injection but states that headache has returned. No neuro symptoms present. Pt a/ox4, resp e/u.

## 2017-02-21 NOTE — Discharge Instructions (Signed)
You have been seen today for a headache. There were no acute abnormalities noted on CT scan. Please be sure to stay well hydrated. Continue to use the medications prescribed by your primary care provider. Follow up with your primary care provider as soon as possible. Any further necessary testing will need to be coordinated through your PCP.

## 2017-02-21 NOTE — ED Notes (Signed)
PT to CT.

## 2017-02-21 NOTE — ED Notes (Signed)
ED Provider at bedside. 

## 2017-02-21 NOTE — ED Provider Notes (Signed)
[facility]-EMERGENCY DEPT Provider Note   CSN: [id number] Arrival date & time: 02/21/17  0957     History   Chief Complaint Chief Complaint  Patient presents with  . Headache    HPI BRITTIAN RENALDO is a 46 y.o. male.  HPI    STOKES RATTIGAN is a 46 y.o. male, with a history of Anxiety, radiculopathy of cervical spine, chronic left-sided weakness, brain cyst, presenting to the ED with headache beginning 5/16.  Pain begins in the back of the head and neck on the right and seems to wrap around the front of his head. States his normal headaches are bilateral frontal and resolve with BC powder.  Saw his PCP, Mechele Claude, for this issue on 5/21, given a toradol injection and prescribed tizanidine and fioricet.  These medications give relief, but then pain returns. States he did not have any adverse effects from the toradol. Had an appointment with his PCP today, but cancelled it.  Denies fever/chills, nausea/vomiting, falls/trauma, vision changes, dizziness, neck stiffness, acute neuro deficits, or any other complaints.   Past Medical History:  Diagnosis Date  . Anxiety   . Depression   . Median nerve dysfunction   . Radiculopathy of cervical region   . Ulnar neuropathy at elbow   . Weakness     Patient Active Problem List   Diagnosis Date Noted  . Headache 01/22/2016  . Nicotine abuse 12/21/2015  . Benign essential HTN 12/21/2015  . Hypothyroid 12/21/2015  . Morbid obesity (HCC) 10/19/2015  . 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 Lumbar radiculopathy 08/11/2013  . 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 Hypotestosteronism 03/29/2013    Past Surgical History:  Procedure Laterality Date  . BICEPS TENDON REPAIR    . NECK SURGERY     Fusion done two seperate times  . ROTATOR CUFF REPAIR Right        Home Medications    Prior to Admission medications   Medication Sig Start Date End Date Taking? Authorizing Provider  acetaminophen (TYLENOL) 500 MG tablet Take 1,000 mg by  mouth every 6 (six) hours as needed.   Yes [provider]  ALPRAZolam Prudy Feeler) 0.5 MG tablet TAKE ONE TABLET BY MOUTH THREE TIMES DAILY AS NEEDED FOR ANXIETY/PANIC 12/31/16  Yes Mechele Claude, MD  aspirin EC 81 MG tablet Take 81 mg by mouth daily.   Yes [provider]  budesonide-formoterol (SYMBICORT) 160-4.5 MCG/ACT inhaler Inhale 2 puffs into the lungs 2 (two) times daily. For COPD/Asthma 08/26/16  Yes Stacks, Broadus John, MD  butalbital-acetaminophen-caffeine (FIORICET/CODEINE) [phone] MG capsule Take 1 capsule by mouth every 4 (four) hours as needed for headache. 02/10/17  Yes Stacks, Broadus John, MD  carvedilol (COREG) 12.5 MG tablet TAKE ONE TABLET BY MOUTH TWICE DAILY WITH MEALS Patient taking differently: TAKE ONE 12.5 mg tablet TWICE DAILY WITH MEALS 12/19/16  Yes Stacks, Broadus John, MD  DULoxetine (CYMBALTA) 60 MG capsule TAKE TWO CAPSULES BY MOUTH ONCE DAILY Patient taking differently: Take 120 mg by mouth every evening. TAKE TWO CAPSULES BY MOUTH ONCE DAILY 01/20/17  Yes Mechele Claude, MD  gabapentin (NEURONTIN) 300 MG capsule Take 1 capsule (300 mg total) by mouth 3 (three) times daily. 12/30/16  Yes Stacks, Broadus John, MD  gemfibrozil (LOPID) 600 MG tablet TAKE ONE TABLET BY MOUTH TWICE DAILY BEFORE A MEAL Patient taking differently: Take 600 mg by mouth 2 (two) times daily. TAKE ONE TABLET BY MOUTH TWICE DAILY BEFORE A MEAL 01/30/17  Yes Mechele Claude, MD  levothyroxine (SYNTHROID, LEVOTHROID)  50 MCG tablet Take 1 tablet (50 mcg total) by mouth daily. 01/20/17  Yes Stacks, Broadus JohnWarren, MD  Linaclotide (LINZESS) 290 MCG CAPS capsule Take 1 capsule (290 mcg total) by mouth daily. Patient taking differently: Take 290 mcg by mouth as needed.  12/22/15  Yes Stacks, Broadus JohnWarren, MD  Omega-3 Fatty Acids (FISH OIL) 1000 MG CAPS Take 2,000 capsules by mouth daily.    Yes [provider]  pantoprazole (PROTONIX) 40 MG tablet TAKE 1 TABLET BY MOUTH TWICE DAILY 02/03/17  Yes Mechele ClaudeStacks, Warren, MD    Testosterone (ANDROGEL PUMP) 20.25 MG/ACT (1.62%) GEL Apply 4 pumps daily to upper chest and shoulders 01/20/17  Yes Stacks, Broadus JohnWarren, MD  tiZANidine (ZANAFLEX) 4 MG tablet Take 1 tablet (4 mg total) by mouth 3 (three) times daily. As needed for headache 02/11/17  Yes Stacks, Broadus JohnWarren, MD  topiramate (TOPAMAX) 50 MG tablet Take 1 tablet (50 mg total) by mouth 2 (two) times daily. 1 q hs X1 wk, then 2hs X 1 wk. Then three qhs X 1 wk, then 4 qhs 10/07/16  Yes Stacks, Broadus JohnWarren, MD  traZODone (DESYREL) 100 MG tablet TAKE ONE TABLET BY MOUTH AT BEDTIME 01/10/17  Yes Stacks, Broadus JohnWarren, MD  clotrimazole [facility](MYCELEX) 10 MG troche Allow one to dissolve in the mouth 5 times daily For yeast Patient not taking: Reported on 02/21/2017 01/20/17   Mechele ClaudeStacks, Warren, MD  fluticasone (FLONASE) 50 MCG/ACT nasal spray Place 2 sprays into both nostrils daily. Patient not taking: Reported on 02/21/2017 06/24/16   Elenora GammaBradshaw, Samuel L, MD  sucralfate (CARAFATE) 1 g tablet Take 1 tablet (1 g total) by mouth 4 (four) times daily -  with meals and at bedtime. To heal ulcer Patient not taking: Reported on 02/21/2017 10/07/16   Mechele ClaudeStacks, Warren, MD    Family History Family History  Problem Relation Age of Onset  . Hypertension Mother   . Stroke Mother   . Arrhythmia Father     Social History Social History  Substance Use Topics  . Smoking status: Current Every Day Smoker    Packs/day: 0.50    Types: Cigarettes  . Smokeless tobacco: Former NeurosurgeonUser    Types: Snuff, Chew  . Alcohol use No     Allergies   Penicillins   Review of Systems Review of Systems  Constitutional: Negative for chills, diaphoresis and fever.  Respiratory: Negative for shortness of breath.   Cardiovascular: Negative for chest pain.  Gastrointestinal: Negative for abdominal pain, nausea and vomiting.  Musculoskeletal: Positive for neck pain. Negative for back pain and neck stiffness.  Skin: Negative for rash.  Neurological: Positive for headaches. Negative for  dizziness, facial asymmetry, speech difficulty, weakness, light-headedness and numbness.  All other systems reviewed and are negative.    Physical Exam Updated Vital Signs BP (!) 150/92 (BP Location: Right Arm)   Pulse 62   Temp 97.8 F (36.6 C) (Oral)   Resp 18   Ht 5\' 8"  (1.727 m)   Wt (!) 149.7 kg (330 lb)   SpO2 100%   BMI 50.18 kg/m   Physical Exam  Constitutional: He appears well-developed and well-nourished. No distress.  HENT:  Head: Normocephalic and atraumatic.  Mouth/Throat: Oropharynx is clear and moist.  Eyes: Conjunctivae and EOM are normal. Pupils are equal, round, and reactive to light.  Neck: Normal range of motion. Neck supple. No Brudzinski's sign and no Kernig's sign noted.  Cardiovascular: Normal rate, regular rhythm, normal heart sounds and intact distal pulses.   Pulmonary/Chest: Effort normal and  breath sounds normal. No respiratory distress.  Abdominal: Soft. There is no tenderness. There is no guarding.  Musculoskeletal: He exhibits tenderness. He exhibits no edema.  Tenderness to the right cervical musculature and right occipital region. Normal motor function intact in all extremities and spine. No midline spinal tenderness.   Lymphadenopathy:    He has no cervical adenopathy.  Neurological: He is alert.  No sensory deficits. Strength 5/5 in all extremities, weaker on the left at baseline. No gait disturbance. Coordination intact including heel to shin and finger to nose. Cranial nerves III-XII grossly intact. No facial droop.   Skin: Skin is warm and dry. He is not diaphoretic.  Psychiatric: He has a normal mood and affect. His behavior is normal.  Nursing note and vitals reviewed.    ED Treatments / Results  Labs (all labs ordered are listed, but only abnormal results are displayed) Labs Reviewed  BASIC METABOLIC PANEL - Abnormal; Notable for the following:       Result Value   Glucose, Bld 110 (*)    All other components within normal  limits  CBC WITH DIFFERENTIAL/PLATELET    EKG  EKG Interpretation None       Radiology Ct Head Wo Contrast  Result Date: 02/21/2017 CLINICAL DATA:  Headache beginning 11 days ago. Pain in the right-sided neck extending to both temples. Ringing in both ears. EXAM: CT HEAD WITHOUT CONTRAST CT CERVICAL SPINE WITHOUT CONTRAST TECHNIQUE: Multidetector CT imaging of the head and cervical spine was performed following the standard protocol without intravenous contrast. Multiplanar CT image reconstructions of the cervical spine were also generated. COMPARISON:  None. 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 No acute cortical infarct, hemorrhage, or mass lesion is present. Cystic changes within the cord plexus bilaterally are stable. No significant extra-axial fluid collection is present. 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 Midline structures are unremarkable otherwise. Vascular: No hyperdense vessel or unexpected calcification. Skull: The calvarium is intact. No focal lytic or blastic lesions are present. Sinuses/Orbits: Minimal mucosal thickening is noted in the anterolateral right maxillary sinus. The remaining paranasal sinuses and mastoid air cells are clear. The globes and orbits are within normal limits. CT CERVICAL SPINE FINDINGS Alignment: Anterior cervical fusion is solid at C3-4 and C4-5. Cerclage wires are noted posteriorly at the same levels. Facet joints are at all 3 levels on the right and on the left at C3-4. No acute fracture traumatic subluxation present. Slight rightward curvature is present in the cervical spine. Skull base and vertebrae: The craniocervical junction is normal. Vertebral body heights are preserved. No acute fractures present. Soft tissues and spinal canal: Soft tissues of the neck are unremarkable. The central canal is  grossly patent. Disc levels: Uncovertebral and foraminal disease is worse on the left C3-4 and C4-5. Right greater than left foraminal narrowing is present at C6-7. Upper chest: The lung apices are clear. IMPRESSION: 1. No acute intracranial abnormality or significant interval change. 2. 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 3. Anterior cervical fusion at C3-4 and C4-5 with residual left foraminal stenosis at both levels. 4. Right greater left foraminal stenosis at C6-7. 5. No acute fracture or traumatic subluxation within the cervical spine. Electronically Signed   By: Marin Roberts M.D.   On: 02/21/2017 11:30   Ct Cervical Spine Wo Contrast  Result Date: 02/21/2017 CLINICAL DATA:  Headache beginning 11 days ago. Pain in the right-sided neck extending to both temples. Ringing in both ears. EXAM: CT HEAD WITHOUT CONTRAST CT CERVICAL SPINE WITHOUT CONTRAST TECHNIQUE: Multidetector CT imaging of the head and cervical spine was performed following the standard protocol without intravenous contrast. Multiplanar CT image reconstructions of the cervical spine were also generated. COMPARISON:  None. 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 No acute cortical infarct, hemorrhage, or mass lesion is present. Cystic changes within the cord plexus bilaterally are stable. No significant extra-axial fluid collection is present. 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 Midline structures are unremarkable otherwise. Vascular: No hyperdense vessel or unexpected calcification. Skull: The calvarium is intact. No focal lytic or blastic lesions are present. Sinuses/Orbits: Minimal mucosal thickening is noted in the anterolateral right maxillary sinus. The remaining paranasal sinuses and mastoid air cells are clear. The globes and orbits are within normal limits. CT  CERVICAL SPINE FINDINGS Alignment: Anterior cervical fusion is solid at C3-4 and C4-5. Cerclage wires are noted posteriorly at the same levels. Facet joints are at all 3 levels on the right and on the left at C3-4. No acute fracture traumatic subluxation present. Slight rightward curvature is present in the cervical spine. Skull base and vertebrae: The craniocervical junction is normal. Vertebral body heights are preserved. No acute fractures present. Soft tissues and spinal canal: Soft tissues of the neck are unremarkable. The central canal is grossly patent. Disc levels: Uncovertebral and foraminal disease is worse on the left C3-4 and C4-5. Right greater than left foraminal narrowing is present at C6-7. Upper chest: The lung apices are clear. IMPRESSION: 1. No acute intracranial abnormality or significant interval change. 2. 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 3. Anterior cervical fusion at C3-4 and C4-5 with residual left foraminal stenosis at both levels. 4. Right greater left foraminal stenosis at C6-7. 5. No acute fracture or traumatic subluxation within the cervical spine. Electronically Signed   By: Marin Roberts M.D.   On: 02/21/2017 11:30    Procedures Procedures (including critical care time)  Medications Ordered in ED Medications  metoCLOPramide (REGLAN) injection 10 mg (10 mg Intravenous Given 02/21/17 1045)  ketorolac (TORADOL) 30 MG/ML injection 30 mg (30 mg Intravenous Given 02/21/17 1045)  sodium chloride 0.9 % bolus 1,000 mL (0 mLs Intravenous Stopped 02/21/17 1218)  methylPREDNISolone sodium succinate (SOLU-MEDROL) 125 mg/2 mL injection 80 mg (80 mg Intravenous Given 02/21/17 1045)     Initial Impression / Assessment and Plan / ED Course  I have reviewed the triage vital signs and the nursing notes.  Pertinent labs & imaging results that were available during my care of the patient were reviewed by me and considered in my medical decision making (see chart for  details).  Clinical Course as of Feb 21 1226  Fri Feb 21, 2017  1145 Patient voices significant improvement in his headache. Denies additional symptoms.  [SJ]    Clinical Course User Index [SJ] Tajae Rybicki C, PA-C    Patient presents with headache consistent with previous headaches. No neuro or functional deficits. Patient is nontoxic appearing, afebrile, not tachycardic, not tachypneic, not hypotensive, and is in no apparent distress. Patient presentation is not suspicious for meningitis. No acute abnormalities on CT. PCP follow-up. The patient was given instructions for home care as well as return precautions. Patient voices understanding of these  instructions, accepts the plan, and is comfortable with discharge.     Final Clinical Impressions(s) / ED Diagnoses   Final diagnoses:  Bad headache    New Prescriptions New Prescriptions   No medications on file     Concepcion Living 02/21/17 1227    Abelino Derrick, MD 02/25/17 1557

## 2017-02-24 ENCOUNTER — Ambulatory Visit (INDEPENDENT_AMBULATORY_CARE_PROVIDER_SITE_OTHER): Payer: Medicare HMO | Admitting: Family Medicine

## 2017-02-24 ENCOUNTER — Encounter: Payer: Self-pay | Admitting: Family Medicine

## 2017-02-24 VITALS — BP 123/74 | HR 65 | Temp 97.4°F | Ht 68.0 in | Wt 331.0 lb

## 2017-02-24 DIAGNOSIS — G43411 Hemiplegic migraine, intractable, with status migrainosus: Secondary | ICD-10-CM

## 2017-02-24 DIAGNOSIS — J301 Allergic rhinitis due to pollen: Secondary | ICD-10-CM | POA: Diagnosis not present

## 2017-02-24 MED ORDER — SUMATRIPTAN SUCCINATE 100 MG PO TABS: ORAL_TABLET | ORAL | 11 refills | Status: DC

## 2017-02-24 MED ORDER — CETIRIZINE HCL 10 MG PO TABS: 10.0000 mg | ORAL_TABLET | Freq: Every day | ORAL | 3 refills | Status: DC

## 2017-02-24 MED ORDER — TIZANIDINE HCL 4 MG PO TABS: 4.0000 mg | ORAL_TABLET | Freq: Three times a day (TID) | ORAL | 5 refills | Status: DC

## 2017-02-24 NOTE — Progress Notes (Signed)
Chief Complaint  Patient presents with  . Headache    pt here today c/o headache that comes and goes    HPI  Patient presents today for Headache starts in the back of his neck. It is relieved fairly well by tizanidine. He is taking that when necessary for now. He has been under a lot of stress with his wife's health. She in fact is having abdominal surgery for partial colectomy tomorrow. He is concerned about how dangerous that surgery is for her. The headache is a dull ache that can be as severe as 8/10. At this time he's also having problems with upper respiratory congestion that may be triggering it. For the last week he's had some congestion in the nasal passages postnasal drainage and feels like there is a lump in his throat. He has an intermittent scratchy throat as well. No cough and no productivity. Additionally he states that the headache does not affect his vision he is not photophobic but it does have a throbbing component in addition to a dull ache. He has a prior history of cervical fusion surgery 2  PMH: Smoking status noted ROS: Review of Systems  Constitutional: Negative for chills, diaphoresis and fever.  HENT: Positive for congestion and sinus pain. Negative for hearing loss, sore throat and tinnitus.   Eyes: Negative for photophobia.  Respiratory: Negative for hemoptysis, sputum production, shortness of breath, wheezing and stridor.   Cardiovascular: Negative for chest pain, palpitations and orthopnea.  Gastrointestinal: Negative for heartburn, nausea and vomiting.  Skin: Negative for rash.  Neurological: Positive for headaches. Negative for dizziness, sensory change and weakness.  Psychiatric/Behavioral: Negative for memory loss. The patient is nervous/anxious.      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 Seasonal allergic rhinitis due to pollen     Meds ordered this encounter  Medications  . cetirizine (ZYRTEC) 10 MG tablet    Sig: Take 1 tablet (10 mg total) by mouth daily. For allergy symptoms    Dispense:  90 tablet    Refill:  3  . SUMAtriptan (IMITREX) 100 MG tablet    Sig: Take one at onset of HA. May repeat in 2 hours if headache persists or recurs. Limit two per 24 hours    Dispense:  10 tablet    Refill:  11  . tiZANidine (ZANAFLEX) 4 MG tablet    Sig: Take 1 tablet (4 mg total) by mouth 3 (three) times daily. To prevent HA    Dispense:  90 tablet    Refill:  5    Replaces previous prn scrip    No orders of the defined types were placed in this encounter.   Follow up 1 month Mechele ClaudeWarren Aurelius Gildersleeve, MD

## 2017-03-19 ENCOUNTER — Other Ambulatory Visit: Payer: Self-pay | Admitting: Family Medicine

## 2017-04-14 ENCOUNTER — Other Ambulatory Visit: Payer: Self-pay | Admitting: Family Medicine

## 2017-04-14 DIAGNOSIS — M5416 Radiculopathy, lumbar region: Secondary | ICD-10-CM

## 2017-04-21 ENCOUNTER — Ambulatory Visit (INDEPENDENT_AMBULATORY_CARE_PROVIDER_SITE_OTHER): Payer: Medicare HMO | Admitting: Family Medicine

## 2017-04-21 ENCOUNTER — Encounter: Payer: Self-pay | Admitting: Family Medicine

## 2017-04-21 VITALS — BP 110/63 | HR 50 | Temp 97.7°F | Ht 68.0 in | Wt 334.0 lb

## 2017-04-21 DIAGNOSIS — I10 Essential (primary) hypertension: Secondary | ICD-10-CM

## 2017-04-21 DIAGNOSIS — R51 Headache: Secondary | ICD-10-CM

## 2017-04-21 DIAGNOSIS — R519 Headache, unspecified: Secondary | ICD-10-CM

## 2017-04-21 DIAGNOSIS — E038 Other specified hypothyroidism: Secondary | ICD-10-CM

## 2017-04-21 DIAGNOSIS — G8929 Other chronic pain: Secondary | ICD-10-CM

## 2017-04-21 MED ORDER — TOPIRAMATE 200 MG PO TABS: 200.0000 mg | ORAL_TABLET | Freq: Two times a day (BID) | ORAL | 5 refills | Status: DC

## 2017-04-21 MED ORDER — ALPRAZOLAM 0.5 MG PO TABS: ORAL_TABLET | ORAL | 2 refills | Status: DC

## 2017-04-21 NOTE — Progress Notes (Addendum)
Subjective:  Patient ID: BENJIMIN HADDEN, male    DOB: [birth year]  Age: 46 y.o. MRN: [id number]  CC: Hypertension (pt here today for routine follow up of his chronic medical conditions)   HPI JACQUEL MCCAMISH presents for recheck of testosterone - off med due to wife going through colon surgery, was distracted. Also HA is better, but ran out of topiramate last week. Curious about its use for seizures.   follow-up of hypertension. Patient has no history of headache chest pain or shortness of breath or recent cough. Patient also denies symptoms of TIA such as numbness weakness lateralizing. Patient checks  blood pressure at home and has not had any elevated readings recently. Patient denies side effects from his medication. States taking it regularly. Patient presents for follow-up on  thyroid. The patient has a history of hypothyroidism for many years. It has been stable recently. Pt. denies any change in  voice, loss of hair, heat or cold intolerance. Energy level has been adequate to good. Patient denies constipation and diarrhea. No myxedema. Medication is as noted below. Verified that pt is taking it daily on an empty stomach. Well tolerated.  Depression screen [facility] 2/9 04/21/2017 02/24/2017 02/10/2017  Decreased Interest 0 0 0  Down, Depressed, Hopeless 0 0 0  PHQ - 2 Score 0 0 0  Altered sleeping - - -  Tired, decreased energy - - -  Change in appetite - - -  Feeling bad or failure about yourself  - - -  Trouble concentrating - - -  Moving slowly or fidgety/restless - - -  Suicidal thoughts - - -  PHQ-9 Score - - -  Difficult doing work/chores - - -    History Davis has a past medical history of Anxiety; Depression; Median nerve dysfunction; Radiculopathy of cervical region; Ulnar neuropathy at elbow; and Weakness.   He has a past surgical history that includes Rotator cuff repair (Right); Biceps tendon repair; and Neck surgery.   His family history includes Arrhythmia in his  father; Hypertension in his mother; Stroke in his mother.He reports that he has been smoking Cigarettes.  He has been smoking about 0.50 packs per day. He has quit using smokeless tobacco. His smokeless tobacco use included Snuff and Chew. He reports that he does not drink alcohol or use drugs.    ROS Review of Systems  Constitutional: Negative for chills, diaphoresis, fever and unexpected weight change.  HENT: Negative for congestion, hearing loss, rhinorrhea and sore throat.   Eyes: Negative for visual disturbance.  Respiratory: Negative for cough and shortness of breath.   Cardiovascular: Negative for chest pain.  Gastrointestinal: Negative for abdominal pain, constipation and diarrhea.  Genitourinary: Negative for dysuria and flank pain.  Musculoskeletal: Negative for arthralgias and joint swelling.  Skin: Negative for rash.  Neurological: Negative for dizziness and headaches.  Psychiatric/Behavioral: Negative for dysphoric mood and sleep disturbance.    Objective:  BP 110/63   Pulse (!) 50   Temp 97.7 F (36.5 C) (Oral)   Ht '5\' 8"'  (1.727 m)   Wt (!) 334 lb (151.5 kg)   BMI 50.78 kg/m   BP Readings from Last 3 Encounters:  04/21/17 110/63  02/24/17 123/74  02/21/17 139/71    Wt Readings from Last 3 Encounters:  04/21/17 (!) 334 lb (151.5 kg)  02/24/17 (!) 331 lb (150.1 kg)  02/21/17 (!) 330 lb (149.7 kg)     Physical Exam  Constitutional: He is oriented to person, place, and  time. He appears well-developed and well-nourished. No distress.  HENT:  Head: Normocephalic and atraumatic.  Right Ear: External ear normal.  Left Ear: External ear normal.  Nose: Nose normal.  Mouth/Throat: Oropharynx is clear and moist.  Eyes: Pupils are equal, round, and reactive to light. Conjunctivae and EOM are normal.  Neck: Normal range of motion. Neck supple. No thyromegaly present.  Cardiovascular: Normal rate, regular rhythm and normal heart sounds.   No murmur  heard. Pulmonary/Chest: Effort normal and breath sounds normal. No respiratory distress. He has no wheezes. He has no rales.  Abdominal: Soft. Bowel sounds are normal. He exhibits no distension. There is no tenderness.  Lymphadenopathy:    He has no cervical adenopathy.  Neurological: He is alert and oriented to person, place, and time. He has normal reflexes.  Skin: Skin is warm and dry.  Psychiatric: He has a normal mood and affect. His behavior is normal. Judgment and thought content normal.      Assessment & Plan:   Delfin was seen today for hypertension.  Diagnoses and all orders for this visit:  Benign essential HTN  Other specified hypothyroidism -     TSH + free T4 -     CMP14+EGFR  Chronic intractable headache, unspecified headache type  Other orders -     ALPRAZolam (XANAX) 0.5 MG tablet; TAKE ONE TABLET BY MOUTH THREE TIMES DAILY AS NEEDED FOR ANXIETY/PANIC -     topiramate (TOPAMAX) 200 MG tablet; Take 1 tablet (200 mg total) by mouth 2 (two) times daily. 1 q hs X1 wk, then 2hs X 1 wk. Then three qhs X 1 wk, then 4 qhs       I have changed Mr. Hazzard topiramate. I am also having him maintain his Fish Oil, fluticasone, budesonide-formoterol, aspirin EC, sucralfate, gabapentin, traZODone, Testosterone, levothyroxine, gemfibrozil, pantoprazole, butalbital-acetaminophen-caffeine, acetaminophen, cetirizine, SUMAtriptan, tiZANidine, LINZESS, carvedilol, DULoxetine, and ALPRAZolam.  Allergies as of 04/21/2017      Reactions   Penicillins Rash   Has patient had a PCN reaction causing immediate rash, facial/tongue/throat swelling, SOB or lightheadedness with hypotension: Yes Has patient had a PCN reaction causing severe rash involving mucus membranes or skin necrosis: No Has patient had a PCN reaction that required hospitalization: No Has patient had a PCN reaction occurring within the last 10 years: No If all of the above answers are "NO", then may proceed with  Cephalosporin use.      Medication List       Accurate as of 04/21/17  8:38 AM. Always use your most recent med list.          acetaminophen 500 MG tablet Commonly known as:  TYLENOL Take 1,000 mg by mouth every 6 (six) hours as needed.   ALPRAZolam 0.5 MG tablet Commonly known as:  XANAX TAKE ONE TABLET BY MOUTH THREE TIMES DAILY AS NEEDED FOR ANXIETY/PANIC   aspirin EC 81 MG tablet Take 81 mg by mouth daily.   budesonide-formoterol 160-4.5 MCG/ACT inhaler Commonly known as:  SYMBICORT Inhale 2 puffs into the lungs 2 (two) times daily. For COPD/Asthma   butalbital-acetaminophen-caffeine 50-325-40-30 MG capsule Commonly known as:  FIORICET/CODEINE Take 1 capsule by mouth every 4 (four) hours as needed for headache.   carvedilol 12.5 MG tablet Commonly known as:  COREG TAKE 1 TABLET BY MOUTH TWICE DAILY WITH MEALS   cetirizine 10 MG tablet Commonly known as:  ZYRTEC Take 1 tablet (10 mg total) by mouth daily. For allergy symptoms   DULoxetine 60  MG capsule Commonly known as:  CYMBALTA TAKE 2 CAPSULES BY MOUTH ONCE DAILY   Fish Oil 1000 MG Caps Take 2,000 capsules by mouth daily.   fluticasone 50 MCG/ACT nasal spray Commonly known as:  FLONASE Place 2 sprays into both nostrils daily.   gabapentin 300 MG capsule Commonly known as:  NEURONTIN Take 1 capsule (300 mg total) by mouth 3 (three) times daily.   gemfibrozil 600 MG tablet Commonly known as:  LOPID TAKE ONE TABLET BY MOUTH TWICE DAILY BEFORE A MEAL   levothyroxine 50 MCG tablet Commonly known as:  SYNTHROID, LEVOTHROID Take 1 tablet (50 mcg total) by mouth daily.   LINZESS 290 MCG Caps capsule Generic drug:  linaclotide TAKE ONE CAPSULE BY MOUTH ONCE DAILY   pantoprazole 40 MG tablet Commonly known as:  PROTONIX TAKE 1 TABLET BY MOUTH TWICE DAILY   sucralfate 1 g tablet Commonly known as:  CARAFATE Take 1 tablet (1 g total) by mouth 4 (four) times daily -  with meals and at bedtime. To heal  ulcer   SUMAtriptan 100 MG tablet Commonly known as:  IMITREX Take one at onset of HA. May repeat in 2 hours if headache persists or recurs. Limit two per 24 hours   Testosterone 20.25 MG/ACT (1.62%) Gel Commonly known as:  ANDROGEL PUMP Apply 4 pumps daily to upper chest and shoulders   tiZANidine 4 MG tablet Commonly known as:  ZANAFLEX Take 1 tablet (4 mg total) by mouth 3 (three) times daily. To prevent HA   topiramate 200 MG tablet Commonly known as:  TOPAMAX Take 1 tablet (200 mg total) by mouth 2 (two) times daily. 1 q hs X1 wk, then 2hs X 1 wk. Then three qhs X 1 wk, then 4 qhs   traZODone 100 MG tablet Commonly known as:  DESYREL TAKE ONE TABLET BY MOUTH AT BEDTIME        Follow-up: Return in about 6 months (around 10/22/2017).  Claretta Fraise, M.D.

## 2017-04-22 LAB — CMP14+EGFR
ALT: 13 IU/L (ref 0–44)
AST: 15 IU/L (ref 0–40)
Albumin/Globulin Ratio: 2 (ref 1.2–2.2)
Albumin: 4.5 g/dL (ref 3.5–5.5)
Alkaline Phosphatase: 76 IU/L (ref 39–117)
BUN/Creatinine Ratio: 16 (ref 9–20)
BUN: 12 mg/dL (ref 6–24)
Bilirubin Total: 0.3 mg/dL (ref 0.0–1.2)
CO2: 22 mmol/L (ref 20–29)
Calcium: 9.3 mg/dL (ref 8.7–10.2)
Chloride: 102 mmol/L (ref 96–106)
Creatinine, Ser: 0.76 mg/dL (ref 0.76–1.27)
GFR calc Af Amer: 129 mL/min/1.73
GFR calc non Af Amer: 112 mL/min/1.73
Globulin, Total: 2.3 g/dL (ref 1.5–4.5)
Glucose: 115 mg/dL — ABNORMAL HIGH (ref 65–99)
Potassium: 4.3 mmol/L (ref 3.5–5.2)
Sodium: 138 mmol/L (ref 134–144)
Total Protein: 6.8 g/dL (ref 6.0–8.5)

## 2017-04-22 LAB — TSH+FREE T4
FREE T4: 1.02 ng/dL (ref 0.82–1.77)
TSH: 1.37 u[IU]/mL (ref 0.450–4.500)

## 2017-04-24 DIAGNOSIS — I10 Essential (primary) hypertension: Secondary | ICD-10-CM | POA: Diagnosis not present

## 2017-04-24 DIAGNOSIS — Z4789 Encounter for other orthopedic aftercare: Secondary | ICD-10-CM | POA: Diagnosis not present

## 2017-05-05 ENCOUNTER — Other Ambulatory Visit: Payer: Self-pay | Admitting: Family Medicine

## 2017-06-16 ENCOUNTER — Other Ambulatory Visit: Payer: Self-pay | Admitting: Family Medicine

## 2017-07-05 ENCOUNTER — Emergency Department (HOSPITAL_COMMUNITY): Payer: Medicare HMO

## 2017-07-05 ENCOUNTER — Encounter (HOSPITAL_COMMUNITY): Payer: Self-pay | Admitting: Emergency Medicine

## 2017-07-05 ENCOUNTER — Emergency Department (HOSPITAL_COMMUNITY)
Admission: EM | Admit: 2017-07-05 | Discharge: 2017-07-05 | Disposition: A | Discharge status: Home / Self Care | Payer: Medicare HMO | Attending: Emergency Medicine | Admitting: Emergency Medicine

## 2017-07-05 DIAGNOSIS — Z79899 Other long term (current) drug therapy: Secondary | ICD-10-CM | POA: Insufficient documentation

## 2017-07-05 DIAGNOSIS — R0602 Shortness of breath: Secondary | ICD-10-CM | POA: Insufficient documentation

## 2017-07-05 DIAGNOSIS — R0789 Other chest pain: Secondary | ICD-10-CM | POA: Insufficient documentation

## 2017-07-05 DIAGNOSIS — R51 Headache: Secondary | ICD-10-CM | POA: Diagnosis not present

## 2017-07-05 DIAGNOSIS — Z7982 Long term (current) use of aspirin: Secondary | ICD-10-CM | POA: Insufficient documentation

## 2017-07-05 DIAGNOSIS — R079 Chest pain, unspecified: Secondary | ICD-10-CM | POA: Diagnosis not present

## 2017-07-05 DIAGNOSIS — I10 Essential (primary) hypertension: Secondary | ICD-10-CM | POA: Diagnosis not present

## 2017-07-05 DIAGNOSIS — F17210 Nicotine dependence, cigarettes, uncomplicated: Secondary | ICD-10-CM | POA: Diagnosis not present

## 2017-07-05 LAB — BASIC METABOLIC PANEL
Anion gap: 10 (ref 5–15)
BUN: 10 mg/dL (ref 6–20)
CHLORIDE: 96 mmol/L — AB (ref 101–111)
CO2: 25 mmol/L (ref 22–32)
CREATININE: 0.84 mg/dL (ref 0.61–1.24)
Calcium: 9.4 mg/dL (ref 8.9–10.3)
GFR calc Af Amer: >60 mL/min (ref >60)
GFR calc non Af Amer: >60 mL/min (ref >60)
Glucose, Bld: 101 mg/dL — ABNORMAL HIGH (ref 65–99)
Potassium: 4 mmol/L (ref 3.5–5.1)
Sodium: 131 mmol/L — ABNORMAL LOW (ref 135–145)

## 2017-07-05 LAB — HEPATIC FUNCTION PANEL
ALK PHOS: 67 U/L (ref 38–126)
ALT: 15 U/L — AB (ref 17–63)
AST: 16 U/L (ref 15–41)
Albumin: 4.1 g/dL (ref 3.5–5.0)
BILIRUBIN DIRECT: 0.1 mg/dL (ref 0.1–0.5)
BILIRUBIN INDIRECT: 0.7 mg/dL (ref 0.3–0.9)
BILIRUBIN TOTAL: 0.8 mg/dL (ref 0.3–1.2)
TOTAL PROTEIN: 7.4 g/dL (ref 6.5–8.1)

## 2017-07-05 LAB — CBC
HCT: 40.5 % (ref 39.0–52.0)
Hemoglobin: 13.8 g/dL (ref 13.0–17.0)
MCH: 31.5 pg (ref 26.0–34.0)
MCHC: 34.1 g/dL (ref 30.0–36.0)
MCV: 92.5 fL (ref 78.0–100.0)
PLATELETS: 251 10*3/uL (ref 150–400)
RBC: 4.38 MIL/uL (ref 4.22–5.81)
RDW: 13.5 % (ref 11.5–15.5)
WBC: 8.1 10*3/uL (ref 4.0–10.5)

## 2017-07-05 LAB — I-STAT TROPONIN, ED
Troponin i, poc: 0 ng/mL (ref 0.00–0.08)
Troponin i, poc: 0 ng/mL (ref 0.00–0.08)

## 2017-07-05 LAB — D-DIMER, QUANTITATIVE: D-Dimer, Quant: 0.38 ug/mL-FEU (ref 0.00–0.50)

## 2017-07-05 MED ORDER — NITROGLYCERIN 0.4 MG SL SUBL
0.4000 mg | SUBLINGUAL_TABLET | SUBLINGUAL | Status: DC | PRN
Start: 2017-07-05 — End: 2017-07-05
  Administered 2017-07-05 (×2): 0.4 mg via SUBLINGUAL
  Filled 2017-07-05: qty 1

## 2017-07-05 MED ORDER — ASPIRIN 81 MG PO CHEW
162.0000 mg | CHEWABLE_TABLET | Freq: Once | ORAL | Status: AC
  Administered 2017-07-05: 162 mg via ORAL
  Filled 2017-07-05: qty 2

## 2017-07-05 MED ORDER — PREDNISONE 20 MG PO TABS
ORAL_TABLET | ORAL | 0 refills | Status: DC
Start: 2017-07-05 — End: 2017-07-11

## 2017-07-05 MED ORDER — ALBUTEROL SULFATE (2.5 MG/3ML) 0.083% IN NEBU
5.0000 mg | INHALATION_SOLUTION | Freq: Once | RESPIRATORY_TRACT | Status: AC
Start: 2017-07-05 — End: 2017-07-05
  Administered 2017-07-05: 5 mg via RESPIRATORY_TRACT
  Filled 2017-07-05: qty 6

## 2017-07-05 MED ORDER — ALBUTEROL SULFATE HFA 108 (90 BASE) MCG/ACT IN AERS: 1.0000 | INHALATION_SPRAY | Freq: Four times a day (QID) | RESPIRATORY_TRACT | 0 refills | Status: DC | PRN

## 2017-07-05 NOTE — ED Notes (Signed)
Pt removed self from monitor

## 2017-07-05 NOTE — ED Provider Notes (Signed)
[facility]-EMERGENCY DEPT Provider Note   CSN: [id number] Arrival date & time: 07/05/17  1244     History   Chief Complaint Chief Complaint  Patient presents with  . Chest Pain  . Shortness of Breath  . Headache    HPI Roy [name] is a 46 y.o. male.  Patient is a 46 year old male with a history of anxiety, depression, hypertension, tobacco abuse, morbid obesity presenting today with chest pain and shortness of breath. Patient states 3 days ago he noticed intermittent bouts of chest pressure that made him feel that he could not catch his breath. They initially were short episodes that resolved but this morning since he's woken up he's had persistent pain which waxes and wanes in severity. Eating, deep breathing or coughing did not seem to make it worse. He denies radiation of the pain. He has no abdominal pain, nausea or vomiting. He denies any fever or productive cough. He was diagnosed with asthma by his PCP and was placed on Symbicort. Patient states that when the symptoms started he even tried to use his daughter's pro-air but it did not improve his symptoms. He did take 2 baby aspirin this morning without improvement in pain. He denies any prior cardiac history and states his dad did have to get a stent but he is unclear at what age.   The history is provided by the patient.  Chest Pain   This is a new problem. Episode onset: 3 days ago. The problem occurs daily. The problem has been gradually worsening. The pain is present in the substernal region. The pain is at a severity of 4/10. The pain is moderate. The quality of the pain is described as pressure-like. The pain does not radiate. Exacerbated by: unknown. Associated symptoms include headaches and shortness of breath. Pertinent negatives include no abdominal pain, no cough, no diaphoresis, no exertional chest pressure, no fever, no nausea and no sputum production.  Shortness of Breath  Associated symptoms include headaches and chest  pain. Pertinent negatives include no fever, no cough, no sputum production and no abdominal pain.  Headache   Associated symptoms include shortness of breath. Pertinent negatives include no fever and no nausea.    Past Medical History:  Diagnosis Date  . Anxiety   . Depression   . Median nerve dysfunction   . Radiculopathy of cervical region   . Ulnar neuropathy at elbow   . Weakness     Patient Active Problem List   Diagnosis Date Noted  . Headache 01/22/2016  . Nicotine abuse 12/21/2015  . Benign essential HTN 12/21/2015  . Hypothyroid 12/21/2015  . Morbid obesity (HCC) 10/19/2015  . 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 Lumbar radiculopathy 08/11/2013  . 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 Hypotestosteronism 03/29/2013    Past Surgical History:  Procedure Laterality Date  . BICEPS TENDON REPAIR    . NECK SURGERY     Fusion done two seperate times  . ROTATOR CUFF REPAIR Right        Home Medications    Prior to Admission medications   Medication Sig Start Date End Date Taking? Authorizing Provider  acetaminophen (TYLENOL) 500 MG tablet Take 1,000 mg by mouth every 6 (six) hours as needed.    [provider]  ALPRAZolam Prudy Feeler) 0.5 MG tablet TAKE ONE TABLET BY MOUTH THREE TIMES DAILY AS NEEDED FOR ANXIETY/PANIC 04/21/17   Mechele Claude, MD  aspirin EC 81 MG tablet Take 81 mg by mouth daily.  [provider]  budesonide-formoterol (SYMBICORT) 160-4.5 MCG/ACT inhaler Inhale 2 puffs into the lungs 2 (two) times daily. For COPD/Asthma 08/26/16   Mechele Claude, MD  butalbital-acetaminophen-caffeine (FIORICET/CODEINE) [phone] MG capsule Take 1 capsule by mouth every 4 (four) hours as needed for headache. 02/10/17   Mechele Claude, MD  carvedilol (COREG) 12.5 MG tablet TAKE 1 TABLET BY MOUTH TWICE DAILY WITH MEALS 03/19/17   Mechele Claude, MD  carvedilol (COREG) 12.5 MG tablet TAKE 1 TABLET BY MOUTH TWICE DAILY WITH MEALS 06/16/17   Mechele Claude, MD    cetirizine (ZYRTEC) 10 MG tablet Take 1 tablet (10 mg total) by mouth daily. For allergy symptoms 02/24/17   Mechele Claude, MD  DULoxetine (CYMBALTA) 60 MG capsule TAKE 2 CAPSULES BY MOUTH ONCE DAILY 04/15/17   Mechele Claude, MD  fluticasone [facility]) 50 MCG/ACT nasal spray Place 2 sprays into both nostrils daily. 06/24/16   Elenora Gamma, MD  gabapentin (NEURONTIN) 300 MG capsule Take 1 capsule (300 mg total) by mouth 3 (three) times daily. 12/30/16   Mechele Claude, MD  gemfibrozil (LOPID) 600 MG tablet TAKE ONE TABLET BY MOUTH TWICE DAILY BEFORE A MEAL Patient taking differently: Take 600 mg by mouth 2 (two) times daily. TAKE ONE TABLET BY MOUTH TWICE DAILY BEFORE A MEAL 01/30/17   Mechele Claude, MD  levothyroxine (SYNTHROID, LEVOTHROID) 50 MCG tablet Take 1 tablet (50 mcg total) by mouth daily. 01/20/17   Mechele Claude, MD  LINZESS 290 MCG CAPS capsule TAKE ONE CAPSULE BY MOUTH ONCE DAILY 03/19/17   Mechele Claude, MD  Omega-3 Fatty Acids (FISH OIL) 1000 MG CAPS Take 2,000 capsules by mouth daily.     [provider]  pantoprazole (PROTONIX) 40 MG tablet TAKE 1 TABLET BY MOUTH TWICE DAILY 02/03/17   Mechele Claude, MD  pantoprazole (PROTONIX) 40 MG tablet TAKE 1 TABLET BY MOUTH TWICE DAILY 05/06/17   Mechele Claude, MD  sucralfate (CARAFATE) 1 g tablet Take 1 tablet (1 g total) by mouth 4 (four) times daily -  with meals and at bedtime. To heal ulcer 10/07/16   Mechele Claude, MD  SUMAtriptan (IMITREX) 100 MG tablet Take one at onset of HA. May repeat in 2 hours if headache persists or recurs. Limit two per 24 hours 02/24/17   Mechele Claude, MD  Testosterone (ANDROGEL PUMP) 20.25 MG/ACT (1.62%) GEL Apply 4 pumps daily to upper chest and shoulders 01/20/17   Mechele Claude, MD  tiZANidine (ZANAFLEX) 4 MG tablet Take 1 tablet (4 mg total) by mouth 3 (three) times daily. To prevent HA 02/24/17   Mechele Claude, MD  topiramate (TOPAMAX) 200 MG tablet Take 1 tablet (200 mg total) by mouth 2 (two)  times daily. 1 q hs X1 wk, then 2hs X 1 wk. Then three qhs X 1 wk, then 4 qhs 04/21/17   Mechele Claude, MD  traZODone (DESYREL) 100 MG tablet TAKE ONE TABLET BY MOUTH AT BEDTIME 01/10/17   Mechele Claude, MD    Family History Family History  Problem Relation Age of Onset  . Hypertension Mother   . Stroke Mother   . Arrhythmia Father     Social History Social History  Substance Use Topics  . Smoking status: Current Every Day Smoker    Packs/day: 0.50    Types: Cigarettes  . Smokeless tobacco: Former Neurosurgeon    Types: Snuff, Chew  . Alcohol use No     Allergies   Penicillins   Review of Systems Review of Systems  Constitutional: Negative for diaphoresis and fever.  Respiratory: Positive for shortness of breath. Negative for cough and sputum production.   Cardiovascular: Positive for chest pain.  Gastrointestinal: Negative for abdominal pain and nausea.  Neurological: Positive for headaches.  All other systems reviewed and are negative.    Physical Exam Updated Vital Signs BP 139/89 (BP Location: Right Arm)   Pulse 64   Temp 97.9 F (36.6 C) (Oral)   Resp (!) 22   Ht  (1.727 m)   Wt (!) 149.7 kg (330 lb)   SpO2 93%   BMI 50.18 kg/m   Physical Exam  Constitutional: He is oriented to person, place, and time. He appears well-developed and well-nourished. No distress.  Morbidly obese  HENT:  Head: Normocephalic and atraumatic.  Mouth/Throat: Oropharynx is clear and moist.  Eyes: Pupils are equal, round, and reactive to light. Conjunctivae and EOM are normal.  Neck: Normal range of motion. Neck supple.  Cardiovascular: Normal rate, regular rhythm and intact distal pulses.   No murmur heard. Pulmonary/Chest: Effort normal and breath sounds normal. No respiratory distress. He has no wheezes. He has no rales.  Abdominal: Soft. He exhibits no distension. There is no tenderness. There is no rebound and no guarding.  Musculoskeletal: Normal range of motion. He  exhibits no edema or tenderness.  Neurological: He is alert and oriented to person, place, and time.  Skin: Skin is warm and dry. Capillary refill takes less than 2 seconds. No rash noted. No erythema.  Psychiatric: He has a normal mood and affect. His behavior is normal.  Nursing note and vitals reviewed.    ED Treatments / Results  Labs (all labs ordered are listed, but only abnormal results are displayed) Labs Reviewed  CBC  BASIC METABOLIC PANEL  D-DIMER, QUANTITATIVE (NOT AT [facility])  HEPATIC FUNCTION PANEL  I-STAT TROPONIN, ED    EKG  EKG Interpretation  Date/Time:  Saturday July 05 2017 12:52:26 EDT Ventricular Rate:  66 PR Interval:  152 QRS Duration: 84 QT Interval:  400 QTC Calculation: 419 R Axis:   8 Text Interpretation:  Normal sinus rhythm Normal ECG No significant change since last tracing Confirmed by Gwyneth Sprout ([id number]) on 07/05/2017 1:38:52 PM       Radiology No results found.  Procedures Procedures (including critical care time)  Medications Ordered in ED Medications  aspirin chewable tablet 162 mg (not administered)  nitroGLYCERIN (NITROSTAT) SL tablet 0.4 mg (not administered)     Initial Impression / Assessment and Plan / ED Course  I have reviewed the triage vital signs and the nursing notes.  Pertinent labs & imaging results that were available during my care of the patient were reviewed by me and considered in my medical decision making (see chart for details).     Patient presenting with chest pain and shortness of breath. Possibility for ACS given patient's risk factors and description of pain versus PE versus CHF. However patient does not have orthopnea and no significant swelling in the distal extremities. Breath sounds bilaterally with low suspicion for pneumothorax. Patient has a possible history of asthma but has no wheezing on exam.  EKG without acute findings.CBC, CMP, troponin, BNP, d-dimer pending.  Patient given aspirin  and nitroglycerin as he is currently having pain. Vital signs within normal limits except for oxygen saturation is 93% on room air.  Final Clinical Impressions(s) / ED Diagnoses   Final diagnoses:  None    New Prescriptions New Prescriptions   No medications  on file     Gwyneth Sprout, MD 07/06/17 [phone]

## 2017-07-05 NOTE — ED Notes (Signed)
Pt understood dc material. NAD noted. Scripts given at dc 

## 2017-07-05 NOTE — ED Notes (Signed)
Pt refused 3rd Nitro from previous RN related to HA from [facility]

## 2017-07-05 NOTE — ED Triage Notes (Signed)
Pt. Stated, I started having chest pain with SOB 3-4 days ago with a headache. I've had it before and they said it was anxiety. I took a Xanax and it didn't help.

## 2017-07-07 ENCOUNTER — Encounter: Payer: Self-pay | Admitting: Family Medicine

## 2017-07-07 ENCOUNTER — Ambulatory Visit (INDEPENDENT_AMBULATORY_CARE_PROVIDER_SITE_OTHER): Payer: Medicare HMO

## 2017-07-07 ENCOUNTER — Other Ambulatory Visit: Payer: Self-pay | Admitting: Family Medicine

## 2017-07-07 ENCOUNTER — Ambulatory Visit (INDEPENDENT_AMBULATORY_CARE_PROVIDER_SITE_OTHER): Payer: Medicare HMO | Admitting: Family Medicine

## 2017-07-07 VITALS — BP 124/66 | HR 66 | Temp 98.4°F | Ht 68.0 in | Wt 335.0 lb

## 2017-07-07 DIAGNOSIS — R0789 Other chest pain: Secondary | ICD-10-CM

## 2017-07-07 NOTE — Progress Notes (Signed)
Subjective:  Patient ID: Roy [name], male    DOB: 11/25/1970  Age: 46 y.o. MRN: [id number]  CC: Hospitalization Follow-up (pt here today following up after going to ED on 10/13 and they told him to follow up with cardiology but pt is concerned he may need to see pulmonology.)   HPI Roy [name] presents for Onset of chest pain and dyspnea 2 days ago. The chest pain was not radiating. He went to the emergency room for this. That report was reviewed. It was felt to be noncardiac at the time. He had an EKG which was essentially normal. The chest pain was associated by shortness of breath that persists. There is some central chest pain as well he describes it as a 6/10 unchanged since going to the emergency room. He was asked to follow-up with cardiology. The patient does not want to do that and he asked me if there is any reason to do so. The pain has been episodic. Not related to exertion with regard to the chest pain some exertional dyspnea however for the most part the to go together. Review of the chart reveals that 2-1/2 years ago he had a myoview stress test that did not show ischemia. He had an echocardiogram 1-1/2 years ago that was read as normal with exception of borderline left ventricular diastolic relaxation defect. There was an EKG performed last yearThat is similar to the one done today. He denies any lower extremity edema or tenderness.  Depression screen [facility] 2/9 07/07/2017 04/21/2017 02/24/2017  Decreased Interest 0 0 0  Down, Depressed, Hopeless 0 0 0  PHQ - 2 Score 0 0 0  Altered sleeping - - -  Tired, decreased energy - - -  Change in appetite - - -  Feeling bad or failure about yourself  - - -  Trouble concentrating - - -  Moving slowly or fidgety/restless - - -  Suicidal thoughts - - -  PHQ-9 Score - - -  Difficult doing work/chores - - -    History Roy [name] has a past medical history of Anxiety; Depression; Median nerve dysfunction; Radiculopathy of cervical region;  Ulnar neuropathy at elbow; and Weakness.   He has a past surgical history that includes Rotator cuff repair (Right); Biceps tendon repair; and Neck surgery.   His family history includes Arrhythmia in his father; Hypertension in his mother; Stroke in his mother.He reports that he has been smoking Cigarettes.  He has been smoking about 0.50 packs per day. He has quit using smokeless tobacco. His smokeless tobacco use included Snuff and Chew. He reports that he does not drink alcohol or use drugs.    ROS Review of Systems  Constitutional: Negative for chills, diaphoresis, fever and unexpected weight change.  HENT: Negative for congestion, hearing loss, rhinorrhea and sore throat.   Eyes: Negative for visual disturbance.  Respiratory: Positive for shortness of breath. Negative for cough.   Cardiovascular: Positive for chest pain.  Gastrointestinal: Negative for abdominal pain, constipation and diarrhea.  Genitourinary: Negative for dysuria and flank pain.  Musculoskeletal: Negative for arthralgias and joint swelling.  Skin: Negative for rash.  Neurological: Negative for dizziness and headaches.  Psychiatric/Behavioral: Negative for dysphoric mood and sleep disturbance.    Objective:  BP 124/66   Pulse 66   Temp 98.4 F (36.9 C) (Oral)   Ht  (1.727 m)   Wt (!) 335 lb (152 kg)   BMI 50.94 kg/m   BP Readings from Last 3  Encounters:  07/07/17 124/66  07/05/17 139/72  04/21/17 110/63    Wt Readings from Last 3 Encounters:  07/07/17 (!) 335 lb (152 kg)  07/05/17 (!) 330 lb (149.7 kg)  04/21/17 (!) 334 lb (151.5 kg)     Physical Exam  Constitutional: He is oriented to person, place, and time. He appears well-developed and well-nourished. No distress.  HENT:  Head: Normocephalic and atraumatic.  Right Ear: External ear normal.  Left Ear: External ear normal.  Nose: Nose normal.  Mouth/Throat: Oropharynx is clear and moist.  Eyes: Pupils are equal, round, and reactive  to light. Conjunctivae and EOM are normal.  Neck: Normal range of motion. Neck supple. No thyromegaly present.  Cardiovascular: Normal rate, regular rhythm and normal heart sounds.   No murmur heard. Pulmonary/Chest: Effort normal and breath sounds normal. No respiratory distress. He has no wheezes. He has no rales.  Abdominal: Soft. Bowel sounds are normal. He exhibits no distension. There is no tenderness.  Lymphadenopathy:    He has no cervical adenopathy.  Neurological: He is alert and oriented to person, place, and time. He has normal reflexes.  Skin: Skin is warm and dry.  Psychiatric: He has a normal mood and affect. His behavior is normal. Judgment and thought content normal.   EKG- NSR, no ischemic changes CXR - NAD   Assessment & Plan:   Roy [name] was seen today for hospitalization follow-up.  Diagnoses and all orders for this visit:  Other chest pain -     EKG 12-Lead -     DG Chest 2 View; Future  Reassured regarding test results. No sign of cardiac disease. Additionally he's had normal cardiac workup recently. He'll let me know if his symptoms worsen particularly with regard to episodic central chest pain with radiation. Additionally if the shortness of breath worsens will consider PE. For now he should just continue the prednisone. Let me know if he develops swelling in the feet and ankles as well.     I have discontinued Mr. Roy [name] sucralfate, butalbital-acetaminophen-caffeine, cetirizine, SUMAtriptan, tiZANidine, and topiramate. I am also having him maintain his Fish Oil, fluticasone, budesonide-formoterol, aspirin EC, gabapentin, traZODone, Testosterone, levothyroxine, gemfibrozil, acetaminophen, LINZESS, DULoxetine, ALPRAZolam, pantoprazole, carvedilol, predniSONE, and albuterol.  Allergies as of 07/07/2017      Reactions   Penicillins Rash   Has patient had a PCN reaction causing immediate rash, facial/tongue/throat swelling, SOB or lightheadedness with  hypotension: Yes Has patient had a PCN reaction causing severe rash involving mucus membranes or skin necrosis: No Has patient had a PCN reaction that required hospitalization: No Has patient had a PCN reaction occurring within the last 10 years: No If all of the above answers are "NO", then may proceed with Cephalosporin use.      Medication List       Accurate as of 07/07/17  5:11 PM. Always use your most recent med list.          acetaminophen 500 MG tablet Commonly known as:  TYLENOL Take 1,000 mg by mouth every 6 (six) hours as needed for headache (pain).   albuterol 108 (90 Base) MCG/ACT inhaler Commonly known as:  PROVENTIL HFA;VENTOLIN HFA Inhale 1-2 puffs into the lungs every 6 (six) hours as needed for wheezing or shortness of breath.   ALPRAZolam 0.5 MG tablet Commonly known as:  XANAX TAKE ONE TABLET BY MOUTH THREE TIMES DAILY AS NEEDED FOR ANXIETY/PANIC   aspirin EC 81 MG tablet Take 81 mg by mouth daily.  budesonide-formoterol 160-4.5 MCG/ACT inhaler Commonly known as:  SYMBICORT Inhale 2 puffs into the lungs 2 (two) times daily. For COPD/Asthma   carvedilol 12.5 MG tablet Commonly known as:  COREG TAKE 1 TABLET BY MOUTH TWICE DAILY WITH MEALS   DULoxetine 60 MG capsule Commonly known as:  CYMBALTA TAKE 2 CAPSULES BY MOUTH ONCE DAILY   Fish Oil 1000 MG Caps Take 1,000 mg by mouth daily.   fluticasone 50 MCG/ACT nasal spray Commonly known as:  FLONASE Place 2 sprays into both nostrils daily.   gabapentin 300 MG capsule Commonly known as:  NEURONTIN Take 1 capsule (300 mg total) by mouth 3 (three) times daily.   gemfibrozil 600 MG tablet Commonly known as:  LOPID TAKE ONE TABLET BY MOUTH TWICE DAILY BEFORE A MEAL   levothyroxine 50 MCG tablet Commonly known as:  SYNTHROID, LEVOTHROID Take 1 tablet (50 mcg total) by mouth daily.   LINZESS 290 MCG Caps capsule Generic drug:  linaclotide TAKE ONE CAPSULE BY MOUTH ONCE DAILY   pantoprazole 40  MG tablet Commonly known as:  PROTONIX TAKE 1 TABLET BY MOUTH TWICE DAILY   predniSONE 20 MG tablet Commonly known as:  DELTASONE 3 tabs po day one, then 2 po daily x 4 days   Testosterone 20.25 MG/ACT (1.62%) Gel Commonly known as:  ANDROGEL PUMP Apply 4 pumps daily to upper chest and shoulders   traZODone 100 MG tablet Commonly known as:  DESYREL TAKE ONE TABLET BY MOUTH AT BEDTIME        Follow-up: Return if symptoms worsen or fail to improve.  Mechele Claude, M.D.

## 2017-07-10 ENCOUNTER — Other Ambulatory Visit: Payer: Self-pay | Admitting: Family Medicine

## 2017-07-10 DIAGNOSIS — M5416 Radiculopathy, lumbar region: Secondary | ICD-10-CM

## 2017-07-11 ENCOUNTER — Ambulatory Visit (INDEPENDENT_AMBULATORY_CARE_PROVIDER_SITE_OTHER): Payer: Medicare HMO | Admitting: Family Medicine

## 2017-07-11 ENCOUNTER — Encounter: Payer: Self-pay | Admitting: Family Medicine

## 2017-07-11 VITALS — BP 127/75 | HR 59 | Temp 98.3°F | Ht 68.0 in | Wt 338.0 lb

## 2017-07-11 DIAGNOSIS — K279 Peptic ulcer, site unspecified, unspecified as acute or chronic, without hemorrhage or perforation: Secondary | ICD-10-CM | POA: Diagnosis not present

## 2017-07-11 DIAGNOSIS — K5904 Chronic idiopathic constipation: Secondary | ICD-10-CM | POA: Diagnosis not present

## 2017-07-11 DIAGNOSIS — R0600 Dyspnea, unspecified: Secondary | ICD-10-CM

## 2017-07-11 DIAGNOSIS — F411 Generalized anxiety disorder: Secondary | ICD-10-CM

## 2017-07-11 DIAGNOSIS — R1013 Epigastric pain: Secondary | ICD-10-CM

## 2017-07-11 MED ORDER — PLECANATIDE 3 MG PO TABS: 3.0000 mg | ORAL_TABLET | Freq: Every day | ORAL | 5 refills | Status: DC

## 2017-07-11 MED ORDER — DEXLANSOPRAZOLE 60 MG PO CPDR: 60.0000 mg | DELAYED_RELEASE_CAPSULE | Freq: Every day | ORAL | 5 refills | Status: DC

## 2017-07-11 NOTE — Patient Instructions (Addendum)
It is very important that you take your Linzess daily. I have ordered a replacement for it that might be more satisfactory called Trulance. However, it will require prior authorization. Please take the Linzess daily until we can get the treatments approved for you.

## 2017-07-11 NOTE — Progress Notes (Signed)
Subjective:  Patient ID: Roy [name], male    DOB: 12/26/1970  Age: 46 y.o. MRN: [id number]  CC: Chest Pain (pt here today to follow up after finishing prednisone and still having epigastric pain and he thinks the breathing is panic attacks)   HPI Roy GarfinkelStephen M [name] presents for Pain in the epigastrium, radiating substernally that is fairly constant but waxes and wanes. It has a sharpness, burning and an ache to it. Patient is concerned that it might be panic attacks because he's having some shortness of breath associated. He is still using his Symbicort regularly. He begins breathing hard and feels like he is hyperventilating when this occurs. However, there is no sense of panic, impending doom or anxiety associated. Pt. Recently had EKG, troponins and  D-dimer in E.D. All were normal. Additionally CXR and EKG repeated here 4 days ago were normal. Pt. Confirms using the inhalers. However, he says that Linzess causes equal urgency so he only takes it once every few days. And his bowel movements are only coming about once every couple of weeks.  [facility] 07/11/2017 07/07/2017 04/21/2017  Decreased Interest 0 0 0  Down, Depressed, Hopeless 0 0 0  PHQ - 2 Score 0 0 0  Altered sleeping - - -  Tired, decreased energy - - -  Change in appetite - - -  Feeling bad or failure about yourself  - - -  Trouble concentrating - - -  Moving slowly or fidgety/restless - - -  Suicidal thoughts - - -  PHQ-9 Score - - -  Difficult doing work/chores - - -    History Roy SeniorStephen has a past medical history of Anxiety; Depression; Median nerve dysfunction; Radiculopathy of cervical region; Ulnar neuropathy at elbow; and Weakness.   He has a past surgical history that includes Rotator cuff repair (Right); Biceps tendon repair; and Neck surgery.   His family history includes Arrhythmia in his father; Hypertension in his mother; Stroke in his mother.He reports that he has been smoking Cigarettes.  He  has been smoking about 0.50 packs per day. He has quit using smokeless tobacco. His smokeless tobacco use included Snuff and Chew. He reports that he does not drink alcohol or use drugs.    ROS Review of Systems  Constitutional: Negative for chills, diaphoresis, fever and unexpected weight change.  HENT: Negative for congestion, hearing loss, rhinorrhea and sore throat.   Eyes: Negative for visual disturbance.  Respiratory: Negative for cough and shortness of breath.   Cardiovascular: Negative for chest pain.  Gastrointestinal: Positive for abdominal pain, constipation and diarrhea.  Genitourinary: Negative for dysuria and flank pain.  Musculoskeletal: Negative for arthralgias and joint swelling.  Skin: Negative for rash.  Neurological: Negative for dizziness and headaches.  Psychiatric/Behavioral: Negative for dysphoric mood and sleep disturbance.    Objective:  BP 127/75   Pulse (!) 59   Temp 98.3 F (36.8 C) (Oral)   Ht 5\' 8"  (1.727 m)   Wt (!) 338 lb (153.3 kg)   BMI 51.39 kg/m   BP Readings from Last 3 Encounters:  07/11/17 127/75  07/07/17 124/66  07/05/17 139/72    Wt Readings from Last 3 Encounters:  07/11/17 (!) 338 lb (153.3 kg)  07/07/17 (!) 335 lb (152 kg)  07/05/17 (!) 330 lb (149.7 kg)     Physical Exam  Constitutional: He is oriented to person, place, and time. He appears well-developed and well-nourished. No distress.  HENT:  Head: Normocephalic and  atraumatic.  Right Ear: External ear normal.  Left Ear: External ear normal.  Nose: Nose normal.  Mouth/Throat: Oropharynx is clear and moist.  Eyes: Pupils are equal, round, and reactive to light. Conjunctivae and EOM are normal.  Neck: Normal range of motion. Neck supple. No thyromegaly present.  Cardiovascular: Normal rate, regular rhythm and normal heart sounds.   No murmur heard. Pulmonary/Chest: Effort normal and breath sounds normal. No respiratory distress. He has no wheezes. He has no rales.    Abdominal: Soft. Bowel sounds are normal. He exhibits no distension. There is no tenderness.  Lymphadenopathy:    He has no cervical adenopathy.  Neurological: He is alert and oriented to person, place, and time. He has normal reflexes.  Skin: Skin is warm and dry.  Psychiatric: He has a normal mood and affect. His behavior is normal. Judgment and thought content normal.      Assessment & Plan:   Roy [name] was seen today for chest pain.  Diagnoses and all orders for this visit:  Epigastric pain  Chronic idiopathic constipation  GAD (generalized anxiety disorder)  PUD (peptic ulcer disease)  Dyspnea, unspecified type  Other orders -     Plecanatide (TRULANCE) 3 MG TABS; Take 3 mg by mouth daily. -     dexlansoprazole (DEXILANT) 60 MG capsule; Take 1 capsule (60 mg total) by mouth daily.       I have discontinued Mr. Roy [name], pantoprazole, and predniSONE. I am also having him start on Plecanatide and dexlansoprazole. Additionally, I am having him maintain his Fish Oil, fluticasone, aspirin EC, gabapentin, Testosterone, levothyroxine, gemfibrozil, acetaminophen, ALPRAZolam, carvedilol, albuterol, traZODone, SYMBICORT, and DULoxetine.  Allergies as of 07/11/2017      Reactions   Penicillins Rash   Has patient had a PCN reaction causing immediate rash, facial/tongue/throat swelling, SOB or lightheadedness with hypotension: Yes Has patient had a PCN reaction causing severe rash involving mucus membranes or skin necrosis: No Has patient had a PCN reaction that required hospitalization: No Has patient had a PCN reaction occurring within the last 10 years: No If all of the above answers are "NO", then may proceed with Cephalosporin use.      Medication List       Accurate as of 07/11/17 11:44 AM. Always use your most recent med list.          acetaminophen 500 MG tablet Commonly known as:  TYLENOL Take 1,000 mg by mouth every 6 (six) hours as needed for  headache (pain).   albuterol 108 (90 Base) MCG/ACT inhaler Commonly known as:  PROVENTIL HFA;VENTOLIN HFA Inhale 1-2 puffs into the lungs every 6 (six) hours as needed for wheezing or shortness of breath.   ALPRAZolam 0.5 MG tablet Commonly known as:  XANAX TAKE ONE TABLET BY MOUTH THREE TIMES DAILY AS NEEDED FOR ANXIETY/PANIC   aspirin EC 81 MG tablet Take 81 mg by mouth daily.   carvedilol 12.5 MG tablet Commonly known as:  COREG TAKE 1 TABLET BY MOUTH TWICE DAILY WITH MEALS   dexlansoprazole 60 MG capsule Commonly known as:  DEXILANT Take 1 capsule (60 mg total) by mouth daily.   DULoxetine 60 MG capsule Commonly known as:  CYMBALTA TAKE 2 CAPSULES BY MOUTH ONCE DAILY   Fish Oil 1000 MG Caps Take 1,000 mg by mouth daily.   fluticasone 50 MCG/ACT nasal spray Commonly known as:  FLONASE Place 2 sprays into both nostrils daily.   gabapentin 300 MG capsule Commonly known as:  NEURONTIN  Take 1 capsule (300 mg total) by mouth 3 (three) times daily.   gemfibrozil 600 MG tablet Commonly known as:  LOPID TAKE ONE TABLET BY MOUTH TWICE DAILY BEFORE A MEAL   levothyroxine 50 MCG tablet Commonly known as:  SYNTHROID, LEVOTHROID Take 1 tablet (50 mcg total) by mouth daily.   Plecanatide 3 MG Tabs Commonly known as:  TRULANCE Take 3 mg by mouth daily.   SYMBICORT 160-4.5 MCG/ACT inhaler Generic drug:  budesonide-formoterol INHALE TWO PUFFS BY MOUTH TWICE DAILY FOR COPD/ASTHMA   Testosterone 20.25 MG/ACT (1.62%) Gel Commonly known as:  ANDROGEL PUMP Apply 4 pumps daily to upper chest and shoulders   traZODone 100 MG tablet Commonly known as:  DESYREL TAKE 1 TABLET BY MOUTH AT BEDTIME      It is very important that you take your Linzess daily. I have ordered a replacement for it that might be more satisfactory called Trulance. However, it will require prior authorization. Please take the Linzess daily until we can get the treatments approved for you. I have also  ordered Dexilant to replace the Protonix. I also recommend to follow up as soon as possible with your GI doctor.  Follow-up: Return in about 2 weeks (around 07/25/2017) for abd.Mechele Claude, M.D.

## 2017-07-15 ENCOUNTER — Telehealth: Payer: Self-pay

## 2017-07-15 NOTE — Telephone Encounter (Signed)
Please contact the patient let him know that the new med for his bowels was not approved. Resume the Linzess

## 2017-07-15 NOTE — Telephone Encounter (Signed)
Insurance non preferred Trulance  Preferred are lactulose oral solution and polyethylene glycol 3350 oral powder packet

## 2017-07-16 NOTE — Telephone Encounter (Signed)
Patient aware and verbalizes understanding. 

## 2017-07-22 DIAGNOSIS — R1013 Epigastric pain: Secondary | ICD-10-CM | POA: Diagnosis not present

## 2017-07-22 DIAGNOSIS — R140 Abdominal distension (gaseous): Secondary | ICD-10-CM | POA: Diagnosis not present

## 2017-07-22 DIAGNOSIS — R109 Unspecified abdominal pain: Secondary | ICD-10-CM | POA: Diagnosis not present

## 2017-07-24 ENCOUNTER — Ambulatory Visit (INDEPENDENT_AMBULATORY_CARE_PROVIDER_SITE_OTHER): Payer: Medicare HMO | Admitting: Family Medicine

## 2017-07-24 ENCOUNTER — Encounter: Payer: Self-pay | Admitting: Family Medicine

## 2017-07-24 VITALS — BP 128/68 | HR 61 | Temp 98.6°F | Ht 68.0 in | Wt 342.0 lb

## 2017-07-24 DIAGNOSIS — I10 Essential (primary) hypertension: Secondary | ICD-10-CM | POA: Diagnosis not present

## 2017-07-24 DIAGNOSIS — K2100 Gastro-esophageal reflux disease with esophagitis, without bleeding: Secondary | ICD-10-CM

## 2017-07-24 DIAGNOSIS — F17200 Nicotine dependence, unspecified, uncomplicated: Secondary | ICD-10-CM

## 2017-07-24 MED ORDER — LINACLOTIDE 290 MCG PO CAPS: 290.0000 ug | ORAL_CAPSULE | Freq: Every day | ORAL | 1 refills | Status: DC

## 2017-07-24 MED ORDER — POLYETHYLENE GLYCOL 3350 17 GM/SCOOP PO POWD: ORAL | 1 refills | Status: DC

## 2017-07-24 MED ORDER — VARENICLINE TARTRATE 0.5 MG X 11 & 1 MG X 42 PO MISC: ORAL | 0 refills | Status: DC

## 2017-07-24 NOTE — Progress Notes (Signed)
Subjective:  Patient ID: Roy [name], male    DOB: 1971/04/14  Age: 46 y.o. MRN: [id number]  CC: Follow-up (pt here today for follow up after starting Dexilant )   HPI Roy [name] presents for Feeling much better. Still some occasional discomfort. He is still using the Linzess since we couldn't get the other approved. GI gave him a scope and said everything looked good. They couldn't get the Trulance approved either. He was advised to go back on the Linzess every day and add MiraLAX once a day. Patient is afraid of encopresis and has not been using the MiraLAX regularly. Patient wants to stop smoking. Requests Chantix prescription. He says he spending about $150 a month on cigarettes but he is concerned about the cost of the Chantix.  Depression screen [facility] 2/9 07/11/2017 07/07/2017 04/21/2017  Decreased Interest 0 0 0  Down, Depressed, Hopeless 0 0 0  PHQ - 2 Score 0 0 0  Altered sleeping - - -  Tired, decreased energy - - -  Change in appetite - - -  Feeling bad or failure about yourself  - - -  Trouble concentrating - - -  Moving slowly or fidgety/restless - - -  Suicidal thoughts - - -  PHQ-9 Score - - -  Difficult doing work/chores - - -    History Roy [name] has a past medical history of Anxiety; Depression; Median nerve dysfunction; Radiculopathy of cervical region; Ulnar neuropathy at elbow; and Weakness.   He has a past surgical history that includes Rotator cuff repair (Right); Biceps tendon repair; and Neck surgery.   His family history includes Arrhythmia in his father; Hypertension in his mother; Stroke in his mother.He reports that he has been smoking Cigarettes.  He has been smoking about 0.50 packs per day. He has quit using smokeless tobacco. His smokeless tobacco use included Snuff and Chew. He reports that he does not drink alcohol or use drugs.    ROS Review of Systems  Constitutional: Negative for chills, diaphoresis and fever.  HENT: Negative for  rhinorrhea and sore throat.   Respiratory: Negative for cough and shortness of breath.   Cardiovascular: Negative for chest pain.  Gastrointestinal: Positive for abdominal pain (heartburn present, but much better) and constipation.  Musculoskeletal: Negative for arthralgias and myalgias.  Skin: Negative for rash.  Neurological: Negative for weakness and headaches.    Objective:  BP 128/68   Pulse 61   Temp 98.6 F (37 C) (Oral)   Ht 5\' 8"  (1.727 m)   Wt (!) 342 lb (155.1 kg)   BMI 52.00 kg/m   BP Readings from Last 3 Encounters:  07/24/17 128/68  07/11/17 127/75  07/07/17 124/66    Wt Readings from Last 3 Encounters:  07/24/17 (!) 342 lb (155.1 kg)  07/11/17 (!) 338 lb (153.3 kg)  07/07/17 (!) 335 lb (152 kg)     Physical Exam  Constitutional: He appears well-developed and well-nourished.  HENT:  Head: Normocephalic and atraumatic.  Right Ear: Tympanic membrane and external ear normal. No decreased hearing is noted.  Left Ear: Tympanic membrane and external ear normal. No decreased hearing is noted.  Mouth/Throat: No oropharyngeal exudate or posterior oropharyngeal erythema.  Eyes: Pupils are equal, round, and reactive to light.  Neck: Normal range of motion. Neck supple.  Cardiovascular: Normal rate and regular rhythm.   No murmur heard. Pulmonary/Chest: Breath sounds normal. No respiratory distress.  Abdominal: Soft. Bowel sounds are normal. He exhibits no mass. There  is no tenderness.  Vitals reviewed.     Assessment & Plan:   Roy [name] was seen today for follow-up.  Diagnoses and all orders for this visit:  Tobacco dependence  Gastroesophageal reflux disease with esophagitis  Benign essential HTN  Other orders -     linaclotide (LINZESS) 290 MCG CAPS capsule; Take 1 capsule (290 mcg total) by mouth daily before breakfast. -     polyethylene glycol powder (GLYCOLAX/MIRALAX) powder; Take 1 capful daily. -     varenicline (CHANTIX PAK) 0.5 MG X 11 & 1  MG X 42 tablet; Take as directed.       I am having Roy [name] start on linaclotide, polyethylene glycol powder, and varenicline. I am also having him maintain his Fish Oil, fluticasone, aspirin EC, gabapentin, Testosterone, levothyroxine, gemfibrozil, acetaminophen, ALPRAZolam, carvedilol, albuterol, traZODone, SYMBICORT, DULoxetine, Plecanatide, and dexlansoprazole.  Allergies as of 07/24/2017      Reactions   Penicillins Rash   Has patient had a PCN reaction causing immediate rash, facial/tongue/throat swelling, SOB or lightheadedness with hypotension: Yes Has patient had a PCN reaction causing severe rash involving mucus membranes or skin necrosis: No Has patient had a PCN reaction that required hospitalization: No Has patient had a PCN reaction occurring within the last 10 years: No If all of the above answers are "NO", then may proceed with Cephalosporin use.      Medication List       Accurate as of 07/24/17  2:07 PM. Always use your most recent med list.          acetaminophen 500 MG tablet Commonly known as:  TYLENOL Take 1,000 mg by mouth every 6 (six) hours as needed for headache (pain).   albuterol 108 (90 Base) MCG/ACT inhaler Commonly known as:  PROVENTIL HFA;VENTOLIN HFA Inhale 1-2 puffs into the lungs every 6 (six) hours as needed for wheezing or shortness of breath.   ALPRAZolam 0.5 MG tablet Commonly known as:  XANAX TAKE ONE TABLET BY MOUTH THREE TIMES DAILY AS NEEDED FOR ANXIETY/PANIC   aspirin EC 81 MG tablet Take 81 mg by mouth daily.   carvedilol 12.5 MG tablet Commonly known as:  COREG TAKE 1 TABLET BY MOUTH TWICE DAILY WITH MEALS   dexlansoprazole 60 MG capsule Commonly known as:  DEXILANT Take 1 capsule (60 mg total) by mouth daily.   DULoxetine 60 MG capsule Commonly known as:  CYMBALTA TAKE 2 CAPSULES BY MOUTH ONCE DAILY   Fish Oil 1000 MG Caps Take 1,000 mg by mouth daily.   fluticasone 50 MCG/ACT nasal spray Commonly known as:   FLONASE Place 2 sprays into both nostrils daily.   gabapentin 300 MG capsule Commonly known as:  NEURONTIN Take 1 capsule (300 mg total) by mouth 3 (three) times daily.   gemfibrozil 600 MG tablet Commonly known as:  LOPID TAKE ONE TABLET BY MOUTH TWICE DAILY BEFORE A MEAL   levothyroxine 50 MCG tablet Commonly known as:  SYNTHROID, LEVOTHROID Take 1 tablet (50 mcg total) by mouth daily.   linaclotide 290 MCG Caps capsule Commonly known as:  LINZESS Take 1 capsule (290 mcg total) by mouth daily before breakfast.   Plecanatide 3 MG Tabs Commonly known as:  TRULANCE Take 3 mg by mouth daily.   polyethylene glycol powder powder Commonly known as:  GLYCOLAX/MIRALAX Take 1 capful daily.   SYMBICORT 160-4.5 MCG/ACT inhaler Generic drug:  budesonide-formoterol INHALE TWO PUFFS BY MOUTH TWICE DAILY FOR COPD/ASTHMA   Testosterone 20.25 MG/ACT (1.62%) Gel  Commonly known as:  ANDROGEL PUMP Apply 4 pumps daily to upper chest and shoulders   traZODone 100 MG tablet Commonly known as:  DESYREL TAKE 1 TABLET BY MOUTH AT BEDTIME   varenicline 0.5 MG X 11 & 1 MG X 42 tablet Commonly known as:  CHANTIX PAK Take as directed.      I encouraged the patient to use the MiraLAX at bedtime. If he has encopresis he can move it to morning. I'd rather him use it daily in the morning then not use it consistently. I encouraged him that most people either go the following morning or awaken so they can go to the bathroom without soiling themselves. With regard to smoking we discussed the financial aspect of purchasing a pack a day. This is costing him about $150 a month. I suggested him that even in the short run Chantix would probably cause less and certainly his savings would be huge in the long run getting a $150 a month after tax Net raise!  Follow-up: No Follow-up on file.  Mechele ClaudeWarren Earlie Arciga, M.D.

## 2017-08-19 ENCOUNTER — Other Ambulatory Visit: Payer: Self-pay | Admitting: Family Medicine

## 2017-08-19 ENCOUNTER — Telehealth: Payer: Self-pay | Admitting: Family Medicine

## 2017-08-19 MED ORDER — VARENICLINE TARTRATE 1 MG PO TABS: 1.0000 mg | ORAL_TABLET | Freq: Two times a day (BID) | ORAL | 5 refills | Status: DC

## 2017-08-19 NOTE — Telephone Encounter (Signed)
I sent in the requested prescription 

## 2017-08-19 NOTE — Telephone Encounter (Signed)
Patient notified that rx sent to pharmacy 

## 2017-09-05 ENCOUNTER — Other Ambulatory Visit: Payer: Self-pay | Admitting: Family Medicine

## 2017-09-08 DIAGNOSIS — J320 Chronic maxillary sinusitis: Secondary | ICD-10-CM | POA: Insufficient documentation

## 2017-09-08 DIAGNOSIS — R431 Parosmia: Secondary | ICD-10-CM | POA: Diagnosis not present

## 2017-09-08 DIAGNOSIS — J328 Other chronic sinusitis: Secondary | ICD-10-CM | POA: Diagnosis not present

## 2017-09-09 ENCOUNTER — Telehealth: Payer: Self-pay

## 2017-09-09 DIAGNOSIS — R079 Chest pain, unspecified: Secondary | ICD-10-CM | POA: Diagnosis not present

## 2017-09-09 DIAGNOSIS — I10 Essential (primary) hypertension: Secondary | ICD-10-CM | POA: Diagnosis not present

## 2017-09-09 NOTE — Telephone Encounter (Signed)
Patient reports he has been feeling nauseous, chest discomfort, and elevated blood pressure since yesterday.  Wanted to know what he should do.  I advised him to call 911 to have EMS out to evaluate him and transport if needed.

## 2017-09-10 ENCOUNTER — Encounter: Payer: Self-pay | Admitting: Pediatrics

## 2017-09-10 ENCOUNTER — Ambulatory Visit: Payer: Medicare HMO | Admitting: Pediatrics

## 2017-09-10 VITALS — BP 127/77 | HR 66 | Temp 97.2°F | Ht 68.0 in | Wt 346.0 lb

## 2017-09-10 DIAGNOSIS — I10 Essential (primary) hypertension: Secondary | ICD-10-CM | POA: Diagnosis not present

## 2017-09-10 NOTE — Progress Notes (Signed)
  Subjective:   Patient ID: Roy [name], male    DOB: 09/22/1971, 46 y.o.   MRN: [id number] CC: Hypertension (pt here today c/o elevated BP readings since Monday and is also having headaches. [facility] [address] last night and they [facility].)  HPI: Roy [name] is a 46 y.o. male presenting for Hypertension (pt here today c/o elevated BP readings since Monday and is also having headaches. [facility] on [address] last night and they [facility] which pt [facility].)  Blood pressure here today normal Patient brought BP cuff and machine from home with him Feeling fine now Has had some headaches off and on With seen at ENT on Monday, had sinus procedure done Blood pressure was elevated there as well up into the 180s over 100s No chest pain, no shortness of breath  Here today with his wife Has not missed any medications  Relevant past medical, surgical, family and social history reviewed. Allergies and medications reviewed and updated. Social History   Tobacco Use  Smoking Status Former Smoker  . Packs/day: 0.50  . Types: Cigarettes  . Last attempt to quit: 07/23/2017  . Years since quitting: 0.1  Smokeless Tobacco Former NeurosurgeonUser  . Types: Snuff, Chew   ROS: Per HPI   Objective:    BP 127/77   Pulse 66   Temp (!) 97.2 F (36.2 C) (Oral)   Ht 5\' 8"  (1.727 m)   Wt (!) 346 lb (156.9 kg)   BMI 52.61 kg/m   Wt Readings from Last 3 Encounters:  09/10/17 (!) 346 lb (156.9 kg)  07/24/17 (!) 342 lb (155.1 kg)  07/11/17 (!) 338 lb (153.3 kg)    Gen: NAD, alert, obese, cooperative with exam, NCAT EYES: EOMI, no conjunctival injection, or no icterus CV: NRRR, normal S1/S2, no murmur, distal pulses 2+ b/l Resp: CTABL, no wheezes, normal WOB Abd: +BS, soft, NTND.  Ext: No edema, warm Neuro: Alert and oriented, strength equal b/l UE and LE, coordination grossly normal MSK: normal  muscle bulk  Assessment & Plan:  Roy [name] was seen today for hypertension.  Diagnoses and all orders for this visit:  Essential hypertension Normal blood pressure today in clinic, repeated manually with appropriately sized cuff with similar readings to machine. Upper arm circumference right arm 45 cm Blood pressure cuff for use at home has maximum circumference of 42 cm Unknown if appropriately sized cuff was used when blood pressure was checked earlier this week at other offices but with normal blood pressure today, recommend patient continue current medicines, get appropriate cuff size for use at home, continue to check blood pressures at home.  Let us know if blood pressure regularly over 140 on top or over 90 on the bottom before next visit with PCP.  Total time spent with the patient was 15 minutes with greater than 50% of the time spent in face-to-face consultation discussing blood pressure monitoring as well as treatment going forward.   Follow up plan: As scheduled Rex Krasarol Vincent, MD [facility]

## 2017-09-10 NOTE — Patient Instructions (Signed)
Let us know if blood pressure over 140 on top, over 90 on bottom

## 2017-09-18 ENCOUNTER — Other Ambulatory Visit: Payer: Self-pay | Admitting: Family Medicine

## 2017-09-23 ENCOUNTER — Other Ambulatory Visit: Payer: Self-pay | Admitting: Family Medicine

## 2017-10-07 ENCOUNTER — Ambulatory Visit (INDEPENDENT_AMBULATORY_CARE_PROVIDER_SITE_OTHER): Payer: Medicare HMO | Admitting: Family Medicine

## 2017-10-07 ENCOUNTER — Encounter: Payer: Self-pay | Admitting: Family Medicine

## 2017-10-07 VITALS — BP 126/76 | HR 57 | Temp 98.0°F | Ht 68.0 in | Wt 338.0 lb

## 2017-10-07 DIAGNOSIS — E038 Other specified hypothyroidism: Secondary | ICD-10-CM | POA: Diagnosis not present

## 2017-10-07 DIAGNOSIS — K089 Disorder of teeth and supporting structures, unspecified: Secondary | ICD-10-CM | POA: Insufficient documentation

## 2017-10-07 DIAGNOSIS — J321 Chronic frontal sinusitis: Secondary | ICD-10-CM | POA: Diagnosis not present

## 2017-10-07 DIAGNOSIS — J320 Chronic maxillary sinusitis: Secondary | ICD-10-CM | POA: Diagnosis not present

## 2017-10-07 DIAGNOSIS — R431 Parosmia: Secondary | ICD-10-CM | POA: Diagnosis not present

## 2017-10-07 DIAGNOSIS — E349 Endocrine disorder, unspecified: Secondary | ICD-10-CM | POA: Diagnosis not present

## 2017-10-07 DIAGNOSIS — N529 Male erectile dysfunction, unspecified: Secondary | ICD-10-CM

## 2017-10-07 DIAGNOSIS — J342 Deviated nasal septum: Secondary | ICD-10-CM | POA: Diagnosis not present

## 2017-10-07 DIAGNOSIS — J328 Other chronic sinusitis: Secondary | ICD-10-CM | POA: Diagnosis not present

## 2017-10-07 MED ORDER — SILDENAFIL CITRATE 20 MG PO TABS: ORAL_TABLET | ORAL | 2 refills | Status: DC

## 2017-10-07 NOTE — Progress Notes (Signed)
Subjective:  Patient ID: Roy [name], male    DOB: 1970-11-29  Age: 47 y.o. MRN: [id number]  CC: Erectile Dysfunction   HPI Roy [name] presents for Follow up for testosterone deficiency: Pt. Using medication as directed. Denies any sx referrable to DVT such as edema or erythema of legs. No dyspnea or chest pain. Energy level reported as being good. Libido is normal . Feels strength is adequate and improved from baseline.  Several nights ago the patient was intimate with his wife but could not attain an erection.  This is unusual for him.  He is taking his testosterone as directed.  He would like to have treatment for his erectile dysfunction specifically because of his tendency to testosterone deficiency.  Due to his size it is difficult for him to get his testosterone back up in the normal range even with supplementation.  His most recent level was 248 in spite of supplementation.   Depression screen [facility] 2/9 07/11/2017 07/07/2017 04/21/2017  Decreased Interest 0 0 0  Down, Depressed, Hopeless 0 0 0  PHQ - 2 Score 0 0 0  Altered sleeping - - -  Tired, decreased energy - - -  Change in appetite - - -  Feeling bad or failure about yourself  - - -  Trouble concentrating - - -  Moving slowly or fidgety/restless - - -  Suicidal thoughts - - -  PHQ-9 Score - - -  Difficult doing work/chores - - -    History Deveron has a past medical history of Anxiety, Depression, Median nerve dysfunction, Radiculopathy of cervical region, Ulnar neuropathy at elbow, and Weakness.   He has a past surgical history that includes Rotator cuff repair (Right); Biceps tendon repair; and Neck surgery.   His family history includes Arrhythmia in his father; Hypertension in his mother; Stroke in his mother.He reports that he quit smoking about 2 months ago. His smoking use included cigarettes. He smoked 0.50 packs per day. He has quit using smokeless tobacco. His smokeless tobacco use included snuff and  chew. He reports that he does not drink alcohol or use drugs.    ROS Review of Systems  Constitutional: Negative for chills, diaphoresis and fever.  HENT: Negative for rhinorrhea and sore throat.   Respiratory: Negative for cough and shortness of breath.   Cardiovascular: Negative for chest pain.  Gastrointestinal: Negative for abdominal pain.  Genitourinary: Negative for dysuria, penile pain, penile swelling, scrotal swelling and testicular pain.  Musculoskeletal: Negative for arthralgias and myalgias.  Skin: Negative for rash.  Neurological: Negative for weakness and headaches.    Objective:  BP 126/76   Pulse (!) 57   Temp 98 F (36.7 C) (Oral)   Ht '5\' 8"'  (1.727 m)   Wt (!) 338 lb (153.3 kg)   BMI 51.39 kg/m   BP Readings from Last 3 Encounters:  10/07/17 126/76  09/10/17 127/77  07/24/17 128/68    Wt Readings from Last 3 Encounters:  10/07/17 (!) 338 lb (153.3 kg)  09/10/17 (!) 346 lb (156.9 kg)  07/24/17 (!) 342 lb (155.1 kg)     Physical Exam  Constitutional: He is oriented to person, place, and time. He appears well-developed and well-nourished.  HENT:  Head: Normocephalic and atraumatic.  Right Ear: External ear normal.  Left Ear: External ear normal.  Mouth/Throat: No oropharyngeal exudate or posterior oropharyngeal erythema.  Eyes: Pupils are equal, round, and reactive to light.  Neck: Normal range of motion. Neck supple.  Cardiovascular: Normal rate and regular rhythm.  No murmur heard. Pulmonary/Chest: Breath sounds normal. No respiratory distress.  Neurological: He is alert and oriented to person, place, and time.  Vitals reviewed.     Assessment & Plan:   Roy [name] was seen today for erectile dysfunction.  Diagnoses and all orders for this visit:  Other specified hypothyroidism -     CBC with Differential/Platelet -     CMP14+EGFR -     TSH -     T4, Free  Erectile dysfunction, unspecified erectile dysfunction type -      Testosterone,Free and Total  Hypotestosteronism  Morbid obesity ([place])  Other orders -     sildenafil (REVATIO) 20 MG tablet; Take 2-5 tablets daily as needed for sex       I have discontinued Rohan Juenger. [place]'s amoxicillin-clavulanate. I am also having him start on sildenafil. Additionally, I am having him maintain his Fish Oil, fluticasone, aspirin EC, Testosterone, levothyroxine, gemfibrozil, acetaminophen, albuterol, traZODone, SYMBICORT, DULoxetine, dexlansoprazole, linaclotide, polyethylene glycol powder, carvedilol, gabapentin, and ALPRAZolam.  Allergies as of 10/07/2017      Reactions   Chantix [varenicline] Other (See Comments)   Severe headache   Penicillins Rash   Has patient had a PCN reaction causing immediate rash, facial/tongue/throat swelling, SOB or lightheadedness with hypotension: Yes Has patient had a PCN reaction causing severe rash involving mucus membranes or skin necrosis: No Has patient had a PCN reaction that required hospitalization: No Has patient had a PCN reaction occurring within the last 10 years: No If all of the above answers are "NO", then may proceed with Cephalosporin use.      Medication List        Accurate as of 10/07/17  3:46 PM. Always use your most recent med list.          acetaminophen 500 MG tablet Commonly known as:  TYLENOL Take 1,000 mg by mouth every 6 (six) hours as needed for headache (pain).   albuterol 108 (90 Base) MCG/ACT inhaler Commonly known as:  PROVENTIL HFA;VENTOLIN HFA Inhale 1-2 puffs into the lungs every 6 (six) hours as needed for wheezing or shortness of breath.   ALPRAZolam 0.5 MG tablet Commonly known as:  XANAX TAKE 1 TABLET BY MOUTH THREE TIMES DAILY AS NEEDED FOR  ANXIETY/PANIC   aspirin EC 81 MG tablet Take 81 mg by mouth daily.   carvedilol 12.5 MG tablet Commonly known as:  COREG TAKE 1 TABLET BY MOUTH TWICE DAILY WITH MEALS   dexlansoprazole 60 MG capsule Commonly known as:  DEXILANT Take  1 capsule (60 mg total) by mouth daily.   DULoxetine 60 MG capsule Commonly known as:  CYMBALTA TAKE 2 CAPSULES BY MOUTH ONCE DAILY   Fish Oil 1000 MG Caps Take 1,000 mg by mouth daily.   fluticasone 50 MCG/ACT nasal spray Commonly known as:  FLONASE Place 2 sprays into both nostrils daily.   gabapentin 300 MG capsule Commonly known as:  NEURONTIN TAKE 1 CAPSULE BY MOUTH THREE TIMES DAILY   gemfibrozil 600 MG tablet Commonly known as:  LOPID TAKE ONE TABLET BY MOUTH TWICE DAILY BEFORE A MEAL   levothyroxine 50 MCG tablet Commonly known as:  SYNTHROID, LEVOTHROID Take 1 tablet (50 mcg total) by mouth daily.   linaclotide 290 MCG Caps capsule Commonly known as:  LINZESS Take 1 capsule (290 mcg total) by mouth daily before breakfast.   polyethylene glycol powder powder Commonly known as:  GLYCOLAX/MIRALAX Take 1 capful daily.  sildenafil 20 MG tablet Commonly known as:  REVATIO Take 2-5 tablets daily as needed for sex   SYMBICORT 160-4.5 MCG/ACT inhaler Generic drug:  budesonide-formoterol INHALE TWO PUFFS BY MOUTH TWICE DAILY FOR COPD/ASTHMA   Testosterone 20.25 MG/ACT (1.62%) Gel Commonly known as:  ANDROGEL PUMP Apply 4 pumps daily to upper chest and shoulders   traZODone 100 MG tablet Commonly known as:  DESYREL TAKE 1 TABLET BY MOUTH AT BEDTIME      Patient was also advised that the problem remains his obesity.  Testosterone and Viagra may well counteract his erectile dysfunction due to obesity.  However, he was advised to pursue weight loss as the primary target of his treatment.  Several strategies were reviewed with him.  Follow-up: Return in about 2 months (around 12/05/2017).  Claretta Fraise, M.D.

## 2017-10-08 LAB — CMP14+EGFR
ALBUMIN: 4.9 g/dL (ref 3.5–5.5)
ALT: 21 IU/L (ref 0–44)
AST: 20 IU/L (ref 0–40)
Albumin/Globulin Ratio: 2 (ref 1.2–2.2)
Alkaline Phosphatase: 66 IU/L (ref 39–117)
BUN / CREAT RATIO: 13 (ref 9–20)
BUN: 13 mg/dL (ref 6–24)
Bilirubin Total: 0.3 mg/dL (ref 0.0–1.2)
CO2: 22 mmol/L (ref 20–29)
CREATININE: 0.99 mg/dL (ref 0.76–1.27)
Calcium: 9.9 mg/dL (ref 8.7–10.2)
Chloride: 97 mmol/L (ref 96–106)
GFR calc non Af Amer: 92 mL/min/{1.73_m2} (ref >59)
GFR, EST AFRICAN AMERICAN: 107 mL/min/{1.73_m2} (ref >59)
Globulin, Total: 2.5 g/dL (ref 1.5–4.5)
Glucose: 83 mg/dL (ref 65–99)
Potassium: 4.4 mmol/L (ref 3.5–5.2)
Sodium: 138 mmol/L (ref 134–144)
TOTAL PROTEIN: 7.4 g/dL (ref 6.0–8.5)

## 2017-10-08 LAB — CBC WITH DIFFERENTIAL/PLATELET
Basophils Absolute: 0 10*3/uL (ref 0.0–0.2)
Basos: 1 %
EOS (ABSOLUTE): 0.1 10*3/uL (ref 0.0–0.4)
Eos: 2 %
Hematocrit: 38.1 % (ref 37.5–51.0)
Hemoglobin: 13.1 g/dL (ref 13.0–17.7)
Immature Grans (Abs): 0 10*3/uL (ref 0.0–0.1)
Immature Granulocytes: 0 %
Lymphocytes Absolute: 2.3 10*3/uL (ref 0.7–3.1)
Lymphs: 37 %
MCH: 31.6 pg (ref 26.6–33.0)
MCHC: 34.4 g/dL (ref 31.5–35.7)
MCV: 92 fL (ref 79–97)
Monocytes Absolute: 0.3 10*3/uL (ref 0.1–0.9)
Monocytes: 5 %
Neutrophils Absolute: 3.4 10*3/uL (ref 1.4–7.0)
Neutrophils: 55 %
Platelets: 284 10*3/uL (ref 150–379)
RBC: 4.14 x10E6/uL (ref 4.14–5.80)
RDW: 14.1 % (ref 12.3–15.4)
WBC: 6.2 10*3/uL (ref 3.4–10.8)

## 2017-10-08 LAB — TESTOSTERONE,FREE AND TOTAL
Testosterone, Free: 10.5 pg/mL (ref 6.8–21.5)
Testosterone: 266 ng/dL (ref 264–916)

## 2017-10-08 LAB — T4, FREE: Free T4: 1.24 ng/dL (ref 0.82–1.77)

## 2017-10-08 LAB — TSH: TSH: 2.14 u[IU]/mL (ref 0.450–4.500)

## 2017-10-09 ENCOUNTER — Other Ambulatory Visit: Payer: Self-pay | Admitting: Family Medicine

## 2017-10-09 DIAGNOSIS — M5416 Radiculopathy, lumbar region: Secondary | ICD-10-CM

## 2017-10-15 ENCOUNTER — Telehealth: Payer: Self-pay | Admitting: Family Medicine

## 2017-10-15 ENCOUNTER — Other Ambulatory Visit: Payer: Self-pay | Admitting: Family Medicine

## 2017-10-15 MED ORDER — SILDENAFIL CITRATE 20 MG PO TABS: ORAL_TABLET | ORAL | 1 refills | Status: DC

## 2017-10-15 NOTE — Telephone Encounter (Signed)
Prescription printed.  It will be on my desk.

## 2017-10-15 NOTE — Telephone Encounter (Signed)
Left detailed message on patients machine that rx up front and ready for pick up

## 2017-10-15 NOTE — Telephone Encounter (Signed)
Please advise and route to Pool [facility] 

## 2017-10-20 ENCOUNTER — Emergency Department (HOSPITAL_COMMUNITY)
Admission: EM | Admit: 2017-10-20 | Discharge: 2017-10-20 | Disposition: A | Discharge status: Home / Self Care | Payer: Medicare HMO | Attending: Emergency Medicine | Admitting: Emergency Medicine

## 2017-10-20 ENCOUNTER — Emergency Department (HOSPITAL_COMMUNITY): Payer: Medicare HMO

## 2017-10-20 ENCOUNTER — Encounter (HOSPITAL_COMMUNITY): Payer: Self-pay

## 2017-10-20 DIAGNOSIS — Z7982 Long term (current) use of aspirin: Secondary | ICD-10-CM | POA: Diagnosis not present

## 2017-10-20 DIAGNOSIS — Z79899 Other long term (current) drug therapy: Secondary | ICD-10-CM | POA: Diagnosis not present

## 2017-10-20 DIAGNOSIS — I10 Essential (primary) hypertension: Secondary | ICD-10-CM | POA: Diagnosis not present

## 2017-10-20 DIAGNOSIS — R0602 Shortness of breath: Secondary | ICD-10-CM | POA: Diagnosis not present

## 2017-10-20 DIAGNOSIS — R0789 Other chest pain: Secondary | ICD-10-CM | POA: Diagnosis not present

## 2017-10-20 DIAGNOSIS — R079 Chest pain, unspecified: Secondary | ICD-10-CM | POA: Diagnosis not present

## 2017-10-20 DIAGNOSIS — E039 Hypothyroidism, unspecified: Secondary | ICD-10-CM | POA: Diagnosis not present

## 2017-10-20 DIAGNOSIS — Z87891 Personal history of nicotine dependence: Secondary | ICD-10-CM | POA: Diagnosis not present

## 2017-10-20 LAB — I-STAT TROPONIN, ED
TROPONIN I, POC: 0 ng/mL (ref 0.00–0.08)
Troponin i, poc: 0 ng/mL (ref 0.00–0.08)

## 2017-10-20 LAB — CBC
HEMATOCRIT: 38.7 % — AB (ref 39.0–52.0)
Hemoglobin: 13.3 g/dL (ref 13.0–17.0)
MCH: 31.7 pg (ref 26.0–34.0)
MCHC: 34.4 g/dL (ref 30.0–36.0)
MCV: 92.4 fL (ref 78.0–100.0)
PLATELETS: 234 10*3/uL (ref 150–400)
RBC: 4.19 MIL/uL — AB (ref 4.22–5.81)
RDW: 13.5 % (ref 11.5–15.5)
WBC: 5.3 10*3/uL (ref 4.0–10.5)

## 2017-10-20 LAB — BASIC METABOLIC PANEL
Anion gap: 12 (ref 5–15)
BUN: 11 mg/dL (ref 6–20)
CO2: 21 mmol/L — ABNORMAL LOW (ref 22–32)
Calcium: 9.6 mg/dL (ref 8.9–10.3)
Chloride: 102 mmol/L (ref 101–111)
Creatinine, Ser: 0.96 mg/dL (ref 0.61–1.24)
Glucose, Bld: 102 mg/dL — ABNORMAL HIGH (ref 65–99)
POTASSIUM: 4 mmol/L (ref 3.5–5.1)
Sodium: 135 mmol/L (ref 135–145)

## 2017-10-20 LAB — D-DIMER, QUANTITATIVE (NOT AT ARMC): D DIMER QUANT: 0.29 ug{FEU}/mL (ref 0.00–0.50)

## 2017-10-20 MED ORDER — GI COCKTAIL ~~LOC~~
30.0000 mL | Freq: Once | ORAL | Status: AC
  Administered 2017-10-20: 30 mL via ORAL
  Filled 2017-10-20: qty 30

## 2017-10-20 NOTE — ED Triage Notes (Signed)
Patient complains of chest pain and SOB and with radiation to left shoulder since walking on treadmill yesterday. Patient speaking full sentences on arrival, NAD

## 2017-10-20 NOTE — ED Provider Notes (Signed)
[facility] EMERGENCY DEPARTMENT Provider Note   CSN: [id number] Arrival date & time: 10/20/17  7425     History   Chief Complaint No chief complaint on file.   HPI Roy [name] is a 47 y.o. male.  47 year old male with prior history of anxiety and depression presents with complaint of atypical chest discomfort.  Patient reports that he has been exercising more on a treadmill over the last 2-3 weeks.  He reports that yesterday, after an episode of exercise, he felt lightheaded and weak.  He did not pass out.  He started having vague chest discomfort at that time.  This chest discomfort has been persistent for the last 24 hours.  He denies associated nausea, vomiting, shortness of breath, diaphoresis, or other acute complaint.  He does not appear to be in distress at this time.  Of note, he reports a normal stress test within the last 1.5 years.    The history is provided by the patient.  Chest Pain   This is a new problem. The current episode started 12 to 24 hours ago. The problem occurs constantly. The problem has not changed since onset.Associated with: no association with movement or exertion. Pain location: diffuse anterior chest  The pain is mild. The quality of the pain is described as dull. The pain does not radiate. Duration of episode(s) is 24 hours. Exacerbated by: constant. Pertinent negatives include no abdominal pain, no back pain, no dizziness, no fever, no headaches, no leg pain, no malaise/fatigue, no nausea, no sputum production, no syncope, no vomiting and no weakness. He has tried nothing for the symptoms. The treatment provided no relief. There are no known risk factors.    Past Medical History:  Diagnosis Date  . Anxiety   . Depression   . Median nerve dysfunction   . Radiculopathy of cervical region   . Ulnar neuropathy at elbow   . Weakness     Patient Active Problem List   Diagnosis Date Noted  . Headache 01/22/2016  . Nicotine  abuse 12/21/2015  . Benign essential HTN 12/21/2015  . Hypothyroid 12/21/2015  . Morbid obesity (HCC) 10/19/2015  . 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 Lumbar radiculopathy 08/11/2013  . 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 Hypotestosteronism 03/29/2013    Past Surgical History:  Procedure Laterality Date  . BICEPS TENDON REPAIR    . NECK SURGERY     Fusion done two seperate times  . ROTATOR CUFF REPAIR Right        Home Medications    Prior to Admission medications   Medication Sig Start Date End Date Taking? Authorizing Provider  acetaminophen (TYLENOL) 500 MG tablet Take 1,000 mg by mouth every 6 (six) hours as needed for headache (pain).     [provider]  albuterol (PROVENTIL HFA;VENTOLIN HFA) 108 (90 Base) MCG/ACT inhaler Inhale 1-2 puffs into the lungs every 6 (six) hours as needed for wheezing or shortness of breath. 07/05/17   Loren Racer, MD  ALPRAZolam Prudy Feeler) 0.5 MG tablet TAKE 1 TABLET BY MOUTH THREE TIMES DAILY AS NEEDED FOR  ANXIETY/PANIC 09/24/17   Mechele Claude, MD  aspirin EC 81 MG tablet Take 81 mg by mouth daily.    [provider]  carvedilol (COREG) 12.5 MG tablet TAKE 1 TABLET BY MOUTH TWICE DAILY WITH MEALS 09/08/17   Mechele Claude, MD  dexlansoprazole (DEXILANT) 60 MG capsule Take 1 capsule (60 mg total) by mouth daily. 07/11/17   Mechele Claude, MD  DULoxetine (CYMBALTA) 60 MG capsule TAKE 2 CAPSULES BY MOUTH ONCE DAILY 10/10/17   Mechele ClaudeStacks, Warren, MD  fluticasone [facility](FLONASE) 50 MCG/ACT nasal spray Place 2 sprays into both nostrils daily. Patient taking differently: Place 2 sprays into both nostrils daily as needed (seasonal allergies).  06/24/16   Elenora GammaBradshaw, Samuel L, MD  gabapentin (NEURONTIN) 300 MG capsule TAKE 1 CAPSULE BY MOUTH THREE TIMES DAILY 09/18/17   Mechele ClaudeStacks, Warren, MD  gemfibrozil (LOPID) 600 MG tablet TAKE ONE TABLET BY MOUTH TWICE DAILY BEFORE A MEAL Patient taking differently: Take 600 mg by mouth 2 (two) times daily.  01/30/17    Mechele ClaudeStacks, Warren, MD  levothyroxine (SYNTHROID, LEVOTHROID) 50 MCG tablet Take 1 tablet (50 mcg total) by mouth daily. 01/20/17   Mechele ClaudeStacks, Warren, MD  linaclotide Karlene Einstein(LINZESS) 290 MCG CAPS capsule Take 1 capsule (290 mcg total) by mouth daily before breakfast. 07/24/17   Mechele ClaudeStacks, Warren, MD  Omega-3 Fatty Acids (FISH OIL) 1000 MG CAPS Take 1,000 mg by mouth daily.     [provider]  polyethylene glycol powder (GLYCOLAX/MIRALAX) powder Take 1 capful daily. 07/24/17   Mechele ClaudeStacks, Warren, MD  sildenafil (REVATIO) 20 MG tablet Take 2-5 tablets daily as needed for sex 10/15/17   Mechele ClaudeStacks, Warren, MD  SYMBICORT 160-4.5 MCG/ACT inhaler INHALE TWO PUFFS BY MOUTH TWICE DAILY FOR COPD/ASTHMA 07/08/17   Mechele ClaudeStacks, Warren, MD  Testosterone (ANDROGEL PUMP) 20.25 MG/ACT (1.62%) GEL Apply 4 pumps daily to upper chest and shoulders Patient taking differently: Place 81 mg onto the skin See admin instructions. Apply 4 pumps (81 mg) daily to upper chest and shoulders 01/20/17   Mechele ClaudeStacks, Warren, MD  traZODone (DESYREL) 100 MG tablet TAKE 1 TABLET BY MOUTH AT BEDTIME 07/09/17   Mechele ClaudeStacks, Warren, MD    Family History Family History  Problem Relation Age of Onset  . Hypertension Mother   . Stroke Mother   . Arrhythmia Father     Social History Social History   Tobacco Use  . Smoking status: Former Smoker    Packs/day: 0.50    Types: Cigarettes    Last attempt to quit: 07/23/2017    Years since quitting: 0.2  . Smokeless tobacco: Former NeurosurgeonUser    Types: Snuff, Chew  Substance Use Topics  . Alcohol use: No  . Drug use: No     Allergies   Chantix [varenicline] and Penicillins   Review of Systems Review of Systems  Constitutional: Negative for fever and malaise/fatigue.  Respiratory: Negative for sputum production.   Cardiovascular: Positive for chest pain. Negative for syncope.  Gastrointestinal: Negative for abdominal pain, nausea and vomiting.  Musculoskeletal: Negative for back pain.  Neurological:  Negative for dizziness, weakness and headaches.  All other systems reviewed and are negative.    Physical Exam Updated Vital Signs BP 116/75   Pulse (!) 50   Temp (!) 97.5 F (36.4 C) (Oral)   Resp 16   SpO2 97%   Physical Exam  Constitutional: He is oriented to person, place, and time. He appears well-developed and well-nourished. No distress.  HENT:  Head: Normocephalic and atraumatic.  Mouth/Throat: Oropharynx is clear and moist.  Eyes: Conjunctivae and EOM are normal. Pupils are equal, round, and reactive to light.  Neck: Normal range of motion. Neck supple.  Cardiovascular: Normal rate, regular rhythm and normal heart sounds.  Pulmonary/Chest: Effort normal and breath sounds normal. No respiratory distress.  Abdominal: Soft. He exhibits no distension. There is no tenderness.  Musculoskeletal: Normal range of motion. He exhibits no  edema or deformity.  Neurological: He is alert and oriented to person, place, and time.  Skin: Skin is warm and dry.  Psychiatric: He has a normal mood and affect.  Nursing note and vitals reviewed.    ED Treatments / Results  Labs (all labs ordered are listed, but only abnormal results are displayed) Labs Reviewed  BASIC METABOLIC PANEL - Abnormal; Notable for the following components:      Result Value   CO2 21 (*)    Glucose, Bld 102 (*)    All other components within normal limits  CBC - Abnormal; Notable for the following components:   RBC 4.19 (*)    HCT 38.7 (*)    All other components within normal limits  D-DIMER, QUANTITATIVE (NOT AT [facility])  I-STAT TROPONIN, ED  I-STAT TROPONIN, ED    EKG  EKG Interpretation  Date/Time:  Monday October 20 2017 08:34:25 EST Ventricular Rate:  48 PR Interval:  150 QRS Duration: 84 QT Interval:  430 QTC Calculation: 384 R Axis:   4 Text Interpretation:  Sinus bradycardia with Premature ventricular complexes or Fusion complexes Otherwise normal ECG Confirmed by Kristine Royal [phone]) on  10/20/2017 10:48:25 AM       Radiology Dg Chest 2 View  Result Date: 10/20/2017 CLINICAL DATA:  Chest pain and shortness of breath. EXAM: CHEST  2 VIEW COMPARISON:  07/07/2017 FINDINGS: The heart size and mediastinal contours are within normal limits. There is no evidence of pulmonary edema, consolidation, pneumothorax, nodule or pleural fluid. The visualized skeletal structures are unremarkable. IMPRESSION: No active cardiopulmonary disease. Electronically Signed   By: Irish Lack M.D.   On: 10/20/2017 09:12    Procedures Procedures (including critical care time)  Medications Ordered in ED Medications  gi cocktail (Maalox,Lidocaine,Donnatal) (30 mLs Oral Given 10/20/17 1154)     Initial Impression / Assessment and Plan / ED Course  I have reviewed the triage vital signs and the nursing notes.  Pertinent labs & imaging results that were available during my care of the patient were reviewed by me and considered in my medical decision making (see chart for details).     MDM screen complete    Patient is presenting with atypical chest pain.  Patient reports that his symptoms have been ongoing and continuous for the last 24 hours.  EKG is without findings suggesting ischemia and troponin x2 is negative.  D-dimer is negative.  Chest x-ray does not suggest acute pathology.  Patient feels improved following his ED evaluation.  He desires discharge home.  Close follow-up is advised.  Patient reports that he has an appointment on Friday of this week with his regular primary care provider.  Strict return precautions given and understood.   Final Clinical Impressions(s) / ED Diagnoses   Final diagnoses:  Chest pain, unspecified type    ED Discharge Orders    None       Wynetta Fines, MD 10/20/17 1341

## 2017-10-20 NOTE — ED Notes (Signed)
Patient verbalized understanding of discharge instructions and denies any further needs or questions at this time. VS stable. Patient ambulatory with steady gait.  

## 2017-10-21 ENCOUNTER — Encounter: Payer: Self-pay | Admitting: Family Medicine

## 2017-10-21 ENCOUNTER — Ambulatory Visit (INDEPENDENT_AMBULATORY_CARE_PROVIDER_SITE_OTHER): Payer: Medicare HMO | Admitting: Family Medicine

## 2017-10-21 VITALS — BP 119/69 | HR 47 | Ht 68.0 in | Wt 330.0 lb

## 2017-10-21 DIAGNOSIS — I10 Essential (primary) hypertension: Secondary | ICD-10-CM | POA: Diagnosis not present

## 2017-10-21 DIAGNOSIS — R0789 Other chest pain: Secondary | ICD-10-CM | POA: Diagnosis not present

## 2017-10-21 MED ORDER — CARVEDILOL 12.5 MG PO TABS: 6.2500 mg | ORAL_TABLET | Freq: Two times a day (BID) | ORAL | 0 refills | Status: DC

## 2017-10-21 NOTE — Progress Notes (Signed)
Subjective:  Patient ID: Roy [name], male    DOB: 1971-06-08  Age: 47 y.o. MRN: [id number]  CC: Hypertension (pt here today for routine follow up of his chronic medical conditions and also went to ED last night for chest pain )   HPI Roy [name] presents for recurrent episodes of chest pain.  It is substernal without radiation.  It is not accompanied by dyspnea.  There is no diaphoresis or nausea.  It can go on for several days to hours.  It is frequently more of a sharp pain or vague dull ache rather than pressure.   Follow-up of hypertension. Patient has no history of headache chest pain or shortness of breath or recent cough. Patient also denies symptoms of TIA such as numbness weakness lateralizing. Patient checks  blood pressure at home and has not had any elevated readings recently. Patient denies side effects from his medication. States taking it regularly.  Depression screen [facility] 2/9 07/11/2017 07/07/2017 04/21/2017  Decreased Interest 0 0 0  Down, Depressed, Hopeless 0 0 0  PHQ - 2 Score 0 0 0  Altered sleeping - - -  Tired, decreased energy - - -  Change in appetite - - -  Feeling bad or failure about yourself  - - -  Trouble concentrating - - -  Moving slowly or fidgety/restless - - -  Suicidal thoughts - - -  PHQ-9 Score - - -  Difficult doing work/chores - - -    History Roy [name] has a past medical history of Anxiety, Depression, Median nerve dysfunction, Radiculopathy of cervical region, Ulnar neuropathy at elbow, and Weakness.   He has a past surgical history that includes Rotator cuff repair (Right); Biceps tendon repair; and Neck surgery.   His family history includes Arrhythmia in his father; Hypertension in his mother; Stroke in his mother.He reports that he quit smoking about 2 months ago. His smoking use included cigarettes. He smoked 0.50 packs per day. He has quit using smokeless tobacco. His smokeless tobacco use included snuff and chew. He reports that  he does not drink alcohol or use drugs.    ROS Review of Systems  Constitutional: Negative for chills, diaphoresis, fever and unexpected weight change.  HENT: Negative for congestion, hearing loss, rhinorrhea and sore throat.   Eyes: Negative for visual disturbance.  Respiratory: Positive for chest tightness. Negative for cough and shortness of breath.   Cardiovascular: Positive for chest pain.  Gastrointestinal: Negative for abdominal pain, constipation and diarrhea.  Genitourinary: Negative for dysuria and flank pain.  Musculoskeletal: Negative for arthralgias and joint swelling.  Skin: Negative for rash.  Neurological: Negative for dizziness and headaches.  Psychiatric/Behavioral: Negative for dysphoric mood and sleep disturbance.    Objective:  BP 119/69   Pulse (!) 47   Ht 5\' 8"  (1.727 m)   Wt (!) 330 lb (149.7 kg)   BMI 50.18 kg/m   BP Readings from Last 3 Encounters:  10/21/17 119/69  10/20/17 116/75  10/07/17 126/76    Wt Readings from Last 3 Encounters:  10/21/17 (!) 330 lb (149.7 kg)  10/07/17 (!) 338 lb (153.3 kg)  09/10/17 (!) 346 lb (156.9 kg)     Physical Exam  Constitutional: He is oriented to person, place, and time. He appears well-developed and well-nourished. No distress.  HENT:  Head: Normocephalic and atraumatic.  Right Ear: External ear normal.  Left Ear: External ear normal.  Nose: Nose normal.  Mouth/Throat: Oropharynx is clear and moist.  Eyes: Conjunctivae and EOM are normal. Pupils are equal, round, and reactive to light.  Neck: Normal range of motion. Neck supple. No thyromegaly present.  Cardiovascular: Normal rate, regular rhythm and normal heart sounds.  No murmur heard. Pulmonary/Chest: Effort normal and breath sounds normal. No respiratory distress. He has no wheezes. He has no rales.  Abdominal: Soft. Bowel sounds are normal. He exhibits no distension. There is no tenderness.  Lymphadenopathy:    He has no cervical adenopathy.    Neurological: He is alert and oriented to person, place, and time. He has normal reflexes.  Skin: Skin is warm and dry.  Psychiatric: He has a normal mood and affect. His behavior is normal. Judgment and thought content normal.      Assessment & Plan:   Roy [name] was seen today for hypertension.  Diagnoses and all orders for this visit:  Essential hypertension  Other chest pain  Morbid obesity (HCC)  Other orders -     carvedilol (COREG) 12.5 MG tablet; Take 0.5 tablets (6.25 mg total) by mouth 2 (two) times daily with a meal.   Carvedilol will be decreased due to concern for bradycardia.  Continue other meds as is.  Will consider non-chronotropic medication for blood pressure should it rebound after decreasing the carvedilol.    I have changed Roy [name]'s carvedilol. I am also having him maintain his Fish Oil, fluticasone, aspirin EC, Testosterone, levothyroxine, gemfibrozil, acetaminophen, albuterol, traZODone, SYMBICORT, dexlansoprazole, linaclotide, polyethylene glycol powder, gabapentin, ALPRAZolam, DULoxetine, sildenafil, and CHANTIX CONTINUING MONTH PAK.  Allergies as of 10/21/2017      Reactions   Chantix [varenicline] Other (See Comments)   Severe headache   Penicillins Rash   Has patient had a PCN reaction causing immediate rash, facial/tongue/throat swelling, SOB or lightheadedness with hypotension: Yes Has patient had a PCN reaction causing severe rash involving mucus membranes or skin necrosis: No Has patient had a PCN reaction that required hospitalization: No Has patient had a PCN reaction occurring within the last 10 years: No If all of the above answers are "NO", then may proceed with Cephalosporin use.      Medication List        Accurate as of 10/21/17  9:23 PM. Always use your most recent med list.          acetaminophen 500 MG tablet Commonly known as:  TYLENOL Take 1,000 mg by mouth every 6 (six) hours as needed for headache (pain).    albuterol 108 (90 Base) MCG/ACT inhaler Commonly known as:  PROVENTIL HFA;VENTOLIN HFA Inhale 1-2 puffs into the lungs every 6 (six) hours as needed for wheezing or shortness of breath.   ALPRAZolam 0.5 MG tablet Commonly known as:  XANAX TAKE 1 TABLET BY MOUTH THREE TIMES DAILY AS NEEDED FOR  ANXIETY/PANIC   aspirin EC 81 MG tablet Take 81 mg by mouth daily.   carvedilol 12.5 MG tablet Commonly known as:  COREG Take 0.5 tablets (6.25 mg total) by mouth 2 (two) times daily with a meal.   CHANTIX CONTINUING MONTH PAK 1 MG tablet Generic drug:  varenicline   dexlansoprazole 60 MG capsule Commonly known as:  DEXILANT Take 1 capsule (60 mg total) by mouth daily.   DULoxetine 60 MG capsule Commonly known as:  CYMBALTA TAKE 2 CAPSULES BY MOUTH ONCE DAILY   Fish Oil 1000 MG Caps Take 1,000 mg by mouth daily.   fluticasone 50 MCG/ACT nasal spray Commonly known as:  FLONASE Place 2 sprays into both nostrils  daily.   gabapentin 300 MG capsule Commonly known as:  NEURONTIN TAKE 1 CAPSULE BY MOUTH THREE TIMES DAILY   gemfibrozil 600 MG tablet Commonly known as:  LOPID TAKE ONE TABLET BY MOUTH TWICE DAILY BEFORE A MEAL   levothyroxine 50 MCG tablet Commonly known as:  SYNTHROID, LEVOTHROID Take 1 tablet (50 mcg total) by mouth daily.   linaclotide 290 MCG Caps capsule Commonly known as:  LINZESS Take 1 capsule (290 mcg total) by mouth daily before breakfast.   polyethylene glycol powder powder Commonly known as:  GLYCOLAX/MIRALAX Take 1 capful daily.   sildenafil 20 MG tablet Commonly known as:  REVATIO Take 2-5 tablets daily as needed for sex   SYMBICORT 160-4.5 MCG/ACT inhaler Generic drug:  budesonide-formoterol INHALE TWO PUFFS BY MOUTH TWICE DAILY FOR COPD/ASTHMA   Testosterone 20.25 MG/ACT (1.62%) Gel Commonly known as:  ANDROGEL PUMP Apply 4 pumps daily to upper chest and shoulders   traZODone 100 MG tablet Commonly known as:  DESYREL TAKE 1 TABLET BY  MOUTH AT BEDTIME        Follow-up: No Follow-up on file.  Mechele Claude, M.D.

## 2017-10-21 NOTE — Patient Instructions (Signed)
Decrease carvedilol to 1/2 in the morning and 1 pill in the evening for three days. Then decrease to one half pill twice a day until you follow up with me in the office.

## 2017-10-22 ENCOUNTER — Ambulatory Visit (INDEPENDENT_AMBULATORY_CARE_PROVIDER_SITE_OTHER): Payer: Medicare HMO | Admitting: *Deleted

## 2017-10-22 VITALS — BP 140/84 | HR 49 | Ht 68.0 in | Wt 329.0 lb

## 2017-10-22 DIAGNOSIS — Z0000 Encounter for general adult medical examination without abnormal findings: Secondary | ICD-10-CM

## 2017-10-22 NOTE — Progress Notes (Signed)
Subjective:   Roy [name] is a 47 y.o. male who presents for an Initial Medicare Annual Wellness Visit.  Roy [name] worked in a variety of different jobs before going out on disability in 2013. 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  He enjoys fishing and watching television.  He lives with his wife, mother, and 45 year old daughter.  Roy [name] attends [employer].  He feels his health is about the same as it was last year.  He has had no surgeries or hospitalizations in the past year, but has been to the ED 3 times.    Roy [name] had been walking on his treadmill for 30 minutes per day for the past 3 weeks.  On 10/19/17 he was walking on his treadmill and got dizzy, had chest pain and pain in the left side of his neck.  He continued to have the chest discomfort and went to the ED for evaluation on 10/20/17.  All cardiac tests were negative.  He followed up with Dr. Darlyn ReadStacks yesterday.  He states that he has a long history of anxiety and wonders if his symptoms could be related to that, but he is still very concerned that his chest discomfort has not completely subsided.  He rates the discomfort as a 1-2 today.  He does feel that it improved over the course of our visit, and states that he also feels better when his wife is at home.  He is concerned about returning to walking on the treadmill.   Review of Systems  Patient reports a headache today pain mostly near temples Chest discomfort rate at a 1, he has been evaluated for this at ED 10/19/17 and saw Dr. Darlyn ReadStacks for follow up 10/21/17         Objective:    Today's Vitals   10/22/17 0819  BP: 140/84  Pulse: (!) 49  Weight: (!) 329 lb (149.2 kg)  Height: 5\' 8"  (1.727 m)  PainSc: 3   PainLoc: Head   Body mass index is 50.02 kg/m.  Advanced Directives 10/22/2017 10/20/2017 07/05/2017 12/20/2015 11/01/2014  Does Patient Have a Medical Advance Directive? No No No No No  Would patient like  information on creating a medical advance directive? Yes (ED - Information included in AVS) No - Patient declined - No - patient declined information -    Current Medications (verified) Outpatient Encounter Medications as of 10/22/2017  Medication Sig  . acetaminophen (TYLENOL) 500 MG tablet Take 1,000 mg by mouth every 6 (six) hours as needed for headache (pain).   Marland Kitchen. albuterol (PROVENTIL HFA;VENTOLIN HFA) 108 (90 Base) MCG/ACT inhaler Inhale 1-2 puffs into the lungs every 6 (six) hours as needed for wheezing or shortness of breath.  . ALPRAZolam (XANAX) 0.5 MG tablet TAKE 1 TABLET BY MOUTH THREE TIMES DAILY AS NEEDED FOR  ANXIETY/PANIC  . aspirin EC 81 MG tablet Take 81 mg by mouth daily.  . carvedilol (COREG) 12.5 MG tablet Take 0.5 tablets (6.25 mg total) by mouth 2 (two) times daily with a meal.  . CHANTIX CONTINUING MONTH PAK 1 MG tablet   . dexlansoprazole (DEXILANT) 60 MG capsule Take 1 capsule (60 mg total) by mouth daily.  . DULoxetine (CYMBALTA) 60 MG capsule TAKE 2 CAPSULES BY MOUTH ONCE DAILY  . fluticasone (FLONASE) 50 MCG/ACT nasal spray Place 2 sprays into both nostrils daily. (Patient taking differently: Place 2 sprays into both nostrils daily as needed (seasonal  allergies). )  . gabapentin (NEURONTIN) 300 MG capsule TAKE 1 CAPSULE BY MOUTH THREE TIMES DAILY (Patient taking differently: TAKE 1 CAPSULE BY MOUTH TWO TIMES DAILY)  . levothyroxine (SYNTHROID, LEVOTHROID) 50 MCG tablet Take 1 tablet (50 mcg total) by mouth daily.  Marland Kitchen linaclotide (LINZESS) 290 MCG CAPS capsule Take 1 capsule (290 mcg total) by mouth daily before breakfast.  . Omega-3 Fatty Acids (FISH OIL) 1000 MG CAPS Take 1,000 mg by mouth daily.   . polyethylene glycol powder (GLYCOLAX/MIRALAX) powder Take 1 capful daily.  . sildenafil (REVATIO) 20 MG tablet Take 2-5 tablets daily as needed for sex  . SYMBICORT 160-4.5 MCG/ACT inhaler INHALE TWO PUFFS BY MOUTH TWICE DAILY FOR COPD/ASTHMA  . Testosterone (ANDROGEL  PUMP) 20.25 MG/ACT (1.62%) GEL Apply 4 pumps daily to upper chest and shoulders (Patient taking differently: Place 81 mg onto the skin See admin instructions. Apply 4 pumps (81 mg) daily to upper chest and shoulders)  . traZODone (DESYREL) 100 MG tablet TAKE 1 TABLET BY MOUTH AT BEDTIME  . gemfibrozil (LOPID) 600 MG tablet TAKE ONE TABLET BY MOUTH TWICE DAILY BEFORE A MEAL (Patient taking differently: Take 600 mg by mouth 2 (two) times daily. )   No facility-administered encounter medications on file as of 10/22/2017.     Allergies (verified) Chantix [varenicline] and Penicillins    History: Past Medical History:  Diagnosis Date  . Anxiety   . Depression   . Median nerve dysfunction   . Radiculopathy of cervical region   . Ulnar neuropathy at elbow   . Weakness    Past Surgical History:  Procedure Laterality Date  . BICEPS TENDON REPAIR    . NECK SURGERY     Fusion done two seperate times  . ROTATOR CUFF REPAIR Right    Family History  Problem Relation Age of Onset  . Hypertension Mother   . Stroke Mother   . Arrhythmia Father    Social History   Socioeconomic History  . Marital status: Single    Spouse name: None  . Number of children: None  . Years of education: None  . Highest education level: None  Social Needs  . Financial resource strain: None  . Food insecurity - worry: None  . Food insecurity - inability: None  . Transportation needs - medical: None  . Transportation needs - non-medical: None  Occupational History  . None  Tobacco Use  . Smoking status: Former Smoker    Packs/day: 0.50    Types: Cigarettes    Last attempt to quit: 07/23/2017    Years since quitting: 0.2  . Smokeless tobacco: Former Neurosurgeon    Types: Snuff, Chew  Substance and Sexual Activity  . Alcohol use: No  . Drug use: No  . Sexual activity: None  Other Topics Concern  . None  Social History Narrative  . None   Tobacco Counseling Counseling given: Yes   Clinical  Intake:     Pain Score: 3   Headache  Chest discomfort rated at 1                 Activities of Daily Living In your present state of health, do you have any difficulty performing the following activities: 10/22/2017  Hearing? N  Vision? Y  Comment Due for eye exam, will set up in the next few weeks  Difficulty concentrating or making decisions? N  Walking or climbing stairs? N  Dressing or bathing? N  Doing errands, shopping? N  Preparing Food and eating ? N  Using the Toilet? N  In the past six months, have you accidently leaked urine? N  Do you have problems with loss of bowel control? N  Managing your Medications? N  Managing your Finances? N  Housekeeping or managing your Housekeeping? N  Some recent data might be hidden     Immunizations and Health Maintenance Immunization History  Administered Date(s) Administered  . Tdap 11/16/2014   Health Maintenance Due  Topic Date Due  . HIV Screening  05/24/1988   Recommended at next visit with Dr. Darlyn Read  Patient Care Team: Mechele Claude, MD as PCP - General (Family Medicine)      Assessment:   This is a routine wellness examination for Rakan.  Hearing/Vision screen   No hearing deficit noted. Patient states he does not see as well as he used to.  He is seen at [facility], and is due for an eye exam- he plans to schedule within the next few weeks.  Dietary issues and exercise activities discussed:     Patient has recently been eating a diet high in protein and vegetables, whole grains and fruits in moderation, and drinking mostly water.  He states he has lost 20 lbs.  Advised him to continue this healthy eating regimen.  He is usually eating at least 2 eggs per day.  Advised him he may want to discard the egg yolk on one of the eggs to decrease fat intake.    For the last 3 weeks he has been walking on his treadmill at home for 30 minutes every day.    Goals    . Exercise 3x per week (60  min per time)     Join [employer] and attend classes 3 times per week for 1 hour.       Depression Screen PHQ 2/9 Scores 10/22/2017 10/21/2017 07/11/2017 07/07/2017  PHQ - 2 Score 1 - 0 0  PHQ- 9 Score - - - -  Exception Documentation - Patient refusal - -    Fall Risk Fall Risk  10/22/2017 04/21/2017 02/10/2017 01/20/2017 12/30/2016  Falls in the past year? No No No No No    Is the patient's home free of loose throw rugs in walkways, pet beds, electrical cords, etc?   yes      Grab bars in the bathroom? no      Handrails on the stairs?   yes      Adequate lighting?   yes    Cognitive Function: MMSE - Mini Mental State Exam 10/22/2017  Orientation to time 5  Orientation to Place 5  Registration 3  Attention/ Calculation 3  Recall 3  Language- name 2 objects 2  Language- repeat 1  Language- follow 3 step command 3  Language- read & follow direction 1  Write a sentence 1  Copy design 1  Total score 28        Screening Tests Health Maintenance  Topic Date Due  . HIV Screening  05/24/1988  . INFLUENZA VACCINE  06/09/2018 (Originally 04/23/2017)  . TETANUS/TDAP  11/16/2024    Qualifies for Shingles Vaccine?  No     Additional Screenings:  Hepatitis B/HIV/Syphillis: recommend at next visit with Dr. Darlyn Read       Plan:     Join silver sneakers at the [facility], and go 3 times per week to exercise class for 1 hour. Continue working on eating a healthy diet of lean protein, vegetables,  fruits, and whole grains Review the information given on Advanced Directives, and if you complete these please bring our office a copy to file in your chart. Schedule your eye exam.   I have personally reviewed and noted the following in the patient's chart:   . Medical and social history . Use of alcohol, tobacco or illicit drugs  . Current medications and supplements . Functional ability and status . Nutritional status . Physical activity . Advanced directives . List of  other physicians . Hospitalizations, surgeries, and ER visits in previous 12 months . Vitals . Screenings to include cognitive, depression, and falls . Referrals and appointments  In addition, I have reviewed and discussed with patient certain preventive protocols, quality metrics, and best practice recommendations. A written personalized care plan for preventive services as well as general preventive health recommendations were provided to patient.     WYATT, AMY M, RN   10/22/2017    I have reviewed and agree with the above AWV documentation.  Mechele Claude, M.D.

## 2017-10-22 NOTE — Patient Instructions (Addendum)
Consider joining silver sneakers at the [facility], and go 3 times per week to exercise class for 1 hour.    Continue working on eating a healthy diet of lean protein, vegetables, fruits, and whole grains  Please review the information given on Advanced Directives, and if you complete these please bring our office a copy to file in your chart.  Remember to schedule your eye exam.   Thank you for coming in for your Annual Wellness Visit today!   Preventive Care 40-64 Years, Male Preventive care refers to lifestyle choices and visits with your health care provider that can promote health and wellness. What does preventive care include?  A yearly physical exam. This is also called an annual well check.  Dental exams once or twice a year.  Routine eye exams. Ask your health care provider how often you should have your eyes checked.  Personal lifestyle choices, including: ? Daily care of your teeth and gums. ? Regular physical activity. ? Eating a healthy diet. ? Avoiding tobacco and drug use. ? Limiting alcohol use. ? Practicing safe sex. ? Taking low-dose aspirin every day starting at age 55. What happens during an annual well check? The services and screenings done by your health care provider during your annual well check will depend on your age, overall health, lifestyle risk factors, and family history of disease. Counseling Your health care provider may ask you questions about your:  Alcohol use.  Tobacco use.  Drug use.  Emotional well-being.  Home and relationship well-being.  Sexual activity.  Eating habits.  Work and work Statistician.  Screening You may have the following tests or measurements:  Height, weight, and BMI.  Blood pressure.  Lipid and cholesterol levels. These may be checked every 5 years, or more frequently if you are over 21 years old.  Skin check.  Lung cancer screening. You may have this screening every year starting at age 57 if  you have a 30-pack-year history of smoking and currently smoke or have quit within the past 15 years.  Fecal occult blood test (FOBT) of the stool. You may have this test every year starting at age 33.  Flexible sigmoidoscopy or colonoscopy. You may have a sigmoidoscopy every 5 years or a colonoscopy every 10 years starting at age 82.  Prostate cancer screening. Recommendations will vary depending on your family history and other risks.  Hepatitis C blood test.  Hepatitis B blood test.  Sexually transmitted disease (STD) testing.  Diabetes screening. This is done by checking your blood sugar (glucose) after you have not eaten for a while (fasting). You may have this done every 1-3 years.  Discuss your test results, treatment options, and if necessary, the need for more tests with your health care provider. Vaccines Your health care provider may recommend certain vaccines, such as:  Influenza vaccine. This is recommended every year.  Tetanus, diphtheria, and acellular pertussis (Tdap, Td) vaccine. You may need a Td booster every 10 years.  Varicella vaccine. You may need this if you have not been vaccinated.  Zoster vaccine. You may need this after age 21.  Measles, mumps, and rubella (MMR) vaccine. You may need at least one dose of MMR if you were born in 1957 or later. You may also need a second dose.  Pneumococcal 13-valent conjugate (PCV13) vaccine. You may need this if you have certain conditions and have not been vaccinated.  Pneumococcal polysaccharide (PPSV23) vaccine. You may need one or two doses if you  smoke cigarettes or if you have certain conditions.  Meningococcal vaccine. You may need this if you have certain conditions.  Hepatitis A vaccine. You may need this if you have certain conditions or if you travel or work in places where you may be exposed to hepatitis A.  Hepatitis B vaccine. You may need this if you have certain conditions or if you travel or work in  places where you may be exposed to hepatitis B.  Haemophilus influenzae type b (Hib) vaccine. You may need this if you have certain risk factors.  Talk to your health care provider about which screenings and vaccines you need and how often you need them. This information is not intended to replace advice given to you by your health care provider. Make sure you discuss any questions you have with your health care provider. Document Released: 10/06/2015 Document Revised: 05/29/2016 Document Reviewed: 07/11/2015 Elsevier Interactive Patient Education  [employer].

## 2017-10-24 ENCOUNTER — Ambulatory Visit: Payer: Medicare HMO | Admitting: Family Medicine

## 2017-10-30 ENCOUNTER — Ambulatory Visit (INDEPENDENT_AMBULATORY_CARE_PROVIDER_SITE_OTHER): Payer: Medicare HMO | Admitting: Family

## 2017-10-30 ENCOUNTER — Encounter: Payer: Self-pay | Admitting: Family

## 2017-10-30 VITALS — BP 121/78 | HR 54 | Temp 96.9°F | Ht 68.0 in | Wt 327.0 lb

## 2017-10-30 DIAGNOSIS — L03113 Cellulitis of right upper limb: Secondary | ICD-10-CM

## 2017-10-30 DIAGNOSIS — R21 Rash and other nonspecific skin eruption: Secondary | ICD-10-CM

## 2017-10-30 MED ORDER — CEPHALEXIN 500 MG PO CAPS: 500.0000 mg | ORAL_CAPSULE | Freq: Three times a day (TID) | ORAL | 0 refills | Status: DC

## 2017-10-30 MED ORDER — CEFTRIAXONE SODIUM 1 G IJ SOLR
1.0000 g | Freq: Once | INTRAMUSCULAR | Status: AC
  Administered 2017-10-30: 1 g via INTRAMUSCULAR

## 2017-10-30 NOTE — Progress Notes (Signed)
   Subjective:    Patient ID: Roy GarfinkelStephen M Deisher, male    DOB: 12/05/1970, 47 y.o.   MRN: [id number]  HPI Pt presents to the offiice today with right forearm redness and pain. Pt states "something has been biting me and yesterday I noticed my arm was hot and red yesterday".   Reports a purulent discharge and intermittent aching pain of 2 out 10. That is worse when he moves his arm. He has not taken anything for this. Denies any fevers.   Review of Systems  Skin: Positive for rash and wound.  All other systems reviewed and are negative.      Objective:   Physical Exam  Constitutional: He is oriented to person, place, and time. He appears well-developed and well-nourished. No distress.  HENT:  Head: Normocephalic.  Eyes: Pupils are equal, round, and reactive to light. Right eye exhibits no discharge. Left eye exhibits no discharge.  Neck: Normal range of motion. Neck supple. No thyromegaly present.  Cardiovascular: Normal rate, regular rhythm, normal heart sounds and intact distal pulses.  No murmur heard. Pulmonary/Chest: Effort normal and breath sounds normal. No respiratory distress. He has no wheezes.  Abdominal: Soft. Bowel sounds are normal. He exhibits no distension. There is no tenderness.  Musculoskeletal: He exhibits tenderness. He exhibits no edema.  Right forearm erythemas and cellulitis lesion apprx 1cmX0.5 cm with erythemas and hardness extending 5cmX5Xcm border   Neurological: He is alert and oriented to person, place, and time.  Skin: Skin is warm and dry. Rash (bilateral papule rash on bilateral arms extending up to sleeves ) noted. No erythema.  Psychiatric: He has a normal mood and affect. His behavior is normal. Judgment and thought content normal.  Vitals reviewed.       BP 121/78   Pulse (!) 54   Temp (!) 96.9 F (36.1 C) (Oral)   Ht 5\' 8"  (1.727 m)   Wt (!) 327 lb (148.3 kg)   BMI 49.72 kg/m      Assessment & Plan:  1. Cellulitis of right upper  extremity Keep elevated Do  Not pick or squeeze Cool compresses - cefTRIAXone (ROCEPHIN) injection 1 g - cephALEXin (KEFLEX) 500 MG capsule; Take 1 capsule (500 mg total) by mouth 3 (three) times daily.  Dispense: 30 capsule; Refill: 0  2. Rash and nonspecific skin eruption I do not believe this is bedbugs as patient states he sleeps in his boxers and only has this rash on bilateral arms up to his sleeves. Also, he is the only one in the family with this rash. Discussed importance of trying to figure out what is triggers these bites, Fleas?     Jannifer Rodneyhristy Hawks, FNP

## 2017-10-30 NOTE — Patient Instructions (Signed)

## 2017-11-07 ENCOUNTER — Encounter: Payer: Self-pay | Admitting: Family Medicine

## 2017-11-07 ENCOUNTER — Ambulatory Visit (INDEPENDENT_AMBULATORY_CARE_PROVIDER_SITE_OTHER): Payer: Medicare HMO | Admitting: Family Medicine

## 2017-11-07 VITALS — BP 120/73 | HR 59 | Temp 97.3°F | Ht 68.0 in | Wt 323.0 lb

## 2017-11-07 DIAGNOSIS — F410 Panic disorder [episodic paroxysmal anxiety] without agoraphobia: Secondary | ICD-10-CM | POA: Diagnosis not present

## 2017-11-07 DIAGNOSIS — I10 Essential (primary) hypertension: Secondary | ICD-10-CM

## 2017-11-07 DIAGNOSIS — L98492 Non-pressure chronic ulcer of skin of other sites with fat layer exposed: Secondary | ICD-10-CM | POA: Diagnosis not present

## 2017-11-07 DIAGNOSIS — R001 Bradycardia, unspecified: Secondary | ICD-10-CM | POA: Diagnosis not present

## 2017-11-07 MED ORDER — CARVEDILOL 6.25 MG PO TABS: 6.2500 mg | ORAL_TABLET | Freq: Two times a day (BID) | ORAL | 2 refills | Status: DC

## 2017-11-07 NOTE — Progress Notes (Signed)
Subjective:  Patient ID: Roy [name], male    DOB: [birth year]  Age: 47 y.o. MRN: [id number]  CC: Follow-up (pt here today following up after decreasing Carvedilol)   HPI Roy [name] presents for recheck of his blood pressure and pulse.  He was in recently with dizziness and balance issues after having noted his pulse dropping.  In the office last time it was 47.  Therefore his carvedilol dose was cut in half.  He denies having had any symptoms since that time.  Today's visit was to make sure that his pulse went up but that his blood pressure did not.  The plan was to start a non-chronotropic agent if needed.  Additionally he was in the last week and had an evaluation for lesions on the arms.  He was offered dermatology appointment today and declines that.  He is about through the antibiotic there is no discomfort but there is still notable ulceration at the right forearm.  He reports that his anxiety seems to have gotten a bit worse he and his wife have been arguing and there is been other situations at home.  He describes spells of his mind being foggy and feeling very anxious and having some chest tightness.  He was recently evaluated in the emergency department for the chest tightness and symptoms have not increased since then in fact they have diminished but have not gone away.  Depression screen [facility] 2/9 11/07/2017 10/22/2017 07/11/2017  Decreased Interest 0 - 0  Down, Depressed, Hopeless 1 1 0  PHQ - 2 Score 1 1 0  Altered sleeping - - -  Tired, decreased energy - - -  Change in appetite - - -  Feeling bad or failure about yourself  - - -  Trouble concentrating - - -  Moving slowly or fidgety/restless - - -  Suicidal thoughts - - -  PHQ-9 Score - - -  Difficult doing work/chores - - -    History Roy [name] has a past medical history of Anxiety, Depression, Median nerve dysfunction, Radiculopathy of cervical region, Ulnar neuropathy at elbow, and Weakness.   He has a past surgical  history that includes Rotator cuff repair (Right); Biceps tendon repair; and Neck surgery.   His family history includes Arrhythmia in his father; Hypertension in his mother; Stroke in his mother.He reports that he quit smoking about 3 months ago. His smoking use included cigarettes. He smoked 0.50 packs per day. He has quit using smokeless tobacco. His smokeless tobacco use included snuff and chew. He reports that he does not drink alcohol or use drugs.    ROS Review of Systems  Constitutional: Negative for diaphoresis.  Respiratory: Positive for chest tightness. Negative for shortness of breath.   Cardiovascular: Positive for chest pain and palpitations.  Gastrointestinal: Negative for nausea.  Psychiatric/Behavioral: Positive for agitation and dysphoric mood. The patient is nervous/anxious.     Objective:  BP 120/73   Pulse (!) 59   Temp (!) 97.3 F (36.3 C) (Oral)   Ht 5\' 8"  (1.727 m)   Wt (!) 323 lb (146.5 kg)   BMI 49.11 kg/m   BP Readings from Last 3 Encounters:  11/07/17 120/73  10/30/17 121/78  10/22/17 140/84    Wt Readings from Last 3 Encounters:  11/07/17 (!) 323 lb (146.5 kg)  10/30/17 (!) 327 lb (148.3 kg)  10/22/17 (!) 329 lb (149.2 kg)     Physical Exam  Constitutional: He is oriented to person, place, and  time. He appears well-developed and well-nourished. No distress.  HENT:  Head: Normocephalic and atraumatic.  Right Ear: External ear normal.  Left Ear: External ear normal.  Nose: Nose normal.  Mouth/Throat: Oropharynx is clear and moist.  Eyes: Conjunctivae and EOM are normal. Pupils are equal, round, and reactive to light.  Neck: Normal range of motion. Neck supple. No thyromegaly present.  Cardiovascular: Normal rate, regular rhythm and normal heart sounds.  No murmur heard. Pulmonary/Chest: Effort normal and breath sounds normal. No respiratory distress. He has no wheezes. He has no rales.  Abdominal: Soft. Bowel sounds are normal. He exhibits  no distension. There is no tenderness.  Lymphadenopathy:    He has no cervical adenopathy.  Neurological: He is alert and oriented to person, place, and time. He has normal reflexes.  Skin: Skin is warm and dry.  There is a 1 cm grade 2 ulcer at the site of the previous wound noted last week by Ms. Hawkes.  See her picture of that she entered into the chart at that time.  At this time there is no sign of infection but the ulcer does remain grade 2 although there is new skin forming around the margin.  Psychiatric: He has a normal mood and affect. His behavior is normal. Judgment and thought content normal.      Assessment & Plan:   Roy [name] was seen today for follow-up.  Diagnoses and all orders for this visit:  Skin ulcer with fat layer exposed (HCC)  Essential hypertension  Bradycardia  Anxiety attack  Other orders -     carvedilol (COREG) 6.25 MG tablet; Take 1 tablet (6.25 mg total) by mouth 2 (two) times daily with a meal.    We decreased his dose of carvedilol.  Making that permanently 6.25 mg twice daily.  Should the anxiety attacks continue we may need to start medication specific for that he will keep me posted.  He should finish up his antibiotic but no further antibiotic should be necessary for the skin lesion as long as he will keep it covered and use Neosporin on it daily.  Monitor for recurrent erythema pain and purulence.  He was offered referral to dermatology since these seem to be recurring.  Patient said he wanted to consider it and he would let us know.   I have discontinued Justine Cossin. Koon's carvedilol. I am also having him start on carvedilol. Additionally, I am having him maintain his Fish Oil, fluticasone, aspirin EC, Testosterone, levothyroxine, gemfibrozil, acetaminophen, albuterol, traZODone, SYMBICORT, dexlansoprazole, linaclotide, polyethylene glycol powder, gabapentin, ALPRAZolam, DULoxetine, sildenafil, CHANTIX CONTINUING MONTH PAK, and  cephALEXin.  Allergies as of 11/07/2017      Reactions   Chantix [varenicline] Other (See Comments)   Severe headache      Medication List        Accurate as of 11/07/17  3:55 PM. Always use your most recent med list.          acetaminophen 500 MG tablet Commonly known as:  TYLENOL Take 1,000 mg by mouth every 6 (six) hours as needed for headache (pain).   albuterol 108 (90 Base) MCG/ACT inhaler Commonly known as:  PROVENTIL HFA;VENTOLIN HFA Inhale 1-2 puffs into the lungs every 6 (six) hours as needed for wheezing or shortness of breath.   ALPRAZolam 0.5 MG tablet Commonly known as:  XANAX TAKE 1 TABLET BY MOUTH THREE TIMES DAILY AS NEEDED FOR  ANXIETY/PANIC   aspirin EC 81 MG tablet Take 81 mg by mouth  daily.   carvedilol 6.25 MG tablet Commonly known as:  COREG Take 1 tablet (6.25 mg total) by mouth 2 (two) times daily with a meal.   cephALEXin 500 MG capsule Commonly known as:  KEFLEX Take 1 capsule (500 mg total) by mouth 3 (three) times daily.   CHANTIX CONTINUING MONTH PAK 1 MG tablet Generic drug:  varenicline   dexlansoprazole 60 MG capsule Commonly known as:  DEXILANT Take 1 capsule (60 mg total) by mouth daily.   DULoxetine 60 MG capsule Commonly known as:  CYMBALTA TAKE 2 CAPSULES BY MOUTH ONCE DAILY   Fish Oil 1000 MG Caps Take 1,000 mg by mouth daily.   fluticasone 50 MCG/ACT nasal spray Commonly known as:  FLONASE Place 2 sprays into both nostrils daily.   gabapentin 300 MG capsule Commonly known as:  NEURONTIN TAKE 1 CAPSULE BY MOUTH THREE TIMES DAILY   gemfibrozil 600 MG tablet Commonly known as:  LOPID TAKE ONE TABLET BY MOUTH TWICE DAILY BEFORE A MEAL   levothyroxine 50 MCG tablet Commonly known as:  SYNTHROID, LEVOTHROID Take 1 tablet (50 mcg total) by mouth daily.   linaclotide 290 MCG Caps capsule Commonly known as:  LINZESS Take 1 capsule (290 mcg total) by mouth daily before breakfast.   polyethylene glycol powder  powder Commonly known as:  GLYCOLAX/MIRALAX Take 1 capful daily.   sildenafil 20 MG tablet Commonly known as:  REVATIO Take 2-5 tablets daily as needed for sex   SYMBICORT 160-4.5 MCG/ACT inhaler Generic drug:  budesonide-formoterol INHALE TWO PUFFS BY MOUTH TWICE DAILY FOR COPD/ASTHMA   Testosterone 20.25 MG/ACT (1.62%) Gel Commonly known as:  ANDROGEL PUMP Apply 4 pumps daily to upper chest and shoulders   traZODone 100 MG tablet Commonly known as:  DESYREL TAKE 1 TABLET BY MOUTH AT BEDTIME        Follow-up: Return in about 3 months (around 02/04/2018).  Mechele ClaudeWarren Gurnoor Ursua, M.D.

## 2017-11-27 ENCOUNTER — Telehealth: Payer: Self-pay | Admitting: *Deleted

## 2017-11-27 NOTE — Telephone Encounter (Signed)
Fax received Needing ok to change Levothyroxin to mylan brand due to back order Signed fax by Dr. Darlyn Read faxed back on 11/27/17

## 2017-12-07 ENCOUNTER — Other Ambulatory Visit: Payer: Self-pay | Admitting: Family Medicine

## 2017-12-16 ENCOUNTER — Encounter: Payer: Self-pay | Admitting: Physician Assistant

## 2017-12-16 ENCOUNTER — Ambulatory Visit (INDEPENDENT_AMBULATORY_CARE_PROVIDER_SITE_OTHER): Payer: Medicare HMO | Admitting: Physician Assistant

## 2017-12-16 VITALS — BP 136/74 | HR 54 | Temp 97.0°F | Ht 68.0 in | Wt 334.4 lb

## 2017-12-16 DIAGNOSIS — R079 Chest pain, unspecified: Secondary | ICD-10-CM

## 2017-12-16 DIAGNOSIS — J40 Bronchitis, not specified as acute or chronic: Secondary | ICD-10-CM | POA: Diagnosis not present

## 2017-12-16 MED ORDER — PREDNISONE 10 MG (21) PO TBPK: ORAL_TABLET | ORAL | 0 refills | Status: DC

## 2017-12-16 MED ORDER — AZITHROMYCIN 250 MG PO TABS: ORAL_TABLET | ORAL | 0 refills | Status: DC

## 2017-12-16 MED ORDER — ALBUTEROL SULFATE HFA 108 (90 BASE) MCG/ACT IN AERS: 1.0000 | INHALATION_SPRAY | Freq: Four times a day (QID) | RESPIRATORY_TRACT | 2 refills | Status: DC | PRN

## 2017-12-16 NOTE — Progress Notes (Signed)
BP 136/74   Pulse (!) 54   Temp (!) 97 F (36.1 C) (Oral)   Ht 5\' 8"  (1.727 m)   Wt (!) 334 lb 6.4 oz (151.7 kg)   SpO2 99%   BMI 50.85 kg/m    Subjective:    Patient ID: Roy GarfinkelStephen M Ngo, male    DOB: 03/16/1971, 47 y.o.   MRN: [id number]  HPI: Roy [name] is a 47 y.o. male presenting on 12/16/2017 for Shortness of Breath and Chest Pain  Patient comes in with 5-day history of chest pain in the mid sternal area.  He will occasionally have shortness of breath.  He has not run a fever.  He denies any radiation into the neck throat or arms.  He has had a history of bronchitis in the past.  He is currently using Symbicort.  He was anxious because the chest pain was just in the center of his chest.  It is not made worse or better by any activity.  It can happen while sitting or moving.   Past Medical History:  Diagnosis Date  . Anxiety   . Depression   . Median nerve dysfunction   . Radiculopathy of cervical region   . Ulnar neuropathy at elbow   . Weakness    Relevant past medical, surgical, family and social history reviewed and updated as indicated. Interim medical history since our last visit reviewed. Allergies and medications reviewed and updated. DATA REVIEWED: CHART IN EPIC  Family History reviewed for pertinent findings.  Review of Systems  Constitutional: Positive for fatigue. Negative for appetite change and fever.  HENT: Positive for sinus pressure and sore throat.   Eyes: Negative.  Negative for pain and visual disturbance.  Respiratory: Positive for cough, chest tightness, shortness of breath and wheezing.   Cardiovascular: Negative.  Negative for chest pain, palpitations and leg swelling.  Gastrointestinal: Negative.  Negative for abdominal pain, diarrhea, nausea and vomiting.  Endocrine: Negative.   Genitourinary: Negative.   Musculoskeletal: Negative for back pain and myalgias.  Skin: Negative.  Negative for color change and rash.  Neurological: Negative  for weakness, numbness and headaches.  Psychiatric/Behavioral: Negative.     Allergies as of 12/16/2017      Reactions   Chantix [varenicline] Other (See Comments)   Severe headache      Medication List        Accurate as of 12/16/17 11:37 AM. Always use your most recent med list.          acetaminophen 500 MG tablet Commonly known as:  TYLENOL Take 1,000 mg by mouth every 6 (six) hours as needed for headache (pain).   albuterol 108 (90 Base) MCG/ACT inhaler Commonly known as:  PROVENTIL HFA;VENTOLIN HFA Inhale 1-2 puffs into the lungs every 6 (six) hours as needed for wheezing or shortness of breath.   ALPRAZolam 0.5 MG tablet Commonly known as:  XANAX TAKE 1 TABLET BY MOUTH THREE TIMES DAILY AS NEEDED FOR  ANXIETY/PANIC   aspirin EC 81 MG tablet Take 81 mg by mouth daily.   azithromycin 250 MG tablet Commonly known as:  ZITHROMAX Take as directed   carvedilol 6.25 MG tablet Commonly known as:  COREG Take 1 tablet (6.25 mg total) by mouth 2 (two) times daily with a meal.   CHANTIX CONTINUING MONTH PAK 1 MG tablet Generic drug:  varenicline   dexlansoprazole 60 MG capsule Commonly known as:  DEXILANT Take 1 capsule (60 mg total) by mouth daily.  DULoxetine 60 MG capsule Commonly known as:  CYMBALTA TAKE 2 CAPSULES BY MOUTH ONCE DAILY   Fish Oil 1000 MG Caps Take 1,000 mg by mouth daily.   fluticasone 50 MCG/ACT nasal spray Commonly known as:  FLONASE Place 2 sprays into both nostrils daily.   gabapentin 300 MG capsule Commonly known as:  NEURONTIN TAKE 1 CAPSULE BY MOUTH THREE TIMES DAILY   gemfibrozil 600 MG tablet Commonly known as:  LOPID TAKE ONE TABLET BY MOUTH TWICE DAILY BEFORE A MEAL   levothyroxine 50 MCG tablet Commonly known as:  SYNTHROID, LEVOTHROID Take 1 tablet (50 mcg total) by mouth daily.   linaclotide 290 MCG Caps capsule Commonly known as:  LINZESS Take 1 capsule (290 mcg total) by mouth daily before breakfast.     polyethylene glycol powder powder Commonly known as:  GLYCOLAX/MIRALAX Take 1 capful daily.   predniSONE 10 MG (21) Tbpk tablet Commonly known as:  STERAPRED UNI-PAK 21 TAB As directed x 6 days   sildenafil 20 MG tablet Commonly known as:  REVATIO Take 2-5 tablets daily as needed for sex   SYMBICORT 160-4.5 MCG/ACT inhaler Generic drug:  budesonide-formoterol INHALE TWO PUFFS BY MOUTH TWICE DAILY FOR COPD/ASTHMA   Testosterone 20.25 MG/ACT (1.62%) Gel Commonly known as:  ANDROGEL PUMP Apply 4 pumps daily to upper chest and shoulders   traZODone 100 MG tablet Commonly known as:  DESYREL TAKE 1 TABLET BY MOUTH AT BEDTIME          Objective:    BP 136/74   Pulse (!) 54   Temp (!) 97 F (36.1 C) (Oral)   Ht 5\' 8"  (1.727 m)   Wt (!) 334 lb 6.4 oz (151.7 kg)   SpO2 99%   BMI 50.85 kg/m   Allergies  Allergen Reactions  . Chantix [Varenicline] Other (See Comments)    Severe headache    Wt Readings from Last 3 Encounters:  12/16/17 (!) 334 lb 6.4 oz (151.7 kg)  11/07/17 (!) 323 lb (146.5 kg)  10/30/17 (!) 327 lb (148.3 kg)    Physical Exam  Constitutional: He appears well-developed and well-nourished.  HENT:  Head: Normocephalic and atraumatic.  Right Ear: Hearing and tympanic membrane normal.  Left Ear: Hearing and tympanic membrane normal.  Nose: Mucosal edema and sinus tenderness present. No nasal deformity. Right sinus exhibits frontal sinus tenderness. Left sinus exhibits frontal sinus tenderness.  Mouth/Throat: Posterior oropharyngeal erythema present.  Eyes: Pupils are equal, round, and reactive to light. Conjunctivae and EOM are normal. Right eye exhibits no discharge. Left eye exhibits no discharge.  Neck: Normal range of motion. Neck supple.  Cardiovascular: Normal rate, regular rhythm and normal heart sounds.  Pulmonary/Chest: Effort normal. No respiratory distress. He has no decreased breath sounds. He has wheezes. He has no rhonchi. He has no rales.   Abdominal: Soft. Bowel sounds are normal.  Musculoskeletal: Normal range of motion.  Skin: Skin is warm and dry.  Nursing note and vitals reviewed.       Assessment & Plan:   1. Chest pain, unspecified type - EKG 12-Lead Reading was normal  2. Bronchitis - albuterol (PROVENTIL HFA;VENTOLIN HFA) 108 (90 Base) MCG/ACT inhaler; Inhale 1-2 puffs into the lungs every 6 (six) hours as needed for wheezing or shortness of breath.  Dispense: 1 Inhaler; Refill: 2 - azithromycin (ZITHROMAX) 250 MG tablet; Take as directed  Dispense: 6 tablet; Refill: 0 - predniSONE (STERAPRED UNI-PAK 21 TAB) 10 MG (21) TBPK tablet; As directed x 6  days  Dispense: 21 tablet; Refill: 0   Continue all other maintenance medications as listed above.  Follow up plan: Keep follow up with PCP Follow-up as needed or worsening of symptoms. Call office for any issues.   Educational handout given for survey  Remus Loffler PA-C [facility] [address]  [place], [place] [address] [phone]   12/16/2017, 11:37 AM

## 2017-12-23 ENCOUNTER — Encounter: Payer: Self-pay | Admitting: Family

## 2017-12-23 ENCOUNTER — Ambulatory Visit (INDEPENDENT_AMBULATORY_CARE_PROVIDER_SITE_OTHER): Payer: Medicare HMO | Admitting: Family

## 2017-12-23 VITALS — BP 122/74 | HR 51 | Temp 97.0°F | Ht 68.0 in | Wt 328.6 lb

## 2017-12-23 DIAGNOSIS — K219 Gastro-esophageal reflux disease without esophagitis: Secondary | ICD-10-CM | POA: Diagnosis not present

## 2017-12-23 DIAGNOSIS — R1013 Epigastric pain: Secondary | ICD-10-CM

## 2017-12-23 MED ORDER — RANITIDINE HCL 150 MG PO TABS: 150.0000 mg | ORAL_TABLET | Freq: Every day | ORAL | 1 refills | Status: DC

## 2017-12-23 NOTE — Progress Notes (Signed)
   Subjective:    Patient ID: Roy [name], male    DOB: June 26, 1971, 47 y.o.   MRN: [id number]  Gastroesophageal Reflux  He complains of chest pain, coughing, heartburn and a hoarse voice. He reports no belching. This is a chronic problem. The current episode started in the past 7 days. The problem occurs occasionally. The problem has been waxing and waning. The heartburn duration is several minutes. The heartburn is located in the substernum. The symptoms are aggravated by certain foods. Risk factors include obesity. He has tried a PPI for the symptoms. The treatment provided moderate relief.      Review of Systems  HENT: Positive for hoarse voice.   Respiratory: Positive for cough.   Cardiovascular: Positive for chest pain.  Gastrointestinal: Positive for heartburn.  All other systems reviewed and are negative.      Objective:   Physical Exam  Constitutional: He is oriented to person, place, and time. He appears well-developed and well-nourished. No distress.  HENT:  Head: Normocephalic.  Right Ear: External ear normal.  Left Ear: External ear normal.  Mouth/Throat: Oropharynx is clear and moist.  Eyes: Pupils are equal, round, and reactive to light. Right eye exhibits no discharge. Left eye exhibits no discharge.  Neck: Normal range of motion. Neck supple. No thyromegaly present.  Cardiovascular: Normal rate, regular rhythm, normal heart sounds and intact distal pulses.  No murmur heard. Pulmonary/Chest: Effort normal and breath sounds normal. No respiratory distress. He has no wheezes.  Abdominal: Soft. Bowel sounds are normal. He exhibits no distension. There is no tenderness.  Musculoskeletal: Normal range of motion. He exhibits no edema or tenderness.  Neurological: He is alert and oriented to person, place, and time.  Skin: Skin is warm and dry. No rash noted. No erythema.  Psychiatric: He has a normal mood and affect. His behavior is normal. Judgment and thought content  normal.  Vitals reviewed.     BP 122/74   Pulse (!) 51   Temp (!) 97 F (36.1 C) (Oral)   Ht '5\' 8"'$  (1.727 m)   Wt (!) 328 lb 9.6 oz (149.1 kg)   SpO2 97%   BMI 49.96 kg/m      Assessment & Plan:  1. Gastroesophageal reflux disease, esophagitis presence not specified -Continue Dexilant and start Zantac today Referral to GI -Diet discussed- Avoid fried, spicy, citrus foods, caffeine and alcohol -Do not eat 2-3 hours before bedtime -Encouraged small frequent meals -Avoid NSAID's - Ambulatory referral to Gastroenterology - ranitidine (ZANTAC) 150 MG tablet; Take 1 tablet (150 mg total) by mouth at bedtime.  Dispense: 90 tablet; Refill: 1 - CBC with Differential/Platelet - BMP8+EGFR  2. Morbid obesity (Chloride) - CBC with Differential/Platelet - BMP8+EGFR  3. Epigastric pain - CBC with Differential/Platelet - BMP8+EGFR - Amylase - Lipase - H Pylori, IGM, IGG, IGA AB    Evelina Dun, FNP

## 2017-12-23 NOTE — Patient Instructions (Signed)
Food Choices for Gastroesophageal Reflux Disease, Adult When you have gastroesophageal reflux disease (GERD), the foods you eat and your eating habits are very important. Choosing the right foods can help ease your discomfort. What guidelines do I need to follow?  Choose fruits, vegetables, whole grains, and low-fat dairy products.  Choose low-fat meat, fish, and poultry.  Limit fats such as oils, salad dressings, butter, nuts, and avocado.  Keep a food diary. This helps you identify foods that cause symptoms.  Avoid foods that cause symptoms. These may be different for everyone.  Eat small meals often instead of 3 large meals a day.  Eat your meals slowly, in a place where you are relaxed.  Limit fried foods.  Cook foods using methods other than frying.  Avoid drinking alcohol.  Avoid drinking large amounts of liquids with your meals.  Avoid bending over or lying down until 2-3 hours after eating. What foods are not recommended? These are some foods and drinks that may make your symptoms worse: Vegetables  Tomatoes. Tomato juice. Tomato and spaghetti sauce. Chili peppers. Onion and garlic. Horseradish. Fruits  Oranges, grapefruit, and lemon (fruit and juice). Meats  High-fat meats, fish, and poultry. This includes hot dogs, ribs, ham, sausage, salami, and bacon. Dairy  Whole milk and chocolate milk. Sour cream. Cream. Butter. Ice cream. Cream cheese. Drinks  Coffee and tea. Bubbly (carbonated) drinks or energy drinks. Condiments  Hot sauce. Barbecue sauce. Sweets/Desserts  Chocolate and cocoa. Donuts. Peppermint and spearmint. Fats and Oils  High-fat foods. This includes French fries and potato chips. Other  Vinegar. Strong spices. This includes black pepper, white pepper, red pepper, cayenne, curry powder, cloves, ginger, and chili powder. The items listed above may not be a complete list of foods and drinks to avoid. Contact your dietitian for more information.    This information is not intended to replace advice given to you by your health care provider. Make sure you discuss any questions you have with your health care provider. Document Released: 03/10/2012 Document Revised: 02/15/2016 Document Reviewed: 07/14/2013 [employer] Interactive Patient Education  2017 [employer]  

## 2017-12-24 ENCOUNTER — Other Ambulatory Visit: Payer: Self-pay | Admitting: Family Medicine

## 2017-12-24 LAB — H PYLORI, IGM, IGG, IGA AB
H pylori, IgM Abs: <9 units (ref 0.0–8.9)
H. pylori, IgA Abs: <9 units (ref 0.0–8.9)
H. pylori, IgG AbS: 0.97 Index Value — ABNORMAL HIGH (ref 0.00–0.79)

## 2017-12-24 LAB — CBC WITH DIFFERENTIAL/PLATELET
BASOS ABS: 0 10*3/uL (ref 0.0–0.2)
Basos: 0 %
EOS (ABSOLUTE): 0.1 10*3/uL (ref 0.0–0.4)
EOS: 1 %
HEMATOCRIT: 38.6 % (ref 37.5–51.0)
HEMOGLOBIN: 12.7 g/dL — AB (ref 13.0–17.7)
Immature Grans (Abs): 0 10*3/uL (ref 0.0–0.1)
Immature Granulocytes: 0 %
LYMPHS ABS: 2.4 10*3/uL (ref 0.7–3.1)
Lymphs: 31 %
MCH: 30.5 pg (ref 26.6–33.0)
MCHC: 32.9 g/dL (ref 31.5–35.7)
MCV: 93 fL (ref 79–97)
MONOCYTES: 5 %
Monocytes Absolute: 0.3 10*3/uL (ref 0.1–0.9)
NEUTROS ABS: 4.8 10*3/uL (ref 1.4–7.0)
Neutrophils: 63 %
Platelets: 281 10*3/uL (ref 150–379)
RBC: 4.16 x10E6/uL (ref 4.14–5.80)
RDW: 13.8 % (ref 12.3–15.4)
WBC: 7.6 10*3/uL (ref 3.4–10.8)

## 2017-12-24 LAB — BMP8+EGFR
BUN / CREAT RATIO: 19 (ref 9–20)
BUN: 16 mg/dL (ref 6–24)
CO2: 22 mmol/L (ref 20–29)
CREATININE: 0.84 mg/dL (ref 0.76–1.27)
Calcium: 9.3 mg/dL (ref 8.7–10.2)
Chloride: 96 mmol/L (ref 96–106)
GFR, EST AFRICAN AMERICAN: 123 mL/min/{1.73_m2} (ref >59)
GFR, EST NON AFRICAN AMERICAN: 106 mL/min/{1.73_m2} (ref >59)
Glucose: 107 mg/dL — ABNORMAL HIGH (ref 65–99)
Potassium: 3.9 mmol/L (ref 3.5–5.2)
SODIUM: 138 mmol/L (ref 134–144)

## 2017-12-24 LAB — LIPASE: Lipase: 53 U/L (ref 13–78)

## 2017-12-24 LAB — AMYLASE: Amylase: 50 U/L (ref 31–124)

## 2018-01-02 ENCOUNTER — Other Ambulatory Visit: Payer: Self-pay | Admitting: Family Medicine

## 2018-01-02 DIAGNOSIS — M5416 Radiculopathy, lumbar region: Secondary | ICD-10-CM

## 2018-01-24 ENCOUNTER — Other Ambulatory Visit: Payer: Self-pay | Admitting: Family Medicine

## 2018-02-02 ENCOUNTER — Other Ambulatory Visit: Payer: Self-pay | Admitting: Family Medicine

## 2018-02-18 ENCOUNTER — Encounter: Payer: Self-pay | Admitting: Family Medicine

## 2018-02-18 ENCOUNTER — Ambulatory Visit (INDEPENDENT_AMBULATORY_CARE_PROVIDER_SITE_OTHER): Payer: Medicare HMO | Admitting: Family Medicine

## 2018-02-18 VITALS — BP 115/67 | HR 61 | Temp 97.2°F | Ht 68.0 in | Wt 334.4 lb

## 2018-02-18 DIAGNOSIS — K219 Gastro-esophageal reflux disease without esophagitis: Secondary | ICD-10-CM

## 2018-02-18 DIAGNOSIS — E039 Hypothyroidism, unspecified: Secondary | ICD-10-CM | POA: Diagnosis not present

## 2018-02-18 DIAGNOSIS — M5416 Radiculopathy, lumbar region: Secondary | ICD-10-CM | POA: Diagnosis not present

## 2018-02-18 DIAGNOSIS — I10 Essential (primary) hypertension: Secondary | ICD-10-CM | POA: Diagnosis not present

## 2018-02-18 DIAGNOSIS — E349 Endocrine disorder, unspecified: Secondary | ICD-10-CM | POA: Diagnosis not present

## 2018-02-18 MED ORDER — VARENICLINE TARTRATE 1 MG PO TABS: 1.0000 mg | ORAL_TABLET | Freq: Every day | ORAL | 5 refills | Status: DC

## 2018-02-18 MED ORDER — LEVOTHYROXINE SODIUM 50 MCG PO TABS: 50.0000 ug | ORAL_TABLET | Freq: Every day | ORAL | 5 refills | Status: DC

## 2018-02-18 MED ORDER — CARVEDILOL 6.25 MG PO TABS: 6.2500 mg | ORAL_TABLET | Freq: Two times a day (BID) | ORAL | 1 refills | Status: DC

## 2018-02-18 MED ORDER — LINACLOTIDE 290 MCG PO CAPS: 290.0000 ug | ORAL_CAPSULE | Freq: Every day | ORAL | 1 refills | Status: DC

## 2018-02-18 MED ORDER — DULOXETINE HCL 60 MG PO CPEP: 120.0000 mg | ORAL_CAPSULE | Freq: Every day | ORAL | 1 refills | Status: DC

## 2018-02-18 MED ORDER — CELECOXIB 200 MG PO CAPS: 200.0000 mg | ORAL_CAPSULE | Freq: Every day | ORAL | 5 refills | Status: DC

## 2018-02-18 MED ORDER — GABAPENTIN 300 MG PO CAPS: 300.0000 mg | ORAL_CAPSULE | Freq: Three times a day (TID) | ORAL | 1 refills | Status: DC

## 2018-02-18 MED ORDER — DOXYCYCLINE HYCLATE 100 MG PO TABS: 100.0000 mg | ORAL_TABLET | Freq: Two times a day (BID) | ORAL | 0 refills | Status: DC

## 2018-02-18 NOTE — Progress Notes (Signed)
Subjective:  Patient ID: Roy [name], male    DOB: September 03, 1971  Age: 47 y.o. MRN: [id number]  CC: Medical Management of Chronic Issues   HPI Roy [name] presents for Follow up for testosterone deficiency: Pt. Using medication as directed. Denies any sx referrable to DVT such as edema or erythema of legs. No dyspnea or chest pain. Energy level reported as being good. Libido is normal and denies E.D. Feels strength is adequate and improved from baseline.   Patient presents for follow-up on  thyroid. The patient has a history of hypothyroidism for many years. It has been stable recently. Pt. denies any change in  voice, loss of hair, heat or cold intolerance. Energy level has been adequate to good. Patient denies constipation and diarrhea. No myxedema. Medication is as noted below. Verified that pt is taking it daily on an empty stomach. Well tolerated.  Depression screen [facility] 2/9 02/19/2018 02/18/2018 12/23/2017  Decreased Interest 1 2 0  Down, Depressed, Hopeless 1 2 0  PHQ - 2 Score 2 4 0  Altered sleeping 0 0 -  Tired, decreased energy 2 2 -  Change in appetite - 0 -  Feeling bad or failure about yourself  1 0 -  Trouble concentrating 0 0 -  Moving slowly or fidgety/restless 0 0 -  Suicidal thoughts 0 0 -  PHQ-9 Score 5 6 -  Difficult doing work/chores Somewhat difficult - -  Some recent data might be hidden    History Roy [name] has a past medical history of Anxiety, Depression, Hypertension, Low testosterone, Median nerve dysfunction, Radiculopathy of cervical region, Thyroid disease, Ulnar neuropathy at elbow, and Weakness.   He has a past surgical history that includes Rotator cuff repair (Right); Biceps tendon repair; and Neck surgery.   His family history includes Arrhythmia in his father; Asthma in his brother and daughter; Bipolar disorder in his brother; COPD in his father; Fibromyalgia in his sister; Hypertension in his mother; Stroke (age of onset: 62) in his mother.He  reports that he quit smoking about 7 months ago. His smoking use included cigarettes. He has a 15.00 pack-year smoking history. He has quit using smokeless tobacco. His smokeless tobacco use included snuff and chew. He reports that he does not drink alcohol or use drugs.    ROS Review of Systems  Constitutional: Negative.   HENT: Negative.   Eyes: Negative for visual disturbance.  Respiratory: Negative for cough and shortness of breath.   Cardiovascular: Negative for chest pain and leg swelling.  Gastrointestinal: Negative for abdominal pain, diarrhea, nausea and vomiting.  Genitourinary: Negative for difficulty urinating.  Musculoskeletal: Negative for arthralgias and myalgias.  Skin: Negative for rash.  Neurological: Negative for headaches.  Psychiatric/Behavioral: Negative for sleep disturbance.    Objective:  BP 115/67   Pulse 61   Temp (!) 97.2 F (36.2 C) (Oral)   Ht '5\' 8"'$  (1.727 m)   Wt (!) 334 lb 6 oz (151.7 kg)   BMI 50.84 kg/m   BP Readings from Last 3 Encounters:  02/19/18 125/78  02/18/18 115/67  12/23/17 122/74    Wt Readings from Last 3 Encounters:  02/19/18 (!) 335 lb (152 kg)  02/18/18 (!) 334 lb 6 oz (151.7 kg)  12/23/17 (!) 328 lb 9.6 oz (149.1 kg)     Physical Exam  Constitutional: He is oriented to person, place, and time. He appears well-developed and well-nourished. No distress.  HENT:  Head: Normocephalic and atraumatic.  Right Ear: External ear  normal.  Left Ear: External ear normal.  Nose: Nose normal.  Mouth/Throat: Oropharynx is clear and moist.  Eyes: Pupils are equal, round, and reactive to light. Conjunctivae and EOM are normal.  Neck: Normal range of motion. Neck supple. No thyromegaly present.  Cardiovascular: Normal rate, regular rhythm and normal heart sounds.  No murmur heard. Pulmonary/Chest: Effort normal and breath sounds normal. No respiratory distress. He has no wheezes. He has no rales.  Abdominal: Soft. Bowel sounds are  normal. He exhibits no distension. There is no tenderness.  Musculoskeletal: Tenderness: Lower lumbar region bilaterally.  Lymphadenopathy:    He has no cervical adenopathy.  Neurological: He is alert and oriented to person, place, and time. He has normal reflexes.  Skin: Skin is warm and dry.  Psychiatric: He has a normal mood and affect. His behavior is normal. Judgment and thought content normal.      Assessment & Plan:   Roy [name] was seen today for medical management of chronic issues.  Diagnoses and all orders for this visit:  Hypotestosteronism -     Testosterone,Free and Total  Hypothyroidism, unspecified type -     levothyroxine (SYNTHROID, LEVOTHROID) 50 MCG tablet; Take 1 tablet (50 mcg total) by mouth daily. -     CBC with Differential/Platelet -     CMP14+EGFR -     TSH -     T4, Free  Lumbar radiculopathy -     DULoxetine (CYMBALTA) 60 MG capsule; Take 2 capsules (120 mg total) by mouth daily.  Morbid obesity (HCC)  Benign essential HTN  Gastroesophageal reflux disease, esophagitis presence not specified  Other orders -     carvedilol (COREG) 6.25 MG tablet; Take 1 tablet (6.25 mg total) by mouth 2 (two) times daily with a meal. -     gabapentin (NEURONTIN) 300 MG capsule; Take 1 capsule (300 mg total) by mouth 3 (three) times daily. -     linaclotide (LINZESS) 290 MCG CAPS capsule; Take 1 capsule (290 mcg total) by mouth daily before breakfast. -     varenicline (CHANTIX CONTINUING MONTH [name]) 1 MG tablet; Take 1 tablet (1 mg total) by mouth daily. -     doxycycline (VIBRA-TABS) 100 MG tablet; Take 1 tablet (100 mg total) by mouth 2 (two) times daily. -     celecoxib (CELEBREX) 200 MG capsule; Take 1 capsule (200 mg total) by mouth daily. For arthritis       I have changed Roy [name] to varenicline. I have also changed his DULoxetine and gabapentin. I am also having him start on doxycycline and celecoxib. Additionally,  I am having him maintain his Fish Oil, fluticasone, aspirin EC, Testosterone, gemfibrozil, acetaminophen, SYMBICORT, polyethylene glycol powder, sildenafil, albuterol, ranitidine, ALPRAZolam, traZODone, DEXILANT, levothyroxine, carvedilol, and linaclotide.  Allergies as of 02/18/2018      Reactions   Penicillins Rash, Other (See Comments)   Has patient had a PCN reaction causing immediate rash, facial/tongue/throat swelling, SOB or lightheadedness with hypotension: Yes Has patient had a PCN reaction causing severe rash involving mucus membranes or skin necrosis: No Has patient had a PCN reaction that required hospitalization: No Has patient had a PCN reaction occurring within the last 10 years: No If all of the above answers are "NO", then may proceed with Cephalosporin use. Has patient had a PCN reaction causing immediate rash, facial/tongue/throat swelling, SOB or lightheadedness with hypotension: Yes Has patient had a PCN reaction causing severe rash involving mucus membranes or  skin necrosis: No Has patient had a PCN reaction that required hospitalization: No Has patient had a PCN reaction occurring within the last 10 years: No If all of the above answers are "NO", then may proceed with Cephalosporin use. unknown   Chantix [varenicline] Other (See Comments)   Severe headache      Medication List        Accurate as of 02/18/18 11:59 PM. Always use your most recent med list.          acetaminophen 500 MG tablet Commonly known as:  TYLENOL Take 1,000 mg by mouth every 6 (six) hours as needed for headache (pain).   albuterol 108 (90 Base) MCG/ACT inhaler Commonly known as:  PROVENTIL HFA;VENTOLIN HFA Inhale 1-2 puffs into the lungs every 6 (six) hours as needed for wheezing or shortness of breath.   ALPRAZolam 0.5 MG tablet Commonly known as:  XANAX TAKE 1 TABLET BY MOUTH THREE TIMES DAILY AS NEEDED FOR  ANXIETY/PANIC   aspirin EC 81 MG tablet Take 81 mg by mouth daily.     carvedilol 6.25 MG tablet Commonly known as:  COREG Take 1 tablet (6.25 mg total) by mouth 2 (two) times daily with a meal.   celecoxib 200 MG capsule Commonly known as:  CELEBREX Take 1 capsule (200 mg total) by mouth daily. For arthritis   DEXILANT 60 MG capsule Generic drug:  dexlansoprazole TAKE 1 CAPSULE BY MOUTH ONCE DAILY   doxycycline 100 MG tablet Commonly known as:  VIBRA-TABS Take 1 tablet (100 mg total) by mouth 2 (two) times daily.   DULoxetine 60 MG capsule Commonly known as:  CYMBALTA Take 2 capsules (120 mg total) by mouth daily.   Fish Oil 1000 MG Caps Take 1,000 mg by mouth daily.   fluticasone 50 MCG/ACT nasal spray Commonly known as:  FLONASE Place 2 sprays into both nostrils daily.   gabapentin 300 MG capsule Commonly known as:  NEURONTIN Take 1 capsule (300 mg total) by mouth 3 (three) times daily.   gemfibrozil 600 MG tablet Commonly known as:  LOPID TAKE ONE TABLET BY MOUTH TWICE DAILY BEFORE A MEAL   levothyroxine 50 MCG tablet Commonly known as:  SYNTHROID, LEVOTHROID Take 1 tablet (50 mcg total) by mouth daily.   linaclotide 290 MCG Caps capsule Commonly known as:  LINZESS Take 1 capsule (290 mcg total) by mouth daily before breakfast.   polyethylene glycol powder powder Commonly known as:  GLYCOLAX/MIRALAX Take 1 capful daily.   ranitidine 150 MG tablet Commonly known as:  ZANTAC Take 1 tablet (150 mg total) by mouth at bedtime.   sildenafil 20 MG tablet Commonly known as:  REVATIO Take 2-5 tablets daily as needed for sex   SYMBICORT 160-4.5 MCG/ACT inhaler Generic drug:  budesonide-formoterol INHALE TWO PUFFS BY MOUTH TWICE DAILY FOR COPD/ASTHMA   Testosterone 20.25 MG/ACT (1.62%) Gel Commonly known as:  ANDROGEL PUMP Apply 4 pumps daily to upper chest and shoulders   traZODone 100 MG tablet Commonly known as:  DESYREL TAKE 1 TABLET BY MOUTH AT BEDTIME   varenicline 1 MG tablet Commonly known as:  CHANTIX CONTINUING  MONTH [name] Take 1 tablet (1 mg total) by mouth daily.        Follow-up: Return in about 3 months (around 05/21/2018).  Claretta Fraise, M.D.

## 2018-02-18 NOTE — Patient Instructions (Signed)
DASH Eating Plan DASH stands for "Dietary Approaches to Stop Hypertension." The DASH eating plan is a healthy eating plan that has been shown to reduce high blood pressure (hypertension). It may also reduce your risk for type 2 diabetes, heart disease, and stroke. The DASH eating plan may also help with weight loss. What are tips for following this plan? General guidelines  Avoid eating more than 2,300 mg (milligrams) of salt (sodium) a day. If you have hypertension, you may need to reduce your sodium intake to 1,500 mg a day.  Limit alcohol intake to no more than 1 drink a day for nonpregnant women and 2 drinks a day for men. One drink equals 12 oz of beer, 5 oz of wine, or 1 oz of hard liquor.  Work with your health care provider to maintain a healthy body weight or to lose weight. Ask what an ideal weight is for you.  Get at least 30 minutes of exercise that causes your heart to beat faster (aerobic exercise) most days of the week. Activities may include walking, swimming, or biking.  Work with your health care provider or diet and nutrition specialist (dietitian) to adjust your eating plan to your individual calorie needs. Reading food labels  Check food labels for the amount of sodium per serving. Choose foods with less than 5 percent of the Daily Value of sodium. Generally, foods with less than 300 mg of sodium per serving fit into this eating plan.  To find whole grains, look for the word "whole" as the first word in the ingredient list. Shopping  Buy products labeled as "low-sodium" or "no salt added."  Buy fresh foods. Avoid canned foods and premade or frozen meals. Cooking  Avoid adding salt when cooking. Use salt-free seasonings or herbs instead of table salt or sea salt. Check with your health care provider or pharmacist before using salt substitutes.  Do not fry foods. Cook foods using healthy methods such as baking, boiling, grilling, and broiling instead.  Cook with  heart-healthy oils, such as olive, canola, soybean, or sunflower oil. Meal planning   Eat a balanced diet that includes: ? 5 or more servings of fruits and vegetables each day. At each meal, try to fill half of your plate with fruits and vegetables. ? Up to 6-8 servings of whole grains each day. ? Less than 6 oz of lean meat, poultry, or fish each day. A 3-oz serving of meat is about the same size as a deck of cards. One egg equals 1 oz. ? 2 servings of low-fat dairy each day. ? A serving of nuts, seeds, or beans 5 times each week. ? Heart-healthy fats. Healthy fats called Omega-3 fatty acids are found in foods such as flaxseeds and coldwater fish, like sardines, salmon, and mackerel.  Limit how much you eat of the following: ? Canned or prepackaged foods. ? Food that is high in trans fat, such as fried foods. ? Food that is high in saturated fat, such as fatty meat. ? Sweets, desserts, sugary drinks, and other foods with added sugar. ? Full-fat dairy products.  Do not salt foods before eating.  Try to eat at least 2 vegetarian meals each week.  Eat more home-cooked food and less restaurant, buffet, and fast food.  When eating at a restaurant, ask that your food be prepared with less salt or no salt, if possible. What foods are recommended? The items listed may not be a complete list. Talk with your dietitian about what   dietary choices are best for you. Grains Whole-grain or whole-wheat bread. Whole-grain or whole-wheat pasta. Brown rice. Oatmeal. Quinoa. Bulgur. Whole-grain and low-sodium cereals. Pita bread. Low-fat, low-sodium crackers. Whole-wheat flour tortillas. Vegetables Fresh or frozen vegetables (raw, steamed, roasted, or grilled). Low-sodium or reduced-sodium tomato and vegetable juice. Low-sodium or reduced-sodium tomato sauce and tomato paste. Low-sodium or reduced-sodium canned vegetables. Fruits All fresh, dried, or frozen fruit. Canned fruit in natural juice (without  added sugar). Meat and other protein foods Skinless chicken or turkey. Ground chicken or turkey. Pork with fat trimmed off. Fish and seafood. Egg whites. Dried beans, peas, or lentils. Unsalted nuts, nut butters, and seeds. Unsalted canned beans. Lean cuts of beef with fat trimmed off. Low-sodium, lean deli meat. Dairy Low-fat (1%) or fat-free (skim) milk. Fat-free, low-fat, or reduced-fat cheeses. Nonfat, low-sodium ricotta or cottage cheese. Low-fat or nonfat yogurt. Low-fat, low-sodium cheese. Fats and oils Soft margarine without trans fats. Vegetable oil. Low-fat, reduced-fat, or light mayonnaise and salad dressings (reduced-sodium). Canola, safflower, olive, soybean, and sunflower oils. Avocado. Seasoning and other foods Herbs. Spices. Seasoning mixes without salt. Unsalted popcorn and pretzels. Fat-free sweets. What foods are not recommended? The items listed may not be a complete list. Talk with your dietitian about what dietary choices are best for you. Grains Baked goods made with fat, such as croissants, muffins, or some breads. Dry pasta or rice meal packs. Vegetables Creamed or fried vegetables. Vegetables in a cheese sauce. Regular canned vegetables (not low-sodium or reduced-sodium). Regular canned tomato sauce and paste (not low-sodium or reduced-sodium). Regular tomato and vegetable juice (not low-sodium or reduced-sodium). Pickles. Olives. Fruits Canned fruit in a light or heavy syrup. Fried fruit. Fruit in cream or butter sauce. Meat and other protein foods Fatty cuts of meat. Ribs. Fried meat. Bacon. Sausage. Bologna and other processed lunch meats. Salami. Fatback. Hotdogs. Bratwurst. Salted nuts and seeds. Canned beans with added salt. Canned or smoked fish. Whole eggs or egg yolks. Chicken or turkey with skin. Dairy Whole or 2% milk, cream, and half-and-half. Whole or full-fat cream cheese. Whole-fat or sweetened yogurt. Full-fat cheese. Nondairy creamers. Whipped toppings.  Processed cheese and cheese spreads. Fats and oils Butter. Stick margarine. Lard. Shortening. Ghee. Bacon fat. Tropical oils, such as coconut, palm kernel, or palm oil. Seasoning and other foods Salted popcorn and pretzels. Onion salt, garlic salt, seasoned salt, table salt, and sea salt. Worcestershire sauce. Tartar sauce. Barbecue sauce. Teriyaki sauce. Soy sauce, including reduced-sodium. Steak sauce. Canned and packaged gravies. Fish sauce. Oyster sauce. Cocktail sauce. Horseradish that you find on the shelf. Ketchup. Mustard. Meat flavorings and tenderizers. Bouillon cubes. Hot sauce and Tabasco sauce. Premade or packaged marinades. Premade or packaged taco seasonings. Relishes. Regular salad dressings. Where to find more information:  National Heart, Lung, and Blood Institute: www.nhlbi.nih.gov  American Heart Association: www.heart.org Summary  The DASH eating plan is a healthy eating plan that has been shown to reduce high blood pressure (hypertension). It may also reduce your risk for type 2 diabetes, heart disease, and stroke.  With the DASH eating plan, you should limit salt (sodium) intake to 2,300 mg a day. If you have hypertension, you may need to reduce your sodium intake to 1,500 mg a day.  When on the DASH eating plan, aim to eat more fresh fruits and vegetables, whole grains, lean proteins, low-fat dairy, and heart-healthy fats.  Work with your health care provider or diet and nutrition specialist (dietitian) to adjust your eating plan to your individual   calorie needs. This information is not intended to replace advice given to you by your health care provider. Make sure you discuss any questions you have with your health care provider. Document Released: 08/29/2011 Document Revised: 09/02/2016 Document Reviewed: 09/02/2016 [employer] Interactive Patient Education  2018 [employer]  

## 2018-02-19 ENCOUNTER — Encounter: Payer: Self-pay | Admitting: *Deleted

## 2018-02-19 ENCOUNTER — Ambulatory Visit (INDEPENDENT_AMBULATORY_CARE_PROVIDER_SITE_OTHER): Payer: Medicare HMO | Admitting: *Deleted

## 2018-02-19 VITALS — BP 125/78 | HR 64 | Ht 68.5 in | Wt 335.0 lb

## 2018-02-19 DIAGNOSIS — Z0000 Encounter for general adult medical examination without abnormal findings: Secondary | ICD-10-CM | POA: Diagnosis not present

## 2018-02-19 LAB — TSH: TSH: 1.81 u[IU]/mL (ref 0.450–4.500)

## 2018-02-19 LAB — CMP14+EGFR
A/G RATIO: 1.8 (ref 1.2–2.2)
ALBUMIN: 4.4 g/dL (ref 3.5–5.5)
ALK PHOS: 82 IU/L (ref 39–117)
ALT: 16 IU/L (ref 0–44)
AST: 15 IU/L (ref 0–40)
BILIRUBIN TOTAL: 0.3 mg/dL (ref 0.0–1.2)
BUN / CREAT RATIO: 13 (ref 9–20)
BUN: 11 mg/dL (ref 6–24)
CO2: 22 mmol/L (ref 20–29)
Calcium: 9.5 mg/dL (ref 8.7–10.2)
Chloride: 98 mmol/L (ref 96–106)
Creatinine, Ser: 0.83 mg/dL (ref 0.76–1.27)
GFR calc Af Amer: 124 mL/min/{1.73_m2} (ref >59)
GFR calc non Af Amer: 107 mL/min/{1.73_m2} (ref >59)
GLOBULIN, TOTAL: 2.4 g/dL (ref 1.5–4.5)
Glucose: 104 mg/dL — ABNORMAL HIGH (ref 65–99)
POTASSIUM: 4.1 mmol/L (ref 3.5–5.2)
SODIUM: 137 mmol/L (ref 134–144)
Total Protein: 6.8 g/dL (ref 6.0–8.5)

## 2018-02-19 LAB — CBC WITH DIFFERENTIAL/PLATELET
BASOS: 0 %
Basophils Absolute: 0 10*3/uL (ref 0.0–0.2)
EOS (ABSOLUTE): 0.1 10*3/uL (ref 0.0–0.4)
EOS: 2 %
HEMATOCRIT: 38.2 % (ref 37.5–51.0)
Hemoglobin: 12.9 g/dL — ABNORMAL LOW (ref 13.0–17.7)
Immature Grans (Abs): 0 10*3/uL (ref 0.0–0.1)
Immature Granulocytes: 0 %
LYMPHS ABS: 2.1 10*3/uL (ref 0.7–3.1)
Lymphs: 32 %
MCH: 30.7 pg (ref 26.6–33.0)
MCHC: 33.8 g/dL (ref 31.5–35.7)
MCV: 91 fL (ref 79–97)
MONOS ABS: 0.6 10*3/uL (ref 0.1–0.9)
Monocytes: 9 %
NEUTROS ABS: 3.7 10*3/uL (ref 1.4–7.0)
Neutrophils: 57 %
Platelets: 245 10*3/uL (ref 150–450)
RBC: 4.2 x10E6/uL (ref 4.14–5.80)
RDW: 14.5 % (ref 12.3–15.4)
WBC: 6.5 10*3/uL (ref 3.4–10.8)

## 2018-02-19 LAB — TESTOSTERONE,FREE AND TOTAL
Testosterone, Free: 4.7 pg/mL — ABNORMAL LOW (ref 6.8–21.5)
Testosterone: 160 ng/dL — ABNORMAL LOW (ref 264–916)

## 2018-02-19 LAB — T4, FREE: FREE T4: 1.03 ng/dL (ref 0.82–1.77)

## 2018-02-19 NOTE — Patient Instructions (Addendum)
Roy [name] , Thank you for taking time to come for your Medicare Wellness Visit. I appreciate your ongoing commitment to your health goals. Please review the following plan we discussed and let me know if I can assist you in the future.   These are the goals we discussed: Goals    . DIET - DECREASE SODA OR JUICE INTAKE    . DIET - INCREASE WATER INTAKE    . Exercise 3x per week (60 min per time)     Join [employer] and attend classes 3 times per week for 1 hour.     . Plan meals     Plan 3 meals a day and try to eat earlier in the day       This is a list of the screening recommended for you and due dates:  Health Maintenance  Topic Date Due  . HIV Screening  05/24/1988  . Flu Shot  06/09/2018*  . Tetanus Vaccine  11/16/2024  *Topic was postponed. The date shown is not the original due date.    Steps to Quit Smoking Smoking tobacco can be harmful to your health and can affect almost every organ in your body. Smoking puts you, and those around you, at risk for developing many serious chronic diseases. Quitting smoking is difficult, but it is one of the best things that you can do for your health. It is never too late to quit. What are the benefits of quitting smoking? When you quit smoking, you lower your risk of developing serious diseases and conditions, such as:  Lung cancer or lung disease, such as COPD.  Heart disease.  Stroke.  Heart attack.  Infertility.  Osteoporosis and bone fractures.  Additionally, symptoms such as coughing, wheezing, and shortness of breath may get better when you quit. You may also find that you get sick less often because your body is stronger at fighting off colds and infections. If you are pregnant, quitting smoking can help to reduce your chances of having a baby of low birth weight. How do I get ready to quit? When you decide to quit smoking, create a plan to make sure that you are successful. Before you quit:  Pick a date to quit.  Set a date within the next two weeks to give you time to prepare.  Write down the reasons why you are quitting. Keep this list in places where you will see it often, such as on your bathroom mirror or in your car or wallet.  Identify the people, places, things, and activities that make you want to smoke (triggers) and avoid them. Make sure to take these actions: ? Throw away all cigarettes at home, at work, and in your car. ? Throw away smoking accessories, such as Set designer. ? Clean your car and make sure to empty the ashtray. ? Clean your home, including curtains and carpets.  Tell your family, friends, and coworkers that you are quitting. Support from your loved ones can make quitting easier.  Talk with your health care provider about your options for quitting smoking.  Find out what treatment options are covered by your health insurance.  What strategies can I use to quit smoking? Talk with your healthcare provider about different strategies to quit smoking. Some strategies include:  Quitting smoking altogether instead of gradually lessening how much you smoke over a period of time. Research shows that quitting "cold [place]" is more successful than gradually quitting.  Attending in-person counseling to help  you build problem-solving skills. You are more likely to have success in quitting if you attend several counseling sessions. Even short sessions of 10 minutes can be effective.  Finding resources and support systems that can help you to quit smoking and remain smoke-free after you quit. These resources are most helpful when you use them often. They can include: ? Online chats with a Veterinary surgeon. ? Telephone quitlines. ? Automotive engineer. ? Support groups or group counseling. ? Text messaging programs. ? Mobile phone applications.  Taking medicines to help you quit smoking. (If you are pregnant or breastfeeding, talk with your health care provider first.)  Some medicines contain nicotine and some do not. Both types of medicines help with cravings, but the medicines that include nicotine help to relieve withdrawal symptoms. Your health care provider may recommend: ? Nicotine patches, gum, or lozenges. ? Nicotine inhalers or sprays. ? Non-nicotine medicine that is taken by mouth.  Talk with your health care provider about combining strategies, such as taking medicines while you are also receiving in-person counseling. Using these two strategies together makes you more likely to succeed in quitting than if you used either strategy on its own. If you are pregnant or breastfeeding, talk with your health care provider about finding counseling or other support strategies to quit smoking. Do not take medicine to help you quit smoking unless told to do so by your health care provider. What things can I do to make it easier to quit? Quitting smoking might feel overwhelming at first, but there is a lot that you can do to make it easier. Take these important actions:  Reach out to your family and friends and ask that they support and encourage you during this time. Call telephone quitlines, reach out to support groups, or work with a counselor for support.  Ask people who smoke to avoid smoking around you.  Avoid places that trigger you to smoke, such as bars, parties, or smoke-break areas at work.  Spend time around people who do not smoke.  Lessen stress in your life, because stress can be a smoking trigger for some people. To lessen stress, try: ? Exercising regularly. ? Deep-breathing exercises. ? Yoga. ? Meditating. ? Performing a body scan. This involves closing your eyes, scanning your body from head to toe, and noticing which parts of your body are particularly tense. Purposefully relax the muscles in those areas.  Download or purchase mobile phone or tablet apps (applications) that can help you stick to your quit plan by providing reminders,  tips, and encouragement. There are many free apps, such as QuitGuide from the [employer] developer for Disease Control and Prevention). You can find other support for quitting smoking (smoking cessation) through smokefree.gov and other websites.  How will I feel when I quit smoking? Within the first 24 hours of quitting smoking, you may start to feel some withdrawal symptoms. These symptoms are usually most noticeable 2-3 days after quitting, but they usually do not last beyond 2-3 weeks. Changes or symptoms that you might experience include:  Mood swings.  Restlessness, anxiety, or irritation.  Difficulty concentrating.  Dizziness.  Strong cravings for sugary foods in addition to nicotine.  Mild weight gain.  Constipation.  Nausea.  Coughing or a sore throat.  Changes in how your medicines work in your body.  A depressed mood.  Difficulty sleeping (insomnia).  After the first 2-3 weeks of quitting, you may start to notice more positive results, such as:  Improved sense of  smell and taste.  Decreased coughing and sore throat.  Slower heart rate.  Lower blood pressure.  Clearer skin.  The ability to breathe more easily.  Fewer sick days.  Quitting smoking is very challenging for most people. Do not get discouraged if you are not successful the first time. Some people need to make many attempts to quit before they achieve long-term success. Do your best to stick to your quit plan, and talk with your health care provider if you have any questions or concerns. This information is not intended to replace advice given to you by your health care provider. Make sure you discuss any questions you have with your health care provider. Document Released: 09/03/2001 Document Revised: 05/07/2016 Document Reviewed: 01/24/2015 Elsevier Interactive Patient Education  [employer].

## 2018-02-19 NOTE — Progress Notes (Addendum)
Subjective:   Roy [name] is a 47 y.o. male who presents for a Medicare Annual Wellness Visit. Roy [name] lives at home with his wife, daughter, and mother.    Review of Systems    Patient reports that her health is unchanged compared to last year.  Cardiac Risk Factors include: dyslipidemia;smoking/ tobacco exposure;male gender;hypertension;sedentary lifestyle;obesity (BMI >30kg/m2)  Other systems negative unless otherwise noted in other area       Current Medications (verified) Outpatient Encounter Medications as of 02/19/2018  Medication Sig  . acetaminophen (TYLENOL) 500 MG tablet Take 1,000 mg by mouth every 6 (six) hours as needed for headache (pain).   Marland Kitchen albuterol (PROVENTIL HFA;VENTOLIN HFA) 108 (90 Base) MCG/ACT inhaler Inhale 1-2 puffs into the lungs every 6 (six) hours as needed for wheezing or shortness of breath.  . ALPRAZolam (XANAX) 0.5 MG tablet TAKE 1 TABLET BY MOUTH THREE TIMES DAILY AS NEEDED FOR  ANXIETY/PANIC  . aspirin EC 81 MG tablet Take 81 mg by mouth daily.  . carvedilol (COREG) 6.25 MG tablet Take 1 tablet (6.25 mg total) by mouth 2 (two) times daily with a meal.  . celecoxib (CELEBREX) 200 MG capsule Take 1 capsule (200 mg total) by mouth daily. For arthritis  . DEXILANT 60 MG capsule TAKE 1 CAPSULE BY MOUTH ONCE DAILY  . doxycycline (VIBRA-TABS) 100 MG tablet Take 1 tablet (100 mg total) by mouth 2 (two) times daily.  . DULoxetine (CYMBALTA) 60 MG capsule Take 2 capsules (120 mg total) by mouth daily.  . fluticasone (FLONASE) 50 MCG/ACT nasal spray Place 2 sprays into both nostrils daily.  Marland Kitchen gabapentin (NEURONTIN) 300 MG capsule Take 1 capsule (300 mg total) by mouth 3 (three) times daily.  Marland Kitchen gemfibrozil (LOPID) 600 MG tablet TAKE ONE TABLET BY MOUTH TWICE DAILY BEFORE A MEAL (Patient taking differently: Take 600 mg by mouth 2 (two) times daily. )  . levothyroxine (SYNTHROID, LEVOTHROID) 50 MCG tablet Take 1 tablet (50 mcg total) by mouth daily.    Marland Kitchen linaclotide (LINZESS) 290 MCG CAPS capsule Take 1 capsule (290 mcg total) by mouth daily before breakfast.  . Omega-3 Fatty Acids (FISH OIL) 1000 MG CAPS Take 1,000 mg by mouth daily.   . polyethylene glycol powder (GLYCOLAX/MIRALAX) powder Take 1 capful daily.  . ranitidine (ZANTAC) 150 MG tablet Take 1 tablet (150 mg total) by mouth at bedtime.  . sildenafil (REVATIO) 20 MG tablet Take 2-5 tablets daily as needed for sex  . SYMBICORT 160-4.5 MCG/ACT inhaler INHALE TWO PUFFS BY MOUTH TWICE DAILY FOR COPD/ASTHMA  . Testosterone (ANDROGEL PUMP) 20.25 MG/ACT (1.62%) GEL Apply 4 pumps daily to upper chest and shoulders (Patient taking differently: Place 81 mg onto the skin See admin instructions. Apply 4 pumps (81 mg) daily to upper chest and shoulders)  . traZODone (DESYREL) 100 MG tablet TAKE 1 TABLET BY MOUTH AT BEDTIME  . varenicline (CHANTIX CONTINUING MONTH PAK) 1 MG tablet Take 1 tablet (1 mg total) by mouth daily.   No facility-administered encounter medications on file as of 02/19/2018.     Allergies (verified) Penicillins and Chantix [varenicline]   History: Past Medical History:  Diagnosis Date  . Anxiety   . Depression   . Hypertension   . Low testosterone   . Median nerve dysfunction   . Radiculopathy of cervical region   . Thyroid disease   . Ulnar neuropathy at elbow   . Weakness    Past Surgical History:  Procedure Laterality  Date  . BICEPS TENDON REPAIR    . NECK SURGERY     Fusion done two seperate times  . ROTATOR CUFF REPAIR Right    Family History  Problem Relation Age of Onset  . Hypertension Mother   . Stroke Mother 81  . Arrhythmia Father   . COPD Father   . Asthma Brother   . Bipolar disorder Brother   . Asthma Daughter   . Fibromyalgia Sister    Social History   Socioeconomic History  . Marital status: Married    Spouse name: Not on file  . Number of children: 1  . Years of education: GED  . Highest education level: GED or equivalent   Occupational History  . Occupation: Disability    Comment: Worked as Arboriculturist  . Financial resource strain: Not hard at all  . Food insecurity:    Worry: Never true    Inability: Never true  . Transportation needs:    Medical: No    Non-medical: No  Tobacco Use  . Smoking status: Former Smoker    Packs/day: 0.50    Years: 30.00    Pack years: 15.00    Types: Cigarettes    Last attempt to quit: 07/23/2017    Years since quitting: 0.5  . Smokeless tobacco: Former Neurosurgeon    Types: Snuff, Chew  Substance and Sexual Activity  . Alcohol use: No  . Drug use: No  . Sexual activity: Yes  Lifestyle  . Physical activity:    Days per week: Not on file    Minutes per session: Not on file  . Stress: Not on file  Relationships  . Social connections:    Talks on phone: Not on file    Gets together: Not on file    Attends religious service: Not on file    Active member of club or organization: Not on file    Attends meetings of clubs or organizations: Not on file    Relationship status: Not on file  Other Topics Concern  . Not on file  Social History Narrative  . Not on file    Tobacco Counseling Discussed options for quitting. He is taking chantix. Smoking cessation handout given and discussed.  Replace smoking with a healthy habit like walking, exercise, gum, snack on crispy vegetables   Clinical Intake: Nutritional Status: BMI > 30  Obese Diabetes: No  How often do you need to have someone help you when you read instructions, pamphlets, or other written materials from your doctor or pharmacy?: 1 - Never What is the last grade level you completed in school?: 8th and GED  Interpreter Needed?: No  Information entered by :: Demetrios Loll, RN  Activities of Daily Living In your present state of health, do you have any difficulty performing the following activities: 02/19/2018 10/22/2017  Hearing? N N  Vision? N Y  Comment - Due for eye exam, will  set up in the next few weeks  Difficulty concentrating or making decisions? N N  Walking or climbing stairs? N N  Dressing or bathing? N N  Doing errands, shopping? N N  Preparing Food and eating ? N N  Using the Toilet? N N  In the past six months, have you accidently leaked urine? N N  Do you have problems with loss of bowel control? N N  Managing your Medications? N N  Managing your Finances? N N  Housekeeping or managing your Housekeeping? N N  Some recent data might be hidden     Immunizations and Health Maintenance Immunization History  Administered Date(s) Administered  . Tdap 11/16/2014   Health Maintenance Due  Topic Date Due  . HIV Screening  05/24/1988     Diet Coffee first thing in the morning. Drinks sweet tea and mountain dew throughout the day. Very little to no water. Eats sandwich or chips around 1:00 or 2:00 for lunch Not hungry until 9:00 or 10:00 at night  Exercise Current Exercise Habits: The patient does not participate in regular exercise at present, Exercise limited by: orthopedic condition(s);neurologic condition(s);respiratory conditions(s)    Depression Screen PHQ 2/9 Scores 02/19/2018 02/18/2018 12/23/2017 12/16/2017  PHQ - 2 Score 2 4 0 0  PHQ- 9 Score 5 6 - -  Exception Documentation - - - -     Fall Risk Fall Risk  02/19/2018 02/18/2018 12/23/2017 12/16/2017 11/07/2017  Falls in the past year? No No No No No    Safety Is the patient's home free of loose throw rugs in walkways, pet beds, electrical cords, etc?   yes      Grab bars in the bathroom? no      Walkin shower? no      Shower Seat? no      Handrails on the stairs?   yes      Adequate lighting?   yes  Patient Care Team: Mechele Claude, MD as PCP - General (Family Medicine)  ER visit for chest pain. No hospitalizations or surgeries. Objective:    Today's Vitals   02/19/18 1114  BP: 125/78  Pulse: 64  Weight: (!) 335 lb (152 kg)  Height: 5' 8.5" (1.74 m)   Body mass index  is 50.2 kg/m.  Advanced Directives 10/22/2017 10/20/2017 07/05/2017 12/20/2015 11/01/2014  Does Patient Have a Medical Advance Directive? No No No No No  Would patient like information on creating a medical advance directive? Yes (ED - Information included in AVS) No - Patient declined - No - patient declined information -    Hearing/Vision  normal or No deficits noted during visit.   Cognitive Function: MMSE - Mini Mental State Exam 10/22/2017  Orientation to time 5  Orientation to Place 5  Registration 3  Attention/ Calculation 3  Recall 3  Language- name 2 objects 2  Language- repeat 1  Language- follow 3 step command 3  Language- read & follow direction 1  Write a sentence 1  Copy design 1  Total score 28       Normal Cognitive Function Screening: Yes      Assessment:   This is a routine wellness examination for Majid.      Plan:    Goals    . DIET - DECREASE SODA OR JUICE INTAKE    . DIET - INCREASE WATER INTAKE    . Exercise 3x per week (60 min per time)     Join [employer] and attend classes 3 times per week for 1 hour.     . Plan meals     Plan 3 meals a day and try to eat earlier in the day       Keep f/u with Mechele Claude, MD and any other specialty appointments you may have Continue current medications Move carefully to avoid falls. Use assistive devices like a can or walker if needed. Aim for at least 150 minutes of moderate activity a week. This can be chair exercises if necessary. Reading or puzzles are a good  way to exercise your brain Stay connected with friends and family. Social connections are beneficial to your emotional and mental health.   Review and return a signed, witnessed, and notarized copy of Advance Directives if given.  Health Maintenance: Tdap Vaccine recommended: no Zostavax (Shingles vaccine) recommended:not applicable Prevnar or Pneumovax (pneumonia vaccines) recommended:not applicable  Cancer Screenings: Lung:  Low Dose CT Chest recommended if Age 66-80 years, 30 pack-year currently smoking OR have quit w/in 15years. Patient does not qualify. Colon cancer screening recommended: not applicable  Additional Screenings Hepatitis C Screening recommended: not applicable Dexa Scan recommended: not applicable Diabetic Eye Exam recommended: not applicable  No orders of the defined types were placed in this encounter.   I have personally reviewed and noted the following in the patient's chart:   . Medical and social history . Use of alcohol, tobacco or illicit drugs  . Current medications and supplements . Functional ability and status . Nutritional status . Physical activity . Advanced directives . List of other physicians . Hospitalizations, surgeries, and ER visits in previous 12 months . Vitals . Screenings to include cognitive, depression, and falls . Referrals and appointments  In addition, I have reviewed and discussed with patient certain preventive protocols, quality metrics, and best practice recommendations. A written personalized care plan for preventive services as well as general preventive health recommendations were provided to patient.     Demetrios Loll, RN   02/19/2018    I have reviewed and agree with the above AWV documentation.  Mechele Claude, M.D.

## 2018-02-22 ENCOUNTER — Encounter: Payer: Self-pay | Admitting: Family Medicine

## 2018-02-23 ENCOUNTER — Other Ambulatory Visit: Payer: Self-pay | Admitting: *Deleted

## 2018-02-23 ENCOUNTER — Other Ambulatory Visit: Payer: Self-pay | Admitting: Family Medicine

## 2018-02-23 DIAGNOSIS — N529 Male erectile dysfunction, unspecified: Secondary | ICD-10-CM

## 2018-02-24 ENCOUNTER — Other Ambulatory Visit: Payer: Medicare HMO

## 2018-02-24 DIAGNOSIS — N529 Male erectile dysfunction, unspecified: Secondary | ICD-10-CM

## 2018-02-25 LAB — TESTOSTERONE,FREE AND TOTAL
Testosterone, Free: 9.1 pg/mL (ref 6.8–21.5)
Testosterone: 349 ng/dL (ref 264–916)

## 2018-03-25 ENCOUNTER — Other Ambulatory Visit: Payer: Self-pay | Admitting: Family Medicine

## 2018-03-27 ENCOUNTER — Telehealth: Payer: Self-pay | Admitting: Family Medicine

## 2018-03-27 ENCOUNTER — Other Ambulatory Visit: Payer: Self-pay | Admitting: Family Medicine

## 2018-03-27 NOTE — Telephone Encounter (Signed)
Has already been done.  

## 2018-04-10 DIAGNOSIS — Z029 Encounter for administrative examinations, unspecified: Secondary | ICD-10-CM

## 2018-04-27 ENCOUNTER — Other Ambulatory Visit: Payer: Self-pay | Admitting: Family Medicine

## 2018-05-19 ENCOUNTER — Other Ambulatory Visit: Payer: Self-pay | Admitting: Family Medicine

## 2018-06-26 ENCOUNTER — Other Ambulatory Visit: Payer: Self-pay | Admitting: Nurse Practitioner

## 2018-07-01 ENCOUNTER — Ambulatory Visit (INDEPENDENT_AMBULATORY_CARE_PROVIDER_SITE_OTHER): Payer: Medicare HMO | Admitting: Family Medicine

## 2018-07-01 ENCOUNTER — Encounter: Payer: Self-pay | Admitting: Family Medicine

## 2018-07-01 VITALS — BP 123/78 | HR 54 | Temp 97.3°F | Ht 68.5 in | Wt 349.2 lb

## 2018-07-01 DIAGNOSIS — K219 Gastro-esophageal reflux disease without esophagitis: Secondary | ICD-10-CM | POA: Diagnosis not present

## 2018-07-01 DIAGNOSIS — E039 Hypothyroidism, unspecified: Secondary | ICD-10-CM

## 2018-07-01 DIAGNOSIS — E038 Other specified hypothyroidism: Secondary | ICD-10-CM

## 2018-07-01 DIAGNOSIS — M5416 Radiculopathy, lumbar region: Secondary | ICD-10-CM

## 2018-07-01 DIAGNOSIS — E349 Endocrine disorder, unspecified: Secondary | ICD-10-CM

## 2018-07-01 DIAGNOSIS — I10 Essential (primary) hypertension: Secondary | ICD-10-CM | POA: Diagnosis not present

## 2018-07-01 DIAGNOSIS — J40 Bronchitis, not specified as acute or chronic: Secondary | ICD-10-CM | POA: Diagnosis not present

## 2018-07-01 DIAGNOSIS — M5412 Radiculopathy, cervical region: Secondary | ICD-10-CM | POA: Diagnosis not present

## 2018-07-01 DIAGNOSIS — Z23 Encounter for immunization: Secondary | ICD-10-CM | POA: Diagnosis not present

## 2018-07-01 MED ORDER — GEMFIBROZIL 600 MG PO TABS: 600.0000 mg | ORAL_TABLET | Freq: Two times a day (BID) | ORAL | 5 refills | Status: DC

## 2018-07-01 MED ORDER — ALBUTEROL SULFATE HFA 108 (90 BASE) MCG/ACT IN AERS: 1.0000 | INHALATION_SPRAY | Freq: Four times a day (QID) | RESPIRATORY_TRACT | 2 refills | Status: DC | PRN

## 2018-07-01 MED ORDER — DEXLANSOPRAZOLE 60 MG PO CPDR: 1.0000 | DELAYED_RELEASE_CAPSULE | Freq: Every day | ORAL | 1 refills | Status: DC

## 2018-07-01 MED ORDER — TESTOSTERONE 20.25 MG/ACT (1.62%) TD GEL: TRANSDERMAL | 5 refills | Status: DC

## 2018-07-01 MED ORDER — SILDENAFIL CITRATE 20 MG PO TABS: ORAL_TABLET | ORAL | 1 refills | Status: DC

## 2018-07-01 MED ORDER — ALPRAZOLAM 0.5 MG PO TABS: 0.5000 mg | ORAL_TABLET | Freq: Three times a day (TID) | ORAL | 5 refills | Status: DC | PRN

## 2018-07-01 MED ORDER — BUDESONIDE-FORMOTEROL FUMARATE 160-4.5 MCG/ACT IN AERO: INHALATION_SPRAY | RESPIRATORY_TRACT | 3 refills | Status: DC

## 2018-07-01 MED ORDER — LEVOTHYROXINE SODIUM 50 MCG PO TABS: 50.0000 ug | ORAL_TABLET | Freq: Every day | ORAL | 5 refills | Status: DC

## 2018-07-01 MED ORDER — TRAZODONE HCL 100 MG PO TABS: 100.0000 mg | ORAL_TABLET | Freq: Every day | ORAL | 5 refills | Status: DC

## 2018-07-01 MED ORDER — GABAPENTIN 300 MG PO CAPS: 300.0000 mg | ORAL_CAPSULE | Freq: Three times a day (TID) | ORAL | 1 refills | Status: DC

## 2018-07-01 MED ORDER — CARVEDILOL 6.25 MG PO TABS: 6.2500 mg | ORAL_TABLET | Freq: Two times a day (BID) | ORAL | 1 refills | Status: DC

## 2018-07-01 MED ORDER — CELECOXIB 200 MG PO CAPS: 200.0000 mg | ORAL_CAPSULE | Freq: Every day | ORAL | 5 refills | Status: DC

## 2018-07-01 MED ORDER — LINACLOTIDE 290 MCG PO CAPS: 290.0000 ug | ORAL_CAPSULE | Freq: Every day | ORAL | 1 refills | Status: DC

## 2018-07-01 MED ORDER — DULOXETINE HCL 60 MG PO CPEP: 120.0000 mg | ORAL_CAPSULE | Freq: Every day | ORAL | 1 refills | Status: DC

## 2018-07-01 NOTE — Progress Notes (Signed)
Subjective:  Patient ID: Roy [name], male    DOB: 1971/03/16  Age: 47 y.o. MRN: [id number]  CC: Medical Management of Chronic Issues (xanax and trazadone refills)   HPI Roy [name] presents for follow-up on his chronic medical concerns.  Primarily he has chronic anxiety.  He continues to take Xanax regularly.  Is also due for refills on his trazodone.  Additionally he takes testosterone gel and needs that refilled Pt. Using medication as directed. Denies any sx referrable to DVT such as edema or erythema of legs. No dyspnea or chest pain. Energy level reported as being good. Libido is normal and denies E.D. Feels strength is adequate and improved from baseline.  He denies any symptoms regarding his thyroid.  Most recent level was in the normal range.   Depression screen [facility] 2/9 07/01/2018 02/19/2018 02/18/2018  Decreased Interest 0 1 2  Down, Depressed, Hopeless 0 1 2  PHQ - 2 Score 0 2 4  Altered sleeping - 0 0  Tired, decreased energy - 2 2  Change in appetite - - 0  Feeling bad or failure about yourself  - 1 0  Trouble concentrating - 0 0  Moving slowly or fidgety/restless - 0 0  Suicidal thoughts - 0 0  PHQ-9 Score - 5 6  Difficult doing work/chores - Somewhat difficult -  Some recent data might be hidden    History Roy [name] has a past medical history of Anxiety, Depression, Hypertension, Low testosterone, Median nerve dysfunction, Radiculopathy of cervical region, Thyroid disease, Ulnar neuropathy at elbow, and Weakness.   He has a past surgical history that includes Rotator cuff repair (Right); Biceps tendon repair; and Neck surgery.   His family history includes Arrhythmia in his father; Asthma in his brother and daughter; Bipolar disorder in his brother; COPD in his father; Fibromyalgia in his sister; Hypertension in his mother; Stroke (age of onset: 42) in his mother.He reports that he quit smoking about 11 months ago. His smoking use included cigarettes. He has a 15.00  pack-year smoking history. He has quit using smokeless tobacco.  His smokeless tobacco use included snuff and chew. He reports that he does not drink alcohol or use drugs.    ROS Review of Systems  Constitutional: Negative.   HENT: Negative.   Eyes: Negative for visual disturbance.  Respiratory: Negative for cough and shortness of breath.   Cardiovascular: Negative for chest pain and leg swelling.  Gastrointestinal: Negative for abdominal pain, diarrhea, nausea and vomiting.  Genitourinary: Negative for difficulty urinating.  Musculoskeletal: Positive for arthralgias, back pain and neck pain. Negative for myalgias.  Skin: Negative for rash.  Neurological: Negative for headaches.  Psychiatric/Behavioral: Negative for sleep disturbance.    Objective:  BP 123/78   Pulse (!) 54   Temp (!) 97.3 F (36.3 C)   Ht 5' 8.5" (1.74 m)   Wt (!) 349 lb 3.2 oz (158.4 kg)   BMI 52.32 kg/m   BP Readings from Last 3 Encounters:  07/01/18 123/78  02/19/18 125/78  02/18/18 115/67    Wt Readings from Last 3 Encounters:  07/01/18 (!) 349 lb 3.2 oz (158.4 kg)  02/19/18 (!) 335 lb (152 kg)  02/18/18 (!) 334 lb 6 oz (151.7 kg)     Physical Exam  Constitutional: He is oriented to person, place, and time. He appears well-developed and well-nourished. No distress.  HENT:  Head: Normocephalic and atraumatic.  Right Ear: External ear normal.  Left Ear: External ear normal.  Nose: Nose normal.  Mouth/Throat: Oropharynx is clear and moist.  Eyes: Pupils are equal, round, and reactive to light. Conjunctivae and EOM are normal.  Neck: Normal range of motion. Neck supple.  Cardiovascular: Normal rate, regular rhythm and normal heart sounds.  No murmur heard. Pulmonary/Chest: Effort normal and breath sounds normal. No respiratory distress. He has no wheezes. He has no rales.  Abdominal: Soft. There is no tenderness.  Musculoskeletal: Normal range of motion.  Neurological: He is alert and  oriented to person, place, and time. He has normal reflexes.  Skin: Skin is warm and dry.  Psychiatric: He has a normal mood and affect. His behavior is normal. Judgment and thought content normal.      Assessment & Plan:   Roy [name] was seen today for medical management of chronic issues.  Diagnoses and all orders for this visit:  Benign essential HTN -     CMP14+EGFR -     PSA, total and free -     VITAMIN D 25 Hydroxy (Vit-D Deficiency, Fractures) -     Testosterone,Free and Total  Gastroesophageal reflux disease, esophagitis presence not specified  Hypotestosteronism -     Testosterone (ANDROGEL PUMP) 20.25 MG/ACT (1.62%) GEL; Apply 4 pumps daily to upper chest and shoulders  Cervical nerve root disorder  Other specified hypothyroidism  Lumbar radiculopathy -     ToxASSURE Select 13 (MW), Urine -     DULoxetine (CYMBALTA) 60 MG capsule; Take 2 capsules (120 mg total) by mouth daily.  Morbid obesity (HCC)  Bronchitis -     albuterol (PROVENTIL HFA;VENTOLIN HFA) 108 (90 Base) MCG/ACT inhaler; Inhale 1-2 puffs into the lungs every 6 (six) hours as needed for wheezing or shortness of breath.  Hypothyroidism, unspecified type -     levothyroxine (SYNTHROID, LEVOTHROID) 50 MCG tablet; Take 1 tablet (50 mcg total) by mouth daily.  Other orders -     carvedilol (COREG) 6.25 MG tablet; Take 1 tablet (6.25 mg total) by mouth 2 (two) times daily with a meal. -     dexlansoprazole (DEXILANT) 60 MG capsule; Take 1 capsule (60 mg total) by mouth daily. -     celecoxib (CELEBREX) 200 MG capsule; Take 1 capsule (200 mg total) by mouth daily. For arthritis -     gabapentin (NEURONTIN) 300 MG capsule; Take 1 capsule (300 mg total) by mouth 3 (three) times daily. -     gemfibrozil (LOPID) 600 MG tablet; Take 1 tablet (600 mg total) by mouth 2 (two) times daily. -     linaclotide (LINZESS) 290 MCG CAPS capsule; Take 1 capsule (290 mcg total) by mouth daily before breakfast. -      sildenafil (REVATIO) 20 MG tablet; Take 2-5 tablets daily as needed for sex -     budesonide-formoterol (SYMBICORT) 160-4.5 MCG/ACT inhaler; INHALE TWO PUFFS BY MOUTH TWICE DAILY FOR COPD/ASTHMA -     ALPRAZolam (XANAX) 0.5 MG tablet; Take 1 tablet (0.5 mg total) by mouth 3 (three) times daily as needed for anxiety.       I have discontinued Roy [name]. Roy [name]'s fluticasone, aspirin EC, ranitidine, doxycycline, and gemfibrozil. I have changed his DEXILANT to dexlansoprazole and SYMBICORT to budesonide-formoterol. I have also changed his gemfibrozil and ALPRAZolam. Additionally, I am having him maintain his Fish Oil, acetaminophen, polyethylene glycol powder, traZODone, varenicline, albuterol, levothyroxine, DULoxetine, carvedilol, celecoxib, gabapentin, linaclotide, sildenafil, and Testosterone.  Allergies as of 07/01/2018      Reactions   Penicillins Rash, Other (See Comments)  Has patient had a PCN reaction causing immediate rash, facial/tongue/throat swelling, SOB or lightheadedness with hypotension: Yes Has patient had a PCN reaction causing severe rash involving mucus membranes or skin necrosis: No Has patient had a PCN reaction that required hospitalization: No Has patient had a PCN reaction occurring within the last 10 years: No If all of the above answers are "NO", then may proceed with Cephalosporin use. Has patient had a PCN reaction causing immediate rash, facial/tongue/throat swelling, SOB or lightheadedness with hypotension: Yes Has patient had a PCN reaction causing severe rash involving mucus membranes or skin necrosis: No Has patient had a PCN reaction that required hospitalization: No Has patient had a PCN reaction occurring within the last 10 years: No If all of the above answers are "NO", then may proceed with Cephalosporin use. unknown   Chantix [varenicline] Other (See Comments)   Severe headache      Medication List        Accurate as of 07/01/18 10:38 AM. Always  use your most recent med list.          acetaminophen 500 MG tablet Commonly known as:  TYLENOL Take 1,000 mg by mouth every 6 (six) hours as needed for headache (pain).   albuterol 108 (90 Base) MCG/ACT inhaler Commonly known as:  PROVENTIL HFA;VENTOLIN HFA Inhale 1-2 puffs into the lungs every 6 (six) hours as needed for wheezing or shortness of breath.   ALPRAZolam 0.5 MG tablet Commonly known as:  XANAX Take 1 tablet (0.5 mg total) by mouth 3 (three) times daily as needed for anxiety.   budesonide-formoterol 160-4.5 MCG/ACT inhaler Commonly known as:  SYMBICORT INHALE TWO PUFFS BY MOUTH TWICE DAILY FOR COPD/ASTHMA   carvedilol 6.25 MG tablet Commonly known as:  COREG Take 1 tablet (6.25 mg total) by mouth 2 (two) times daily with a meal.   celecoxib 200 MG capsule Commonly known as:  CELEBREX Take 1 capsule (200 mg total) by mouth daily. For arthritis   dexlansoprazole 60 MG capsule Commonly known as:  DEXILANT Take 1 capsule (60 mg total) by mouth daily.   DULoxetine 60 MG capsule Commonly known as:  CYMBALTA Take 2 capsules (120 mg total) by mouth daily.   Fish Oil 1000 MG Caps Take 1,000 mg by mouth daily.   gabapentin 300 MG capsule Commonly known as:  NEURONTIN Take 1 capsule (300 mg total) by mouth 3 (three) times daily.   gemfibrozil 600 MG tablet Commonly known as:  LOPID Take 1 tablet (600 mg total) by mouth 2 (two) times daily.   levothyroxine 50 MCG tablet Commonly known as:  SYNTHROID, LEVOTHROID Take 1 tablet (50 mcg total) by mouth daily.   linaclotide 290 MCG Caps capsule Commonly known as:  LINZESS Take 1 capsule (290 mcg total) by mouth daily before breakfast.   polyethylene glycol powder powder Commonly known as:  GLYCOLAX/MIRALAX Take 1 capful daily.   sildenafil 20 MG tablet Commonly known as:  REVATIO Take 2-5 tablets daily as needed for sex   Testosterone 20.25 MG/ACT (1.62%) Gel Apply 4 pumps daily to upper chest and  shoulders   traZODone 100 MG tablet Commonly known as:  DESYREL TAKE 1 TABLET BY MOUTH AT BEDTIME   varenicline 1 MG tablet Commonly known as:  CHANTIX Take 1 tablet (1 mg total) by mouth daily.      All of the above were refilled for a total of 67-monthsupply.  Follow-up: Return in about 6 months (around 12/31/2018).  WCletus Gash  Arsema Tusing, M.D.

## 2018-07-02 LAB — CMP14+EGFR
ALT: 15 IU/L (ref 0–44)
AST: 12 IU/L (ref 0–40)
Albumin/Globulin Ratio: 1.9 (ref 1.2–2.2)
Albumin: 4.2 g/dL (ref 3.5–5.5)
Alkaline Phosphatase: 67 IU/L (ref 39–117)
BUN/Creatinine Ratio: 16 (ref 9–20)
BUN: 13 mg/dL (ref 6–24)
Bilirubin Total: 0.2 mg/dL (ref 0.0–1.2)
CALCIUM: 9.3 mg/dL (ref 8.7–10.2)
CO2: 22 mmol/L (ref 20–29)
CREATININE: 0.8 mg/dL (ref 0.76–1.27)
Chloride: 98 mmol/L (ref 96–106)
GFR, EST AFRICAN AMERICAN: 125 mL/min/{1.73_m2} (ref >59)
GFR, EST NON AFRICAN AMERICAN: 108 mL/min/{1.73_m2} (ref >59)
GLOBULIN, TOTAL: 2.2 g/dL (ref 1.5–4.5)
Glucose: 97 mg/dL (ref 65–99)
Potassium: 4.2 mmol/L (ref 3.5–5.2)
Sodium: 136 mmol/L (ref 134–144)
TOTAL PROTEIN: 6.4 g/dL (ref 6.0–8.5)

## 2018-07-02 LAB — PSA, TOTAL AND FREE
PSA FREE PCT: 23.3 %
PSA, Free: 0.07 ng/mL
Prostate Specific Ag, Serum: 0.3 ng/mL (ref 0.0–4.0)

## 2018-07-02 LAB — TESTOSTERONE,FREE AND TOTAL
TESTOSTERONE: 186 ng/dL — AB (ref 264–916)
Testosterone, Free: 3.7 pg/mL — ABNORMAL LOW (ref 6.8–21.5)

## 2018-07-02 LAB — VITAMIN D 25 HYDROXY (VIT D DEFICIENCY, FRACTURES): Vit D, 25-Hydroxy: 29.9 ng/mL — ABNORMAL LOW (ref 30.0–100.0)

## 2018-07-03 ENCOUNTER — Other Ambulatory Visit: Payer: Self-pay | Admitting: Family Medicine

## 2018-07-04 LAB — TOXASSURE SELECT 13 (MW), URINE

## 2018-07-07 ENCOUNTER — Encounter: Payer: Self-pay | Admitting: *Deleted

## 2018-07-15 DIAGNOSIS — M75101 Unspecified rotator cuff tear or rupture of right shoulder, not specified as traumatic: Secondary | ICD-10-CM | POA: Diagnosis not present

## 2018-07-15 DIAGNOSIS — R52 Pain, unspecified: Secondary | ICD-10-CM | POA: Diagnosis not present

## 2018-07-20 ENCOUNTER — Ambulatory Visit (INDEPENDENT_AMBULATORY_CARE_PROVIDER_SITE_OTHER): Payer: Medicare HMO | Admitting: Family Medicine

## 2018-07-20 ENCOUNTER — Encounter: Payer: Self-pay | Admitting: Family Medicine

## 2018-07-20 VITALS — BP 129/78 | HR 59 | Temp 97.0°F | Ht 68.5 in | Wt 346.0 lb

## 2018-07-20 DIAGNOSIS — K2100 Gastro-esophageal reflux disease with esophagitis, without bleeding: Secondary | ICD-10-CM

## 2018-07-20 MED ORDER — FAMOTIDINE 20 MG PO TABS: 20.0000 mg | ORAL_TABLET | Freq: Two times a day (BID) | ORAL | 0 refills | Status: DC

## 2018-07-20 NOTE — Patient Instructions (Signed)
Esophagitis Esophagitis is inflammation of the esophagus. The esophagus is the tube that carries food and liquids from your mouth to your stomach. Esophagitis can cause soreness or pain in the esophagus. This condition can make it difficult and painful to swallow. What are the causes? Most causes of esophagitis are not serious. Common causes of this condition include:  Gastroesophageal reflux disease (GERD). This is when stomach contents move back up into the esophagus (reflux).  Repeated vomiting.  An allergic-type reaction, especially caused by food allergies (eosinophilic esophagitis).  Injury to the esophagus by swallowing large pills with or without water, or swallowing certain types of medicines.  Swallowing (ingesting) harmful chemicals, such as household cleaning products.  Heavy alcohol use.  An infection of the esophagus.This most often occurs in people who have a weakened immune system.  Radiation or chemotherapy treatment for cancer.  Certain diseases such as sarcoidosis, Crohn disease, and scleroderma.  What are the signs or symptoms? Symptoms of this condition include:  Difficult or painful swallowing.  Pain with swallowing acidic liquids, such as citrus juices.  Pain with burping.  Chest pain.  Difficulty breathing.  Nausea.  Vomiting.  Pain in the abdomen.  Weight loss.  Ulcers in the mouth.  Patches of white material in the mouth (candidiasis).  Fever.  Coughing up blood or vomiting blood.  Stool that is black, tarry, or bright red.  How is this diagnosed? Your health care provider will take a medical history and perform a physical exam. You may also have other tests, including:  An endoscopy to examine your stomach and esophagus with a small camera.  A test that measures the acidity level in your esophagus.  A test that measures how much pressure is on your esophagus.  A barium swallow or modified barium swallow to show the shape,  size, and functioning of your esophagus.  Allergy tests.  How is this treated? Treatment for this condition depends on the cause of your esophagitis. In some cases, steroids or other medicines may be given to help relieve your symptoms or to treat the underlying cause of your condition. You may have to make some lifestyle changes, such as:  Avoiding alcohol.  Quitting smoking.  Changing your diet.  Exercising.  Changing your sleep habits and your sleep environment.  Follow these instructions at home: Take these actions to decrease your discomfort and to help avoid complications. Diet  Follow a diet as recommended by your health care provider. This may involve avoiding foods and drinks such as: ? Coffee and tea (with or without caffeine). ? Drinks that contain alcohol. ? Energy drinks and sports drinks. ? Carbonated drinks or sodas. ? Chocolate and cocoa. ? Peppermint and mint flavorings. ? Garlic and onions. ? Horseradish. ? Spicy and acidic foods, including peppers, chili powder, curry powder, vinegar, hot sauces, and barbecue sauce. ? Citrus fruit juices and citrus fruits, such as oranges, lemons, and limes. ? Tomato-based foods, such as red sauce, chili, salsa, and pizza with red sauce. ? Fried and fatty foods, such as donuts, french fries, potato chips, and high-fat dressings. ? High-fat meats, such as hot dogs and fatty cuts of red and white meats, such as rib eye steak, sausage, ham, and bacon. ? High-fat dairy items, such as whole milk, butter, and cream cheese.  Eat small, frequent meals instead of large meals.  Avoid drinking large amounts of liquid with your meals.  Avoid eating meals during the 2-3 hours before bedtime.  Avoid lying down right   after you eat.  Do not exercise right after you eat.  Avoid foods and drinks that seem to make your symptoms worse. General instructions  Pay attention to any changes in your symptoms.  Take over-the-counter and  prescription medicines only as told by your health care provider. Do not take aspirin, ibuprofen, or other NSAIDs unless your health care provider told you to do so.  If you have trouble taking pills, use a pill splitter to decrease the size of the pill. This will decrease the chance of the pill getting stuck or injuring your esophagus on the way down. Also, drink water after you take a pill.  Do not use any tobacco products, including cigarettes, chewing tobacco, and e-cigarettes. If you need help quitting, ask your health care provider.  Wear loose-fitting clothing. Do not wear anything tight around your waist that causes pressure on your abdomen.  Raise (elevate) the head of your bed about 6 inches (15 cm).  Try to reduce your stress, such as with yoga or meditation. If you need help reducing stress, ask your health care provider.  If you are overweight, reduce your weight to an amount that is healthy for you. Ask your health care provider for guidance about a safe weight loss goal.  Keep all follow-up visits as told by your health care provider. This is important. Contact a health care provider if:  You have new symptoms.  You have unexplained weight loss.  You have difficulty swallowing, or it hurts to swallow.  You have wheezing or a persistent cough.  Your symptoms do not improve with treatment.  You have frequent heartburn for more than two weeks. Get help right away if:  You have severe pain in your arms, neck, jaw, teeth, or back.  You feel sweaty, dizzy, or light-headed.  You have chest pain or shortness of breath.  You vomit and your vomit looks like blood or coffee grounds.  Your stool is bloody or black.  You have a fever.  You cannot swallow, drink, or eat. This information is not intended to replace advice given to you by your health care provider. Make sure you discuss any questions you have with your health care provider. Document Released: 10/17/2004  Document Revised: 02/15/2016 Document Reviewed: 01/04/2015 [employer] Interactive Patient Education  2018 [employer]     

## 2018-07-20 NOTE — Progress Notes (Signed)
Subjective:    Patient ID: Roy [name], male    DOB: 09-Dec-1970, 47 y.o.   MRN: [id number]  Chief Complaint:  Episodes of hoarseness (has noticed for the last 3-4 weeks he is having episodes of hoarseness, changes in his voice.  mainly after he has not spoken for a while.  )   HPI: Roy [name] is a 47 y.o. male presenting on 07/20/2018 for Episodes of hoarseness (has noticed for the last 3-4 weeks he is having episodes of hoarseness, changes in his voice.  mainly after he has not spoken for a while.  )   1. GERD with esophagitis   Pt presents today with complaints of an intermittent raspy voice, increased heart burn, and a cough. Pt states this has been ongoing for the last 2 weeks and seems worse after eating a spicy or greasy meal and with being supine. Pt states he does not watch his diet or remain upright after eating. Pt denies hemoptysis, fever, chills, or unintentional weight loss.    Relevant past medical, surgical, family, and social history reviewed and updated as indicated.  Allergies and medications reviewed and updated.   Past Medical History:  Diagnosis Date  . Anxiety   . Depression   . Hypertension   . Low testosterone   . Median nerve dysfunction   . Radiculopathy of cervical region   . Thyroid disease   . Ulnar neuropathy at elbow   . Weakness     Past Surgical History:  Procedure Laterality Date  . BICEPS TENDON REPAIR    . NECK SURGERY     Fusion done two seperate times  . ROTATOR CUFF REPAIR Right     Social History   Socioeconomic History  . Marital status: Married    Spouse name: Not on file  . Number of children: 1  . Years of education: GED  . Highest education level: GED or equivalent  Occupational History  . Occupation: Disability    Comment: Worked as Arboriculturist  . Financial resource strain: Not hard at all  . Food insecurity:    Worry: Never true    Inability: Never true  . Transportation needs:     Medical: No    Non-medical: No  Tobacco Use  . Smoking status: Former Smoker    Packs/day: 0.50    Years: 30.00    Pack years: 15.00    Types: Cigarettes    Last attempt to quit: 07/23/2017    Years since quitting: 0.9  . Smokeless tobacco: Former Neurosurgeon    Types: Snuff, Chew  Substance and Sexual Activity  . Alcohol use: No  . Drug use: No  . Sexual activity: Yes  Lifestyle  . Physical activity:    Days per week: Not on file    Minutes per session: Not on file  . Stress: Not on file  Relationships  . Social connections:    Talks on phone: Not on file    Gets together: Not on file    Attends religious service: Not on file    Active member of club or organization: Not on file    Attends meetings of clubs or organizations: Not on file    Relationship status: Not on file  . Intimate partner violence:    Fear of current or ex partner: Not on file    Emotionally abused: Not on file    Physically abused: Not on file    Forced  sexual activity: Not on file  Other Topics Concern  . Not on file  Social History Narrative  . Not on file    Outpatient Encounter Medications as of 07/20/2018  Medication Sig  . acetaminophen (TYLENOL) 500 MG tablet Take 1,000 mg by mouth every 6 (six) hours as needed for headache (pain).   Marland Kitchen albuterol (PROVENTIL HFA;VENTOLIN HFA) 108 (90 Base) MCG/ACT inhaler Inhale 1-2 puffs into the lungs every 6 (six) hours as needed for wheezing or shortness of breath.  . ALPRAZolam (XANAX) 0.5 MG tablet Take 1 tablet (0.5 mg total) by mouth 3 (three) times daily as needed for anxiety.  . budesonide-formoterol (SYMBICORT) 160-4.5 MCG/ACT inhaler INHALE TWO PUFFS BY MOUTH TWICE DAILY FOR COPD/ASTHMA  . carvedilol (COREG) 6.25 MG tablet Take 1 tablet (6.25 mg total) by mouth 2 (two) times daily with a meal.  . celecoxib (CELEBREX) 200 MG capsule Take 1 capsule (200 mg total) by mouth daily. For arthritis  . dexlansoprazole (DEXILANT) 60 MG capsule Take 1 capsule  (60 mg total) by mouth daily.  . DULoxetine (CYMBALTA) 60 MG capsule Take 2 capsules (120 mg total) by mouth daily.  Marland Kitchen gabapentin (NEURONTIN) 300 MG capsule Take 1 capsule (300 mg total) by mouth 3 (three) times daily.  Marland Kitchen gemfibrozil (LOPID) 600 MG tablet Take 1 tablet (600 mg total) by mouth 2 (two) times daily.  Marland Kitchen levothyroxine (SYNTHROID, LEVOTHROID) 50 MCG tablet Take 1 tablet (50 mcg total) by mouth daily.  Marland Kitchen linaclotide (LINZESS) 290 MCG CAPS capsule Take 1 capsule (290 mcg total) by mouth daily before breakfast.  . Omega-3 Fatty Acids (FISH OIL) 1000 MG CAPS Take 1,000 mg by mouth daily.   . polyethylene glycol powder (GLYCOLAX/MIRALAX) powder Take 1 capful daily.  . sildenafil (REVATIO) 20 MG tablet Take 2-5 tablets daily as needed for sex  . Testosterone (ANDROGEL PUMP) 20.25 MG/ACT (1.62%) GEL Apply 4 pumps daily to upper chest and shoulders  . traZODone (DESYREL) 100 MG tablet Take 1 tablet (100 mg total) by mouth at bedtime.  . varenicline (CHANTIX CONTINUING MONTH PAK) 1 MG tablet Take 1 tablet (1 mg total) by mouth daily.  . [DISCONTINUED] famotidine (PEPCID) 10 MG tablet Take 10 mg by mouth as needed for heartburn or indigestion.  . famotidine (PEPCID) 20 MG tablet Take 1 tablet (20 mg total) by mouth 2 (two) times daily for 14 days.   No facility-administered encounter medications on file as of 07/20/2018.     Allergies  Allergen Reactions  . Penicillins Rash and Other (See Comments)    Has patient had a PCN reaction causing immediate rash, facial/tongue/throat swelling, SOB or lightheadedness with hypotension: Yes Has patient had a PCN reaction causing severe rash involving mucus membranes or skin necrosis: No Has patient had a PCN reaction that required hospitalization: No Has patient had a PCN reaction occurring within the last 10 years: No If all of the above answers are "NO", then may proceed with Cephalosporin use. Has patient had a PCN reaction causing immediate rash,  facial/tongue/throat swelling, SOB or lightheadedness with hypotension: Yes Has patient had a PCN reaction causing severe rash involving mucus membranes or skin necrosis: No Has patient had a PCN reaction that required hospitalization: No Has patient had a PCN reaction occurring within the last 10 years: No If all of the above answers are "NO", then may proceed with Cephalosporin use. unknown   . Chantix [Varenicline] Other (See Comments)    Severe headache    Review of  Systems  Constitutional: Negative for appetite change, chills, fatigue, fever and unexpected weight change.  HENT: Positive for voice change. Negative for postnasal drip, rhinorrhea, sinus pressure, sinus pain, sore throat and trouble swallowing.   Respiratory: Positive for cough. Negative for chest tightness and shortness of breath.   Cardiovascular: Negative for chest pain, palpitations and leg swelling.  Gastrointestinal: Negative for abdominal pain, blood in stool, constipation, diarrhea, nausea and vomiting.  All other systems reviewed and are negative.       Objective:    BP 129/78   Pulse (!) 59   Temp (!) 97 F (36.1 C) (Oral)   Ht 5' 8.5" (1.74 m)   Wt (!) 346 lb (156.9 kg)   BMI 51.84 kg/m    Wt Readings from Last 3 Encounters:  07/20/18 (!) 346 lb (156.9 kg)  07/01/18 (!) 349 lb 3.2 oz (158.4 kg)  02/19/18 (!) 335 lb (152 kg)    Physical Exam  Constitutional: He appears well-developed and well-nourished. He is cooperative. No distress.  HENT:  Head: Normocephalic and atraumatic.  Right Ear: Hearing, tympanic membrane, external ear and ear canal normal.  Left Ear: Hearing, tympanic membrane, external ear and ear canal normal.  Nose: Nose normal.  Mouth/Throat: Uvula is midline and mucous membranes are normal. Posterior oropharyngeal erythema present. No oropharyngeal exudate, posterior oropharyngeal edema or tonsillar abscesses. No tonsillar exudate.  Neck: Trachea normal and phonation normal.  Neck supple. No thyroid mass and no thyromegaly present.  Cardiovascular: Normal rate, regular rhythm and normal heart sounds. Exam reveals no gallop and no friction rub.  No murmur heard. Pulmonary/Chest: Effort normal and breath sounds normal. No stridor. No respiratory distress. He has no wheezes. He has no rhonchi. He has no rales. He exhibits no tenderness.  Abdominal: Soft. Normal appearance and bowel sounds are normal. There is no tenderness.  Neurological: He is alert.  Skin: Skin is warm and dry. Capillary refill takes less than 2 seconds.  Psychiatric: He has a normal mood and affect. His speech is normal and behavior is normal. Judgment and thought content normal. Cognition and memory are normal.  Nursing note and vitals reviewed.     Pertinent labs & imaging results that were available during my care of the patient were reviewed by me and considered in my medical decision making.  Assessment & Plan:  Li was seen today for episodes of hoarseness.  Diagnoses and all orders for this visit:  GERD with esophagitis Continue Dexilant and add famotidine for the next 2 weeks.  Return if symptoms persist.  Avoid spicy and greasy foods.  Avoid tobacco, alcohol, and caffeine.  Do not go to bed or recline for at least 45 minutes after eating.  -     famotidine (PEPCID) 20 MG tablet; Take 1 tablet (20 mg total) by mouth 2 (two) times daily for 14 days.    Continue all other maintenance medications.  Follow up plan: Return in about 2 weeks (around 08/03/2018).  Educational handout given for esophagitis   The above assessment and management plan was discussed with the patient. The patient verbalized understanding of and has agreed to the management plan. Patient is aware to call the clinic if symptoms persist or worsen. Patient is aware when to return to the clinic for a follow-up visit. Patient educated on when it is appropriate to go to the emergency department.   Kari Baars, FNP-C [facility] [phone]

## 2018-07-29 DIAGNOSIS — S46011A Strain of muscle(s) and tendon(s) of the rotator cuff of right shoulder, initial encounter: Secondary | ICD-10-CM | POA: Diagnosis not present

## 2018-07-29 DIAGNOSIS — M62511 Muscle wasting and atrophy, not elsewhere classified, right shoulder: Secondary | ICD-10-CM | POA: Diagnosis not present

## 2018-07-29 DIAGNOSIS — S43421A Sprain of right rotator cuff capsule, initial encounter: Secondary | ICD-10-CM | POA: Diagnosis not present

## 2018-07-29 DIAGNOSIS — Z9889 Other specified postprocedural states: Secondary | ICD-10-CM | POA: Diagnosis not present

## 2018-08-05 DIAGNOSIS — M75121 Complete rotator cuff tear or rupture of right shoulder, not specified as traumatic: Secondary | ICD-10-CM | POA: Diagnosis not present

## 2018-08-10 DIAGNOSIS — M75121 Complete rotator cuff tear or rupture of right shoulder, not specified as traumatic: Secondary | ICD-10-CM | POA: Diagnosis not present

## 2018-08-13 DIAGNOSIS — S43431A Superior glenoid labrum lesion of right shoulder, initial encounter: Secondary | ICD-10-CM | POA: Diagnosis not present

## 2018-08-13 DIAGNOSIS — M13811 Other specified arthritis, right shoulder: Secondary | ICD-10-CM | POA: Diagnosis not present

## 2018-08-13 DIAGNOSIS — M24111 Other articular cartilage disorders, right shoulder: Secondary | ICD-10-CM | POA: Diagnosis not present

## 2018-08-13 DIAGNOSIS — M19011 Primary osteoarthritis, right shoulder: Secondary | ICD-10-CM | POA: Diagnosis not present

## 2018-08-13 DIAGNOSIS — M75101 Unspecified rotator cuff tear or rupture of right shoulder, not specified as traumatic: Secondary | ICD-10-CM | POA: Diagnosis not present

## 2018-08-13 DIAGNOSIS — E785 Hyperlipidemia, unspecified: Secondary | ICD-10-CM | POA: Diagnosis not present

## 2018-08-13 DIAGNOSIS — Z6841 Body Mass Index (BMI) 40.0 and over, adult: Secondary | ICD-10-CM | POA: Diagnosis not present

## 2018-08-13 DIAGNOSIS — I10 Essential (primary) hypertension: Secondary | ICD-10-CM | POA: Diagnosis not present

## 2018-08-13 DIAGNOSIS — G4730 Sleep apnea, unspecified: Secondary | ICD-10-CM | POA: Diagnosis not present

## 2018-08-13 DIAGNOSIS — J449 Chronic obstructive pulmonary disease, unspecified: Secondary | ICD-10-CM | POA: Diagnosis not present

## 2018-08-13 DIAGNOSIS — G8918 Other acute postprocedural pain: Secondary | ICD-10-CM | POA: Diagnosis not present

## 2018-08-24 ENCOUNTER — Ambulatory Visit: Payer: Medicare HMO | Admitting: Family Medicine

## 2018-08-24 DIAGNOSIS — M75121 Complete rotator cuff tear or rupture of right shoulder, not specified as traumatic: Secondary | ICD-10-CM | POA: Diagnosis not present

## 2018-08-24 DIAGNOSIS — R52 Pain, unspecified: Secondary | ICD-10-CM | POA: Diagnosis not present

## 2018-08-24 DIAGNOSIS — Z9889 Other specified postprocedural states: Secondary | ICD-10-CM | POA: Diagnosis not present

## 2018-09-01 DIAGNOSIS — Z9889 Other specified postprocedural states: Secondary | ICD-10-CM | POA: Diagnosis not present

## 2018-09-01 DIAGNOSIS — M25511 Pain in right shoulder: Secondary | ICD-10-CM | POA: Diagnosis not present

## 2018-09-01 DIAGNOSIS — R29898 Other symptoms and signs involving the musculoskeletal system: Secondary | ICD-10-CM | POA: Diagnosis not present

## 2018-09-01 DIAGNOSIS — M75121 Complete rotator cuff tear or rupture of right shoulder, not specified as traumatic: Secondary | ICD-10-CM | POA: Diagnosis not present

## 2018-09-07 DIAGNOSIS — Z9889 Other specified postprocedural states: Secondary | ICD-10-CM | POA: Diagnosis not present

## 2018-09-07 DIAGNOSIS — R29898 Other symptoms and signs involving the musculoskeletal system: Secondary | ICD-10-CM | POA: Diagnosis not present

## 2018-09-07 DIAGNOSIS — M75121 Complete rotator cuff tear or rupture of right shoulder, not specified as traumatic: Secondary | ICD-10-CM | POA: Diagnosis not present

## 2018-09-07 DIAGNOSIS — M25511 Pain in right shoulder: Secondary | ICD-10-CM | POA: Diagnosis not present

## 2018-09-18 DIAGNOSIS — R29898 Other symptoms and signs involving the musculoskeletal system: Secondary | ICD-10-CM | POA: Diagnosis not present

## 2018-09-18 DIAGNOSIS — M75121 Complete rotator cuff tear or rupture of right shoulder, not specified as traumatic: Secondary | ICD-10-CM | POA: Diagnosis not present

## 2018-09-18 DIAGNOSIS — Z9889 Other specified postprocedural states: Secondary | ICD-10-CM | POA: Diagnosis not present

## 2018-09-18 DIAGNOSIS — M25511 Pain in right shoulder: Secondary | ICD-10-CM | POA: Diagnosis not present

## 2018-09-24 DIAGNOSIS — R29898 Other symptoms and signs involving the musculoskeletal system: Secondary | ICD-10-CM | POA: Diagnosis not present

## 2018-09-24 DIAGNOSIS — M25511 Pain in right shoulder: Secondary | ICD-10-CM | POA: Diagnosis not present

## 2018-09-24 DIAGNOSIS — M75121 Complete rotator cuff tear or rupture of right shoulder, not specified as traumatic: Secondary | ICD-10-CM | POA: Diagnosis not present

## 2018-09-24 DIAGNOSIS — Z9889 Other specified postprocedural states: Secondary | ICD-10-CM | POA: Diagnosis not present

## 2018-09-29 DIAGNOSIS — M25511 Pain in right shoulder: Secondary | ICD-10-CM | POA: Diagnosis not present

## 2018-09-29 DIAGNOSIS — M75121 Complete rotator cuff tear or rupture of right shoulder, not specified as traumatic: Secondary | ICD-10-CM | POA: Diagnosis not present

## 2018-09-29 DIAGNOSIS — Z9889 Other specified postprocedural states: Secondary | ICD-10-CM | POA: Diagnosis not present

## 2018-09-29 DIAGNOSIS — R29898 Other symptoms and signs involving the musculoskeletal system: Secondary | ICD-10-CM | POA: Diagnosis not present

## 2018-10-13 DIAGNOSIS — Z9889 Other specified postprocedural states: Secondary | ICD-10-CM | POA: Diagnosis not present

## 2018-10-13 DIAGNOSIS — R29898 Other symptoms and signs involving the musculoskeletal system: Secondary | ICD-10-CM | POA: Diagnosis not present

## 2018-10-13 DIAGNOSIS — M75121 Complete rotator cuff tear or rupture of right shoulder, not specified as traumatic: Secondary | ICD-10-CM | POA: Diagnosis not present

## 2018-10-13 DIAGNOSIS — M25511 Pain in right shoulder: Secondary | ICD-10-CM | POA: Diagnosis not present

## 2018-10-20 DIAGNOSIS — M75121 Complete rotator cuff tear or rupture of right shoulder, not specified as traumatic: Secondary | ICD-10-CM | POA: Diagnosis not present

## 2018-10-20 DIAGNOSIS — M25511 Pain in right shoulder: Secondary | ICD-10-CM | POA: Diagnosis not present

## 2018-10-20 DIAGNOSIS — Z9889 Other specified postprocedural states: Secondary | ICD-10-CM | POA: Diagnosis not present

## 2018-10-20 DIAGNOSIS — R29898 Other symptoms and signs involving the musculoskeletal system: Secondary | ICD-10-CM | POA: Diagnosis not present

## 2018-11-03 ENCOUNTER — Telehealth: Payer: Self-pay | Admitting: Family Medicine

## 2018-11-03 ENCOUNTER — Other Ambulatory Visit: Payer: Self-pay | Admitting: Family Medicine

## 2018-11-03 DIAGNOSIS — M75121 Complete rotator cuff tear or rupture of right shoulder, not specified as traumatic: Secondary | ICD-10-CM | POA: Diagnosis not present

## 2018-11-03 MED ORDER — SILDENAFIL CITRATE 20 MG PO TABS: ORAL_TABLET | ORAL | 1 refills | Status: DC

## 2018-11-03 NOTE — Telephone Encounter (Signed)
[email or web address]

## 2018-11-03 NOTE — Telephone Encounter (Signed)
I sent in the requested prescription 

## 2018-11-03 NOTE — Telephone Encounter (Signed)
[facility] is a discount program, not a pharmacy. Where does he want the med sent? [facility]

## 2018-11-04 NOTE — Telephone Encounter (Signed)
Left message stating that Rx has been sent to pharmacy

## 2018-11-10 DIAGNOSIS — M75121 Complete rotator cuff tear or rupture of right shoulder, not specified as traumatic: Secondary | ICD-10-CM | POA: Diagnosis not present

## 2018-11-17 DIAGNOSIS — M75121 Complete rotator cuff tear or rupture of right shoulder, not specified as traumatic: Secondary | ICD-10-CM | POA: Diagnosis not present

## 2018-12-27 ENCOUNTER — Other Ambulatory Visit: Payer: Self-pay | Admitting: Family Medicine

## 2018-12-28 NOTE — Telephone Encounter (Signed)
Last seen 09/19/18  Dr Darlyn Read PCP

## 2018-12-29 ENCOUNTER — Other Ambulatory Visit: Payer: Self-pay | Admitting: *Deleted

## 2018-12-29 MED ORDER — ALPRAZOLAM 0.5 MG PO TABS: 0.5000 mg | ORAL_TABLET | Freq: Three times a day (TID) | ORAL | 0 refills | Status: DC | PRN

## 2019-01-01 ENCOUNTER — Ambulatory Visit: Payer: Medicare HMO | Admitting: Family Medicine

## 2019-01-06 ENCOUNTER — Encounter: Payer: Self-pay | Admitting: Family Medicine

## 2019-01-06 ENCOUNTER — Other Ambulatory Visit: Payer: Self-pay | Admitting: Family Medicine

## 2019-01-06 ENCOUNTER — Ambulatory Visit (INDEPENDENT_AMBULATORY_CARE_PROVIDER_SITE_OTHER): Payer: Medicare HMO | Admitting: Family Medicine

## 2019-01-06 ENCOUNTER — Other Ambulatory Visit: Payer: Self-pay

## 2019-01-06 DIAGNOSIS — K219 Gastro-esophageal reflux disease without esophagitis: Secondary | ICD-10-CM

## 2019-01-06 DIAGNOSIS — F411 Generalized anxiety disorder: Secondary | ICD-10-CM

## 2019-01-06 DIAGNOSIS — E039 Hypothyroidism, unspecified: Secondary | ICD-10-CM

## 2019-01-06 DIAGNOSIS — E349 Endocrine disorder, unspecified: Secondary | ICD-10-CM | POA: Diagnosis not present

## 2019-01-06 DIAGNOSIS — I10 Essential (primary) hypertension: Secondary | ICD-10-CM

## 2019-01-06 MED ORDER — GEMFIBROZIL 600 MG PO TABS: 600.0000 mg | ORAL_TABLET | Freq: Two times a day (BID) | ORAL | 5 refills | Status: DC

## 2019-01-06 MED ORDER — LEVOTHYROXINE SODIUM 50 MCG PO TABS: 50.0000 ug | ORAL_TABLET | Freq: Every day | ORAL | 5 refills | Status: DC

## 2019-01-06 MED ORDER — TESTOSTERONE 20.25 MG/ACT (1.62%) TD GEL: TRANSDERMAL | 5 refills | Status: DC

## 2019-01-06 MED ORDER — CELECOXIB 200 MG PO CAPS: 200.0000 mg | ORAL_CAPSULE | Freq: Every day | ORAL | 3 refills | Status: DC

## 2019-01-06 MED ORDER — DEXLANSOPRAZOLE 60 MG PO CPDR: 1.0000 | DELAYED_RELEASE_CAPSULE | Freq: Every day | ORAL | 1 refills | Status: DC

## 2019-01-06 MED ORDER — TRAZODONE HCL 100 MG PO TABS: 100.0000 mg | ORAL_TABLET | Freq: Every day | ORAL | 3 refills | Status: DC

## 2019-01-06 MED ORDER — CARVEDILOL 6.25 MG PO TABS: 6.2500 mg | ORAL_TABLET | Freq: Two times a day (BID) | ORAL | 1 refills | Status: DC

## 2019-01-06 MED ORDER — ALPRAZOLAM 0.5 MG PO TABS: 0.5000 mg | ORAL_TABLET | Freq: Three times a day (TID) | ORAL | 5 refills | Status: DC | PRN

## 2019-01-06 MED ORDER — LINACLOTIDE 290 MCG PO CAPS: 290.0000 ug | ORAL_CAPSULE | Freq: Every day | ORAL | 1 refills | Status: DC

## 2019-01-06 MED ORDER — BUDESONIDE-FORMOTEROL FUMARATE 160-4.5 MCG/ACT IN AERO: INHALATION_SPRAY | RESPIRATORY_TRACT | 11 refills | Status: DC

## 2019-01-06 NOTE — Progress Notes (Signed)
Subjective:  Patient ID: Roy [name], male    DOB: April 10, 1971  Age: 48 y.o. MRN: [id number]  CC: No chief complaint on file.   HPI Roy [name] presents for patient presents for follow-up on  thyroid. The patient has a history of hypothyroidism for many years. It has been stable recently. Pt. denies any change in  voice, loss of hair, heat or cold intolerance. Energy level has been adequate to good. Patient denies constipation and diarrhea. No myxedema. Medication is as noted below. Verified that pt is taking it daily on an empty stomach. Well tolerated.  Patient in for follow-up of GERD. Currently asymptomatic taking  PPI daily. There is no chest pain or heartburn. No hematemesis and no melena. No dysphagia or choking. Onset is remote. Progression is stable. Complicating factors, none.  Patient says that he is dealing with his anxiety reasonably well he is not feeling excessively nervous as long as he takes his medication.  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  He is able to relax.  He is not restless.  Eyes excessive irritability.  He also denies being afraid of something awful possibly happening.  Follow up for testosterone deficiency: Pt. Using medication as directed. Denies any sx referrable to DVT such as edema or erythema of legs. No dyspnea or chest pain. Energy level reported as being good. Libido is normal and denies E.D. Feels strength is adequate and improved from baseline.    Follow-up of hypertension. Patient has no history of headache chest pain or shortness of breath or recent cough. Patient also denies symptoms of TIA such as numbness weakness lateralizing. Patient checks  blood pressure at home and has not had any elevated readings recently. Patient denies side effects from his medication. States taking it regularly.   Depression screen [facility] 2/9 07/20/2018 07/01/2018 02/19/2018  Decreased Interest 0 0 1  Down, Depressed, Hopeless 0 0 1  PHQ - 2 Score 0 0 2   Altered sleeping - - 0  Tired, decreased energy - - 2  Change in appetite - - -  Feeling bad or failure about yourself  - - 1  Trouble concentrating - - 0  Moving slowly or fidgety/restless - - 0  Suicidal thoughts - - 0  PHQ-9 Score - - 5  Difficult doing work/chores - - Somewhat difficult  Some recent data might be hidden    History Roy [name] has a past medical history of Anxiety, Depression, Hypertension, Low testosterone, Median nerve dysfunction, Radiculopathy of cervical region, Thyroid disease, Ulnar neuropathy at elbow, and Weakness.   He has a past surgical history that includes Rotator cuff repair (Right); Biceps tendon repair; and Neck surgery.   His family history includes Arrhythmia in his father; Asthma in his brother and daughter; Bipolar disorder in his brother; COPD in his father; Fibromyalgia in his sister; Hypertension in his mother; Stroke (age of onset: 27) in his mother.He reports that he quit smoking about 17 months ago. His smoking use included cigarettes. He has a 15.00 pack-year smoking history. He has quit using smokeless tobacco.  His smokeless tobacco use included snuff and chew. He reports that he does not drink alcohol or use drugs.    ROS Review of Systems  Constitutional: Negative for fever.  HENT: Positive for congestion, rhinorrhea and sneezing. Negative for sore throat.   Respiratory: Negative for cough and shortness of breath.   Cardiovascular: Negative for chest pain.  Musculoskeletal: Negative for arthralgias.  Skin: Negative for  rash.    Objective:  There were no vitals taken for this visit.  BP Readings from Last 3 Encounters:  07/20/18 129/78  07/01/18 123/78  02/19/18 125/78    Wt Readings from Last 3 Encounters:  07/20/18 (!) 346 lb (156.9 kg)  07/01/18 (!) 349 lb 3.2 oz (158.4 kg)  02/19/18 (!) 335 lb (152 kg)     Physical Exam    Assessment & Plan:   Diagnoses and all orders for this visit:  Morbid obesity (HCC)   Hypotestosteronism -     Discontinue: Testosterone (ANDROGEL PUMP) 20.25 MG/ACT (1.62%) GEL; Apply 4 pumps daily to upper chest and shoulders -     Testosterone (ANDROGEL PUMP) 20.25 MG/ACT (1.62%) GEL; Apply 4 pumps daily to upper chest and shoulders  Gastroesophageal reflux disease, esophagitis presence not specified  GAD (generalized anxiety disorder)  Hypothyroidism, unspecified type -     levothyroxine (SYNTHROID, LEVOTHROID) 50 MCG tablet; Take 1 tablet (50 mcg total) by mouth daily.  Other orders -     ALPRAZolam (XANAX) 0.5 MG tablet; Take 1 tablet (0.5 mg total) by mouth 3 (three) times daily as needed for anxiety. -     dexlansoprazole (DEXILANT) 60 MG capsule; Take 1 capsule (60 mg total) by mouth daily. -     traZODone (DESYREL) 100 MG tablet; Take 1 tablet (100 mg total) by mouth at bedtime. -     celecoxib (CELEBREX) 200 MG capsule; Take 1 capsule (200 mg total) by mouth daily. For arthritis -     budesonide-formoterol (SYMBICORT) 160-4.5 MCG/ACT inhaler; INHALE TWO PUFFS BY MOUTH TWICE DAILY FOR COPD/ASTHMA -     carvedilol (COREG) 6.25 MG tablet; Take 1 tablet (6.25 mg total) by mouth 2 (two) times daily with a meal. -     gemfibrozil (LOPID) 600 MG tablet; Take 1 tablet (600 mg total) by mouth 2 (two) times daily. -     linaclotide (LINZESS) 290 MCG CAPS capsule; Take 1 capsule (290 mcg total) by mouth daily before breakfast.       I have changed Roy [name]'s ALPRAZolam. I am also having him maintain his Fish Oil, acetaminophen, polyethylene glycol powder, varenicline, albuterol, DULoxetine, gabapentin, famotidine, sildenafil, dexlansoprazole, traZODone, celecoxib, budesonide-formoterol, carvedilol, gemfibrozil, levothyroxine, linaclotide, and Testosterone.  Allergies as of 01/06/2019      Reactions   Penicillins Rash, Other (See Comments)   Has patient had a PCN reaction causing immediate rash, facial/tongue/throat swelling, SOB or lightheadedness with  hypotension: Yes Has patient had a PCN reaction causing severe rash involving mucus membranes or skin necrosis: No Has patient had a PCN reaction that required hospitalization: No Has patient had a PCN reaction occurring within the last 10 years: No If all of the above answers are "NO", then may proceed with Cephalosporin use. Has patient had a PCN reaction causing immediate rash, facial/tongue/throat swelling, SOB or lightheadedness with hypotension: Yes Has patient had a PCN reaction causing severe rash involving mucus membranes or skin necrosis: No Has patient had a PCN reaction that required hospitalization: No Has patient had a PCN reaction occurring within the last 10 years: No If all of the above answers are "NO", then may proceed with Cephalosporin use. unknown   Chantix [varenicline] Other (See Comments)   Severe headache      Medication List       Accurate as of January 06, 2019 10:01 AM. Always use your most recent med list.        acetaminophen 500 MG  tablet Commonly known as:  TYLENOL Take 1,000 mg by mouth every 6 (six) hours as needed for headache (pain).   albuterol 108 (90 Base) MCG/ACT inhaler Commonly known as:  PROVENTIL HFA;VENTOLIN HFA Inhale 1-2 puffs into the lungs every 6 (six) hours as needed for wheezing or shortness of breath.   ALPRAZolam 0.5 MG tablet Commonly known as:  XANAX Take 1 tablet (0.5 mg total) by mouth 3 (three) times daily as needed for anxiety.   budesonide-formoterol 160-4.5 MCG/ACT inhaler Commonly known as:  Symbicort INHALE TWO PUFFS BY MOUTH TWICE DAILY FOR COPD/ASTHMA   carvedilol 6.25 MG tablet Commonly known as:  COREG Take 1 tablet (6.25 mg total) by mouth 2 (two) times daily with a meal.   celecoxib 200 MG capsule Commonly known as:  CeleBREX Take 1 capsule (200 mg total) by mouth daily. For arthritis   dexlansoprazole 60 MG capsule Commonly known as:  Dexilant Take 1 capsule (60 mg total) by mouth daily.    DULoxetine 60 MG capsule Commonly known as:  CYMBALTA Take 2 capsules (120 mg total) by mouth daily.   famotidine 20 MG tablet Commonly known as:  Pepcid Take 1 tablet (20 mg total) by mouth 2 (two) times daily for 14 days.   Fish Oil 1000 MG Caps Take 1,000 mg by mouth daily.   gabapentin 300 MG capsule Commonly known as:  NEURONTIN Take 1 capsule (300 mg total) by mouth 3 (three) times daily.   gemfibrozil 600 MG tablet Commonly known as:  LOPID Take 1 tablet (600 mg total) by mouth 2 (two) times daily.   levothyroxine 50 MCG tablet Commonly known as:  SYNTHROID, LEVOTHROID Take 1 tablet (50 mcg total) by mouth daily.   linaclotide 290 MCG Caps capsule Commonly known as:  LINZESS Take 1 capsule (290 mcg total) by mouth daily before breakfast.   polyethylene glycol powder 17 GM/SCOOP powder Commonly known as:  GLYCOLAX/MIRALAX Take 1 capful daily.   sildenafil 20 MG tablet Commonly known as:  REVATIO Take 2-5 tablets daily as needed for sex   Testosterone 20.25 MG/ACT (1.62%) Gel Commonly known as:  AndroGel Pump Apply 4 pumps daily to upper chest and shoulders   traZODone 100 MG tablet Commonly known as:  DESYREL Take 1 tablet (100 mg total) by mouth at bedtime.   varenicline 1 MG tablet Commonly known as:  Chantix Continuing Month Pak Take 1 tablet (1 mg total) by mouth daily.      Virtual Visit via telephone Note  I discussed the limitations, risks, security and privacy concerns of performing an evaluation and management service by telephone and the availability of in person appointments. I also discussed with the patient that there may be a patient responsible charge related to this service. The patient expressed understanding and agreed to proceed. Pt. Is at home. Dr. Darlyn ReadStacks is in his office.  Follow Up Instructions:   I discussed the assessment and treatment plan with the patient. The patient was provided an opportunity to ask questions and all were  answered. The patient agreed with the plan and demonstrated an understanding of the instructions.   The patient was advised to call back or seek an in-person evaluation if the symptoms worsen or if the condition fails to improve as anticipated.  Visit started: 9:42 Call ended:  10:00 Total minutes including chart review and phone contact time: 32   Follow-up: Return in about 5 months (around 06/08/2019) for Anxiety.  Mechele ClaudeWarren Sivan Quast, M.D.

## 2019-01-08 ENCOUNTER — Telehealth: Payer: Self-pay

## 2019-01-08 NOTE — Telephone Encounter (Signed)
Please appeal. We may have to submit testosterone levels, if so, go ahead and order. Thanks, WS

## 2019-01-08 NOTE — Telephone Encounter (Signed)
Patient's insurance denied Testosterone.  We may file an appeal if you'd like.

## 2019-01-08 NOTE — Telephone Encounter (Signed)
Provider requesting letter be done.

## 2019-01-13 DIAGNOSIS — Z4789 Encounter for other orthopedic aftercare: Secondary | ICD-10-CM | POA: Diagnosis not present

## 2019-01-13 NOTE — Telephone Encounter (Signed)
FYI, appeal was filed this morning with patient's insurance company.

## 2019-01-19 NOTE — Telephone Encounter (Signed)
FYI, patient's Testosterone gel was finally approved.

## 2019-01-22 ENCOUNTER — Other Ambulatory Visit: Payer: Self-pay

## 2019-01-22 ENCOUNTER — Encounter: Payer: Self-pay | Admitting: Family

## 2019-01-22 ENCOUNTER — Ambulatory Visit (INDEPENDENT_AMBULATORY_CARE_PROVIDER_SITE_OTHER): Payer: Medicare HMO | Admitting: Family

## 2019-01-22 DIAGNOSIS — J4521 Mild intermittent asthma with (acute) exacerbation: Secondary | ICD-10-CM | POA: Diagnosis not present

## 2019-01-22 DIAGNOSIS — J45909 Unspecified asthma, uncomplicated: Secondary | ICD-10-CM | POA: Insufficient documentation

## 2019-01-22 DIAGNOSIS — R079 Chest pain, unspecified: Secondary | ICD-10-CM

## 2019-01-22 NOTE — Progress Notes (Signed)
   Virtual Visit via telephone Note  I connected with Roy [name] on 01/22/19 at 3:32 pm by telephone and verified that I am speaking with the correct person using two identifiers. Roy [name] is currently located at home  and no one is currently with her during visit. The provider, Jannifer Rodney, FNP is located in their office at time of visit.  I discussed the limitations, risks, security and privacy concerns of performing an evaluation and management service by telephone and the availability of in person appointments. I also discussed with the patient that there may be a patient responsible charge related to this service. The patient expressed understanding and agreed to proceed.   History and Present Illness:  PT calls the office today with intermittent chest pain that comes and goes over the last 3 days ago on his left side. He states he has asthma and intermittent productive cough.  Chest Pain   This is a new problem. The current episode started in the past 7 days. The onset quality is gradual. The problem occurs intermittently. The problem has been waxing and waning. The pain is present in the substernal region. The pain is at a severity of 5/10. The pain is mild. The quality of the pain is described as pressure and tightness. The pain does not radiate. Associated symptoms include a cough, shortness of breath and sputum production. Pertinent negatives include no back pain, claudication, dizziness, exertional chest pressure, fever, lower extremity edema, malaise/fatigue or orthopnea. The cough is productive.      Review of Systems  Constitutional: Negative for fever and malaise/fatigue.  Respiratory: Positive for cough, sputum production and shortness of breath.   Cardiovascular: Positive for chest pain. Negative for orthopnea and claudication.  Musculoskeletal: Negative for back pain.  Neurological: Negative for dizziness.  All other systems reviewed and are negative.     Observations/Objective: No SOB or distress  Assessment and Plan: 1. Mild intermittent asthma with acute exacerbation  2. Chest pain, unspecified type  3. Morbid obesity (HCC)  Given intermittent chest pain, I have advised patient to go to ED. He does have asthma and a productive cough. He states this chest pain is different from his previous breathing problems. I recommend him trying the albuterol inhaler to see if it helps with the tightness. He states he understands and will go to ED.     I discussed the assessment and treatment plan with the patient. The patient was provided an opportunity to ask questions and all were answered. The patient agreed with the plan and demonstrated an understanding of the instructions.   The patient was advised to call back or seek an in-person evaluation if the symptoms worsen or if the condition fails to improve as anticipated.  The above assessment and management plan was discussed with the patient. The patient verbalized understanding of and has agreed to the management plan. Patient is aware to call the clinic if symptoms persist or worsen. Patient is aware when to return to the clinic for a follow-up visit. Patient educated on when it is appropriate to go to the emergency department.   Time call ended:  3:47 pm  I provided minutes 15 of non-face-to-face time during this encounter.    Jannifer Rodney, FNP

## 2019-01-23 ENCOUNTER — Other Ambulatory Visit: Payer: Self-pay

## 2019-01-23 ENCOUNTER — Emergency Department (HOSPITAL_COMMUNITY): Payer: Medicare HMO

## 2019-01-23 ENCOUNTER — Encounter (HOSPITAL_COMMUNITY): Payer: Self-pay

## 2019-01-23 ENCOUNTER — Emergency Department (HOSPITAL_COMMUNITY)
Admission: EM | Admit: 2019-01-23 | Discharge: 2019-01-23 | Disposition: A | Discharge status: Home / Self Care | Payer: Medicare HMO | Attending: Emergency Medicine | Admitting: Emergency Medicine

## 2019-01-23 DIAGNOSIS — R0789 Other chest pain: Secondary | ICD-10-CM | POA: Diagnosis not present

## 2019-01-23 DIAGNOSIS — R0602 Shortness of breath: Secondary | ICD-10-CM | POA: Diagnosis present

## 2019-01-23 DIAGNOSIS — J45901 Unspecified asthma with (acute) exacerbation: Secondary | ICD-10-CM

## 2019-01-23 DIAGNOSIS — E039 Hypothyroidism, unspecified: Secondary | ICD-10-CM | POA: Insufficient documentation

## 2019-01-23 DIAGNOSIS — Z79899 Other long term (current) drug therapy: Secondary | ICD-10-CM | POA: Insufficient documentation

## 2019-01-23 DIAGNOSIS — Z87891 Personal history of nicotine dependence: Secondary | ICD-10-CM | POA: Diagnosis not present

## 2019-01-23 DIAGNOSIS — R079 Chest pain, unspecified: Secondary | ICD-10-CM | POA: Diagnosis not present

## 2019-01-23 DIAGNOSIS — I10 Essential (primary) hypertension: Secondary | ICD-10-CM | POA: Diagnosis not present

## 2019-01-23 DIAGNOSIS — J40 Bronchitis, not specified as acute or chronic: Secondary | ICD-10-CM

## 2019-01-23 LAB — CBC
HCT: 41.5 % (ref 39.0–52.0)
Hemoglobin: 13.7 g/dL (ref 13.0–17.0)
MCH: 30.8 pg (ref 26.0–34.0)
MCHC: 33 g/dL (ref 30.0–36.0)
MCV: 93.3 fL (ref 80.0–100.0)
Platelets: 256 10*3/uL (ref 150–400)
RBC: 4.45 MIL/uL (ref 4.22–5.81)
RDW: 13.5 % (ref 11.5–15.5)
WBC: 5.6 10*3/uL (ref 4.0–10.5)
nRBC: 0 % (ref 0.0–0.2)

## 2019-01-23 LAB — BASIC METABOLIC PANEL
Anion gap: 12 (ref 5–15)
BUN: 7 mg/dL (ref 6–20)
CO2: 23 mmol/L (ref 22–32)
Calcium: 9.1 mg/dL (ref 8.9–10.3)
Chloride: 99 mmol/L (ref 98–111)
Creatinine, Ser: 0.74 mg/dL (ref 0.61–1.24)
GFR calc Af Amer: >60 mL/min (ref >60)
GFR calc non Af Amer: >60 mL/min (ref >60)
Glucose, Bld: 109 mg/dL — ABNORMAL HIGH (ref 70–99)
Potassium: 3.8 mmol/L (ref 3.5–5.1)
Sodium: 134 mmol/L — ABNORMAL LOW (ref 135–145)

## 2019-01-23 LAB — D-DIMER, QUANTITATIVE: D-Dimer, Quant: 0.43 ug/mL-FEU (ref 0.00–0.50)

## 2019-01-23 LAB — TROPONIN I
Troponin I: <0.03 ng/mL (ref <0.03)
Troponin I: <0.03 ng/mL (ref <0.03)

## 2019-01-23 LAB — BRAIN NATRIURETIC PEPTIDE: B Natriuretic Peptide: 51 pg/mL (ref 0.0–100.0)

## 2019-01-23 MED ORDER — AEROCHAMBER PLUS FLO-VU LARGE MISC
1.0000 | Freq: Once | Status: AC
  Administered 2019-01-23: 1

## 2019-01-23 MED ORDER — PREDNISONE 20 MG PO TABS: ORAL_TABLET | ORAL | 0 refills | Status: DC

## 2019-01-23 MED ORDER — ALBUTEROL SULFATE HFA 108 (90 BASE) MCG/ACT IN AERS: 2.0000 | INHALATION_SPRAY | Freq: Once | RESPIRATORY_TRACT | Status: DC

## 2019-01-23 MED ORDER — ALBUTEROL SULFATE HFA 108 (90 BASE) MCG/ACT IN AERS
6.0000 | INHALATION_SPRAY | Freq: Once | RESPIRATORY_TRACT | Status: AC
  Administered 2019-01-23: 17:00:00 6 via RESPIRATORY_TRACT
  Filled 2019-01-23: qty 6.7

## 2019-01-23 MED ORDER — ALBUTEROL SULFATE HFA 108 (90 BASE) MCG/ACT IN AERS: 1.0000 | INHALATION_SPRAY | RESPIRATORY_TRACT | 0 refills | Status: DC | PRN

## 2019-01-23 MED ORDER — PREDNISONE 20 MG PO TABS
60.0000 mg | ORAL_TABLET | Freq: Once | ORAL | Status: AC
  Administered 2019-01-23: 60 mg via ORAL
  Filled 2019-01-23: qty 3

## 2019-01-23 NOTE — ED Triage Notes (Signed)
Chest pain for 3-4 days mainly on the left side of the chest over towards the middle as well. Denies radiation any further than from Lt to Center. Denies n/v, rates pain 3/10, PCP told him to come to ED to be checked due to his cardiac symptoms.

## 2019-01-23 NOTE — ED Provider Notes (Signed)
Care assumed from Dr. Jeraldine LootsLockwood at sign over of patient, pt was seen by Dr. Erma HeritageIsaacs prior to shift change, please see their notes for full documentation of patient's complaint/HPI. Briefly, pt here with L sided chest pain intermittently x4-5 days with mild increased cough and SOB. Results so far show BMP essentially WNL, CBC WNL, trop neg x1, BNP WNL, D-dimer neg, CXR neg, EKG nonischemic. Awaiting second trop at 7pm. Plan is to reassess after second trop, if neg then likely d/c home with inhaler and prednisone burst.   Additional hx: pt reports hx of asthma.   Physical Exam  BP (!) 148/91   Pulse 61   Temp 98.3 F (36.8 C) (Oral)   Resp 14   Ht 5\' 8"  (1.727 m)   Wt (!) 149.7 kg   SpO2 100%   BMI 50.18 kg/m   Physical Exam Gen: afebrile, VSS, NAD HEENT: EOMI Resp: no resp distress, very faint expiratory wheeze in lower fields bilaterally, no rhonchi/rales, no hypoxia or increased WOB, speaking in full sentences, SpO2 100% on RA  CV: RRR, nl s1/s2, no m/r/g, distal pulses intact, no pedal edema, mild L sided chest wall TTP, no crepitus or deformities Abd: appearance normal, nondistended MsK: moving all extremities with ease Neuro: A&O  ED Course/Procedures    Results for orders placed or performed during the hospital encounter of 01/23/19  Basic metabolic panel  Result Value Ref Range   Sodium 134 (L) 135 - 145 mmol/L   Potassium 3.8 3.5 - 5.1 mmol/L   Chloride 99 98 - 111 mmol/L   CO2 23 22 - 32 mmol/L   Glucose, Bld 109 (H) 70 - 99 mg/dL   BUN 7 6 - 20 mg/dL   Creatinine, Ser 4.540.74 0.61 - 1.24 mg/dL   Calcium 9.1 8.9 - 09.810.3 mg/dL   GFR calc non Af Amer >60 >60 mL/min   GFR calc Af Amer >60 >60 mL/min   Anion gap 12 5 - 15  CBC  Result Value Ref Range   WBC 5.6 4.0 - 10.5 K/uL   RBC 4.45 4.22 - 5.81 MIL/uL   Hemoglobin 13.7 13.0 - 17.0 g/dL   HCT 11.941.5 14.739.0 - 82.952.0 %   MCV 93.3 80.0 - 100.0 fL   MCH 30.8 26.0 - 34.0 pg   MCHC 33.0 30.0 - 36.0 g/dL   RDW 56.213.5 13.011.5 - 86.515.5 %    Platelets 256 150 - 400 K/uL   nRBC 0.0 0.0 - 0.2 %  Troponin I - ONCE - STAT  Result Value Ref Range   Troponin I <0.03 <0.03 ng/mL  Brain natriuretic peptide  Result Value Ref Range   B Natriuretic Peptide 51.0 0.0 - 100.0 pg/mL  D-dimer, quantitative ([facility])  Result Value Ref Range   D-Dimer, Quant 0.43 0.00 - 0.50 ug/mL-FEU  Troponin I - Once-Timed  Result Value Ref Range   Troponin I <0.03 <0.03 ng/mL   Dg Chest 2 View  Result Date: 01/23/2019 CLINICAL DATA:  Chest pain. EXAM: CHEST - 2 VIEW COMPARISON:  October 20, 2017 FINDINGS: The heart size and mediastinal contours are within normal limits. Both lungs are clear. The visualized skeletal structures are unremarkable. IMPRESSION: No active cardiopulmonary disease. Electronically Signed   By: Gerome Samavid  Williams III M.D   On: 01/23/2019 15:20     Meds ordered this encounter  Medications  . DISCONTD: albuterol (VENTOLIN HFA) 108 (90 Base) MCG/ACT inhaler 2 puff  . predniSONE (DELTASONE) tablet 60 mg  .  albuterol (VENTOLIN HFA) 108 (90 Base) MCG/ACT inhaler 6 puff  . AeroChamber Plus Flo-Vu Large MISC 1 each  . albuterol (VENTOLIN HFA) 108 (90 Base) MCG/ACT inhaler    Sig: Inhale 1-2 puffs into the lungs every 4 (four) hours as needed for wheezing or shortness of breath (or cough).    Dispense:  1 Inhaler    Refill:  0    Order Specific Question:   Supervising Provider    Answer:   MILLER, BRIAN [[id number]]  . predniSONE (DELTASONE) 20 MG tablet    Sig: 3 tabs po daily x 4 days starting 01/24/19    Dispense:  12 tablet    Refill:  0    Order Specific Question:   Supervising Provider    Answer:   Eber Hong [[id number]]     MDM:   ICD-10-CM   1. Bronchitis J40   2. Atypical chest pain R07.89   3. Exacerbation of asthma, unspecified asthma severity, unspecified whether persistent J45.901    8:28 PM Second trop neg. Lung sounds improved after inhaler, pt feeling better. Will send home with short prednisone burst and inhaler  for presumed asthma exacerbation. Advised other OTC remedies for symptomatic relief. F/up with PCP in 5-7 days for recheck. Strict return precautions advised. I explained the diagnosis and have given explicit precautions to return to the ER including for any other new or worsening symptoms. The patient understands and accepts the medical plan as it's been dictated and I have answered their questions. Discharge instructions concerning home care and prescriptions have been given. The patient is STABLE and is discharged to home in good condition.       [address], [place], [place] 01/23/19 2028    Gerhard Munch, MD 01/24/19 [phone]

## 2019-01-23 NOTE — ED Provider Notes (Signed)
5:11 PM Initial troponin unremarkable, initial dimer unremarkable.  On repeat exam the patient is awake and alert, sitting upright.   Gerhard Munch, MD 01/23/19 [phone]

## 2019-01-23 NOTE — ED Notes (Signed)
Patient verbalizes understanding of discharge instructions. Opportunity for questioning and answers were provided. Armband removed by staff, pt discharged from ED.  

## 2019-01-23 NOTE — Discharge Instructions (Addendum)
Continue to stay well-hydrated. Use Mucinex for cough suppression/expectoration of mucus. Use over the counter antihistamines such as zyrtec, claritin, or allegra to decrease symptoms and frequency of asthma attacks. Use inhaler as directed, as needed for cough/chest congestion/wheezing/shortness of breath/etc. Take prednisone as directed for your asthma exacerbation, starting tomorrow since you received today's dose in the ER today. Follow-up with your primary care doctor in 5-7 days for recheck of ongoing symptoms. Return to emergency department for emergent changing or worsening of symptoms.

## 2019-01-23 NOTE — ED Provider Notes (Signed)
[facility] Provider Note   CSN: [id number] Arrival date & time: 01/23/19  1323    History   Chief Complaint Chief Complaint  Patient presents with  . Chest Pain  . Shortness of Breath  . Asthma    HPI Lucillie GarfinkelStephen M Haviland is a 48 y.o. male.     HPI   48 yo m with PMHx as below here with chest pain. Pt reports that over the past 4-5 days, he has had an intermittent, dull, aching, left-sided chest pain. It seems to be correlated with mild increased cough, SOB. He's had increased wheezing and Is using his inhaler. This helps his SOB but not necessarily his chest pain. The pain is upper substernal, does not radiate. It is not pleuritic. Denies any fever, chills. No nausea, vomiting, or worsening w/ eating. It feels different from his reflux. No other complaints. No leg swelling, no h/o DVT/PE. No family h/o early CAD. Does not smoke but did previously. Called his PCP yesterday and was advised to come to ED for eval.  Past Medical History:  Diagnosis Date  . Anxiety   . Depression   . Hypertension   . Low testosterone   . Median nerve dysfunction   . Radiculopathy of cervical region   . Thyroid disease   . Ulnar neuropathy at elbow   . Weakness     Patient Active Problem List   Diagnosis Date Noted  . Asthma 01/22/2019  . GAD (generalized anxiety disorder) 01/06/2019  . GERD (gastroesophageal reflux disease) 12/23/2017  . Headache 01/22/2016  . Nicotine abuse 12/21/2015  . Benign essential HTN 12/21/2015  . Hypothyroid 12/21/2015  . Morbid obesity (HCC) 10/19/2015  . 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 Lumbar radiculopathy 08/11/2013  . 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 Hypotestosteronism 03/29/2013    Past Surgical History:  Procedure Laterality Date  . BICEPS TENDON REPAIR    . NECK SURGERY     Fusion done two seperate times  . ROTATOR CUFF REPAIR Right         Home Medications    Prior to Admission medications    Medication Sig Start Date End Date Taking? Authorizing Provider  acetaminophen (TYLENOL) 500 MG tablet Take 1,000 mg by mouth every 6 (six) hours as needed for headache (pain).     [provider]  albuterol (PROVENTIL HFA;VENTOLIN HFA) 108 (90 Base) MCG/ACT inhaler Inhale 1-2 puffs into the lungs every 6 (six) hours as needed for wheezing or shortness of breath. 07/01/18   Mechele ClaudeStacks, Warren, MD  ALPRAZolam Prudy Feeler(XANAX) 0.5 MG tablet Take 1 tablet (0.5 mg total) by mouth 3 (three) times daily as needed for anxiety. 01/06/19   Mechele ClaudeStacks, Warren, MD  budesonide-formoterol (SYMBICORT) 160-4.5 MCG/ACT inhaler INHALE TWO PUFFS BY MOUTH TWICE DAILY FOR COPD/ASTHMA 01/06/19   Mechele ClaudeStacks, Warren, MD  carvedilol (COREG) 6.25 MG tablet Take 1 tablet (6.25 mg total) by mouth 2 (two) times daily with a meal. 01/06/19   Mechele ClaudeStacks, Warren, MD  celecoxib (CELEBREX) 200 MG capsule Take 1 capsule (200 mg total) by mouth daily. For arthritis 01/06/19   Mechele ClaudeStacks, Warren, MD  dexlansoprazole (DEXILANT) 60 MG capsule Take 1 capsule (60 mg total) by mouth daily. 01/06/19   Mechele ClaudeStacks, Warren, MD  DULoxetine (CYMBALTA) 60 MG capsule Take 2 capsules (120 mg total) by mouth daily. 07/01/18   Mechele ClaudeStacks, Warren, MD  famotidine (PEPCID) 20 MG tablet Take 1 tablet (20 mg total) by mouth 2 (two) times daily for 14  days. 07/20/18 08/03/18  Sonny Masters, FNP  gabapentin (NEURONTIN) 300 MG capsule Take 1 capsule (300 mg total) by mouth 3 (three) times daily. 07/01/18   Mechele Claude, MD  gemfibrozil (LOPID) 600 MG tablet Take 1 tablet (600 mg total) by mouth 2 (two) times daily. 01/06/19   Mechele Claude, MD  levothyroxine (SYNTHROID, LEVOTHROID) 50 MCG tablet Take 1 tablet (50 mcg total) by mouth daily. 01/06/19   Mechele Claude, MD  linaclotide Karlene Einstein) 290 MCG CAPS capsule Take 1 capsule (290 mcg total) by mouth daily before breakfast. 01/06/19   Mechele Claude, MD  Omega-3 Fatty Acids (FISH OIL) 1000 MG CAPS Take 1,000 mg by mouth daily.     [provider]  polyethylene glycol powder (GLYCOLAX/MIRALAX) powder Take 1 capful daily. 07/24/17   Mechele Claude, MD  sildenafil (REVATIO) 20 MG tablet Take 2-5 tablets daily as needed for sex 11/03/18   Mechele Claude, MD  Testosterone (ANDROGEL PUMP) 20.25 MG/ACT (1.62%) GEL Apply 4 pumps daily to upper chest and shoulders 01/06/19   Mechele Claude, MD  traZODone (DESYREL) 100 MG tablet Take 1 tablet (100 mg total) by mouth at bedtime. 01/06/19   Mechele Claude, MD  varenicline (CHANTIX CONTINUING MONTH PAK) 1 MG tablet Take 1 tablet (1 mg total) by mouth daily. 02/18/18   Mechele Claude, MD    Family History Family History  Problem Relation Age of Onset  . Hypertension Mother   . Stroke Mother 64  . Arrhythmia Father   . COPD Father   . Asthma Brother   . Bipolar disorder Brother   . Asthma Daughter   . Fibromyalgia Sister     Social History Social History   Tobacco Use  . Smoking status: Former Smoker    Packs/day: 0.50    Years: 30.00    Pack years: 15.00    Types: Cigarettes    Last attempt to quit: 07/23/2017    Years since quitting: 1.5  . Smokeless tobacco: Former Neurosurgeon    Types: Snuff, Chew  Substance Use Topics  . Alcohol use: No  . Drug use: No     Allergies   Penicillins and Chantix [varenicline]   Review of Systems Review of Systems  Constitutional: Positive for fatigue. Negative for chills and fever.  HENT: Negative for congestion and rhinorrhea.   Eyes: Negative for visual disturbance.  Respiratory: Positive for cough, chest tightness and wheezing. Negative for shortness of breath.   Cardiovascular: Positive for chest pain. Negative for leg swelling.  Gastrointestinal: Negative for abdominal pain, diarrhea, nausea and vomiting.  Genitourinary: Negative for dysuria and flank pain.  Musculoskeletal: Negative for neck pain and neck stiffness.  Skin: Negative for rash and wound.  Allergic/Immunologic: Negative for immunocompromised state.   Neurological: Negative for syncope, weakness and headaches.  All other systems reviewed and are negative.    Physical Exam Updated Vital Signs BP (!) 148/91   Pulse 61   Temp 98.3 F (36.8 C) (Oral)   Resp 14   Ht  (1.727 m)   Wt (!) 149.7 kg   SpO2 100%   BMI 50.18 kg/m   Physical Exam Vitals signs and nursing note reviewed.  Constitutional:      General: He is not in acute distress.    Appearance: He is well-developed.  HENT:     Head: Normocephalic and atraumatic.  Eyes:     Conjunctiva/sclera: Conjunctivae normal.  Neck:     Musculoskeletal: Neck supple.  Cardiovascular:  Rate and Rhythm: Normal rate and regular rhythm.     Heart sounds: Normal heart sounds. No murmur. No friction rub.  Pulmonary:     Effort: Pulmonary effort is normal. No respiratory distress.     Breath sounds: Wheezing (mild, expiratory) present. No rales.  Abdominal:     General: There is no distension.     Palpations: Abdomen is soft.     Tenderness: There is no abdominal tenderness.  Skin:    General: Skin is warm.     Capillary Refill: Capillary refill takes less than 2 seconds.  Neurological:     Mental Status: He is alert and oriented to person, place, and time.     Motor: No abnormal muscle tone.      ED Treatments / Results  Labs (all labs ordered are listed, but only abnormal results are displayed) Labs Reviewed  BASIC METABOLIC PANEL  CBC  TROPONIN I  BRAIN NATRIURETIC PEPTIDE  D-DIMER, QUANTITATIVE (NOT AT [facility])    EKG EKG Interpretation  Date/Time:  Saturday Jan 23 2019 13:33:51 EDT Ventricular Rate:  69 PR Interval:    QRS Duration: 92 QT Interval:  408 QTC Calculation: 438 R Axis:   -32 Text Interpretation:  Sinus rhythm Left axis deviation No significant change since last tracing Confirmed by Shaune Pollack [phone]) on 01/23/2019 1:47:16 PM   Radiology No results found.  Procedures Procedures (including critical care time)  Medications Ordered  in ED Medications  predniSONE (DELTASONE) tablet 60 mg (has no administration in time range)  albuterol (VENTOLIN HFA) 108 (90 Base) MCG/ACT inhaler 6 puff (has no administration in time range)  AeroChamber Plus Flo-Vu Large MISC 1 each (has no administration in time range)     Initial Impression / Assessment and Plan / ED Course  I have reviewed the triage vital signs and the nursing notes.  Pertinent labs & imaging results that were available during my care of the patient were reviewed by me and considered in my medical decision making (see chart for details).        48 yo M here with atypical CP, mild wheezing. I suspect this is 2/2 bronchitis/chest wall pain from asthma exacerbation, less likely ACS. Pt aalso with mild SOB/pleurisy and is beta blocked so cannot PERC - will check D-Dimer.   Plan to tx with prednisone, +/- ABX pending CXR. F/u delta trop, D-Dimer.  Pain not c/w dissection.  Patient care transferred to Dr. Jeraldine Loots at the end of my shift. Patient presentation, ED course, and plan of care discussed with review of all pertinent labs and imaging. Please see his/her note for further details regarding further ED course and disposition.   Final Clinical Impressions(s) / ED Diagnoses   Final diagnoses:  Bronchitis  Atypical chest pain    ED Discharge Orders    None       Shaune Pollack, MD 01/23/19 [phone]

## 2019-01-25 ENCOUNTER — Telehealth: Payer: Self-pay | Admitting: Family Medicine

## 2019-01-26 ENCOUNTER — Encounter: Payer: Self-pay | Admitting: Family Medicine

## 2019-01-26 ENCOUNTER — Ambulatory Visit (INDEPENDENT_AMBULATORY_CARE_PROVIDER_SITE_OTHER): Payer: Medicare HMO | Admitting: Family Medicine

## 2019-01-26 ENCOUNTER — Other Ambulatory Visit: Payer: Self-pay

## 2019-01-26 DIAGNOSIS — J4521 Mild intermittent asthma with (acute) exacerbation: Secondary | ICD-10-CM | POA: Diagnosis not present

## 2019-01-26 DIAGNOSIS — M5412 Radiculopathy, cervical region: Secondary | ICD-10-CM | POA: Diagnosis not present

## 2019-01-26 DIAGNOSIS — F411 Generalized anxiety disorder: Secondary | ICD-10-CM | POA: Diagnosis not present

## 2019-01-26 MED ORDER — PREDNISONE 20 MG PO TABS: ORAL_TABLET | ORAL | 0 refills | Status: DC

## 2019-01-26 NOTE — Progress Notes (Signed)
Subjective:    Patient ID: Roy [name], male    DOB: 08/01/1971, 48 y.o.   MRN: [id number]   HPI: Roy [name] is a 48 y.o. male presenting for Chest pain and dyspnea starting a week ago. Sx increased until on 5/2 he went to E.D. Troponin tests were negative. Also check for P.E. was negative.   Current sx include back pain. Right shoulder blade and under right arm. Some chest pain yesterday. Denies dyspnea today. Had some for several days before. Worst was 4 days ago. Taking prednisone 60 mg a day. Last dose due tomorrow. No pleuritic component.   E.R. record reviewed.   [facility] 07/20/2018 07/01/2018 02/19/2018 02/18/2018 12/23/2017  Decreased Interest 0 0 1 2 0  Down, Depressed, Hopeless 0 0 1 2 0  PHQ - 2 Score 0 0 2 4 0  Altered sleeping - - 0 0 -  Tired, decreased energy - - 2 2 -  Change in appetite - - - 0 -  Feeling bad or failure about yourself  - - 1 0 -  Trouble concentrating - - 0 0 -  Moving slowly or fidgety/restless - - 0 0 -  Suicidal thoughts - - 0 0 -  PHQ-9 Score - - 5 6 -  Difficult doing work/chores - - Somewhat difficult - -  Some recent data might be hidden     Relevant past medical, surgical, family and social history reviewed and updated as indicated.  Interim medical history since our last visit reviewed. Allergies and medications reviewed and updated.  ROS:  Review of Systems  Constitutional: Negative.   HENT: Negative.   Eyes: Negative for visual disturbance.  Respiratory: Positive for cough and shortness of breath.   Cardiovascular: Positive for chest pain. Negative for leg swelling.  Gastrointestinal: Negative for abdominal pain, diarrhea, nausea and vomiting.  Genitourinary: Negative for difficulty urinating.  Musculoskeletal: Positive for back pain. Negative for arthralgias and myalgias.  Skin: Negative for rash.  Neurological: Negative for headaches.  Psychiatric/Behavioral: Negative for sleep disturbance.      Social History   Tobacco Use  Smoking Status Former Smoker  . Packs/day: 0.50  . Years: 30.00  . Pack years: 15.00  . Types: Cigarettes  . Last attempt to quit: 07/23/2017  . Years since quitting: 1.5  Smokeless Tobacco Former NeurosurgeonUser  . Types: Snuff, Chew       Objective:     Wt Readings from Last 3 Encounters:  01/23/19 (!) 330 lb (149.7 kg)  07/20/18 (!) 346 lb (156.9 kg)  07/01/18 (!) 349 lb 3.2 oz (158.4 kg)     Exam deferred. Pt. Harboring due to COVID 19. Phone visit performed.   Assessment & Plan:   1. Mild intermittent asthma with acute exacerbation   2. GAD (generalized anxiety disorder)   3. Cervical nerve root disorder   4. Morbid obesity (HCC)     Meds ordered this encounter  Medications  . predniSONE (DELTASONE) 20 MG tablet    Sig: Take two daily from 5/7 thru 5/9. Then one a day 5/10 -5/12    Dispense:  9 tablet    Refill:  0    No orders of the defined types were placed in this encounter.     Diagnoses and all orders for this visit:  Mild intermittent asthma with acute exacerbation  GAD (generalized anxiety disorder)  Cervical nerve root disorder  Morbid obesity (HCC)  Other orders -  predniSONE (DELTASONE) 20 MG tablet; Take two daily from 5/7 thru 5/9. Then one a day 5/10 -5/12    Virtual Visit via telephone Note  I discussed the limitations, risks, security and privacy concerns of performing an evaluation and management service by telephone and the availability of in person appointments. The patient was identified with two identifiers. Pt.expressed understanding and agreed to proceed. Pt. Is at home. Dr. Darlyn Read is in his office.  Follow Up Instructions:   I discussed the assessment and treatment plan with the patient. The patient was provided an opportunity to ask questions and all were answered. The patient agreed with the plan and demonstrated an understanding of the instructions.   The patient was advised to call back or  seek an in-person evaluation if the symptoms worsen or if the condition fails to improve as anticipated.  Total minutes including chart review and phone contact time: 40   Follow up plan: Return in about 1 month (around 02/26/2019), or if symptoms worsen or fail to improve.  Mechele Claude, MD [place] [facility]

## 2019-02-02 ENCOUNTER — Other Ambulatory Visit: Payer: Self-pay | Admitting: Family Medicine

## 2019-03-31 ENCOUNTER — Other Ambulatory Visit: Payer: Self-pay | Admitting: Family Medicine

## 2019-03-31 DIAGNOSIS — M5416 Radiculopathy, lumbar region: Secondary | ICD-10-CM

## 2019-04-14 ENCOUNTER — Other Ambulatory Visit: Payer: Self-pay

## 2019-04-14 ENCOUNTER — Other Ambulatory Visit: Payer: Medicare HMO

## 2019-04-14 ENCOUNTER — Ambulatory Visit (INDEPENDENT_AMBULATORY_CARE_PROVIDER_SITE_OTHER): Payer: Medicare HMO | Admitting: Family Medicine

## 2019-04-14 DIAGNOSIS — Z20822 Contact with and (suspected) exposure to covid-19: Secondary | ICD-10-CM

## 2019-04-14 DIAGNOSIS — Z20828 Contact with and (suspected) exposure to other viral communicable diseases: Secondary | ICD-10-CM

## 2019-04-14 NOTE — Patient Instructions (Signed)
Prevent the Spread of COVID-19 if You Are Sick If you are sick with COVID-19 or think you might have COVID-19, follow the steps below to help protect other people in your home and community. Stay home except to get medical care.  Stay home. Most people with COVID-19 have mild illness and are able to recover at home without medical care. Do not leave your home, except to get medical care. Do not visit public areas.  Take care of yourself. Get rest and stay hydrated.  Get medical care when needed. Call your doctor before you go to their office for care. But, if you have trouble breathing or other concerning symptoms, call 911 for immediate help.  Avoid public transportation, ride-sharing, or taxis. Separate yourself from other people and pets in your home.  As much as possible, stay in a specific room and away from other people and pets in your home. Also, you should use a separate bathroom, if available. If you need to be around other people or animals in or outside of the home, wear a cloth face covering. ? See COVID-19 and Animals if you have questions about pets: https://www.cdc.gov/coronavirus/2019-ncov/faq.html#COVID19animals Monitor your symptoms.  Common symptoms of COVID-19 include fever and cough. Trouble breathing is a more serious symptom that means you should get medical attention.  Follow care instructions from your healthcare provider and local health department. Your local health authorities will give instructions on checking your symptoms and reporting information. If you develop emergency warning signs for COVID-19 get medical attention immediately.  Emergency warning signs include*:  Trouble breathing  Persistent pain or pressure in the chest  New confusion or not able to be woken  Bluish lips or face *This list is not all inclusive. Please consult your medical provider for any other symptoms that are severe or concerning to you. Call 911 if you have a medical  emergency. If you have a medical emergency and need to call 911, notify the operator that you have or think you might have, COVID-19. If possible, put on a facemask before medical help arrives. Call ahead before visiting your doctor.  Call ahead. Many medical visits for routine care are being postponed or done by phone or telemedicine.  If you have a medical appointment that cannot be postponed, call your doctor's office. This will help the office protect themselves and other patients. If you are sick, wear a cloth covering over your nose and mouth.  You should wear a cloth face covering over your nose and mouth if you must be around other people or animals, including pets (even at home).  You don't need to wear the cloth face covering if you are alone. If you can't put on a cloth face covering (because of trouble breathing for example), cover your coughs and sneezes in some other way. Try to stay at least 6 feet away from other people. This will help protect the people around you. Note: During the COVID-19 pandemic, medical grade facemasks are reserved for healthcare workers and some first responders. You may need to make a cloth face covering using a scarf or bandana. Cover your coughs and sneezes.  Cover your mouth and nose with a tissue when you cough or sneeze.  Throw used tissues in a lined trash can.  Immediately wash your hands with soap and water for at least 20 seconds. If soap and water are not available, clean your hands with an alcohol-based hand sanitizer that contains at least 60% alcohol. Clean your hands often.    Wash your hands often with soap and water for at least 20 seconds. This is especially important after blowing your nose, coughing, or sneezing; going to the bathroom; and before eating or preparing food.  Use hand sanitizer if soap and water are not available. Use an alcohol-based hand sanitizer with at least 60% alcohol, covering all surfaces of your hands and rubbing  them together until they feel dry.  Soap and water are the best option, especially if your hands are visibly dirty.  Avoid touching your eyes, nose, and mouth with unwashed hands. Avoid sharing personal household items.  Do not share dishes, drinking glasses, cups, eating utensils, towels, or bedding with other people in your home.  Wash these items thoroughly after using them with soap and water or put them in the dishwasher. Clean all "high-touch" surfaces everyday.  Clean and disinfect high-touch surfaces in your "sick room" and bathroom. Let someone else clean and disinfect surfaces in common areas, but not your bedroom and bathroom.  If a caregiver or other person needs to clean and disinfect a sick person's bedroom or bathroom, they should do so on an as-needed basis. The caregiver/other person should wear a mask and wait as long as possible after the sick person has used the bathroom. High-touch surfaces include phones, remote controls, counters, tabletops, doorknobs, bathroom fixtures, toilets, keyboards, tablets, and bedside tables.  Clean and disinfect areas that may have blood, stool, or body fluids on them.  Use household cleaners and disinfectants. Clean the area or item with soap and water or another detergent if it is dirty. Then use a household disinfectant. ? Be sure to follow the instructions on the label to ensure safe and effective use of the product. Many products recommend keeping the surface wet for several minutes to ensure germs are killed. Many also recommend precautions such as wearing gloves and making sure you have good ventilation during use of the product. ? Most EPA-registered household disinfectants should be effective. How to discontinue home isolation  People with COVID-19 who have stayed home (home isolated) can stop home isolation under the following conditions: ? If you will not have a test to determine if you are still contagious, you can leave home  after these three things have happened:  You have had no fever for at least 72 hours (that is three full days of no fever without the use of medicine that reduces fevers) AND  other symptoms have improved (for example, when your cough or shortness of breath has improved) AND  at least 10 days have passed since your symptoms first appeared. ? If you will be tested to determine if you are still contagious, you can leave home after these three things have happened:  You no longer have a fever (without the use of medicine that reduces fevers) AND  other symptoms have improved (for example, when your cough or shortness of breath has improved) AND  you received two negative tests in a row, 24 hours apart. Your doctor will follow CDC guidelines. In all cases, follow the guidance of your healthcare provider and local health department. The decision to stop home isolation should be made in consultation with your healthcare provider and state and local health departments. Local decisions depend on local circumstances. cdc.gov/coronavirus 01/24/2019 This information is not intended to replace advice given to you by your health care provider. Make sure you discuss any questions you have with your health care provider. Document Released: 01/05/2019 Document Revised: 02/03/2019 Document Reviewed: 01/05/2019   Elsevier Patient Education  2020 [employer]  

## 2019-04-14 NOTE — Progress Notes (Signed)
Telephone visit  Subjective: CC: COVID19 exposure PCP: Mechele ClaudeStacks, Warren, MD JXB:JYNWGNFHPI:Roy [name] is a 48 y.o. male calls for telephone consult today. Patient provides verbal consent for consult held via phone.  Location of patient: home  Location of provider: [facility] Others present for call: none  1. COVID19 exposure Patient reports that patient's wife works at [employer] in [place] and her boss tested positive. They have since shut the whole restaurant down. His wife is going to get tested at [facility] and he is asking to have a test done as well.  He reports fatigue.  He occasionally has cough but also has history of asthma.  No fevers, chills, nausea, vomiting, headache, rash or shortness of breath.   ROS: Per HPI  Allergies  Allergen Reactions  . Penicillins Rash and Other (See Comments)    Has patient had a PCN reaction causing immediate rash, facial/tongue/throat swelling, SOB or lightheadedness with hypotension: Yes Has patient had a PCN reaction causing severe rash involving mucus membranes or skin necrosis: No Has patient had a PCN reaction that required hospitalization: No Has patient had a PCN reaction occurring within the last 10 years: No If all of the above answers are "NO", then may proceed with Cephalosporin use. Has patient had a PCN reaction causing immediate rash, facial/tongue/throat swelling, SOB or lightheadedness with hypotension: Yes Has patient had a PCN reaction causing severe rash involving mucus membranes or skin necrosis: No Has patient had a PCN reaction that required hospitalization: No Has patient had a PCN reaction occurring within the last 10 years: No If all of the above answers are "NO", then may proceed with Cephalosporin use. unknown   . Chantix [Varenicline] Other (See Comments)    Severe headache   Past Medical History:  Diagnosis Date  . Anxiety   . Depression   . Hypertension   . Low testosterone   . Median nerve dysfunction   . Radiculopathy  of cervical region   . Thyroid disease   . Ulnar neuropathy at elbow   . Weakness     Current Outpatient Medications:  .  acetaminophen (TYLENOL) 500 MG tablet, Take 1,000 mg by mouth every 6 (six) hours as needed for headache (pain). , Disp: , Rfl:  .  albuterol (PROVENTIL HFA;VENTOLIN HFA) 108 (90 Base) MCG/ACT inhaler, Inhale 1-2 puffs into the lungs every 6 (six) hours as needed for wheezing or shortness of breath., Disp: 1 Inhaler, Rfl: 2 .  albuterol (VENTOLIN HFA) 108 (90 Base) MCG/ACT inhaler, Inhale 1-2 puffs into the lungs every 4 (four) hours as needed for wheezing or shortness of breath (or cough)., Disp: 1 Inhaler, Rfl: 0 .  ALPRAZolam (XANAX) 0.5 MG tablet, Take 1 tablet (0.5 mg total) by mouth 3 (three) times daily as needed for anxiety., Disp: 90 tablet, Rfl: 5 .  budesonide-formoterol (SYMBICORT) 160-4.5 MCG/ACT inhaler, INHALE TWO PUFFS BY MOUTH TWICE DAILY FOR COPD/ASTHMA (Patient taking differently: Inhale 2 puffs into the lungs 2 (two) times a day. ), Disp: 10.2 g, Rfl: 11 .  carvedilol (COREG) 6.25 MG tablet, Take 1 tablet (6.25 mg total) by mouth 2 (two) times daily with a meal., Disp: 180 tablet, Rfl: 1 .  celecoxib (CELEBREX) 200 MG capsule, Take 1 capsule (200 mg total) by mouth daily. For arthritis, Disp: 90 capsule, Rfl: 3 .  dexlansoprazole (DEXILANT) 60 MG capsule, Take 1 capsule (60 mg total) by mouth daily., Disp: 90 capsule, Rfl: 1 .  DULoxetine (CYMBALTA) 60 MG capsule, Take 2 capsules  by mouth once daily, Disp: 180 capsule, Rfl: 0 .  famotidine (PEPCID) 20 MG tablet, Take 1 tablet (20 mg total) by mouth 2 (two) times daily for 14 days. (Patient not taking: Reported on 01/23/2019), Disp: 28 tablet, Rfl: 0 .  gabapentin (NEURONTIN) 300 MG capsule, Take 1 capsule (300 mg total) by mouth 3 (three) times daily., Disp: 270 capsule, Rfl: 1 .  gemfibrozil (LOPID) 600 MG tablet, Take 1 tablet (600 mg total) by mouth 2 (two) times daily., Disp: 60 tablet, Rfl: 5 .   levothyroxine (SYNTHROID, LEVOTHROID) 50 MCG tablet, Take 1 tablet (50 mcg total) by mouth daily., Disp: 90 tablet, Rfl: 5 .  linaclotide (LINZESS) 290 MCG CAPS capsule, Take 1 capsule (290 mcg total) by mouth daily before breakfast. (Patient taking differently: Take 290 mcg by mouth daily as needed (constipation). ), Disp: 90 capsule, Rfl: 1 .  Omega-3 Fatty Acids (FISH OIL) 1000 MG CAPS, Take 1,000 mg by mouth daily. , Disp: , Rfl:  .  polyethylene glycol powder (GLYCOLAX/MIRALAX) powder, Take 1 capful daily. (Patient taking differently: Take 17 g by mouth daily as needed for mild constipation or moderate constipation. Mix 1 capful with 8 oz of water daily as needed for constipation), Disp: 3350 g, Rfl: 1 .  predniSONE (DELTASONE) 20 MG tablet, Take two daily from 5/7 thru 5/9. Then one a day 5/10 -5/12, Disp: 9 tablet, Rfl: 0 .  sildenafil (REVATIO) 20 MG tablet, Take 2-5 tablets daily as needed for sex, Disp: 180 tablet, Rfl: 1 .  Testosterone (ANDROGEL PUMP) 20.25 MG/ACT (1.62%) GEL, Apply 4 pumps daily to upper chest and shoulders (Patient not taking: Reported on 01/23/2019), Disp: 300 g, Rfl: 5 .  traZODone (DESYREL) 100 MG tablet, Take 1 tablet (100 mg total) by mouth at bedtime., Disp: 90 tablet, Rfl: 3 .  varenicline (CHANTIX CONTINUING MONTH PAK) 1 MG tablet, Take 1 tablet (1 mg total) by mouth daily. (Patient not taking: Reported on 01/23/2019), Disp: 30 tablet, Rfl: 5  Gen: does not sound distressed Pulm: coughs intermittently  Assessment/ Plan: 48 y.o. male   1. Exposure to Covid-19 Virus Given possible exposure to coronavirus I have recommended that he proceed with testing.  I have placed an order and instructed him to report to the [facility] building in [place] for testing before 330 today.  He voiced good understanding will head over there now.  We discussed reasons for emergent evaluation and reevaluation.  We discussed isolation until he has had a negative test.  He voiced good  understanding will follow-up PRN - Novel Coronavirus, N[facility] ([facility])   Start time: 12:40pm End time: 12:43pm  Total time spent on patient care (including telephone call/ virtual visit): 10 minutes  [place], [place] [phone]

## 2019-04-19 ENCOUNTER — Telehealth: Payer: Self-pay | Admitting: Family Medicine

## 2019-04-19 NOTE — Chronic Care Management (AMB) (Signed)
Chronic Care Management   Note  04/19/2019 Name: JAZIEL BENNETT MRN: [id number] DOB: 1971-04-03  KITAI PURDOM is a 48 y.o. year old male who is a primary care patient of Stacks, Cletus Gash, MD. I reached out to Talmadge [employer] by phone today in response to a referral sent by Mr. Lonell Stamos [facility] health plan.    Mr. Warriner was given information about Chronic Care Management services today including:  1. CCM service includes personalized support from designated clinical staff supervised by his physician, including individualized plan of care and coordination with other care providers 2. 24/7 contact phone numbers for assistance for urgent and routine care needs. 3. Service will only be billed when office clinical staff spend 20 minutes or more in a month to coordinate care. 4. Only one practitioner may furnish and bill the service in a calendar month. 5. The patient may stop CCM services at any time (effective at the end of the month) by phone call to the office staff. 6. The patient will be responsible for cost sharing (co-pay) of up to 20% of the service fee (after annual deductible is met).  Patient agreed to services and verbal consent obtained.   Follow up plan: Telephone appointment with CCM team member scheduled for: 05/06/2019  [place]  ??[email or web address]   ??[phone]

## 2019-04-20 LAB — NOVEL CORONAVIRUS, NAA: SARS-CoV-2, NAA: NOT DETECTED

## 2019-05-06 ENCOUNTER — Ambulatory Visit: Payer: Medicare HMO | Admitting: *Deleted

## 2019-05-06 DIAGNOSIS — J4521 Mild intermittent asthma with (acute) exacerbation: Secondary | ICD-10-CM

## 2019-05-06 DIAGNOSIS — I10 Essential (primary) hypertension: Secondary | ICD-10-CM

## 2019-05-06 DIAGNOSIS — K2100 Gastro-esophageal reflux disease with esophagitis, without bleeding: Secondary | ICD-10-CM

## 2019-05-06 DIAGNOSIS — E039 Hypothyroidism, unspecified: Secondary | ICD-10-CM

## 2019-05-06 NOTE — Chronic Care Management (AMB) (Signed)
  Chronic Care Management   Outreach Note  05/06/2019 Name: Roy [name] MRN: [id number] DOB: 13-Aug-1971  PCP: Claretta Fraise, MD Reason for referral : Chronic Care Management   An unsuccessful telephone outreach was attempted today. The patient was referred to the case management team by his health plan for assistance with chronic care management and care coordination. Chart review performed in preparation for initial visit. Conditions that may need CCM attention: hypertension, asthma, GERD, hypothyroidism, and depression.   Follow Up Plan: A HIPPA compliant phone message was left for the patient providing contact information and requesting a return call.  The care management team will reach out to the patient again over the next 30 days.    Chong Sicilian BSN, RN-BC Embedded Chronic Care Manager [facility] Direct Dial: [phone]

## 2019-05-13 ENCOUNTER — Ambulatory Visit (INDEPENDENT_AMBULATORY_CARE_PROVIDER_SITE_OTHER): Payer: Medicare HMO | Admitting: Family Medicine

## 2019-05-13 ENCOUNTER — Encounter: Payer: Self-pay | Admitting: Family Medicine

## 2019-05-13 DIAGNOSIS — R1030 Lower abdominal pain, unspecified: Secondary | ICD-10-CM

## 2019-05-13 DIAGNOSIS — R350 Frequency of micturition: Secondary | ICD-10-CM | POA: Diagnosis not present

## 2019-05-13 DIAGNOSIS — N5082 Scrotal pain: Secondary | ICD-10-CM

## 2019-05-13 DIAGNOSIS — R300 Dysuria: Secondary | ICD-10-CM | POA: Diagnosis not present

## 2019-05-13 MED ORDER — LEVOFLOXACIN 500 MG PO TABS: 500.0000 mg | ORAL_TABLET | Freq: Every day | ORAL | 0 refills | Status: AC

## 2019-05-13 NOTE — Progress Notes (Signed)
Virtual Visit via telephone Note Due to COVID-19 pandemic this visit was conducted virtually. This visit type was conducted due to national recommendations for restrictions regarding the COVID-19 Pandemic (e.g. social distancing, sheltering in place) in an effort to limit this patient's exposure and mitigate transmission in our community. All issues noted in this document were discussed and addressed.  A physical exam was not performed with this format.   I connected with Roy GarfinkelStephen M [name] on 05/13/19 at 1500 by telephone and verified that I am speaking with the correct person using two identifiers. Roy [name] is currently located at home and family is currently with them during visit. The provider, Kari BaarsMichelle , FNP is located in their office at time of visit.  I discussed the limitations, risks, security and privacy concerns of performing an evaluation and management service by telephone and the availability of in person appointments. I also discussed with the patient that there may be a patient responsible charge related to this service. The patient expressed understanding and agreed to proceed.  Subjective:  Patient ID: Roy [name], male    DOB: 03/04/1971, 48 y.o.   MRN: [id number]  Chief Complaint:  Abdominal Pain (radiates into groin) and Urinary Urgency   HPI: Roy GarfinkelStephen M [name] is a 48 y.o. male presenting on 05/13/2019 for Abdominal Pain (radiates into groin) and Urinary Urgency   Pt reports lower abdominal pressure that radiates into his scrotum. States this started yesterday and is not improving. He states he has urinary frequency, urgency, and dysuria also. He denies penile pain, lesions, discharge, or bleeding. No hematuria. No swelling, tenderness, erythema, discoloration, or change in feel of scrotum or testicles. States his scrotum just aches. He has dysuria with every void. No fever, chills, weakness, back pain, or fatigue. No confusion. Sexually active with wife only. No  recent coitus. Has not noticed blood in semen.   Abdominal Pain This is a new problem. The current episode started yesterday. The onset quality is gradual. The problem occurs intermittently. The problem has been waxing and waning. The pain is located in the suprapubic region. The pain is at a severity of 3/10. The pain is mild. The quality of the pain is a sensation of fullness and dull (pressure). The abdominal pain radiates to the scrotum. Associated symptoms include dysuria and frequency. Pertinent negatives include no anorexia, arthralgias, belching, constipation, diarrhea, fever, flatus, headaches, hematochezia, hematuria, melena, myalgias, nausea, vomiting or weight loss. The pain is aggravated by urination. The pain is relieved by nothing. He has tried nothing for the symptoms.     Relevant past medical, surgical, family, and social history reviewed and updated as indicated.  Allergies and medications reviewed and updated.   Past Medical History:  Diagnosis Date  . Anxiety   . Depression   . Hypertension   . Low testosterone   . Median nerve dysfunction   . Radiculopathy of cervical region   . Thyroid disease   . Ulnar neuropathy at elbow   . Weakness     Past Surgical History:  Procedure Laterality Date  . BICEPS TENDON REPAIR    . NECK SURGERY     Fusion done two seperate times  . ROTATOR CUFF REPAIR Right     Social History   Socioeconomic History  . Marital status: Married    Spouse name: Not on file  . Number of children: 1  . Years of education: GED  . Highest education level: GED or equivalent  Occupational History  .  Occupation: Disability    Comment: Worked as Arboriculturistautomotive techinician  Social Needs  . Financial resource strain: Not hard at all  . Food insecurity    Worry: Never true    Inability: Never true  . Transportation needs    Medical: No    Non-medical: No  Tobacco Use  . Smoking status: Former Smoker    Packs/day: 0.50    Years: 30.00     Pack years: 15.00    Types: Cigarettes    Quit date: 07/23/2017    Years since quitting: 1.8  . Smokeless tobacco: Former NeurosurgeonUser    Types: Snuff, Chew  Substance and Sexual Activity  . Alcohol use: No  . Drug use: No  . Sexual activity: Yes  Lifestyle  . Physical activity    Days per week: Not on file    Minutes per session: Not on file  . Stress: Not on file  Relationships  . Social Musicianconnections    Talks on phone: Not on file    Gets together: Not on file    Attends religious service: Not on file    Active member of club or organization: Not on file    Attends meetings of clubs or organizations: Not on file    Relationship status: Not on file  . Intimate partner violence    Fear of current or ex partner: Not on file    Emotionally abused: Not on file    Physically abused: Not on file    Forced sexual activity: Not on file  Other Topics Concern  . Not on file  Social History Narrative  . Not on file    Outpatient Encounter Medications as of 05/13/2019  Medication Sig  . acetaminophen (TYLENOL) 500 MG tablet Take 1,000 mg by mouth every 6 (six) hours as needed for headache (pain).   Marland Kitchen. albuterol (PROVENTIL HFA;VENTOLIN HFA) 108 (90 Base) MCG/ACT inhaler Inhale 1-2 puffs into the lungs every 6 (six) hours as needed for wheezing or shortness of breath.  Marland Kitchen. albuterol (VENTOLIN HFA) 108 (90 Base) MCG/ACT inhaler Inhale 1-2 puffs into the lungs every 4 (four) hours as needed for wheezing or shortness of breath (or cough).  . ALPRAZolam (XANAX) 0.5 MG tablet Take 1 tablet (0.5 mg total) by mouth 3 (three) times daily as needed for anxiety.  . budesonide-formoterol (SYMBICORT) 160-4.5 MCG/ACT inhaler INHALE TWO PUFFS BY MOUTH TWICE DAILY FOR COPD/ASTHMA (Patient taking differently: Inhale 2 puffs into the lungs 2 (two) times a day. )  . carvedilol (COREG) 6.25 MG tablet Take 1 tablet (6.25 mg total) by mouth 2 (two) times daily with a meal.  . celecoxib (CELEBREX) 200 MG capsule Take 1  capsule (200 mg total) by mouth daily. For arthritis  . dexlansoprazole (DEXILANT) 60 MG capsule Take 1 capsule (60 mg total) by mouth daily.  . DULoxetine (CYMBALTA) 60 MG capsule Take 2 capsules by mouth once daily  . famotidine (PEPCID) 20 MG tablet Take 1 tablet (20 mg total) by mouth 2 (two) times daily for 14 days. (Patient not taking: Reported on 01/23/2019)  . Flaxseed, Linseed, (FLAXSEED OIL) 1000 MG CAPS Take by mouth.  . gabapentin (NEURONTIN) 300 MG capsule Take 1 capsule (300 mg total) by mouth 3 (three) times daily.  Marland Kitchen. gemfibrozil (LOPID) 600 MG tablet Take 1 tablet (600 mg total) by mouth 2 (two) times daily.  Marland Kitchen. levofloxacin (LEVAQUIN) 500 MG tablet Take 1 tablet (500 mg total) by mouth daily for 10 days.  Marland Kitchen. levothyroxine (  SYNTHROID, LEVOTHROID) 50 MCG tablet Take 1 tablet (50 mcg total) by mouth daily.  Marland Kitchen linaclotide (LINZESS) 290 MCG CAPS capsule Take 1 capsule (290 mcg total) by mouth daily before breakfast. (Patient taking differently: Take 290 mcg by mouth daily as needed (constipation). )  . Omega-3 Fatty Acids (FISH OIL) 1000 MG CAPS Take 1,000 mg by mouth daily.   . polyethylene glycol powder (GLYCOLAX/MIRALAX) powder Take 1 capful daily. (Patient taking differently: Take 17 g by mouth daily as needed for mild constipation or moderate constipation. Mix 1 capful with 8 oz of water daily as needed for constipation)  . predniSONE (DELTASONE) 20 MG tablet Take two daily from 5/7 thru 5/9. Then one a day 5/10 -5/12  . sildenafil (REVATIO) 20 MG tablet Take 2-5 tablets daily as needed for sex  . Testosterone (ANDROGEL PUMP) 20.25 MG/ACT (1.62%) GEL Apply 4 pumps daily to upper chest and shoulders (Patient not taking: Reported on 01/23/2019)  . traZODone (DESYREL) 100 MG tablet Take 1 tablet (100 mg total) by mouth at bedtime.  . varenicline (CHANTIX CONTINUING MONTH PAK) 1 MG tablet Take 1 tablet (1 mg total) by mouth daily. (Patient not taking: Reported on 01/23/2019)   No  facility-administered encounter medications on file as of 05/13/2019.     Allergies  Allergen Reactions  . Penicillins Rash and Other (See Comments)    Has patient had a PCN reaction causing immediate rash, facial/tongue/throat swelling, SOB or lightheadedness with hypotension: Yes Has patient had a PCN reaction causing severe rash involving mucus membranes or skin necrosis: No Has patient had a PCN reaction that required hospitalization: No Has patient had a PCN reaction occurring within the last 10 years: No If all of the above answers are "NO", then may proceed with Cephalosporin use. Has patient had a PCN reaction causing immediate rash, facial/tongue/throat swelling, SOB or lightheadedness with hypotension: Yes Has patient had a PCN reaction causing severe rash involving mucus membranes or skin necrosis: No Has patient had a PCN reaction that required hospitalization: No Has patient had a PCN reaction occurring within the last 10 years: No If all of the above answers are "NO", then may proceed with Cephalosporin use. unknown   . Chantix [Varenicline] Other (See Comments)    Severe headache    Review of Systems  Constitutional: Negative for activity change, appetite change, chills, diaphoresis, fatigue, fever, unexpected weight change and weight loss.  Respiratory: Negative for cough and shortness of breath.   Cardiovascular: Negative for chest pain, palpitations and leg swelling.  Gastrointestinal: Positive for abdominal pain. Negative for abdominal distention, anal bleeding, anorexia, blood in stool, constipation, diarrhea, flatus, hematochezia, melena, nausea, rectal pain and vomiting.  Genitourinary: Positive for dysuria, frequency, testicular pain and urgency. Negative for decreased urine volume, difficulty urinating, discharge, enuresis, flank pain, genital sores, hematuria, penile pain, penile swelling and scrotal swelling.  Musculoskeletal: Negative for arthralgias and  myalgias.  Skin: Negative for color change and pallor.  Neurological: Negative for dizziness, weakness, light-headedness and headaches.  Psychiatric/Behavioral: Negative for confusion.  All other systems reviewed and are negative.        Observations/Objective: No vital signs or physical exam, this was a telephone or virtual health encounter.  Pt alert and oriented, answers all questions appropriately, and able to speak in full sentences.    Assessment and Plan: Roy [name] was seen today for abdominal pain and urinary urgency.  Diagnoses and all orders for this visit:  Dysuria Scrotal pain Urinary frequency Intermittent lower abdominal  pain Reported symptoms concerning for acute urinary tract infection or epididymitis. Due to high possibility of both, will treat with Levaquin for dual coverage. No red flags concerning for torsion or malignancy. Pt aware of symptomatic care. Pt aware if symptoms do not start to improve by day 3 of antibiotics to follow up in office. Pt aware of symptoms that warrant emergent evaluation and treatment in the ED. Follow up in 2 weeks for reevaluation.  -     levofloxacin (LEVAQUIN) 500 MG tablet; Take 1 tablet (500 mg total) by mouth daily for 10 days.     Follow Up Instructions: Return in about 2 weeks (around 05/27/2019), or if symptoms worsen or fail to improve, for reevaluation .    I discussed the assessment and treatment plan with the patient. The patient was provided an opportunity to ask questions and all were answered. The patient agreed with the plan and demonstrated an understanding of the instructions.   The patient was advised to call back or seek an in-person evaluation if the symptoms worsen or if the condition fails to improve as anticipated.  The above assessment and management plan was discussed with the patient. The patient verbalized understanding of and has agreed to the management plan. Patient is aware to call the clinic if  symptoms persist or worsen. Patient is aware when to return to the clinic for a follow-up visit. Patient educated on when it is appropriate to go to the emergency department.    I provided 25 minutes of non-face-to-face time during this encounter. The call started at 1500. The call ended at 1525. The other time was used for coordination of care.    Kari BaarsMichelle , FNP-C [facility] [address] [place], [place] [address] [phone] 05/13/19

## 2019-06-08 ENCOUNTER — Ambulatory Visit: Payer: Medicare HMO | Admitting: Family Medicine

## 2019-06-08 ENCOUNTER — Other Ambulatory Visit: Payer: Self-pay

## 2019-06-09 ENCOUNTER — Ambulatory Visit (INDEPENDENT_AMBULATORY_CARE_PROVIDER_SITE_OTHER): Payer: Medicare HMO | Admitting: Family Medicine

## 2019-06-09 ENCOUNTER — Encounter: Payer: Self-pay | Admitting: Family Medicine

## 2019-06-09 VITALS — BP 127/80 | HR 56 | Temp 97.8°F | Resp 16 | Ht 68.0 in | Wt 333.0 lb

## 2019-06-09 DIAGNOSIS — E038 Other specified hypothyroidism: Secondary | ICD-10-CM | POA: Diagnosis not present

## 2019-06-09 DIAGNOSIS — M5416 Radiculopathy, lumbar region: Secondary | ICD-10-CM

## 2019-06-09 DIAGNOSIS — K2100 Gastro-esophageal reflux disease with esophagitis, without bleeding: Secondary | ICD-10-CM

## 2019-06-09 DIAGNOSIS — Z6841 Body Mass Index (BMI) 40.0 and over, adult: Secondary | ICD-10-CM | POA: Diagnosis not present

## 2019-06-09 DIAGNOSIS — I10 Essential (primary) hypertension: Secondary | ICD-10-CM | POA: Diagnosis not present

## 2019-06-09 DIAGNOSIS — Z114 Encounter for screening for human immunodeficiency virus [HIV]: Secondary | ICD-10-CM | POA: Diagnosis not present

## 2019-06-09 DIAGNOSIS — J40 Bronchitis, not specified as acute or chronic: Secondary | ICD-10-CM

## 2019-06-09 DIAGNOSIS — E349 Endocrine disorder, unspecified: Secondary | ICD-10-CM

## 2019-06-09 MED ORDER — GEMFIBROZIL 600 MG PO TABS: 600.0000 mg | ORAL_TABLET | Freq: Two times a day (BID) | ORAL | 5 refills | Status: DC

## 2019-06-09 MED ORDER — LINACLOTIDE 290 MCG PO CAPS: 290.0000 ug | ORAL_CAPSULE | Freq: Every day | ORAL | 1 refills | Status: DC | PRN

## 2019-06-09 MED ORDER — DEXILANT 60 MG PO CPDR: 1.0000 | DELAYED_RELEASE_CAPSULE | Freq: Every day | ORAL | 1 refills | Status: DC

## 2019-06-09 MED ORDER — CHANTIX STARTING MONTH PAK 0.5 MG X 11 & 1 MG X 42 PO TABS: ORAL_TABLET | ORAL | 0 refills | Status: DC

## 2019-06-09 MED ORDER — TESTOSTERONE 20.25 MG/ACT (1.62%) TD GEL: TRANSDERMAL | 5 refills | Status: DC

## 2019-06-09 MED ORDER — GABAPENTIN 300 MG PO CAPS: 300.0000 mg | ORAL_CAPSULE | Freq: Three times a day (TID) | ORAL | 1 refills | Status: DC

## 2019-06-09 MED ORDER — SILDENAFIL CITRATE 20 MG PO TABS: ORAL_TABLET | ORAL | 1 refills | Status: DC

## 2019-06-09 MED ORDER — CARVEDILOL 6.25 MG PO TABS: 6.2500 mg | ORAL_TABLET | Freq: Two times a day (BID) | ORAL | 1 refills | Status: DC

## 2019-06-09 MED ORDER — ALPRAZOLAM 0.5 MG PO TABS: 0.5000 mg | ORAL_TABLET | Freq: Three times a day (TID) | ORAL | 5 refills | Status: DC | PRN

## 2019-06-09 MED ORDER — ALBUTEROL SULFATE HFA 108 (90 BASE) MCG/ACT IN AERS: 1.0000 | INHALATION_SPRAY | Freq: Four times a day (QID) | RESPIRATORY_TRACT | 2 refills | Status: DC | PRN

## 2019-06-09 MED ORDER — FAMOTIDINE 20 MG PO TABS: 20.0000 mg | ORAL_TABLET | Freq: Two times a day (BID) | ORAL | 0 refills | Status: DC

## 2019-06-09 MED ORDER — QUETIAPINE FUMARATE 50 MG PO TABS: 50.0000 mg | ORAL_TABLET | Freq: Every day | ORAL | 2 refills | Status: DC

## 2019-06-09 MED ORDER — DULOXETINE HCL 60 MG PO CPEP: 120.0000 mg | ORAL_CAPSULE | Freq: Every day | ORAL | 1 refills | Status: DC

## 2019-06-09 NOTE — Progress Notes (Signed)
Subjective:  Patient ID: Roy [name], male    DOB: 1971/01/10  Age: 48 y.o. MRN: [id number]  CC: Medical Management of Chronic Issues   HPI Roy [name] presents for  follow-up on  thyroid. The patient has a history of hypothyroidism for many years. It has been stable recently. Pt. denies any change in  voice, loss of hair, heat or cold intolerance. Energy level has been adequate to good. Patient denies constipation and diarrhea. No myxedema. Medication is as noted below.Pt reports he is taking his med with coffee. Pt. Reports marital conflict has caused an increase in anxiety - worry, loss of appetite, poor sleep   GAD 7 : Generalized Anxiety Score 06/09/2019 06/26/2016 04/04/2016 02/23/2016  Nervous, Anxious, on Edge 0 1 0 0  Control/stop worrying 0 0 2 2  Worry too much - different things 0 _0 Trouble relaxing 1 0 0 0  Restless 0 0 0 0  Easily annoyed or irritable 0 _1 Afraid - awful might happen 0 1 0 0  Total GAD 7 Score _2 Anxiety Difficulty Not difficult at all Not difficult at all Not difficult at all Not difficult at all      Depression screen [name] 2/9 06/09/2019 07/20/2018 07/01/2018  Decreased Interest 0 0 0  Down, Depressed, Hopeless 0 0 0  PHQ - 2 Score 0 0 0  Altered sleeping - - -  Tired, decreased energy - - -  Change in appetite - - -  Feeling bad or failure about yourself  - - -  Trouble concentrating - - -  Moving slowly or fidgety/restless - - -  Suicidal thoughts - - -  PHQ-9 Score - - -  Difficult doing work/chores - - -  Some recent data might be hidden    History Roy [name] has a past medical history of Anxiety, Depression, Hypertension, Low testosterone, Median nerve dysfunction, Radiculopathy of cervical region, Thyroid disease, Ulnar neuropathy at elbow, and Weakness.   He has a past surgical history that includes Rotator cuff repair (Right); Biceps tendon repair; and Neck surgery.   His family history includes Arrhythmia in his  father; Asthma in his brother and daughter; Bipolar disorder in his brother; COPD in his father; Fibromyalgia in his sister; Hypertension in his mother; Stroke (age of onset: 11) in his mother.He reports that he quit smoking about 22 months ago. His smoking use included cigarettes. He has a 15.00 pack-year smoking history. He has quit using smokeless tobacco.  His smokeless tobacco use included snuff and chew. He reports that he does not drink alcohol or use drugs.    ROS Review of Systems  Constitutional: Positive for fatigue.  HENT: Negative.   Eyes: Negative for visual disturbance.  Respiratory: Negative for cough and shortness of breath.   Cardiovascular: Negative for chest pain and leg swelling.  Gastrointestinal: Negative for abdominal pain, diarrhea, nausea and vomiting.  Genitourinary: Negative for difficulty urinating.  Musculoskeletal: Negative for arthralgias and myalgias.  Skin: Negative for rash.  Neurological: Negative for headaches.  Psychiatric/Behavioral: Positive for dysphoric mood. Negative for sleep disturbance. The patient is nervous/anxious.     Objective:  BP 127/80   Pulse (!) 56   Temp 97.8 F (36.6 C) (Oral)   Resp 16   Ht _3  (1.727 m)   Wt (!) 333 lb (151 kg)   SpO2 98%   BMI 50.63 kg/m   BP Readings from Last 3  Encounters:  06/09/19 127/80  01/23/19 (!) 160/87  07/20/18 129/78    Wt Readings from Last 3 Encounters:  06/09/19 (!) 333 lb (151 kg)  01/23/19 (!) 330 lb (149.7 kg)  07/20/18 (!) 346 lb (156.9 kg)     Physical Exam Constitutional:      General: He is not in acute distress.    Appearance: He is well-developed. He is obese.  HENT:     Head: Normocephalic and atraumatic.     Right Ear: External ear normal.     Left Ear: External ear normal.     Nose: Nose normal.  Eyes:     Conjunctiva/sclera: Conjunctivae normal.     Pupils: Pupils are equal, round, and reactive to light.  Neck:     Musculoskeletal: Normal range of motion  and neck supple.  Cardiovascular:     Rate and Rhythm: Normal rate and regular rhythm.     Heart sounds: Normal heart sounds. No murmur.  Pulmonary:     Effort: Pulmonary effort is normal. No respiratory distress.     Breath sounds: Normal breath sounds. No wheezing or rales.  Abdominal:     Palpations: Abdomen is soft.     Tenderness: There is no abdominal tenderness.  Musculoskeletal: Normal range of motion.  Skin:    General: Skin is warm and dry.  Neurological:     Mental Status: He is alert and oriented to person, place, and time.     Deep Tendon Reflexes: Reflexes are normal and symmetric.  Psychiatric:        Behavior: Behavior normal.        Thought Content: Thought content normal.        Judgment: Judgment normal.       Assessment & Plan:   Blayden was seen today for medical management of chronic issues.  Diagnoses and all orders for this visit:  Screening for HIV (human immunodeficiency virus) -     HIV Antibody (routine testing w rflx) -     CBC with Differential/Platelet -     CMP14+EGFR  Hypotestosteronism -     Testosterone (ANDROGEL PUMP) 20.25 MG/ACT (1.62%) GEL; Apply 4 pumps daily to upper chest and shoulders -     CBC with Differential/Platelet -     CMP14+EGFR -     Testosterone,Free and Total  Lumbar radiculopathy -     DULoxetine (CYMBALTA) 60 MG capsule; Take 2 capsules (120 mg total) by mouth daily. -     CBC with Differential/Platelet -     CMP14+EGFR  Bronchitis -     albuterol (VENTOLIN HFA) 108 (90 Base) MCG/ACT inhaler; Inhale 1-2 puffs into the lungs every 6 (six) hours as needed for wheezing or shortness of breath. -     CBC with Differential/Platelet -     CMP14+EGFR  GERD with esophagitis -     famotidine (PEPCID) 20 MG tablet; Take 1 tablet (20 mg total) by mouth 2 (two) times daily for 14 days. -     CBC with Differential/Platelet -     CMP14+EGFR  Essential hypertension -     CBC with Differential/Platelet -     CMP14+EGFR   Other specified hypothyroidism -     CBC with Differential/Platelet -     CMP14+EGFR -     Lipid panel -     TSH + free T4 -     Testosterone,Free and Total  Other orders -     sildenafil (REVATIO) 20 MG tablet;  Take 2-5 tablets daily as needed for sex -     ALPRAZolam (XANAX) 0.5 MG tablet; Take 1 tablet (0.5 mg total) by mouth 3 (three) times daily as needed for anxiety. -     dexlansoprazole (DEXILANT) 60 MG capsule; Take 1 capsule (60 mg total) by mouth daily. -     linaclotide (LINZESS) 290 MCG CAPS capsule; Take 1 capsule (290 mcg total) by mouth daily as needed (constipation). -     gabapentin (NEURONTIN) 300 MG capsule; Take 1 capsule (300 mg total) by mouth 3 (three) times daily. -     carvedilol (COREG) 6.25 MG tablet; Take 1 tablet (6.25 mg total) by mouth 2 (two) times daily with a meal. -     gemfibrozil (LOPID) 600 MG tablet; Take 1 tablet (600 mg total) by mouth 2 (two) times daily. -     QUEtiapine (SEROQUEL) 50 MG tablet; Take 1 tablet (50 mg total) by mouth at bedtime. For nerves -     varenicline (CHANTIX STARTING MONTH PAK) 0.5 MG X 11 & 1 MG X 42 tablet; Use according to package directions       I have discontinued Roy [name]. Roy [name] polyethylene glycol powder, varenicline, and Flaxseed Oil. I have also changed his DULoxetine, albuterol, and linaclotide. Additionally, I am having him start on QUEtiapine and Chantix Starting Month Pak. Lastly, I am having him maintain his Fish Oil, acetaminophen, traZODone, celecoxib, budesonide-formoterol, levothyroxine, predniSONE, Testosterone, sildenafil, ALPRAZolam, Dexilant, famotidine, gabapentin, carvedilol, and gemfibrozil.  Allergies as of 06/09/2019      Reactions   Penicillins Rash, Other (See Comments)   Has patient had a PCN reaction causing immediate rash, facial/tongue/throat swelling, SOB or lightheadedness with hypotension: Yes Has patient had a PCN reaction causing severe rash involving mucus membranes or skin  necrosis: No Has patient had a PCN reaction that required hospitalization: No Has patient had a PCN reaction occurring within the last 10 years: No If all of the above answers are "NO", then may proceed with Cephalosporin use. Has patient had a PCN reaction causing immediate rash, facial/tongue/throat swelling, SOB or lightheadedness with hypotension: Yes Has patient had a PCN reaction causing severe rash involving mucus membranes or skin necrosis: No Has patient had a PCN reaction that required hospitalization: No Has patient had a PCN reaction occurring within the last 10 years: No If all of the above answers are "NO", then may proceed with Cephalosporin use. unknown   Chantix [varenicline] Other (See Comments)   Severe headache      Medication List       Accurate as of June 09, 2019 11:59 PM. If you have any questions, ask your nurse or doctor.        STOP taking these medications   Flaxseed Oil 1000 MG Caps Stopped by: Claretta Fraise, MD   polyethylene glycol powder 17 GM/SCOOP powder Commonly known as: GLYCOLAX/MIRALAX Stopped by: Claretta Fraise, MD   varenicline 1 MG tablet Commonly known as: Chantix Continuing Month Pak Replaced by: Chantix Starting Month Pak 0.5 MG X 11 & 1 MG X 42 tablet Stopped by: Claretta Fraise, MD     TAKE these medications   acetaminophen 500 MG tablet Commonly known as: TYLENOL Take 1,000 mg by mouth every 6 (six) hours as needed for headache (pain).   albuterol 108 (90 Base) MCG/ACT inhaler Commonly known as: VENTOLIN HFA Inhale 1-2 puffs into the lungs every 6 (six) hours as needed for wheezing or shortness of breath.   ALPRAZolam  0.5 MG tablet Commonly known as: XANAX Take 1 tablet (0.5 mg total) by mouth 3 (three) times daily as needed for anxiety.   budesonide-formoterol 160-4.5 MCG/ACT inhaler Commonly known as: Symbicort INHALE TWO PUFFS BY MOUTH TWICE DAILY FOR COPD/ASTHMA What changed:   how much to take  how to take  this  when to take this  additional instructions   carvedilol 6.25 MG tablet Commonly known as: COREG Take 1 tablet (6.25 mg total) by mouth 2 (two) times daily with a meal.   celecoxib 200 MG capsule Commonly known as: CeleBREX Take 1 capsule (200 mg total) by mouth daily. For arthritis   Chantix Starting Month Pak 0.5 MG X 11 & 1 MG X 42 tablet Generic drug: varenicline Use according to package directions Replaces: varenicline 1 MG tablet Started by: Claretta Fraise, MD   Dexilant 60 MG capsule Generic drug: dexlansoprazole Take 1 capsule (60 mg total) by mouth daily.   DULoxetine 60 MG capsule Commonly known as: CYMBALTA Take 2 capsules (120 mg total) by mouth daily.   famotidine 20 MG tablet Commonly known as: Pepcid Take 1 tablet (20 mg total) by mouth 2 (two) times daily for 14 days.   Fish Oil 1000 MG Caps Take 1,000 mg by mouth daily.   gabapentin 300 MG capsule Commonly known as: NEURONTIN Take 1 capsule (300 mg total) by mouth 3 (three) times daily.   gemfibrozil 600 MG tablet Commonly known as: LOPID Take 1 tablet (600 mg total) by mouth 2 (two) times daily.   levothyroxine 50 MCG tablet Commonly known as: SYNTHROID Take 1 tablet (50 mcg total) by mouth daily.   linaclotide 290 MCG Caps capsule Commonly known as: LINZESS Take 1 capsule (290 mcg total) by mouth daily as needed (constipation).   predniSONE 20 MG tablet Commonly known as: DELTASONE Take two daily from 5/7 thru 5/9. Then one a day 5/10 -5/12   QUEtiapine 50 MG tablet Commonly known as: SEROQUEL Take 1 tablet (50 mg total) by mouth at bedtime. For nerves Started by: Claretta Fraise, MD   sildenafil 20 MG tablet Commonly known as: REVATIO Take 2-5 tablets daily as needed for sex   Testosterone 20.25 MG/ACT (1.62%) Gel Commonly known as: AndroGel Pump Apply 4 pumps daily to upper chest and shoulders   traZODone 100 MG tablet Commonly known as: DESYREL Take 1 tablet (100 mg total)  by mouth at bedtime.        Follow-up: No follow-ups on file.  Claretta Fraise, M.D.

## 2019-06-10 ENCOUNTER — Ambulatory Visit: Payer: Medicare HMO | Admitting: *Deleted

## 2019-06-10 DIAGNOSIS — F411 Generalized anxiety disorder: Secondary | ICD-10-CM

## 2019-06-10 DIAGNOSIS — K2100 Gastro-esophageal reflux disease with esophagitis, without bleeding: Secondary | ICD-10-CM

## 2019-06-10 NOTE — Chronic Care Management (AMB) (Signed)
  Chronic Care Management   Outreach Note  06/10/2019 Name: Roy [name] MRN: [id number] DOB: 1971-05-08  Referred by: Claretta Fraise, MD Reason for referral : Chronic Care Management (RN CCM Initial Outreach (2nd attempt))   A second unsuccessful telephone outreach for his Initial CCM Visit was attempted today. The patient was referred to the case management team for assistance with chronic care management and care coordination. He had an office visit and labs with Dr Livia Snellen yesterday.    Follow Up Plan: The care management team will reach out to the patient again over the next 14 days.    Chong Sicilian, BSN, RN-BC Embedded Chronic Care Manager [facility] Direct Dial: [phone]

## 2019-06-12 ENCOUNTER — Encounter: Payer: Self-pay | Admitting: Family Medicine

## 2019-06-13 LAB — CMP14+EGFR
ALT: 13 IU/L (ref 0–44)
AST: 12 IU/L (ref 0–40)
Albumin/Globulin Ratio: 2.1 (ref 1.2–2.2)
Albumin: 4.7 g/dL (ref 4.0–5.0)
Alkaline Phosphatase: 70 IU/L (ref 39–117)
BUN/Creatinine Ratio: 15 (ref 9–20)
BUN: 11 mg/dL (ref 6–24)
Bilirubin Total: 0.3 mg/dL (ref 0.0–1.2)
CO2: 22 mmol/L (ref 20–29)
Calcium: 9.6 mg/dL (ref 8.7–10.2)
Chloride: 99 mmol/L (ref 96–106)
Creatinine, Ser: 0.74 mg/dL — ABNORMAL LOW (ref 0.76–1.27)
GFR calc Af Amer: 128 mL/min/{1.73_m2} (ref >59)
GFR calc non Af Amer: 111 mL/min/{1.73_m2} (ref >59)
Globulin, Total: 2.2 g/dL (ref 1.5–4.5)
Glucose: 91 mg/dL (ref 65–99)
Potassium: 4.6 mmol/L (ref 3.5–5.2)
Sodium: 138 mmol/L (ref 134–144)
Total Protein: 6.9 g/dL (ref 6.0–8.5)

## 2019-06-13 LAB — LIPID PANEL
Chol/HDL Ratio: 3.9 ratio (ref 0.0–5.0)
Cholesterol, Total: 152 mg/dL (ref 100–199)
HDL: 39 mg/dL — ABNORMAL LOW (ref >39)
LDL Chol Calc (NIH): 88 mg/dL (ref 0–99)
Triglycerides: 141 mg/dL (ref 0–149)
VLDL Cholesterol Cal: 25 mg/dL (ref 5–40)

## 2019-06-13 LAB — HIV ANTIBODY (ROUTINE TESTING W REFLEX): HIV Screen 4th Generation wRfx: NONREACTIVE

## 2019-06-13 LAB — CBC WITH DIFFERENTIAL/PLATELET
Basophils Absolute: 0.1 10*3/uL (ref 0.0–0.2)
Basos: 1 %
EOS (ABSOLUTE): 0.1 10*3/uL (ref 0.0–0.4)
Eos: 2 %
Hematocrit: 39 % (ref 37.5–51.0)
Hemoglobin: 14.1 g/dL (ref 13.0–17.7)
Immature Grans (Abs): 0 10*3/uL (ref 0.0–0.1)
Immature Granulocytes: 0 %
Lymphocytes Absolute: 2 10*3/uL (ref 0.7–3.1)
Lymphs: 33 %
MCH: 31.1 pg (ref 26.6–33.0)
MCHC: 36.2 g/dL — ABNORMAL HIGH (ref 31.5–35.7)
MCV: 86 fL (ref 79–97)
Monocytes Absolute: 0.5 10*3/uL (ref 0.1–0.9)
Monocytes: 8 %
Neutrophils Absolute: 3.3 10*3/uL (ref 1.4–7.0)
Neutrophils: 56 %
Platelets: 257 10*3/uL (ref 150–450)
RBC: 4.53 x10E6/uL (ref 4.14–5.80)
RDW: 12.8 % (ref 11.6–15.4)
WBC: 6 10*3/uL (ref 3.4–10.8)

## 2019-06-13 LAB — TESTOSTERONE,FREE AND TOTAL
Testosterone, Free: 5.2 pg/mL — ABNORMAL LOW (ref 6.8–21.5)
Testosterone: 262 ng/dL — ABNORMAL LOW (ref 264–916)

## 2019-06-13 LAB — TSH+FREE T4
Free T4: 0.96 ng/dL (ref 0.82–1.77)
TSH: 1.73 u[IU]/mL (ref 0.450–4.500)

## 2019-06-21 ENCOUNTER — Encounter: Payer: Self-pay | Admitting: *Deleted

## 2019-07-01 ENCOUNTER — Telehealth: Payer: Self-pay | Admitting: Family Medicine

## 2019-07-01 NOTE — Telephone Encounter (Signed)
What is the name of the medication? varenicline (CHANTIX STARTING MONTH PAK) 0.5 MG X 11 & 1 MG X 42 tablet  Have you contacted your pharmacy to request a refill? No refills  Which pharmacy would you like this sent to? [facility]   Patient notified that their request is being sent to the clinical staff for review and that they should receive a call once it is complete. If they do not receive a call within 24 hours they can check with their pharmacy or our office.

## 2019-07-02 ENCOUNTER — Other Ambulatory Visit: Payer: Self-pay | Admitting: Family Medicine

## 2019-07-02 MED ORDER — CHANTIX STARTING MONTH PAK 0.5 MG X 11 & 1 MG X 42 PO TABS: ORAL_TABLET | ORAL | 0 refills | Status: DC

## 2019-07-02 NOTE — Telephone Encounter (Signed)
I sent in the requested prescription 

## 2019-07-02 NOTE — Telephone Encounter (Signed)
Left message to notify that RX has been sent in. MPulliam, CMA/RT(R)

## 2019-07-07 ENCOUNTER — Telehealth: Payer: Self-pay | Admitting: Family Medicine

## 2019-07-07 ENCOUNTER — Other Ambulatory Visit: Payer: Self-pay | Admitting: Family Medicine

## 2019-07-07 MED ORDER — VARENICLINE TARTRATE 1 MG PO TABS: 1.0000 mg | ORAL_TABLET | Freq: Two times a day (BID) | ORAL | 5 refills | Status: DC

## 2019-07-07 NOTE — Telephone Encounter (Signed)
I sent in the requested prescription 

## 2019-07-08 NOTE — Telephone Encounter (Signed)
Called unable to reach the patient.

## 2019-09-01 ENCOUNTER — Other Ambulatory Visit: Payer: Self-pay | Admitting: *Deleted

## 2019-09-01 DIAGNOSIS — E039 Hypothyroidism, unspecified: Secondary | ICD-10-CM

## 2019-09-01 MED ORDER — LEVOTHYROXINE SODIUM 50 MCG PO TABS: 50.0000 ug | ORAL_TABLET | Freq: Every day | ORAL | 2 refills | Status: DC

## 2019-09-16 ENCOUNTER — Other Ambulatory Visit: Payer: Self-pay | Admitting: Family Medicine

## 2019-09-22 ENCOUNTER — Emergency Department (HOSPITAL_COMMUNITY): Payer: Medicare HMO

## 2019-09-22 ENCOUNTER — Encounter (HOSPITAL_COMMUNITY): Payer: Self-pay | Admitting: Emergency Medicine

## 2019-09-22 ENCOUNTER — Emergency Department (HOSPITAL_COMMUNITY)
Admission: EM | Admit: 2019-09-22 | Discharge: 2019-09-23 | Disposition: A | Discharge status: Home / Self Care | Payer: Medicare HMO | Attending: Emergency Medicine | Admitting: Emergency Medicine

## 2019-09-22 ENCOUNTER — Other Ambulatory Visit: Payer: Self-pay

## 2019-09-22 DIAGNOSIS — Z79899 Other long term (current) drug therapy: Secondary | ICD-10-CM | POA: Diagnosis not present

## 2019-09-22 DIAGNOSIS — R0789 Other chest pain: Secondary | ICD-10-CM | POA: Diagnosis not present

## 2019-09-22 DIAGNOSIS — E039 Hypothyroidism, unspecified: Secondary | ICD-10-CM | POA: Insufficient documentation

## 2019-09-22 DIAGNOSIS — R519 Headache, unspecified: Secondary | ICD-10-CM | POA: Diagnosis not present

## 2019-09-22 DIAGNOSIS — R410 Disorientation, unspecified: Secondary | ICD-10-CM | POA: Diagnosis not present

## 2019-09-22 DIAGNOSIS — G930 Cerebral cysts: Secondary | ICD-10-CM | POA: Diagnosis not present

## 2019-09-22 DIAGNOSIS — Z20828 Contact with and (suspected) exposure to other viral communicable diseases: Secondary | ICD-10-CM | POA: Diagnosis not present

## 2019-09-22 DIAGNOSIS — Z87891 Personal history of nicotine dependence: Secondary | ICD-10-CM | POA: Diagnosis not present

## 2019-09-22 DIAGNOSIS — R079 Chest pain, unspecified: Secondary | ICD-10-CM | POA: Diagnosis not present

## 2019-09-22 LAB — BASIC METABOLIC PANEL
Anion gap: 53 — ABNORMAL HIGH (ref 5–15)
BUN: 9 mg/dL (ref 6–20)
CO2: 18 mmol/L — ABNORMAL LOW (ref 22–32)
Calcium: 5.9 mg/dL — CL (ref 8.9–10.3)
Chloride: 83 mmol/L — ABNORMAL LOW (ref 98–111)
Creatinine, Ser: 0.8 mg/dL (ref 0.61–1.24)
GFR calc Af Amer: >60 mL/min (ref >60)
GFR calc non Af Amer: >60 mL/min (ref >60)
Glucose, Bld: 75 mg/dL (ref 70–99)
Potassium: 4.4 mmol/L (ref 3.5–5.1)
Sodium: 154 mmol/L — ABNORMAL HIGH (ref 135–145)

## 2019-09-22 LAB — COMPREHENSIVE METABOLIC PANEL
ALT: 22 U/L (ref 0–44)
AST: 20 U/L (ref 15–41)
Albumin: 4.3 g/dL (ref 3.5–5.0)
Alkaline Phosphatase: 61 U/L (ref 38–126)
Anion gap: 12 (ref 5–15)
BUN: 10 mg/dL (ref 6–20)
CO2: 26 mmol/L (ref 22–32)
Calcium: 9.4 mg/dL (ref 8.9–10.3)
Chloride: 96 mmol/L — ABNORMAL LOW (ref 98–111)
Creatinine, Ser: 0.83 mg/dL (ref 0.61–1.24)
GFR calc Af Amer: >60 mL/min (ref >60)
GFR calc non Af Amer: >60 mL/min (ref >60)
Glucose, Bld: 86 mg/dL (ref 70–99)
Potassium: 3.8 mmol/L (ref 3.5–5.1)
Sodium: 134 mmol/L — ABNORMAL LOW (ref 135–145)
Total Bilirubin: 0.7 mg/dL (ref 0.3–1.2)
Total Protein: 7.6 g/dL (ref 6.5–8.1)

## 2019-09-22 LAB — T4, FREE: Free T4: 0.76 ng/dL (ref 0.61–1.12)

## 2019-09-22 LAB — CBC
HCT: 40.5 % (ref 39.0–52.0)
Hemoglobin: 12.9 g/dL — ABNORMAL LOW (ref 13.0–17.0)
MCH: 30.9 pg (ref 26.0–34.0)
MCHC: 31.9 g/dL (ref 30.0–36.0)
MCV: 97.1 fL (ref 80.0–100.0)
Platelets: 182 10*3/uL (ref 150–400)
RBC: 4.17 MIL/uL — ABNORMAL LOW (ref 4.22–5.81)
RDW: 13.4 % (ref 11.5–15.5)
WBC: 4.3 10*3/uL (ref 4.0–10.5)
nRBC: 0 % (ref 0.0–0.2)

## 2019-09-22 LAB — TROPONIN I (HIGH SENSITIVITY)
Troponin I (High Sensitivity): <2 ng/L (ref <18)
Troponin I (High Sensitivity): 3 ng/L (ref <18)
Troponin I (High Sensitivity): 2 ng/L

## 2019-09-22 LAB — SEDIMENTATION RATE: Sed Rate: 35 mm/hr — ABNORMAL HIGH (ref 0–16)

## 2019-09-22 LAB — PHOSPHORUS: Phosphorus: 4.3 mg/dL (ref 2.5–4.6)

## 2019-09-22 LAB — SARS CORONAVIRUS 2 (TAT 6-24 HRS): SARS Coronavirus 2: NEGATIVE

## 2019-09-22 LAB — MAGNESIUM: Magnesium: 2 mg/dL (ref 1.7–2.4)

## 2019-09-22 LAB — TSH: TSH: 1.348 u[IU]/mL (ref 0.350–4.500)

## 2019-09-22 LAB — C-REACTIVE PROTEIN: CRP: 1 mg/dL — ABNORMAL HIGH (ref <1.0)

## 2019-09-22 MED ORDER — CALCIUM GLUCONATE-NACL 1-0.675 GM/50ML-% IV SOLN
1.0000 g | Freq: Once | INTRAVENOUS | Status: AC
  Administered 2019-09-22: 1000 mg via INTRAVENOUS
  Filled 2019-09-22: qty 50

## 2019-09-22 MED ORDER — SODIUM CHLORIDE 0.9 % IV BOLUS: 500.0000 mL | Freq: Once | INTRAVENOUS | Status: DC

## 2019-09-22 MED ORDER — SODIUM CHLORIDE 0.45 % IV SOLN: INTRAVENOUS | Status: DC

## 2019-09-22 MED ORDER — LORAZEPAM 1 MG PO TABS: 0.0000 mg | ORAL_TABLET | Freq: Four times a day (QID) | ORAL | Status: DC

## 2019-09-22 MED ORDER — CARVEDILOL 6.25 MG PO TABS
6.2500 mg | ORAL_TABLET | Freq: Once | ORAL | Status: AC
  Administered 2019-09-22: 6.25 mg via ORAL
  Filled 2019-09-22: qty 1

## 2019-09-22 MED ORDER — LORAZEPAM 1 MG PO TABS: 0.0000 mg | ORAL_TABLET | Freq: Two times a day (BID) | ORAL | Status: DC

## 2019-09-22 MED ORDER — LORAZEPAM 2 MG/ML IJ SOLN
0.0000 mg | Freq: Four times a day (QID) | INTRAMUSCULAR | Status: DC
  Administered 2019-09-23: 2 mg via INTRAVENOUS
  Filled 2019-09-22: qty 2

## 2019-09-22 MED ORDER — LORAZEPAM 2 MG/ML IJ SOLN
1.0000 mg | Freq: Once | INTRAMUSCULAR | Status: AC
  Administered 2019-09-22: 1 mg via INTRAVENOUS
  Filled 2019-09-22: qty 1

## 2019-09-22 MED ORDER — SODIUM CHLORIDE 0.9 % IV SOLN: 1.0000 g | Freq: Once | INTRAVENOUS | Status: DC

## 2019-09-22 MED ORDER — LORAZEPAM 2 MG/ML IJ SOLN: 0.0000 mg | Freq: Two times a day (BID) | INTRAMUSCULAR | Status: DC

## 2019-09-22 NOTE — ED Notes (Signed)
Spoke with [facility] EMS dispatch, patient will be transferred to [facility]. Will be a delay due to available EMS trucks.

## 2019-09-22 NOTE — Progress Notes (Signed)
VAST consulted to place IV access. Upon review of chart, noted IV access obtained by ER staff. Securechat to pt's nurse to confirm pt does not need further access. Ngozi, RN stated no further access needed at this time.

## 2019-09-22 NOTE — ED Provider Notes (Addendum)
Medical screening examination/treatment/procedure(s) were conducted as a shared visit with non-physician practitioner(s) and myself.  I personally evaluated the patient during the encounter.  EKG Interpretation  Date/Time:  Wednesday September 22 2019 12:31:29 EST Ventricular Rate:  53 PR Interval:    QRS Duration: 92 QT Interval:  430 QTC Calculation: 404 R Axis:   -48 Text Interpretation: Sinus rhythm Left anterior fascicular block no sig change from previous Confirmed by Charlesetta Shanks [id number]) on 09/22/2019 2:52:11 PM Patient reports he had an episode yesterday where he just kind of got really foggy and forgot a small period of time.  He states he was looking at his own phone for his name, obviously not recognizing that his own name is not in his contact list.  He reports this 46 year old daughter was trying to explain to him and he just was not getting it.  He reports he walked toward the bathroom and/or the closet and things seemed "foggy".  He reports he does not have a really clear recollection of that short period of time.  This did resolve and he is no longer dealing with confusion.  He reports he has had a headache that was first at the left temple and now on the right temple.  He reports he does get headaches relatively frequently.  He denies he had any loss of vision or double vision.  He did not have focal weakness numbness or tingling of the extremity.  He reports yesterday he also had an episode of some type of chest discomfort that was first on his left upper chest and then on his right upper chest.  After that it just felt kind of vaguely achy like maybe he did flex his muscles too hard.  He reports he has mostly resolved now but is still noticeable if he lies back. No history of head injury.  He has old scars on the forehead from a very distant soft tissue injury.  Patient reports that he had stopped taking Xanax cold [place] approximately 5 days ago.  He had his own prescription for  Xanax but was augmenting his doses by taking his wife's.  They have split up and now he has not had access to the additional dosing and ran out.  Patient denies he drinks alcohol.  He does smoke cigarettes.  He denies other drugs of abuse.  Patient is alert with clear mental status.  His speech is clear.  He does not seem delayed in cognitive function.  Old scars on the forehead.  Otherwise normocephalic atraumatic.  Pupils are 3 mm responsive with normal extraocular motions without nystagmus.  His membranes are pink and moist.  No evident thyromegaly.  Heart regular.  Lungs clear.  Patient does not have any active tremor.  Basic chemistry shows severe hyper natremia and hypocalcemia.  EKG unremarkable.  Unclear etiology for hyponatremia and hypocalcemia.  Patient reports he is compliant with his thyroid medications.  Will redraw chemistry to confirm values.  Patient is certainly at risk for benzodiazepine withdrawal.  We will proceed with CT head, start with fluid resuscitation with half-normal saline and an amp of calcium gluconate while awaiting additional work-up and plan for admission.  Patient is stable at this time.  He does not have hypotension, hypoxia or mental status change.  Will add CIWA scale.  Repeat basic chemistry panel shows normal sodium and calcium.  First draw consistent with lab error.  Patient's review of systems in general condition does not support severe hypernatremia or hypocalcemia.  Will complete work-up for headache and mental status change described by the patient.  If within normal limits, anticipate patient will be stable for discharge.     Arby Barrette, MD 09/22/19 1510    Arby Barrette, MD 09/22/19 [phone]

## 2019-09-22 NOTE — ED Triage Notes (Signed)
Patient reports headache and feeling "in a fog" yesterday. States intermittent sharp left chest pain starting yesterday. Denies N/V/D.

## 2019-09-22 NOTE — ED Notes (Signed)
Patient is unable to fit in the MRI machine, notified [place], [place].

## 2019-09-22 NOTE — ED Notes (Signed)
[facility] has arrived to transport patient to [facility] ED.

## 2019-09-22 NOTE — ED Provider Notes (Signed)
[facility] DEPT Provider Note   CSN: [id number] Arrival date & time: 09/22/19  1219     History Chief Complaint  Patient presents with  . Headache  . Chest Pain    Roy [name] is a 48 y.o. male history of morbid obesity, hypothyroidism, hypertension, GERD, brain cyst.  Patient reports yesterday around 1 PM he was sitting in his bedroom on his bed and speaking with his daughter.  He reports that he developed a left temporal headache at that time moderate nonradiating throbbing sensation constant no aggravating or alleviating factors.  He reports at the same time as his headache he felt confused he was attempting to find his own name on his cell phone and was not able to do so, he reports that at that time he felt like he was "in a fog".  This feeling lasted for around 1 minute before self resolving.  He reports his headache gradually improved over the next few minutes.  Approximately half an hour later patient developed chest pain a left-sided throbbing sensation radiating to both shoulders mild intensity constant without aggravating or alleviating factors, this also resolved yesterday.  Patient reports that he was feeling well but earlier today prior to arriving to the ER he developed a right-sided throbbing temporal headache mild nonradiating and without confusion.  He denies any fever/chills, fall/injury, vision changes, eye pain, neck stiffness, active chest pain, shortness of breath, cough/hemoptysis, abdominal pain, nausea/vomiting, diarrhea, diaphoresis, extremity swelling/color change, numbness/tingling, weakness or any additional concerns.  Of note patient reports that he has baseline weakness of his left extremities due to previous brain cyst removal.  He reports this is unchanged.  Additionally patient reports recent medication change he reports that 2 weeks ago he stopped taking Xanax "cold [place]".  HPI     Past Medical History:    Diagnosis Date  . Anxiety   . Depression   . Hypertension   . Low testosterone   . Median nerve dysfunction   . Radiculopathy of cervical region   . Thyroid disease   . Ulnar neuropathy at elbow   . Weakness     Patient Active Problem List   Diagnosis Date Noted  . Asthma 01/22/2019  . GAD (generalized anxiety disorder) 01/06/2019  . GERD (gastroesophageal reflux disease) 12/23/2017  . Headache 01/22/2016  . Nicotine abuse 12/21/2015  . Benign essential HTN 12/21/2015  . Hypothyroid 12/21/2015  . Morbid obesity ([place]) 10/19/2015  . Nontraumatic tear of right rotator cuff 06/04/2014  . Cervical radiculopathy 08/11/2013  . Lumbar radiculopathy 08/11/2013  . 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 Hypotestosteronism 03/29/2013    Past Surgical History:  Procedure Laterality Date  . BICEPS TENDON REPAIR    . NECK SURGERY     Fusion done two seperate times  . ROTATOR CUFF REPAIR Right        Family History  Problem Relation Age of Onset  . Hypertension Mother   . Stroke Mother 39  . Arrhythmia Father   . COPD Father   . Asthma Brother   . Bipolar disorder Brother   . Asthma Daughter   . Fibromyalgia Sister     Social History   Tobacco Use  . Smoking status: Former Smoker    Packs/day: 0.50    Years: 30.00    Pack years: 15.00    Types: Cigarettes    Quit date: 07/23/2017    Years since quitting: 2.1  . Smokeless tobacco: Former Systems developer    Types:  Snuff, Chew  Substance Use Topics  . Alcohol use: No  . Drug use: No    Home Medications Prior to Admission medications   Medication Sig Start Date End Date Taking? Authorizing Provider  acetaminophen (TYLENOL) 500 MG tablet Take 1,000 mg by mouth every 6 (six) hours as needed for headache (pain).     [provider]  albuterol (VENTOLIN HFA) 108 (90 Base) MCG/ACT inhaler Inhale 1-2 puffs into the lungs every 6 (six) hours as needed for wheezing or shortness of breath. 06/09/19   Claretta Fraise, MD  ALPRAZolam  Duanne Moron) 0.5 MG tablet Take 1 tablet (0.5 mg total) by mouth 3 (three) times daily as needed for anxiety. 06/09/19   Claretta Fraise, MD  budesonide-formoterol (SYMBICORT) 160-4.5 MCG/ACT inhaler INHALE TWO PUFFS BY MOUTH TWICE DAILY FOR COPD/ASTHMA Patient taking differently: Inhale 2 puffs into the lungs 2 (two) times a day.  01/06/19   Claretta Fraise, MD  carvedilol (COREG) 6.25 MG tablet Take 1 tablet (6.25 mg total) by mouth 2 (two) times daily with a meal. 06/09/19   Claretta Fraise, MD  celecoxib (CELEBREX) 200 MG capsule Take 1 capsule (200 mg total) by mouth daily. For arthritis 01/06/19   Claretta Fraise, MD  dexlansoprazole (DEXILANT) 60 MG capsule Take 1 capsule (60 mg total) by mouth daily. 06/09/19   Claretta Fraise, MD  DULoxetine (CYMBALTA) 60 MG capsule Take 2 capsules (120 mg total) by mouth daily. 06/09/19   Claretta Fraise, MD  famotidine (PEPCID) 20 MG tablet Take 1 tablet (20 mg total) by mouth 2 (two) times daily for 14 days. 06/09/19 06/23/19  Claretta Fraise, MD  gabapentin (NEURONTIN) 300 MG capsule Take 1 capsule (300 mg total) by mouth 3 (three) times daily. 06/09/19   Claretta Fraise, MD  gemfibrozil (LOPID) 600 MG tablet Take 1 tablet (600 mg total) by mouth 2 (two) times daily. 06/09/19   Claretta Fraise, MD  levothyroxine (SYNTHROID) 50 MCG tablet Take 1 tablet (50 mcg total) by mouth daily. 09/01/19   Claretta Fraise, MD  linaclotide Rolan Lipa) 290 MCG CAPS capsule Take 1 capsule (290 mcg total) by mouth daily as needed (constipation). 06/09/19   Claretta Fraise, MD  Omega-3 Fatty Acids (FISH OIL) 1000 MG CAPS Take 1,000 mg by mouth daily.     [provider]  predniSONE (DELTASONE) 20 MG tablet Take two daily from 5/7 thru 5/9. Then one a day 5/10 -5/12 01/26/19   Claretta Fraise, MD  QUEtiapine (SEROQUEL) 50 MG tablet TAKE 1 TABLET BY MOUTH AT BEDTIME FOR NERVES 09/20/19   Baruch Gouty, FNP  sildenafil (REVATIO) 20 MG tablet Take 2-5 tablets daily as needed for sex 06/09/19   Claretta Fraise, MD  Testosterone (ANDROGEL PUMP) 20.25 MG/ACT (1.62%) GEL Apply 4 pumps daily to upper chest and shoulders 06/09/19   Claretta Fraise, MD  traZODone (DESYREL) 100 MG tablet Take 1 tablet (100 mg total) by mouth at bedtime. 01/06/19   Claretta Fraise, MD  varenicline (CHANTIX CONTINUING MONTH PAK) 1 MG tablet Take 1 tablet (1 mg total) by mouth 2 (two) times daily. 07/07/19   Claretta Fraise, MD    Allergies    Penicillins and Chantix [varenicline]  Review of Systems   Review of Systems Ten systems are reviewed and are negative for acute change except as noted in the HPI  Physical Exam Updated Vital Signs BP (!) 153/97 (BP Location: Left Arm)   Pulse (!) 49   Temp 97.7 F (36.5 C) (Oral)  Resp 14   SpO2 100%   Physical Exam Constitutional:      General: He is not in acute distress.    Appearance: Normal appearance. He is well-developed. He is not ill-appearing or diaphoretic.  HENT:     Head: Normocephalic and atraumatic.     Right Ear: External ear normal.     Left Ear: External ear normal.     Nose: Nose normal.  Eyes:     General: Vision grossly intact. Gaze aligned appropriately.     Extraocular Movements: Extraocular movements intact.     Pupils: Pupils are equal, round, and reactive to light.  Neck:     Trachea: Trachea and phonation normal. No tracheal deviation.     Meningeal: Brudzinski's sign absent.  Cardiovascular:     Rate and Rhythm: Normal rate and regular rhythm.     Heart sounds: Normal heart sounds.  Pulmonary:     Effort: Pulmonary effort is normal. No respiratory distress.     Breath sounds: Normal breath sounds.  Abdominal:     General: There is no distension.     Palpations: Abdomen is soft.     Tenderness: There is no abdominal tenderness. There is no guarding or rebound.  Musculoskeletal:        General: Normal range of motion.     Cervical back: Normal range of motion and neck supple.  Skin:    General: Skin is warm and dry.   Neurological:     Mental Status: He is alert and oriented to person, place, and time.     GCS: GCS eye subscore is 4. GCS verbal subscore is 5. GCS motor subscore is 6.     Comments: Mental Status: Alert, oriented, thought content appropriate, able to give a coherent history. Speech fluent without evidence of aphasia. Able to follow 2 step commands without difficulty. Cranial Nerves: II: Peripheral visual fields grossly normal, pupils equal, round, reactive to light III,IV, VI: ptosis not present, extra-ocular motions intact bilaterally V,VII: smile symmetric, eyebrows raise symmetric, facial light touch sensation equal VIII: hearing grossly normal to voice X: uvula elevates symmetrically XI: bilateral shoulder shrug symmetric and strong XII: midline tongue extension without fassiculations Motor: Normal tone. 4/5 strength in upper and lower extremities which patient reports is baseline. Sensory: Sensation intact to light touch in all extremities.Negative Romberg.  Deep Tendon Reflexes: 2+ and symmetric patella Cerebellar: normal finger-to-nose with bilateral upper extremities. Normal heel-to -shin balance bilaterally of the lower extremity. No pronator drift.  Gait: normal gait and balance CV: distal pulses palpable throughout  Psychiatric:        Mood and Affect: Mood normal.        Behavior: Behavior normal.     ED Results / Procedures / Treatments   Labs (all labs ordered are listed, but only abnormal results are displayed) Labs Reviewed  BASIC METABOLIC PANEL - Abnormal; Notable for the following components:      Result Value   Sodium 154 (*)    Chloride 83 (*)    CO2 18 (*)    Calcium 5.9 (*)    Anion gap 53 (*)    All other components within normal limits  CBC - Abnormal; Notable for the following components:   RBC 4.17 (*)    Hemoglobin 12.9 (*)    All other components within normal limits  TROPONIN I (HIGH SENSITIVITY)  TROPONIN I (HIGH SENSITIVITY)     EKG EKG Interpretation  Date/Time:  Wednesday September 22 2019  12:31:29 EST Ventricular Rate:  53 PR Interval:    QRS Duration: 92 QT Interval:  430 QTC Calculation: 404 R Axis:   -48 Text Interpretation: Sinus rhythm Left anterior fascicular block no sig change from previous Confirmed by Charlesetta Shanks [phone]) on 09/22/2019 2:52:11 PM   Radiology DG Chest 2 View  Result Date: 09/22/2019 CLINICAL DATA:  48 year old male with chest pain. EXAM: CHEST - 2 VIEW COMPARISON:  Chest radiograph dated 01/23/2019. FINDINGS: The lungs are clear. There is no pleural effusion or pneumothorax. The cardiac silhouette is within normal limits. No acute osseous pathology. Cervical ACDF. IMPRESSION: No active cardiopulmonary disease. Electronically Signed   By: Anner Crete M.D.   On: 09/22/2019 13:17    Procedures Procedures (including critical care time)  Medications Ordered in ED Medications - No data to display  ED Course  I have reviewed the triage vital signs and the nursing notes.  Pertinent labs & imaging results that were available during my care of the patient were reviewed by me and considered in my medical decision making (see chart for details).  Clinical Course as of Sep 21 2114  Wed Sep 22, 2019  1703 MRI Brain, Coreg, delta troponin   [BM]  1716 Outpatient follow-up for ESR/CRP   [BM]  2114 Dr. Roslynn Amble   [BM]    Clinical Course User Index [BM] Gari Crown   MDM Rules/Calculators/A&P                     On initial evaluation 48 year old male well-appearing and in no acute distress reports history of left-sided headache yesterday with confusion, subsequently resolved then he had some chest pain which is also resolved.  Today he reports he developed right-sided headache without confusion which has again resolved no other concerns.  On examination he has baseline left extremity 4/5 strength which he reports is unchanged.  Cranial nerves intact, no  meningeal signs, lungs clear bilaterally, heart regular rate and rhythm without murmur, abdomen soft nontender without peritoneal signs, neurovascular tact to all 4 extremities without evidence of DVT, vital signs stable on room air. - CBC hemoglobin 12.9, no leukocytosis, nonacute BMP sodium 154, bicarb 18, chloride 83, calcium 5.9, anion gap 53. Initial HS-troponin: <2 Chest x-ray: IMPRESSION:  No active cardiopulmonary disease.     EKG: Sinus rhythm Left anterior fascicular block no sig change from previous Confirmed by Charlesetta Shanks [phone]) on 09/22/2019 2:52:11 PM   Question lab error, discussed with Dr. Vallery Ridge will begin repletion but repeat labs to confirm.  CT head, CMP, TSH, T3, T4, magnesium, phosphorus, delta troponin pending. - CMP with sodium 134, chloride 96 all else within normal limits Phosphorus within normal limits Magnesium within normal limits Delta high-sensitivity troponin: 3 Free T4: Within normal limits TSH within normal limits CT head:  IMPRESSION:  1. No acute intracranial abnormality.  2. Unchanged right cerebral hemisphere porencephalic cyst.   BMP earlier appears to be erroneous.  Patient was seen and evaluated by Dr. Vallery Ridge, his story has no change she reports he stopped using Xanax 5 days ago instead of 2 weeks ago as initially reported.  CIWA protocol ordered.  CRP 1.0, ESR 35.  Very low suspicion for temporal arteritis as etiology of patient's symptoms, he denies any visual changes and his headache has switched from one side to the other, discussed with Dr. Vallery Ridge patient may follow-up on elevated ESR and sed rate as outpatient.  Plan of care at this time is to  obtain MRI of the brain, if negative anticipate discharge.  Additionally will give home dose Coreg. - Patient reevaluated resting comfortably no acute distress he has no complaints at this time he is agreeable to wait for MRI brain.  On reevaluation I again rediscussed patient's changing  story regarding his Xanax, on third reevaluation patient reports that he did have in fact a Xanax around noon today, he reports that he only quit taking his wife's Xanax but has still been taking his own Xanax. - 9:10 PM: Unfortunately patient is too large for our MRI machine at [facility] long hospital and will need transfer to [facility] for their 500 pound limit machine.  I discussed plan of care with patient and he is agreeable.  He denies any concerns at this time reports he is feeling well.  On reevaluation patient is resting comfortably and in no acute distress. - 9:14 PM: Discussed with Dr. Roslynn Amble at [facility] who is accepting physician. - Nursing staff arranging transfer to [facility] for MRI brain.    Note: Portions of this report may have been transcribed using voice recognition software. Every effort was made to ensure accuracy; however, inadvertent computerized transcription errors may still be present. Final Clinical Impression(s) / ED Diagnoses Final diagnoses:  None    Rx / DC Orders ED Discharge Orders    None       Gari Crown 09/22/19 2129    Charlesetta Shanks, MD 09/26/19 1554

## 2019-09-22 NOTE — ED Notes (Signed)
Patient transported to MRI 

## 2019-09-22 NOTE — ED Notes (Signed)
Spoke with Roy [name] in MRI, we will attempt to obtain an MRI.

## 2019-09-22 NOTE — ED Notes (Signed)
Spoke with patient, he does not think he can go into the MRI machine due to being claustrophobia. Patient agreed to try Ativan IV.

## 2019-09-23 ENCOUNTER — Emergency Department (HOSPITAL_COMMUNITY): Payer: Medicare HMO

## 2019-09-23 DIAGNOSIS — R0789 Other chest pain: Secondary | ICD-10-CM | POA: Diagnosis not present

## 2019-09-23 DIAGNOSIS — I10 Essential (primary) hypertension: Secondary | ICD-10-CM | POA: Diagnosis not present

## 2019-09-23 DIAGNOSIS — R079 Chest pain, unspecified: Secondary | ICD-10-CM | POA: Diagnosis not present

## 2019-09-23 DIAGNOSIS — R410 Disorientation, unspecified: Secondary | ICD-10-CM | POA: Diagnosis not present

## 2019-09-23 LAB — T3, FREE: T3, Free: 2.4 pg/mL (ref 2.0–4.4)

## 2019-09-23 NOTE — Discharge Instructions (Addendum)
You were seen today for atypical chest pain and headache.  Your work-up is reassuring.  Follow-up closely with your primary physician for ongoing issues.

## 2019-09-23 NOTE — ED Provider Notes (Addendum)
12:43 AM  Patient transferred from [facility] for MRI.  Too large for MRI at [facility].  Reports continued headache that waxes and wanes.  However, currently he states that he is comfortable and does not require any medication.  He is awake, alert, oriented.  Vital signs reviewed and largely reassuring.  Will alert MRI of his arrival.   Merryl Hacker, MD 09/23/19 0044  3:03 AM MRI negative.  Patient stable.  Patient informed of negative MRI results.  Will discharge home and have him follow-up with his primary physician as needed.  After history, exam, and medical workup I feel the patient has been appropriately medically screened and is safe for discharge home. Pertinent diagnoses were discussed with the patient. Patient was given return precautions.     Merryl Hacker, MD 09/23/19 [phone]

## 2019-09-23 NOTE — ED Notes (Signed)
Pt arrived by EMS as [facility] transfer for MRI. Pt reported brain fogginess and headache; recent stopped taking Xanax; attempted MRI at [facility] but unable to tolerate. Sent for MRI. Has had open MRI in the past. Currently A&Ox4

## 2019-09-30 ENCOUNTER — Other Ambulatory Visit: Payer: Self-pay | Admitting: Family Medicine

## 2019-09-30 DIAGNOSIS — M5416 Radiculopathy, lumbar region: Secondary | ICD-10-CM

## 2019-09-30 NOTE — Telephone Encounter (Signed)
OV 12/07/19

## 2019-10-20 ENCOUNTER — Other Ambulatory Visit: Payer: Self-pay | Admitting: Family Medicine

## 2019-11-30 ENCOUNTER — Other Ambulatory Visit: Payer: Self-pay | Admitting: Family Medicine

## 2019-12-02 ENCOUNTER — Other Ambulatory Visit: Payer: Self-pay

## 2019-12-02 ENCOUNTER — Ambulatory Visit: Payer: Medicare HMO | Admitting: Family

## 2019-12-02 ENCOUNTER — Ambulatory Visit (INDEPENDENT_AMBULATORY_CARE_PROVIDER_SITE_OTHER): Payer: Medicare HMO | Admitting: Family

## 2019-12-02 ENCOUNTER — Encounter: Payer: Self-pay | Admitting: Family

## 2019-12-02 VITALS — BP 133/72 | HR 67 | Temp 97.5°F | Ht 68.0 in | Wt 357.4 lb

## 2019-12-02 DIAGNOSIS — R5383 Other fatigue: Secondary | ICD-10-CM | POA: Diagnosis not present

## 2019-12-02 DIAGNOSIS — F411 Generalized anxiety disorder: Secondary | ICD-10-CM

## 2019-12-02 DIAGNOSIS — M5416 Radiculopathy, lumbar region: Secondary | ICD-10-CM | POA: Diagnosis not present

## 2019-12-02 DIAGNOSIS — M79652 Pain in left thigh: Secondary | ICD-10-CM | POA: Diagnosis not present

## 2019-12-02 DIAGNOSIS — M79651 Pain in right thigh: Secondary | ICD-10-CM

## 2019-12-02 NOTE — Progress Notes (Signed)
Subjective:    Patient ID: Roy [name], male    DOB: May 19, 1971, 49 y.o.   MRN: [id number]  Chief Complaint  Patient presents with  . Leg Pain    bilateral leg pain, no energy x 2 weeks.     HPI PT presents to the office today with bilateral thigh pian that started two weeks ago. Denies any injury.   He he quit smoking about 4 months ago and is currently still taking chantix.    He reports fatigue over the last two weeks and is gradually worsening. He reports it is hard to get up even shower or go anywhere because he knows he will be in pain.   He has chronic lumbar pain that comes and goes and is worse if he stands for long periods of time.   He reports SOB when exertion. Denies any blood in stools, fevers, sick contacts, nausea, or vomiting.   He had lab work on 09/22/19 with a stable TSH and CBC.   He has gained 27 lbs over the last year and feels like this has made his back worse.   Review of Systems  All other systems reviewed and are negative.      Objective:   Physical Exam Vitals reviewed.  Constitutional:      General: He is not in acute distress.    Appearance: He is well-developed. He is obese.  HENT:     Head: Normocephalic.  Eyes:     General:        Right eye: No discharge.        Left eye: No discharge.     Pupils: Pupils are equal, round, and reactive to light.  Neck:     Thyroid: No thyromegaly.  Cardiovascular:     Rate and Rhythm: Normal rate and regular rhythm.     Heart sounds: Normal heart sounds. No murmur.  Pulmonary:     Effort: Pulmonary effort is normal. No respiratory distress.     Breath sounds: Normal breath sounds. No wheezing.  Abdominal:     General: Bowel sounds are normal. There is no distension.     Palpations: Abdomen is soft.     Tenderness: There is no abdominal tenderness.  Musculoskeletal:        General: No tenderness.     Cervical back: Normal range of motion and neck supple.     Comments: Pain in lumbar with  flexion and extension   Skin:    General: Skin is warm and dry.     Findings: No erythema or rash.  Neurological:     Mental Status: He is alert and oriented to person, place, and time.     Cranial Nerves: No cranial nerve deficit.     Deep Tendon Reflexes: Reflexes are normal and symmetric.  Psychiatric:        Behavior: Behavior normal.        Thought Content: Thought content normal.        Judgment: Judgment normal.       BP 133/72   Pulse 67   Temp (!) 97.5 F (36.4 C) (Temporal)   Ht _0  (1.727 m)   Wt (!) 357 lb 6.4 oz (162.1 kg)   BMI 54.34 kg/m      Assessment & Plan:  DEMORRIS CHOYCE comes in today with chief complaint of Leg Pain (bilateral leg pain, no energy x 2 weeks. )   Diagnosis and orders addressed:  1. Bilateral thigh  pain Pt already taking Celebrex  - CMP14+EGFR - CBC with Differential/Platelet - CK  2. Fatigue, unspecified type Rest Pt is morbid obese with depression and I feel this is causing some of the increase in his fatigue - CMP14+EGFR - CBC with Differential/Platelet  3. Morbid obesity ([place]) - CMP14+EGFR - CBC with Differential/Platelet  4. GAD (generalized anxiety disorder) -Pt does not wish to see [facility] at this time. He will discuss with his PCP who manages his medications.  - CMP14+EGFR - CBC with Differential/Platelet  5. Lumbar radiculopathy - CMP14+EGFR - CBC with Differential/Platelet    Labs pending, if normal may need to see specialists about chronic back pain.  Health Maintenance reviewed Diet and exercise encouraged  Follow up plan: Keep follow up with PCP

## 2019-12-02 NOTE — Patient Instructions (Signed)

## 2019-12-03 LAB — CBC WITH DIFFERENTIAL/PLATELET
Basophils Absolute: 0 10*3/uL (ref 0.0–0.2)
Basos: 1 %
EOS (ABSOLUTE): 0.1 10*3/uL (ref 0.0–0.4)
Eos: 2 %
Hematocrit: 41.7 % (ref 37.5–51.0)
Hemoglobin: 14.1 g/dL (ref 13.0–17.7)
Immature Grans (Abs): 0 10*3/uL (ref 0.0–0.1)
Immature Granulocytes: 1 %
Lymphocytes Absolute: 1.7 10*3/uL (ref 0.7–3.1)
Lymphs: 33 %
MCH: 30.7 pg (ref 26.6–33.0)
MCHC: 33.8 g/dL (ref 31.5–35.7)
MCV: 91 fL (ref 79–97)
Monocytes Absolute: 0.3 10*3/uL (ref 0.1–0.9)
Monocytes: 5 %
Neutrophils Absolute: 3 10*3/uL (ref 1.4–7.0)
Neutrophils: 58 %
Platelets: 271 10*3/uL (ref 150–450)
RBC: 4.59 x10E6/uL (ref 4.14–5.80)
RDW: 13.7 % (ref 11.6–15.4)
WBC: 5.2 10*3/uL (ref 3.4–10.8)

## 2019-12-03 LAB — CMP14+EGFR
ALT: 14 IU/L (ref 0–44)
AST: 16 IU/L (ref 0–40)
Albumin/Globulin Ratio: 1.6 (ref 1.2–2.2)
Albumin: 4.4 g/dL (ref 4.0–5.0)
Alkaline Phosphatase: 73 IU/L (ref 39–117)
BUN/Creatinine Ratio: 10 (ref 9–20)
BUN: 9 mg/dL (ref 6–24)
Bilirubin Total: <0.2 mg/dL (ref 0.0–1.2)
CO2: 20 mmol/L (ref 20–29)
Calcium: 9.3 mg/dL (ref 8.7–10.2)
Chloride: 102 mmol/L (ref 96–106)
Creatinine, Ser: 0.93 mg/dL (ref 0.76–1.27)
GFR calc Af Amer: 113 mL/min/{1.73_m2} (ref >59)
GFR calc non Af Amer: 98 mL/min/{1.73_m2} (ref >59)
Globulin, Total: 2.7 g/dL (ref 1.5–4.5)
Glucose: 84 mg/dL (ref 65–99)
Potassium: 4.4 mmol/L (ref 3.5–5.2)
Sodium: 140 mmol/L (ref 134–144)
Total Protein: 7.1 g/dL (ref 6.0–8.5)

## 2019-12-03 LAB — CK: Total CK: 180 U/L (ref 49–439)

## 2019-12-07 ENCOUNTER — Ambulatory Visit (INDEPENDENT_AMBULATORY_CARE_PROVIDER_SITE_OTHER): Payer: Medicare HMO

## 2019-12-07 ENCOUNTER — Telehealth: Payer: Self-pay | Admitting: *Deleted

## 2019-12-07 ENCOUNTER — Other Ambulatory Visit: Payer: Self-pay

## 2019-12-07 ENCOUNTER — Ambulatory Visit (INDEPENDENT_AMBULATORY_CARE_PROVIDER_SITE_OTHER): Payer: Medicare HMO | Admitting: Family Medicine

## 2019-12-07 ENCOUNTER — Encounter: Payer: Self-pay | Admitting: Family Medicine

## 2019-12-07 VITALS — BP 137/89 | HR 60 | Temp 98.0°F | Ht 68.0 in | Wt 355.4 lb

## 2019-12-07 DIAGNOSIS — I10 Essential (primary) hypertension: Secondary | ICD-10-CM | POA: Diagnosis not present

## 2019-12-07 DIAGNOSIS — M5416 Radiculopathy, lumbar region: Secondary | ICD-10-CM | POA: Diagnosis not present

## 2019-12-07 DIAGNOSIS — J40 Bronchitis, not specified as acute or chronic: Secondary | ICD-10-CM | POA: Diagnosis not present

## 2019-12-07 DIAGNOSIS — F411 Generalized anxiety disorder: Secondary | ICD-10-CM | POA: Diagnosis not present

## 2019-12-07 DIAGNOSIS — E038 Other specified hypothyroidism: Secondary | ICD-10-CM | POA: Diagnosis not present

## 2019-12-07 DIAGNOSIS — J418 Mixed simple and mucopurulent chronic bronchitis: Secondary | ICD-10-CM | POA: Diagnosis not present

## 2019-12-07 DIAGNOSIS — Z720 Tobacco use: Secondary | ICD-10-CM | POA: Diagnosis not present

## 2019-12-07 DIAGNOSIS — Z6841 Body Mass Index (BMI) 40.0 and over, adult: Secondary | ICD-10-CM | POA: Diagnosis not present

## 2019-12-07 DIAGNOSIS — E349 Endocrine disorder, unspecified: Secondary | ICD-10-CM | POA: Diagnosis not present

## 2019-12-07 DIAGNOSIS — K2100 Gastro-esophageal reflux disease with esophagitis, without bleeding: Secondary | ICD-10-CM | POA: Diagnosis not present

## 2019-12-07 DIAGNOSIS — E039 Hypothyroidism, unspecified: Secondary | ICD-10-CM

## 2019-12-07 DIAGNOSIS — R0602 Shortness of breath: Secondary | ICD-10-CM | POA: Diagnosis not present

## 2019-12-07 DIAGNOSIS — K219 Gastro-esophageal reflux disease without esophagitis: Secondary | ICD-10-CM

## 2019-12-07 DIAGNOSIS — E782 Mixed hyperlipidemia: Secondary | ICD-10-CM

## 2019-12-07 MED ORDER — DULOXETINE HCL 60 MG PO CPEP: 120.0000 mg | ORAL_CAPSULE | Freq: Every day | ORAL | 1 refills | Status: DC

## 2019-12-07 MED ORDER — QUETIAPINE FUMARATE 50 MG PO TABS: ORAL_TABLET | ORAL | 1 refills | Status: DC

## 2019-12-07 MED ORDER — LEVOTHYROXINE SODIUM 50 MCG PO TABS: 50.0000 ug | ORAL_TABLET | Freq: Every day | ORAL | 2 refills | Status: DC

## 2019-12-07 MED ORDER — DEXILANT 60 MG PO CPDR: 1.0000 | DELAYED_RELEASE_CAPSULE | Freq: Every day | ORAL | 1 refills | Status: DC

## 2019-12-07 MED ORDER — CELECOXIB 200 MG PO CAPS: 200.0000 mg | ORAL_CAPSULE | Freq: Every day | ORAL | 3 refills | Status: DC

## 2019-12-07 MED ORDER — TESTOSTERONE 20.25 MG/ACT (1.62%) TD GEL: TRANSDERMAL | 5 refills | Status: DC

## 2019-12-07 MED ORDER — TRAZODONE HCL 100 MG PO TABS: 100.0000 mg | ORAL_TABLET | Freq: Every day | ORAL | 3 refills | Status: DC

## 2019-12-07 MED ORDER — CARVEDILOL 6.25 MG PO TABS: 6.2500 mg | ORAL_TABLET | Freq: Two times a day (BID) | ORAL | 1 refills | Status: DC

## 2019-12-07 MED ORDER — BUDESONIDE-FORMOTEROL FUMARATE 160-4.5 MCG/ACT IN AERO: INHALATION_SPRAY | RESPIRATORY_TRACT | 11 refills | Status: DC

## 2019-12-07 MED ORDER — GEMFIBROZIL 600 MG PO TABS: 600.0000 mg | ORAL_TABLET | Freq: Two times a day (BID) | ORAL | 5 refills | Status: DC

## 2019-12-07 MED ORDER — GABAPENTIN 300 MG PO CAPS: 300.0000 mg | ORAL_CAPSULE | Freq: Three times a day (TID) | ORAL | 1 refills | Status: DC

## 2019-12-07 MED ORDER — ALBUTEROL SULFATE HFA 108 (90 BASE) MCG/ACT IN AERS: 1.0000 | INHALATION_SPRAY | Freq: Four times a day (QID) | RESPIRATORY_TRACT | 2 refills | Status: DC | PRN

## 2019-12-07 MED ORDER — ALPRAZOLAM 0.5 MG PO TABS: 0.5000 mg | ORAL_TABLET | Freq: Three times a day (TID) | ORAL | 5 refills | Status: DC | PRN

## 2019-12-07 MED ORDER — VARENICLINE TARTRATE 1 MG PO TABS: 1.0000 mg | ORAL_TABLET | Freq: Two times a day (BID) | ORAL | 5 refills | Status: DC

## 2019-12-07 MED ORDER — BETAMETHASONE SOD PHOS & ACET 6 (3-3) MG/ML IJ SUSP
6.0000 mg | Freq: Once | INTRAMUSCULAR | Status: AC
  Administered 2019-12-07: 6 mg via INTRAMUSCULAR

## 2019-12-07 MED ORDER — DULOXETINE HCL 60 MG PO CPEP: 120.0000 mg | ORAL_CAPSULE | Freq: Every day | ORAL | Status: DC

## 2019-12-07 MED ORDER — SILDENAFIL CITRATE 20 MG PO TABS: ORAL_TABLET | ORAL | 1 refills | Status: AC

## 2019-12-07 MED ORDER — LINACLOTIDE 290 MCG PO CAPS: 290.0000 ug | ORAL_CAPSULE | Freq: Every day | ORAL | 1 refills | Status: DC | PRN

## 2019-12-07 NOTE — Telephone Encounter (Signed)
PA request completed on CoverMyMeds.   Key: [id number]  Request sent to [facility]

## 2019-12-07 NOTE — Progress Notes (Signed)
Subjective:  Patient ID: Roy [name], male    DOB: [birth year]  Age: 49 y.o. MRN: [id number]  CC: Follow-up (6 month)   HPI Roy [name] presents for  follow-up on  thyroid. The patient has a history of hypothyroidism for many years. It has been stable recently. Pt. denies any change in  voice, loss of hair, heat or cold intolerance. Energy level has been adequate to good. Patient denies constipation and diarrhea. No myxedema. Medication is as noted below. Verified that pt is taking it daily on an empty stomach. Well tolerated.   presents for  follow-up of hypertension. Patient has no history of headache chest pain orrecent cough. Patient also denies symptoms of TIA such as focal numbness or weakness. Patient denies side effects from medication. States taking it regularly.  Patient says he has been aching all over.  Particularly his legs and the back he has had previous surgeries on his right shoulder and left hip they are hurting him.  Additionally his shortness of breath has gotten worse.   Depression screen [facility] 2/9 12/07/2019 06/09/2019 07/20/2018  Decreased Interest 2 0 0  Down, Depressed, Hopeless 2 0 0  PHQ - 2 Score 4 0 0  Altered sleeping 1 - -  Tired, decreased energy 2 - -  Change in appetite 0 - -  Feeling bad or failure about yourself  0 - -  Trouble concentrating 0 - -  Moving slowly or fidgety/restless 0 - -  Suicidal thoughts 0 - -  PHQ-9 Score 7 - -  Difficult doing work/chores Somewhat difficult - -  Some recent data might be hidden    History Roy [name] has a past medical history of Anxiety, Depression, Hypertension, Low testosterone, Median nerve dysfunction, Radiculopathy of cervical region, Thyroid disease, Ulnar neuropathy at elbow, and Weakness.   He has a past surgical history that includes Rotator cuff repair (Right); Biceps tendon repair; and Neck surgery.   His family history includes Arrhythmia in his father; Asthma in his brother and daughter; Bipolar  disorder in his brother; COPD in his father; Fibromyalgia in his sister; Hypertension in his mother; Stroke (age of onset: 48) in his mother.He reports that he quit smoking about 2 years ago. His smoking use included cigarettes. He has a 15.00 pack-year smoking history. He has quit using smokeless tobacco.  His smokeless tobacco use included snuff and chew. He reports that he does not drink alcohol or use drugs.    ROS Review of Systems  Constitutional: Positive for fatigue. Negative for fever.  Respiratory: Positive for shortness of breath.   Cardiovascular: Negative for chest pain.  Musculoskeletal: Positive for arthralgias and myalgias.  Skin: Negative for rash.    Objective:  BP 137/89   Pulse 60   Temp 98 F (36.7 C) (Temporal)   Ht 5\' 8"  (1.727 m)   Wt (!) 355 lb 6.4 oz (161.2 kg)   BMI 54.04 kg/m   BP Readings from Last 3 Encounters:  12/07/19 137/89  12/02/19 133/72  09/23/19 (!) 122/97    Wt Readings from Last 3 Encounters:  12/07/19 (!) 355 lb 6.4 oz (161.2 kg)  12/02/19 (!) 357 lb 6.4 oz (162.1 kg)  06/09/19 (!) 333 lb (151 kg)     Physical Exam Constitutional:      General: He is not in acute distress.    Appearance: He is well-developed.  HENT:     Head: Normocephalic and atraumatic.     Right Ear: External ear  normal.     Left Ear: External ear normal.     Nose: Nose normal.  Eyes:     Conjunctiva/sclera: Conjunctivae normal.     Pupils: Pupils are equal, round, and reactive to light.  Cardiovascular:     Rate and Rhythm: Normal rate and regular rhythm.     Heart sounds: Normal heart sounds. No murmur.  Pulmonary:     Effort: Pulmonary effort is normal. No respiratory distress.     Breath sounds: Normal breath sounds. No wheezing or rales.  Abdominal:     Palpations: Abdomen is soft.     Tenderness: There is no abdominal tenderness.  Musculoskeletal:        General: Normal range of motion.     Cervical back: Normal range of motion and neck  supple.  Skin:    General: Skin is warm and dry.  Neurological:     Mental Status: He is alert and oriented to person, place, and time.     Deep Tendon Reflexes: Reflexes are normal and symmetric.  Psychiatric:        Behavior: Behavior normal.        Thought Content: Thought content normal.        Judgment: Judgment normal.       Assessment & Plan:   Roy [name] was seen today for follow-up.  Diagnoses and all orders for this visit:  Other specified hypothyroidism -     CBC with Differential/Platelet  GAD (generalized anxiety disorder) -     QUEtiapine (SEROQUEL) 50 MG tablet; TAKE 1 TABLET BY MOUTH AT BEDTIME FOR  NERVES -     Discontinue: DULoxetine (CYMBALTA) 60 MG capsule; Take 2 capsules (120 mg total) by mouth daily. -     ALPRAZolam (XANAX) 0.5 MG tablet; Take 1 tablet (0.5 mg total) by mouth 3 (three) times daily as needed for anxiety. -     CBC with Differential/Platelet -     DULoxetine (CYMBALTA) 60 MG capsule; Take 2 capsules (120 mg total) by mouth daily.  Benign essential HTN -     carvedilol (COREG) 6.25 MG tablet; Take 1 tablet (6.25 mg total) by mouth 2 (two) times daily with a meal. -     CBC with Differential/Platelet  Nicotine abuse -     varenicline (CHANTIX CONTINUING MONTH PAK) 1 MG tablet; Take 1 tablet (1 mg total) by mouth 2 (two) times daily. -     CBC with Differential/Platelet  Hypotestosteronism -     Testosterone (ANDROGEL PUMP) 20.25 MG/ACT (1.62%) GEL; Apply 4 pumps daily to upper chest and shoulders -     sildenafil (REVATIO) 20 MG tablet; Take 2-5 tablets daily as needed for sex -     CBC with Differential/Platelet -     Testosterone,Free and Total  Hypothyroidism, unspecified type -     levothyroxine (SYNTHROID) 50 MCG tablet; Take 1 tablet (50 mcg total) by mouth daily. -     TSH + free T4 -     CBC with Differential/Platelet  GERD with esophagitis -     CBC with Differential/Platelet  Lumbar radiculopathy -     gabapentin  (NEURONTIN) 300 MG capsule; Take 1 capsule (300 mg total) by mouth 3 (three) times daily. -     Discontinue: DULoxetine (CYMBALTA) 60 MG capsule; Take 2 capsules (120 mg total) by mouth daily. -     celecoxib (CELEBREX) 200 MG capsule; Take 1 capsule (200 mg total) by mouth daily. For arthritis -  CBC with Differential/Platelet -     DULoxetine (CYMBALTA) 60 MG capsule; Take 2 capsules (120 mg total) by mouth daily.  Bronchitis -     albuterol (VENTOLIN HFA) 108 (90 Base) MCG/ACT inhaler; Inhale 1-2 puffs into the lungs every 6 (six) hours as needed for wheezing or shortness of breath. -     CBC with Differential/Platelet  Gastroesophageal reflux disease, unspecified whether esophagitis present -     dexlansoprazole (DEXILANT) 60 MG capsule; Take 1 capsule (60 mg total) by mouth daily. -     CBC with Differential/Platelet  Mixed hyperlipidemia -     gemfibrozil (LOPID) 600 MG tablet; Take 1 tablet (600 mg total) by mouth 2 (two) times daily. -     CBC with Differential/Platelet  Shortness of breath -     CBC with Differential/Platelet -     DG Chest 2 View; Future -     Brain natriuretic peptide -     betamethasone acetate-betamethasone sodium phosphate (CELESTONE) injection 6 mg  Mixed simple and mucopurulent chronic bronchitis (HCC) -     budesonide-formoterol (SYMBICORT) 160-4.5 MCG/ACT inhaler; INHALE TWO PUFFS BY MOUTH TWICE DAILY FOR COPD/ASTHMA -     CBC with Differential/Platelet -     DG Chest 2 View; Future -     betamethasone acetate-betamethasone sodium phosphate (CELESTONE) injection 6 mg  Other orders -     traZODone (DESYREL) 100 MG tablet; Take 1 tablet (100 mg total) by mouth at bedtime. -     linaclotide (LINZESS) 290 MCG CAPS capsule; Take 1 capsule (290 mcg total) by mouth daily as needed (constipation).       I am having Roy [name]. Stammer maintain his acetaminophen, famotidine, varenicline, traZODone, Testosterone, sildenafil, QUEtiapine, linaclotide,  levothyroxine, gemfibrozil, gabapentin, Dexilant, celecoxib, carvedilol, budesonide-formoterol, ALPRAZolam, albuterol, and DULoxetine. We administered betamethasone acetate-betamethasone sodium phosphate.  Allergies as of 12/07/2019      Reactions   Penicillins Rash, Other (See Comments)   Has patient had a PCN reaction causing immediate rash, facial/tongue/throat swelling, SOB or lightheadedness with hypotension: Yes Has patient had a PCN reaction causing severe rash involving mucus membranes or skin necrosis: No Has patient had a PCN reaction that required hospitalization: No Has patient had a PCN reaction occurring within the last 10 years: No If all of the above answers are "NO", then may proceed with Cephalosporin use. Has patient had a PCN reaction causing immediate rash, facial/tongue/throat swelling, SOB or lightheadedness with hypotension: Yes Has patient had a PCN reaction causing severe rash involving mucus membranes or skin necrosis: No Has patient had a PCN reaction that required hospitalization: No Has patient had a PCN reaction occurring within the last 10 years: No If all of the above answers are "NO", then may proceed with Cephalosporin use. unknown   Chantix [varenicline] Other (See Comments)   Severe headache      Medication List       Accurate as of December 07, 2019  6:06 PM. If you have any questions, ask your nurse or doctor.        acetaminophen 500 MG tablet Commonly known as: TYLENOL Take 1,000 mg by mouth every 6 (six) hours as needed for headache (pain).   albuterol 108 (90 Base) MCG/ACT inhaler Commonly known as: VENTOLIN HFA Inhale 1-2 puffs into the lungs every 6 (six) hours as needed for wheezing or shortness of breath.   ALPRAZolam 0.5 MG tablet Commonly known as: XANAX Take 1 tablet (0.5 mg total) by mouth  3 (three) times daily as needed for anxiety.   budesonide-formoterol 160-4.5 MCG/ACT inhaler Commonly known as: Symbicort INHALE TWO PUFFS BY  MOUTH TWICE DAILY FOR COPD/ASTHMA What changed:   how much to take  how to take this  when to take this  additional instructions   carvedilol 6.25 MG tablet Commonly known as: COREG Take 1 tablet (6.25 mg total) by mouth 2 (two) times daily with a meal.   celecoxib 200 MG capsule Commonly known as: CeleBREX Take 1 capsule (200 mg total) by mouth daily. For arthritis   Dexilant 60 MG capsule Generic drug: dexlansoprazole Take 1 capsule (60 mg total) by mouth daily.   DULoxetine 60 MG capsule Commonly known as: CYMBALTA Take 2 capsules (120 mg total) by mouth daily.   famotidine 20 MG tablet Commonly known as: Pepcid Take 1 tablet (20 mg total) by mouth 2 (two) times daily for 14 days.   gabapentin 300 MG capsule Commonly known as: NEURONTIN Take 1 capsule (300 mg total) by mouth 3 (three) times daily.   gemfibrozil 600 MG tablet Commonly known as: LOPID Take 1 tablet (600 mg total) by mouth 2 (two) times daily.   levothyroxine 50 MCG tablet Commonly known as: SYNTHROID Take 1 tablet (50 mcg total) by mouth daily.   linaclotide 290 MCG Caps capsule Commonly known as: LINZESS Take 1 capsule (290 mcg total) by mouth daily as needed (constipation).   QUEtiapine 50 MG tablet Commonly known as: SEROQUEL TAKE 1 TABLET BY MOUTH AT BEDTIME FOR  NERVES   sildenafil 20 MG tablet Commonly known as: REVATIO Take 2-5 tablets daily as needed for sex   Testosterone 20.25 MG/ACT (1.62%) Gel Commonly known as: AndroGel Pump Apply 4 pumps daily to upper chest and shoulders   traZODone 100 MG tablet Commonly known as: DESYREL Take 1 tablet (100 mg total) by mouth at bedtime.   varenicline 1 MG tablet Commonly known as: Chantix Continuing Month Pak Take 1 tablet (1 mg total) by mouth 2 (two) times daily.      We reviewed the multifactorial possibilities for his achiness.  The increasing shortness of breath seems to be marginal.  However he had a chest x-ray which is  normal.  Labs are pending.  Likely just mild exacerbation of COPD.  He was advised to quit smoking completely although he says he is down to 2 cigarettes a week.  We will see how his labs help with defining the achiness and dyspnea etc.  Those are pending at this time.  Follow-up: No follow-ups on file.  Mechele Claude, M.D.

## 2019-12-07 NOTE — Progress Notes (Signed)
Your chest x-ray looked normal. Thanks, WS.

## 2019-12-08 LAB — BRAIN NATRIURETIC PEPTIDE: BNP: 26.9 pg/mL (ref 0.0–100.0)

## 2019-12-08 NOTE — Telephone Encounter (Signed)
Prior Auth for Testosterone 20.25 MG/ACT(1.62%) gel-APPROVED till 09/22/20  PA Case ID: [id number]  Pharmacy notified

## 2019-12-09 LAB — CBC WITH DIFFERENTIAL/PLATELET
Basophils Absolute: 0 10*3/uL (ref 0.0–0.2)
Basos: 1 %
EOS (ABSOLUTE): 0.1 10*3/uL (ref 0.0–0.4)
Eos: 2 %
Hematocrit: 43.1 % (ref 37.5–51.0)
Hemoglobin: 14.5 g/dL (ref 13.0–17.7)
Immature Grans (Abs): 0 10*3/uL (ref 0.0–0.1)
Immature Granulocytes: 1 %
Lymphocytes Absolute: 1.9 10*3/uL (ref 0.7–3.1)
Lymphs: 33 %
MCH: 30.7 pg (ref 26.6–33.0)
MCHC: 33.6 g/dL (ref 31.5–35.7)
MCV: 91 fL (ref 79–97)
Monocytes Absolute: 0.3 10*3/uL (ref 0.1–0.9)
Monocytes: 6 %
Neutrophils Absolute: 3.3 10*3/uL (ref 1.4–7.0)
Neutrophils: 57 %
Platelets: 271 10*3/uL (ref 150–450)
RBC: 4.73 x10E6/uL (ref 4.14–5.80)
RDW: 13.3 % (ref 11.6–15.4)
WBC: 5.7 10*3/uL (ref 3.4–10.8)

## 2019-12-09 LAB — TESTOSTERONE,FREE AND TOTAL
Testosterone, Free: 7.2 pg/mL (ref 6.8–21.5)
Testosterone: 223 ng/dL — ABNORMAL LOW (ref 264–916)

## 2019-12-09 LAB — TSH+FREE T4
Free T4: 0.86 ng/dL (ref 0.82–1.77)
TSH: 1.9 u[IU]/mL (ref 0.450–4.500)

## 2020-01-13 ENCOUNTER — Telehealth: Payer: Self-pay | Admitting: *Deleted

## 2020-01-13 NOTE — Telephone Encounter (Signed)
Prior Auth for Budesonide-Formoterol Fumarate 160-4.5MCG/ACT aerosol- In Process   [employer]   PA Case ID: [id number]  Your information has been submitted to [facility]. [employer] will review the request and will issue a decision, typically within 3-7 days from your submission. You can check the updated outcome later by reopening this request.  If [employer] has not responded in 3-7 days or if you have any questions about your ePA request, please contact [employer] at [phone]. If you think there may be a problem with your PA request, use our live chat feature at the bottom right.  For [place] requests, please call [phone].

## 2020-01-14 NOTE — Telephone Encounter (Signed)
Approved on April 22 PA Case: [id number], Status: Approved, Coverage Starts on: 01/13/2020 3:25:41 PM, Coverage Ends on: 01/13/2020 3:25:41 PM. Questions? Contact [phone].  Pharm and pt aware

## 2020-02-19 ENCOUNTER — Other Ambulatory Visit: Payer: Self-pay | Admitting: Family Medicine

## 2020-02-19 DIAGNOSIS — E782 Mixed hyperlipidemia: Secondary | ICD-10-CM

## 2020-02-28 ENCOUNTER — Encounter: Payer: Self-pay | Admitting: *Deleted

## 2020-06-02 ENCOUNTER — Emergency Department (HOSPITAL_COMMUNITY)
Admission: EM | Admit: 2020-06-02 | Discharge: 2020-06-02 | Disposition: A | Discharge status: Home / Self Care | Payer: Medicare HMO | Attending: Emergency Medicine | Admitting: Emergency Medicine

## 2020-06-02 ENCOUNTER — Emergency Department (HOSPITAL_COMMUNITY): Payer: Medicare HMO

## 2020-06-02 ENCOUNTER — Encounter (HOSPITAL_COMMUNITY): Payer: Self-pay

## 2020-06-02 ENCOUNTER — Other Ambulatory Visit: Payer: Self-pay

## 2020-06-02 DIAGNOSIS — I10 Essential (primary) hypertension: Secondary | ICD-10-CM | POA: Insufficient documentation

## 2020-06-02 DIAGNOSIS — U071 COVID-19: Secondary | ICD-10-CM

## 2020-06-02 DIAGNOSIS — E039 Hypothyroidism, unspecified: Secondary | ICD-10-CM | POA: Diagnosis not present

## 2020-06-02 DIAGNOSIS — Z23 Encounter for immunization: Secondary | ICD-10-CM | POA: Insufficient documentation

## 2020-06-02 DIAGNOSIS — R509 Fever, unspecified: Secondary | ICD-10-CM | POA: Diagnosis present

## 2020-06-02 DIAGNOSIS — J189 Pneumonia, unspecified organism: Secondary | ICD-10-CM | POA: Diagnosis not present

## 2020-06-02 DIAGNOSIS — J45909 Unspecified asthma, uncomplicated: Secondary | ICD-10-CM | POA: Diagnosis not present

## 2020-06-02 DIAGNOSIS — Z87891 Personal history of nicotine dependence: Secondary | ICD-10-CM | POA: Insufficient documentation

## 2020-06-02 DIAGNOSIS — Z79890 Hormone replacement therapy (postmenopausal): Secondary | ICD-10-CM | POA: Diagnosis not present

## 2020-06-02 DIAGNOSIS — R0602 Shortness of breath: Secondary | ICD-10-CM

## 2020-06-02 LAB — CBC WITH DIFFERENTIAL/PLATELET
Abs Immature Granulocytes: 0.02 10*3/uL (ref 0.00–0.07)
Basophils Absolute: 0 10*3/uL (ref 0.0–0.1)
Basophils Relative: 0 %
Eosinophils Absolute: 0.1 10*3/uL (ref 0.0–0.5)
Eosinophils Relative: 2 %
HCT: 43.2 % (ref 39.0–52.0)
Hemoglobin: 14.6 g/dL (ref 13.0–17.0)
Immature Granulocytes: 1 %
Lymphocytes Relative: 39 %
Lymphs Abs: 1.5 10*3/uL (ref 0.7–4.0)
MCH: 31.4 pg (ref 26.0–34.0)
MCHC: 33.8 g/dL (ref 30.0–36.0)
MCV: 92.9 fL (ref 80.0–100.0)
Monocytes Absolute: 0.3 10*3/uL (ref 0.1–1.0)
Monocytes Relative: 7 %
Neutro Abs: 2 10*3/uL (ref 1.7–7.7)
Neutrophils Relative %: 51 %
Platelets: 169 10*3/uL (ref 150–400)
RBC: 4.65 MIL/uL (ref 4.22–5.81)
RDW: 14 % (ref 11.5–15.5)
WBC: 3.9 10*3/uL — ABNORMAL LOW (ref 4.0–10.5)
nRBC: 0 % (ref 0.0–0.2)

## 2020-06-02 LAB — COMPREHENSIVE METABOLIC PANEL WITH GFR
ALT: 30 U/L (ref 0–44)
AST: 39 U/L (ref 15–41)
Albumin: 4.1 g/dL (ref 3.5–5.0)
Alkaline Phosphatase: 62 U/L (ref 38–126)
Anion gap: 14 (ref 5–15)
BUN: 7 mg/dL (ref 6–20)
CO2: 24 mmol/L (ref 22–32)
Calcium: 8.9 mg/dL (ref 8.9–10.3)
Chloride: 103 mmol/L (ref 98–111)
Creatinine, Ser: 0.8 mg/dL (ref 0.61–1.24)
GFR calc Af Amer: >60 mL/min
GFR calc non Af Amer: >60 mL/min
Glucose, Bld: 102 mg/dL — ABNORMAL HIGH (ref 70–99)
Potassium: 4.4 mmol/L (ref 3.5–5.1)
Sodium: 141 mmol/L (ref 135–145)
Total Bilirubin: 0.4 mg/dL (ref 0.3–1.2)
Total Protein: 7.4 g/dL (ref 6.5–8.1)

## 2020-06-02 LAB — TROPONIN I (HIGH SENSITIVITY)
Troponin I (High Sensitivity): 4 ng/L
Troponin I (High Sensitivity): 4 ng/L

## 2020-06-02 LAB — SARS CORONAVIRUS 2 BY RT PCR (HOSPITAL ORDER, PERFORMED IN ~~LOC~~ HOSPITAL LAB): SARS Coronavirus 2: POSITIVE — AB

## 2020-06-02 MED ORDER — FAMOTIDINE IN NACL 20-0.9 MG/50ML-% IV SOLN: 20.0000 mg | Freq: Once | INTRAVENOUS | Status: DC | PRN

## 2020-06-02 MED ORDER — EPINEPHRINE 0.3 MG/0.3ML IJ SOAJ: 0.3000 mg | Freq: Once | INTRAMUSCULAR | Status: DC | PRN

## 2020-06-02 MED ORDER — SODIUM CHLORIDE 0.9 % IV SOLN
1200.0000 mg | Freq: Once | INTRAVENOUS | Status: AC
  Administered 2020-06-02: 1200 mg via INTRAVENOUS
  Filled 2020-06-02: qty 10

## 2020-06-02 MED ORDER — PREDNISONE 20 MG PO TABS: 40.0000 mg | ORAL_TABLET | Freq: Every day | ORAL | 0 refills | Status: DC

## 2020-06-02 MED ORDER — METHYLPREDNISOLONE SODIUM SUCC 125 MG IJ SOLR: 125.0000 mg | Freq: Once | INTRAMUSCULAR | Status: DC | PRN

## 2020-06-02 MED ORDER — ALBUTEROL SULFATE HFA 108 (90 BASE) MCG/ACT IN AERS: 2.0000 | INHALATION_SPRAY | Freq: Once | RESPIRATORY_TRACT | Status: DC | PRN

## 2020-06-02 MED ORDER — DIPHENHYDRAMINE HCL 50 MG/ML IJ SOLN: 50.0000 mg | Freq: Once | INTRAMUSCULAR | Status: DC | PRN

## 2020-06-02 MED ORDER — SODIUM CHLORIDE 0.9 % IV SOLN: INTRAVENOUS | Status: DC | PRN

## 2020-06-02 MED ORDER — DOXYCYCLINE HYCLATE 100 MG PO CAPS: 100.0000 mg | ORAL_CAPSULE | Freq: Two times a day (BID) | ORAL | 0 refills | Status: DC

## 2020-06-02 NOTE — ED Triage Notes (Signed)
Patient reports that his daughter had Covid 13 days ago.  Patient c/o fever, chills, SOB, and Sats 91% at home. Patient's sats 93% in triage.

## 2020-06-02 NOTE — ED Provider Notes (Signed)
Patient seen by Daisy Lazar See note for full HPI. IM summation 49 year old here with recent Covid exposure and upper respiratory symptoms.  Found to be Covid positive.  He has had stable vital signs and is tolerating p.o. intake without difficulty.  He is ambulatory without any hypoxia.  He was given MAB infusion by peers provider.  Plan to discharge home once patient has had observation.Marland Kitchen  He was given strict return precautions. Physical Exam  BP (!) 165/99 (BP Location: Right Arm)   Pulse (!) 58   Temp 98.2 F (36.8 C) (Oral)   Resp 15   Ht 5\' 8"  (1.727 m)   Wt (!) 149.7 kg   SpO2 97%   BMI 50.18 kg/m   Physical Exam Vitals and nursing note reviewed.  Constitutional:      General: He is not in acute distress.    Appearance: He is well-developed. He is not diaphoretic.  HENT:     Head: Atraumatic.  Eyes:     Pupils: Pupils are equal, round, and reactive to light.  Cardiovascular:     Rate and Rhythm: Normal rate and regular rhythm.  Pulmonary:     Effort: Pulmonary effort is normal. No respiratory distress.  Abdominal:     General: There is no distension.     Palpations: Abdomen is soft.  Musculoskeletal:        General: Normal range of motion.     Cervical back: Normal range of motion and neck supple.  Skin:    General: Skin is warm and dry.  Neurological:     Mental Status: He is alert.     ED Course/Procedures    DG Chest Port 1 View  Result Date: 06/02/2020 CLINICAL DATA:  Worsening shortness of breath. EXAM: PORTABLE CHEST 1 VIEW COMPARISON:  12/07/2019. FINDINGS: Mediastinum and hilar structures normal. Heart size normal. Mild right mid lung infiltrate cannot be excluded. No pleural effusion or pneumothorax. Heart size stable. No acute bony abnormality. IMPRESSION: Mild right mid lung infiltrate cannot be excluded. Electronically Signed   By: 12/09/2019  Register   On: 06/02/2020 09:23    Procedures Labs Reviewed  SARS CORONAVIRUS 2 BY RT PCR (HOSPITAL ORDER,  PERFORMED IN [place] [facility]) - Abnormal; Notable for the following components:      Result Value   SARS Coronavirus 2 POSITIVE (*)    All other components within normal limits  CBC WITH DIFFERENTIAL/PLATELET - Abnormal; Notable for the following components:   WBC 3.9 (*)    All other components within normal limits  COMPREHENSIVE METABOLIC PANEL - Abnormal; Notable for the following components:   Glucose, Bld 102 (*)    All other components within normal limits  TROPONIN I (HIGH SENSITIVITY)  TROPONIN I (HIGH SENSITIVITY)   MDM   Patient seen by 08/02/2020 See note for full HPI. IM summation 49 year old here with recent Covid exposure and upper respiratory symptoms.  Found to be Covid positive.  He has had stable vital signs and is tolerating p.o. intake without difficulty.  He is ambulatory without any hypoxia.  He was given MAB infusion by peers provider.  Plan to discharge home once patient has had observation.57  He was given strict return precautions.  Patient reassessed.  No hypoxia.  Stable vital signs.  Was given prescription for doxycycline and prednisone by previous provider.  I discussed return precautions.  Patient voiced understanding is agreeable for follow-up.  The patient has been appropriately medically screened  and/or stabilized in the ED. I have low suspicion for any other emergent medical condition which would require further screening, evaluation or treatment in the ED or require inpatient management.  Patient is hemodynamically stable and in no acute distress.  Patient able to ambulate in department prior to ED.  Evaluation does not show acute pathology that would require ongoing or additional emergent interventions while in the emergency department or further inpatient treatment.  I have discussed the diagnosis with the patient and answered all questions.  Pain is been managed while in the emergency department and patient has no further complaints prior to  discharge.  Patient is comfortable with plan discussed in room and is stable for discharge at this time.  I have discussed strict return precautions for returning to the emergency department.  Patient was encouraged to follow-up with PCP/specialist refer to at discharge.     Summit Borchardt A, PA-C 06/02/20 1558    Linwood Dibbles, MD 06/03/20 [phone]

## 2020-06-02 NOTE — ED Provider Notes (Signed)
[facility]-EMERGENCY DEPT Provider Note   CSN: [id number] Arrival date & time: 06/02/20  0751     History Chief Complaint  Patient presents with   Fever   Chills   Shortness of Breath   Covid Exposure    Roy [name] is a 49 y.o. male.  Patient with history of asthma, tobacco abuse, elevated BMI --presents the emergency department today for shortness of breath.  Patient reports Covid contact at home with a 47 year old daughter.  He developed symptoms on September 2 including fever, chills and cough.  Patient states that he has not had a fever in several days however his shortness of breath has become worse.  He checked his oxygen saturation at home and it was around 90%.  Shortness of breath is worse with exertion.  Patient had mid chest pain yesterday which resolved.  No reported nausea, vomiting, diarrhea.  No history of ACS or blood clots.  Patient does endorse history of high blood pressure, high cholesterol. Patient denies other risk factors for pulmonary embolism including: unilateral leg swelling, history of DVT/PE/other blood clots, recent immobilizations, recent surgery, recent travel (>4hr segment), malignancy, hemoptysis. Pt has not been vaccinated for COVID-19.         Past Medical History:  Diagnosis Date   Anxiety    Depression    Hypertension    Low testosterone    Median nerve dysfunction    Radiculopathy of cervical region    Thyroid disease    Ulnar neuropathy at elbow    Weakness     Patient Active Problem List   Diagnosis Date Noted   Asthma 01/22/2019   GAD (generalized anxiety disorder) 01/06/2019   GERD (gastroesophageal reflux disease) 12/23/2017   Headache 01/22/2016   Nicotine abuse 12/21/2015   Benign essential HTN 12/21/2015   Hypothyroid 12/21/2015   Morbid obesity (HCC) 10/19/2015   Nontraumatic tear of right rotator cuff 06/04/2014   Cervical radiculopathy 08/11/2013   Lumbar  radiculopathy 08/11/2013   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   Hypotestosteronism 03/29/2013    Past Surgical History:  Procedure Laterality Date   BICEPS TENDON REPAIR     NECK SURGERY     Fusion done two seperate times   ROTATOR CUFF REPAIR Right        Family History  Problem Relation Age of Onset   Hypertension Mother    Stroke Mother [id number]   Arrhythmia Father    COPD Father    Asthma Brother    Bipolar disorder Brother    Asthma Daughter    Fibromyalgia Sister     Social History   Tobacco Use   Smoking status: Former Smoker    Packs/day: 0.50    Years: 30.00    Pack years: 15.00    Types: Cigarettes    Quit date: 07/23/2017    Years since quitting: 2.8   Smokeless tobacco: Former NeurosurgeonUser    Types: Snuff, Chew  Vaping Use   Vaping Use: Never used  Substance Use Topics   Alcohol use: No   Drug use: No    Home Medications Prior to Admission medications   Medication Sig Start Date End Date Taking? Authorizing Provider  acetaminophen (TYLENOL) 500 MG tablet Take 1,000 mg by mouth every 6 (six) hours as needed for headache (pain).    Yes [provider]  albuterol (VENTOLIN HFA) 108 (90 Base) MCG/ACT inhaler Inhale 1-2 puffs into the lungs every 6 (six) hours as needed for wheezing or shortness of  breath. 12/07/19  Yes Stacks, Broadus John, MD  ALPRAZolam Prudy Feeler) 0.5 MG tablet Take 1 tablet (0.5 mg total) by mouth 3 (three) times daily as needed for anxiety. 12/07/19  Yes Stacks, Broadus John, MD  budesonide-formoterol (SYMBICORT) 160-4.5 MCG/ACT inhaler INHALE TWO PUFFS BY MOUTH TWICE DAILY FOR COPD/ASTHMA Patient taking differently: Inhale 2 puffs into the lungs in the morning and at bedtime.  12/07/19  Yes Mechele Claude, MD  carvedilol (COREG) 6.25 MG tablet Take 1 tablet (6.25 mg total) by mouth 2 (two) times daily with a meal. 12/07/19  Yes Stacks, Broadus John, MD  celecoxib (CELEBREX) 200 MG capsule Take 1 capsule (200 mg total) by mouth daily. For arthritis 12/07/19   Yes Stacks, Broadus John, MD  dexlansoprazole (DEXILANT) 60 MG capsule Take 1 capsule (60 mg total) by mouth daily. 12/07/19  Yes Stacks, Broadus John, MD  DULoxetine (CYMBALTA) 60 MG capsule Take 2 capsules (120 mg total) by mouth daily. 12/07/19  Yes Mechele Claude, MD  gabapentin (NEURONTIN) 300 MG capsule Take 1 capsule (300 mg total) by mouth 3 (three) times daily. 12/07/19  Yes Stacks, Broadus John, MD  levothyroxine (SYNTHROID) 50 MCG tablet Take 1 tablet (50 mcg total) by mouth daily. 12/07/19  Yes Stacks, Broadus John, MD  linaclotide Karlene Einstein) 290 MCG CAPS capsule Take 1 capsule (290 mcg total) by mouth daily as needed (constipation). 12/07/19  Yes Stacks, Broadus John, MD  QUEtiapine (SEROQUEL) 50 MG tablet TAKE 1 TABLET BY MOUTH AT BEDTIME FOR  NERVES Patient taking differently: Take 50 mg by mouth at bedtime.  12/07/19  Yes Mechele Claude, MD  sildenafil (REVATIO) 20 MG tablet Take 2-5 tablets daily as needed for sex 12/07/19  Yes Mechele Claude, MD  traZODone (DESYREL) 100 MG tablet Take 1 tablet (100 mg total) by mouth at bedtime. 12/07/19  Yes Stacks, Broadus John, MD  doxycycline (VIBRAMYCIN) 100 MG capsule Take 1 capsule (100 mg total) by mouth 2 (two) times daily. 06/02/20   Renne Crigler, PA-C  predniSONE (DELTASONE) 20 MG tablet Take 2 tablets (40 mg total) by mouth daily. 06/02/20   Renne Crigler, PA-C  famotidine (PEPCID) 20 MG tablet Take 1 tablet (20 mg total) by mouth 2 (two) times daily for 14 days. Patient not taking: Reported on 06/02/2020 06/09/19 06/02/20  Mechele Claude, MD  gemfibrozil (LOPID) 600 MG tablet Take 1 tablet by mouth twice daily Patient not taking: Reported on 06/02/2020 02/22/20 06/02/20  Mechele Claude, MD  Testosterone (ANDROGEL PUMP) 20.25 MG/ACT (1.62%) GEL Apply 4 pumps daily to upper chest and shoulders Patient not taking: Reported on 06/02/2020 12/07/19 06/02/20  Mechele Claude, MD    Allergies    Penicillins and Chantix [varenicline]  Review of Systems   Review of Systems  Constitutional:  Positive for chills and fever (resolved).  HENT: Negative for rhinorrhea and sore throat.   Eyes: Negative for redness.  Respiratory: Positive for shortness of breath and wheezing (Intermittent). Negative for cough.   Cardiovascular: Positive for chest pain (resolved). Negative for leg swelling.  Gastrointestinal: Negative for abdominal pain, diarrhea, nausea and vomiting.  Genitourinary: Negative for dysuria and hematuria.  Musculoskeletal: Negative for myalgias.  Skin: Negative for rash.  Neurological: Negative for headaches.    Physical Exam Updated Vital Signs BP (!) 155/92 (BP Location: Right Arm)    Pulse 69    Temp 98.7 F (37.1 C) (Oral)    Resp 18    Ht 5\' 8"  (1.727 m)    Wt (!) 149.7 kg    SpO2 95%    BMI  50.18 kg/m   Physical Exam Vitals and nursing note reviewed.  Constitutional:      Appearance: He is well-developed.  HENT:     Head: Normocephalic and atraumatic.  Eyes:     General:        Right eye: No discharge.        Left eye: No discharge.     Conjunctiva/sclera: Conjunctivae normal.  Cardiovascular:     Rate and Rhythm: Normal rate and regular rhythm.     Heart sounds: Normal heart sounds.  Pulmonary:     Effort: Pulmonary effort is normal.     Breath sounds: Normal breath sounds. No decreased breath sounds, wheezing or rales.  Abdominal:     Palpations: Abdomen is soft.     Tenderness: There is no abdominal tenderness.  Musculoskeletal:     Cervical back: Normal range of motion and neck supple.  Skin:    General: Skin is warm and dry.  Neurological:     Mental Status: He is alert.     ED Results / Procedures / Treatments   Labs (all labs ordered are listed, but only abnormal results are displayed) Labs Reviewed  SARS CORONAVIRUS 2 BY RT PCR (HOSPITAL ORDER, PERFORMED IN [facility]) - Abnormal; Notable for the following components:      Result Value   SARS Coronavirus 2 POSITIVE (*)    All other components within normal limits    CBC WITH DIFFERENTIAL/PLATELET - Abnormal; Notable for the following components:   WBC 3.9 (*)    All other components within normal limits  COMPREHENSIVE METABOLIC PANEL - Abnormal; Notable for the following components:   Glucose, Bld 102 (*)    All other components within normal limits  TROPONIN I (HIGH SENSITIVITY)  TROPONIN I (HIGH SENSITIVITY)    EKG EKG Interpretation  Date/Time:  Friday June 02 2020 08:17:59 EDT Ventricular Rate:  69 PR Interval:    QRS Duration: 92 QT Interval:  406 QTC Calculation: 435 R Axis:   -18 Text Interpretation: Sinus rhythm Borderline left axis deviation Abnormal R-wave progression, late transition 12 Lead; Mason-Likar Confirmed by Raeford Razor [phone]) on 06/02/2020 8:40:54 AM   Radiology DG Chest Port 1 View  Result Date: 06/02/2020 CLINICAL DATA:  Worsening shortness of breath. EXAM: PORTABLE CHEST 1 VIEW COMPARISON:  12/07/2019. FINDINGS: Mediastinum and hilar structures normal. Heart size normal. Mild right mid lung infiltrate cannot be excluded. No pleural effusion or pneumothorax. Heart size stable. No acute bony abnormality. IMPRESSION: Mild right mid lung infiltrate cannot be excluded. Electronically Signed   By: Maisie Fus  Register   On: 06/02/2020 09:23    Procedures Procedures (including critical care time)  Medications Ordered in ED Medications  0.9 %  sodium chloride infusion (has no administration in time range)  diphenhydrAMINE (BENADRYL) injection 50 mg (has no administration in time range)  famotidine (PEPCID) IVPB 20 mg premix (has no administration in time range)  methylPREDNISolone sodium succinate (SOLU-MEDROL) 125 mg/2 mL injection 125 mg (has no administration in time range)  albuterol (VENTOLIN HFA) 108 (90 Base) MCG/ACT inhaler 2 puff (has no administration in time range)  EPINEPHrine (EPI-PEN) injection 0.3 mg (has no administration in time range)  casirivimab-imdevimab (REGEN-COV) 1,200 mg in sodium chloride  0.9 % 110 mL IVPB (1,200 mg Intravenous New Bag/Given 06/02/20 1355)    ED Course  I have reviewed the triage vital signs and the nursing notes.  Pertinent labs & imaging results that were available during my care  of the patient were reviewed by me and considered in my medical decision making (see chart for details).  Patient seen and examined. Work-up initiated.  Patient looks well, in no distress.  Will check pulse ox with ambulation.  Patient does not present with symptoms consistent with severe pneumonia.  Will consider ACS, PE.  EKG reviewed without significant ischemic findings.  Vital signs reviewed and are as follows: BP (!) 155/92 (BP Location: Right Arm)    Pulse 69    Temp 98.7 F (37.1 C) (Oral)    Resp 18    Ht  (1.727 m)    Wt (!) 149.7 kg    SpO2 95%    BMI 50.18 kg/m   9:29 AM RN reports O2 sat 95% on RA with ambulation, steady gait.   12:54 PM COVID+. Pt rechecked and updated.  Continues to look well. Pulse ox 96 and heart rate of 65 on monitor.  Discussed with patient, risks and benefits of monoclonal antibodies. I feel he would be a candidate given elevated BMI greater than 30, history of asthma, on day eight of symptoms. Patient agrees to proceed.  Plan: Discharge home with CAP coverage (doxy--pen allergy), steroids (asthma) with close moniting and return with worsening. Patient agrees.   3:01 PM Pt rechecked. He has done well with Mab infusion. Plan for d/c at 3:30pm after obs period. Questions answered.   BP (!) 165/99 (BP Location: Right Arm)    Pulse (!) 58    Temp 98.2 F (36.8 C) (Oral)    Resp 15    Ht  (1.727 m)    Wt (!) 149.7 kg    SpO2 97%    BMI 50.18 kg/m   Roy [name] was evaluated in Emergency Department on 06/02/2020 for the symptoms described in the history of present illness. He was evaluated in the context of the global COVID-19 pandemic, which necessitated consideration that the patient might be at risk for infection with the SARS-CoV-2  virus that causes COVID-19. Institutional protocols and algorithms that pertain to the evaluation of patients at risk for COVID-19 are in a state of rapid change based on information released by regulatory bodies including the CDC and federal and state organizations. These policies and algorithms were followed during the patient's care in the ED.     MDM Rules/Calculators/A&P                          COVID-19 in setting of asthma, elevated BMI: Patient reports borderline oxygen saturations at home, however in the ED today normal at rest and with ambulation.  Chest x-ray with questionable right middle lobe infiltrate.  X-ray does not have classic clinical viral pneumonia.  Doubt PE.  Patient was treated with monoclonal antibodies in the ED.  Given possible consolidative pneumonia, will cover with doxycycline.  Given history of asthma and reported intermittent wheezing, will give 5-day burst of prednisone.  Patient will require close monitoring.  At this point he does not require oxygen and does not require admission to the hospital.  Patient counseled on signs symptoms to return.    Final Clinical Impression(s) / ED Diagnoses Final diagnoses:  COVID-19  Community acquired pneumonia of right middle lobe of lung    Rx / DC Orders ED Discharge Orders         Ordered    predniSONE (DELTASONE) 20 MG tablet  Daily        06/02/20 1438  doxycycline (VIBRAMYCIN) 100 MG capsule  2 times daily        06/02/20 1438           Renne Crigler, PA-C 06/02/20 1502    Raeford Razor, MD 06/03/20 1639

## 2020-06-02 NOTE — Discharge Instructions (Signed)
Please read and follow all provided instructions.  Your diagnoses today include:  1. COVID-19   2. SOB (shortness of breath)   3. Community acquired pneumonia of right middle lobe of lung     Tests performed today include:  Blood counts and electrolytes  Chest x-ray -- shows pneumonia  COVID test - is positive  Vital signs. See below for your results today.   Medications prescribed:   Doxycycline - antibiotic  You have been prescribed an antibiotic medicine: take the entire course of medicine even if you are feeling better. Stopping early can cause the antibiotic not to work.   Prednisone - steroid medicine   It is best to take this medication in the morning to prevent sleeping problems. If you are diabetic, monitor your blood sugar closely and stop taking Prednisone if blood sugar is over 300. Take with food to prevent stomach upset.   Take any prescribed medications only as directed.  Home care instructions:  Follow any educational materials contained in this packet.  Take the complete course of antibiotics that you were prescribed.   BE VERY CAREFUL not to take multiple medicines containing Tylenol (also called acetaminophen). Doing so can lead to an overdose which can damage your liver and cause liver failure and possibly death.   Follow-up instructions: Please follow-up with your primary care provider in the next 3 days for further evaluation of your symptoms and to ensure resolution of your infection.   Return instructions:   Please return to the Emergency Department if you experience worsening symptoms.   Return immediately with worsening breathing, worsening shortness of breath, or if you feel it is taking you more effort to breathe.   Please return if you have any other emergent concerns.  Additional Information:  Your vital signs today were: BP (!) 156/100    Pulse (!) 57    Temp 98.2 F (36.8 C) (Oral)    Resp 14    Ht 5\' 8"  (1.727 m)    Wt (!) 149.7 kg     SpO2 97%    BMI 50.18 kg/m  If your blood pressure (BP) was elevated above 135/85 this visit, please have this repeated by your doctor within one month. --------------

## 2020-06-07 ENCOUNTER — Ambulatory Visit (INDEPENDENT_AMBULATORY_CARE_PROVIDER_SITE_OTHER): Payer: Medicare HMO | Admitting: Family Medicine

## 2020-06-07 ENCOUNTER — Encounter: Payer: Self-pay | Admitting: Family Medicine

## 2020-06-07 DIAGNOSIS — F411 Generalized anxiety disorder: Secondary | ICD-10-CM | POA: Diagnosis not present

## 2020-06-07 DIAGNOSIS — M5416 Radiculopathy, lumbar region: Secondary | ICD-10-CM | POA: Diagnosis not present

## 2020-06-07 DIAGNOSIS — I10 Essential (primary) hypertension: Secondary | ICD-10-CM

## 2020-06-07 DIAGNOSIS — K219 Gastro-esophageal reflux disease without esophagitis: Secondary | ICD-10-CM

## 2020-06-07 DIAGNOSIS — U071 COVID-19: Secondary | ICD-10-CM

## 2020-06-07 MED ORDER — GABAPENTIN 300 MG PO CAPS: 300.0000 mg | ORAL_CAPSULE | Freq: Three times a day (TID) | ORAL | 1 refills | Status: DC

## 2020-06-07 MED ORDER — DULOXETINE HCL 60 MG PO CPEP: 120.0000 mg | ORAL_CAPSULE | Freq: Every day | ORAL | 1 refills | Status: DC

## 2020-06-07 MED ORDER — CARVEDILOL 6.25 MG PO TABS: 6.2500 mg | ORAL_TABLET | Freq: Two times a day (BID) | ORAL | 1 refills | Status: DC

## 2020-06-07 MED ORDER — ALPRAZOLAM 0.5 MG PO TABS: 0.5000 mg | ORAL_TABLET | Freq: Three times a day (TID) | ORAL | 0 refills | Status: DC | PRN

## 2020-06-07 MED ORDER — LINACLOTIDE 290 MCG PO CAPS
290.0000 ug | ORAL_CAPSULE | Freq: Every day | ORAL | 1 refills | Status: DC | PRN
Start: 2020-06-07 — End: 2022-06-05

## 2020-06-07 MED ORDER — DEXILANT 60 MG PO CPDR: 1.0000 | DELAYED_RELEASE_CAPSULE | Freq: Every day | ORAL | 1 refills | Status: DC

## 2020-06-07 MED ORDER — QUETIAPINE FUMARATE 50 MG PO TABS: ORAL_TABLET | ORAL | 1 refills | Status: DC

## 2020-06-07 MED ORDER — PREDNISONE 20 MG PO TABS
40.0000 mg | ORAL_TABLET | Freq: Every day | ORAL | 0 refills | Status: DC
Start: 2020-06-07 — End: 2020-06-28

## 2020-06-07 NOTE — Progress Notes (Signed)
Subjective:    Patient ID: Roy [name], male    DOB: 1971-02-12, 49 y.o.   MRN: [id number]   HPI: Roy [name] is a 49 y.o. male presenting for fever, body aches, loss of taste and smell. Had REGEN-COV tx on 9/10at the E.D. He was prescribed doxy  and prednisone. Still coughing with some green productivity. Not too much dyspnea. Some with exertion. Now resting at home since E.D. discharge.   Depression screen [facility] 2/9 12/07/2019 06/09/2019 07/20/2018 07/01/2018 02/19/2018  Decreased Interest 2 0 0 0 1  Down, Depressed, Hopeless 2 0 0 0 1  PHQ - 2 Score 4 0 0 0 2  Altered sleeping 1 - - - 0  Tired, decreased energy 2 - - - 2  Change in appetite 0 - - - -  Feeling bad or failure about yourself  0 - - - 1  Trouble concentrating 0 - - - 0  Moving slowly or fidgety/restless 0 - - - 0  Suicidal thoughts 0 - - - 0  PHQ-9 Score 7 - - - 5  Difficult doing work/chores Somewhat difficult - - - Somewhat difficult  Some recent data might be hidden     Relevant past medical, surgical, family and social history reviewed and updated as indicated.  Interim medical history since our last visit reviewed. Allergies and medications reviewed and updated.  ROS:  Review of Systems  Constitutional: Negative for fever.  Respiratory: Positive for cough and shortness of breath (mild, with exertion).   Cardiovascular: Negative for chest pain.  Musculoskeletal: Negative for arthralgias.  Skin: Negative for rash.     Social History   Tobacco Use  Smoking Status Former Smoker  . Packs/day: 0.50  . Years: 30.00  . Pack years: 15.00  . Types: Cigarettes  . Quit date: 07/23/2017  . Years since quitting: 2.8  Smokeless Tobacco Former Neurosurgeon  . Types: Snuff, Chew       Objective:     Wt Readings from Last 3 Encounters:  06/02/20 (!) 330 lb (149.7 kg)  12/07/19 (!) 355 lb 6.4 oz (161.2 kg)  12/02/19 (!) 357 lb 6.4 oz (162.1 kg)     Exam deferred. Pt. Harboring due to COVID 19. Phone visit  performed.   Assessment & Plan:   1. COVID-19 virus infection   2. GAD (generalized anxiety disorder)   3. Benign essential HTN   4. Gastroesophageal reflux disease, unspecified whether esophagitis present   5. Lumbar radiculopathy     Meds ordered this encounter  Medications  . predniSONE (DELTASONE) 20 MG tablet    Sig: Take 2 tablets (40 mg total) by mouth daily.    Dispense:  10 tablet    Refill:  0  . ALPRAZolam (XANAX) 0.5 MG tablet    Sig: Take 1 tablet (0.5 mg total) by mouth 3 (three) times daily as needed for anxiety.    Dispense:  90 tablet    Refill:  0  . carvedilol (COREG) 6.25 MG tablet    Sig: Take 1 tablet (6.25 mg total) by mouth 2 (two) times daily with a meal.    Dispense:  180 tablet    Refill:  1  . dexlansoprazole (DEXILANT) 60 MG capsule    Sig: Take 1 capsule (60 mg total) by mouth daily.    Dispense:  90 capsule    Refill:  1    Please consider 90 day supplies to promote better adherence  . DULoxetine (CYMBALTA)  60 MG capsule    Sig: Take 2 capsules (120 mg total) by mouth daily.    Dispense:  180 capsule    Refill:  1  . gabapentin (NEURONTIN) 300 MG capsule    Sig: Take 1 capsule (300 mg total) by mouth 3 (three) times daily.    Dispense:  270 capsule    Refill:  1  . linaclotide (LINZESS) 290 MCG CAPS capsule    Sig: Take 1 capsule (290 mcg total) by mouth daily as needed (constipation).    Dispense:  90 capsule    Refill:  1  . QUEtiapine (SEROQUEL) 50 MG tablet    Sig: TAKE 1 TABLET BY MOUTH AT BEDTIME FOR  NERVES    Dispense:  90 tablet    Refill:  1      Due to active CoVID 19 infection, I renewed all of his meds and gave a one month only supply of alprazolam to prevent withdrawal. He will need to follow up after the infection for further refills/ checkup   Diagnoses and all orders for this visit:  COVID-19 virus infection  GAD (generalized anxiety disorder) -     ALPRAZolam (XANAX) 0.5 MG tablet; Take 1 tablet (0.5 mg total) by  mouth 3 (three) times daily as needed for anxiety. -     DULoxetine (CYMBALTA) 60 MG capsule; Take 2 capsules (120 mg total) by mouth daily. -     QUEtiapine (SEROQUEL) 50 MG tablet; TAKE 1 TABLET BY MOUTH AT BEDTIME FOR  NERVES  Benign essential HTN -     carvedilol (COREG) 6.25 MG tablet; Take 1 tablet (6.25 mg total) by mouth 2 (two) times daily with a meal.  Gastroesophageal reflux disease, unspecified whether esophagitis present -     dexlansoprazole (DEXILANT) 60 MG capsule; Take 1 capsule (60 mg total) by mouth daily.  Lumbar radiculopathy -     DULoxetine (CYMBALTA) 60 MG capsule; Take 2 capsules (120 mg total) by mouth daily. -     gabapentin (NEURONTIN) 300 MG capsule; Take 1 capsule (300 mg total) by mouth 3 (three) times daily.  Other orders -     predniSONE (DELTASONE) 20 MG tablet; Take 2 tablets (40 mg total) by mouth daily. -     linaclotide (LINZESS) 290 MCG CAPS capsule; Take 1 capsule (290 mcg total) by mouth daily as needed (constipation).    Virtual Visit via telephone Note  I discussed the limitations, risks, security and privacy concerns of performing an evaluation and management service by telephone and the availability of in person appointments. The patient was identified with two identifiers. Pt.expressed understanding and agreed to proceed. Pt. Is at home. Dr. Darlyn Read is in his office.  Follow Up Instructions:   I discussed the assessment and treatment plan with the patient. The patient was provided an opportunity to ask questions and all were answered. The patient agreed with the plan and demonstrated an understanding of the instructions.   The patient was advised to call back or seek an in-person evaluation if the symptoms worsen or if the condition fails to improve as anticipated.   Total minutes including chart review and phone contact time: 17   Follow up plan: No follow-ups on file.  Mechele Claude, MD [place]

## 2020-06-08 ENCOUNTER — Ambulatory Visit: Payer: Medicare HMO | Admitting: Family Medicine

## 2020-06-09 ENCOUNTER — Other Ambulatory Visit: Payer: Self-pay | Admitting: Family Medicine

## 2020-06-09 DIAGNOSIS — F411 Generalized anxiety disorder: Secondary | ICD-10-CM

## 2020-06-28 ENCOUNTER — Encounter: Payer: Self-pay | Admitting: Family Medicine

## 2020-06-28 ENCOUNTER — Ambulatory Visit (INDEPENDENT_AMBULATORY_CARE_PROVIDER_SITE_OTHER): Payer: Medicare HMO | Admitting: Family Medicine

## 2020-06-28 ENCOUNTER — Ambulatory Visit (INDEPENDENT_AMBULATORY_CARE_PROVIDER_SITE_OTHER): Payer: Medicare HMO

## 2020-06-28 ENCOUNTER — Other Ambulatory Visit: Payer: Self-pay

## 2020-06-28 VITALS — BP 139/87 | HR 77 | Temp 96.8°F | Resp 20 | Ht 68.0 in | Wt 352.0 lb

## 2020-06-28 DIAGNOSIS — U071 COVID-19: Secondary | ICD-10-CM

## 2020-06-28 DIAGNOSIS — R0600 Dyspnea, unspecified: Secondary | ICD-10-CM | POA: Diagnosis not present

## 2020-06-28 DIAGNOSIS — R059 Cough, unspecified: Secondary | ICD-10-CM | POA: Diagnosis not present

## 2020-06-28 DIAGNOSIS — J4521 Mild intermittent asthma with (acute) exacerbation: Secondary | ICD-10-CM

## 2020-06-28 DIAGNOSIS — J40 Bronchitis, not specified as acute or chronic: Secondary | ICD-10-CM | POA: Diagnosis not present

## 2020-06-28 MED ORDER — ALBUTEROL SULFATE HFA 108 (90 BASE) MCG/ACT IN AERS: 1.0000 | INHALATION_SPRAY | Freq: Four times a day (QID) | RESPIRATORY_TRACT | 2 refills | Status: DC | PRN

## 2020-06-28 MED ORDER — AZITHROMYCIN 250 MG PO TABS: ORAL_TABLET | ORAL | 0 refills | Status: DC

## 2020-06-28 MED ORDER — PREDNISONE 10 MG PO TABS: ORAL_TABLET | ORAL | 0 refills | Status: DC

## 2020-06-28 NOTE — Progress Notes (Signed)
Subjective:  Patient ID: Roy [name], male    DOB: 1971-05-14  Age: 49 y.o. MRN: [id number]  CC: 2 Week Follow-up -6 days   HPI Roy [name] presents for continued chest tightness. HE also is getting dyspneic and having sweats. He had sputum producing cough that statred improving 5-6 days ago.   Depression screen [facility] 2/9 06/28/2020 12/07/2019 06/09/2019  Decreased Interest 0 2 0  Down, Depressed, Hopeless 1 2 0  PHQ - 2 Score 1 4 0  Altered sleeping - 1 -  Tired, decreased energy - 2 -  Change in appetite - 0 -  Feeling bad or failure about yourself  - 0 -  Trouble concentrating - 0 -  Moving slowly or fidgety/restless - 0 -  Suicidal thoughts - 0 -  PHQ-9 Score - 7 -  Difficult doing work/chores - Somewhat difficult -  Some recent data might be hidden    History Roy [name] has a past medical history of Anxiety, Depression, Hypertension, Low testosterone, Median nerve dysfunction, Radiculopathy of cervical region, Thyroid disease, Ulnar neuropathy at elbow, and Weakness.   He has a past surgical history that includes Rotator cuff repair (Right); Biceps tendon repair; and Neck surgery.   His family history includes Arrhythmia in his father; Asthma in his brother and daughter; Bipolar disorder in his brother; COPD in his father; Fibromyalgia in his sister; Hypertension in his mother; Stroke (age of onset: 26) in his mother.He reports that he quit smoking about 2 years ago. His smoking use included cigarettes. He has a 15.00 pack-year smoking history. He has quit using smokeless tobacco.  His smokeless tobacco use included snuff and chew. He reports that he does not drink alcohol and does not use drugs.    ROS Review of Systems  Constitutional: Negative for fever.  Respiratory: Negative for shortness of breath.   Cardiovascular: Negative for chest pain.  Musculoskeletal: Negative for arthralgias.  Skin: Negative for rash.    Objective:  BP 139/87   Pulse 77   Temp (!)  96.8 F (36 C) (Temporal)   Resp 20   Ht 5\' 8"  (1.727 m)   Wt (!) 352 lb (159.7 kg)   SpO2 95%   BMI 53.52 kg/m   BP Readings from Last 3 Encounters:  06/28/20 139/87  06/02/20 (!) 156/105  12/07/19 137/89    Wt Readings from Last 3 Encounters:  06/28/20 (!) 352 lb (159.7 kg)  06/02/20 (!) 330 lb (149.7 kg)  12/07/19 (!) 355 lb 6.4 oz (161.2 kg)     Physical Exam Vitals reviewed.  Constitutional:      Appearance: He is well-developed.  HENT:     Head: Normocephalic and atraumatic.     Right Ear: External ear normal.     Left Ear: External ear normal.     Mouth/Throat:     Pharynx: No oropharyngeal exudate or posterior oropharyngeal erythema.  Eyes:     Pupils: Pupils are equal, round, and reactive to light.  Cardiovascular:     Rate and Rhythm: Normal rate and regular rhythm.     Heart sounds: No murmur heard.   Pulmonary:     Effort: No respiratory distress.     Breath sounds: Normal breath sounds.  Musculoskeletal:     Cervical back: Normal range of motion and neck supple.  Neurological:     Mental Status: He is alert and oriented to person, place, and time.       Assessment & Plan:  Roy [name] was seen today for 2 week follow-up.  Diagnoses and all orders for this visit:  Mild intermittent asthma with acute exacerbation -     DG Chest 2 View; Future  COVID-19 virus infection -     DG Chest 2 View; Future  Bronchitis -     albuterol (VENTOLIN HFA) 108 (90 Base) MCG/ACT inhaler; Inhale 1-2 puffs into the lungs every 6 (six) hours as needed for wheezing or shortness of breath.  Other orders -     azithromycin (ZITHROMAX Z-PAK) 250 MG tablet; Take two right away Then one a day for the next 4 days. -     predniSONE (DELTASONE) 10 MG tablet; Take 5 daily for 3 days followed by 4,3,2 and 1 for 3 days each.       I have discontinued Roy [name]'s doxycycline and predniSONE. I am also having him start on azithromycin and predniSONE. Additionally,  I am having him maintain his acetaminophen, traZODone, sildenafil, levothyroxine, celecoxib, budesonide-formoterol, ALPRAZolam, carvedilol, Dexilant, DULoxetine, gabapentin, linaclotide, QUEtiapine, and albuterol.  Allergies as of 06/28/2020      Reactions   Penicillins Rash, Other (See Comments)   Has patient had a PCN reaction causing immediate rash, facial/tongue/throat swelling, SOB or lightheadedness with hypotension: Yes Has patient had a PCN reaction causing severe rash involving mucus membranes or skin necrosis: No Has patient had a PCN reaction that required hospitalization: No Has patient had a PCN reaction occurring within the last 10 years: No If all of the above answers are "NO", then may proceed with Cephalosporin use. Has patient had a PCN reaction causing immediate rash, facial/tongue/throat swelling, SOB or lightheadedness with hypotension: Yes Has patient had a PCN reaction causing severe rash involving mucus membranes or skin necrosis: No Has patient had a PCN reaction that required hospitalization: No Has patient had a PCN reaction occurring within the last 10 years: No If all of the above answers are "NO", then may proceed with Cephalosporin use. unknown   Chantix [varenicline] Other (See Comments)   Severe headache      Medication List       Accurate as of June 28, 2020 11:59 PM. If you have any questions, ask your nurse or doctor.        STOP taking these medications   doxycycline 100 MG capsule Commonly known as: VIBRAMYCIN Stopped by: Mechele Claude, MD     TAKE these medications   acetaminophen 500 MG tablet Commonly known as: TYLENOL Take 1,000 mg by mouth every 6 (six) hours as needed for headache (pain).   albuterol 108 (90 Base) MCG/ACT inhaler Commonly known as: VENTOLIN HFA Inhale 1-2 puffs into the lungs every 6 (six) hours as needed for wheezing or shortness of breath.   ALPRAZolam 0.5 MG tablet Commonly known as: XANAX Take 1 tablet (0.5  mg total) by mouth 3 (three) times daily as needed for anxiety.   azithromycin 250 MG tablet Commonly known as: Zithromax Z-Pak Take two right away Then one a day for the next 4 days. Started by: Mechele Claude, MD   budesonide-formoterol 160-4.5 MCG/ACT inhaler Commonly known as: Symbicort INHALE TWO PUFFS BY MOUTH TWICE DAILY FOR COPD/ASTHMA What changed:   how much to take  how to take this  when to take this  additional instructions   carvedilol 6.25 MG tablet Commonly known as: COREG Take 1 tablet (6.25 mg total) by mouth 2 (two) times daily with a meal.   celecoxib 200 MG capsule Commonly known  as: CeleBREX Take 1 capsule (200 mg total) by mouth daily. For arthritis   Dexilant 60 MG capsule Generic drug: dexlansoprazole Take 1 capsule (60 mg total) by mouth daily.   DULoxetine 60 MG capsule Commonly known as: CYMBALTA Take 2 capsules (120 mg total) by mouth daily.   gabapentin 300 MG capsule Commonly known as: NEURONTIN Take 1 capsule (300 mg total) by mouth 3 (three) times daily.   levothyroxine 50 MCG tablet Commonly known as: SYNTHROID Take 1 tablet (50 mcg total) by mouth daily.   linaclotide 290 MCG Caps capsule Commonly known as: LINZESS Take 1 capsule (290 mcg total) by mouth daily as needed (constipation).   predniSONE 10 MG tablet Commonly known as: DELTASONE Take 5 daily for 3 days followed by 4,3,2 and 1 for 3 days each. What changed:   medication strength  how much to take  how to take this  when to take this  additional instructions Changed by: Mechele Claude, MD   QUEtiapine 50 MG tablet Commonly known as: SEROQUEL TAKE 1 TABLET BY MOUTH AT BEDTIME FOR  NERVES   sildenafil 20 MG tablet Commonly known as: REVATIO Take 2-5 tablets daily as needed for sex   traZODone 100 MG tablet Commonly known as: DESYREL Take 1 tablet (100 mg total) by mouth at bedtime.        Follow-up: No follow-ups on file.  Mechele Claude, M.D.

## 2020-07-01 NOTE — Progress Notes (Signed)
Your chest x-ray looked normal. Thanks, WS.

## 2020-07-02 ENCOUNTER — Encounter: Payer: Self-pay | Admitting: Family Medicine

## 2020-07-10 ENCOUNTER — Other Ambulatory Visit: Payer: Self-pay | Admitting: Family Medicine

## 2020-07-10 DIAGNOSIS — F411 Generalized anxiety disorder: Secondary | ICD-10-CM

## 2020-07-11 ENCOUNTER — Telehealth: Payer: Self-pay | Admitting: Family Medicine

## 2020-07-11 ENCOUNTER — Other Ambulatory Visit: Payer: Self-pay | Admitting: Family Medicine

## 2020-07-11 DIAGNOSIS — F411 Generalized anxiety disorder: Secondary | ICD-10-CM

## 2020-07-11 NOTE — Telephone Encounter (Signed)
Lmtcb   Per chart medication was sent to [facility] pharmacy - below is conformation receipt. Prescribing Status:  Receipt confirmed by pharmacy (07/10/2020  9:03 PM ED  Please have patient contact pharmacy

## 2020-07-11 NOTE — Telephone Encounter (Signed)
Pt said that his prescription was not at the pharmacy, had an appt on Oct 6th for med refill for alprazolam

## 2020-07-14 ENCOUNTER — Encounter: Payer: Self-pay | Admitting: *Deleted

## 2020-07-31 ENCOUNTER — Ambulatory Visit (INDEPENDENT_AMBULATORY_CARE_PROVIDER_SITE_OTHER): Payer: Medicare HMO | Admitting: Family Medicine

## 2020-07-31 ENCOUNTER — Encounter: Payer: Self-pay | Admitting: Family Medicine

## 2020-07-31 ENCOUNTER — Other Ambulatory Visit: Payer: Self-pay

## 2020-07-31 VITALS — BP 128/85 | HR 68 | Temp 97.8°F | Ht 68.0 in | Wt 357.4 lb

## 2020-07-31 DIAGNOSIS — F411 Generalized anxiety disorder: Secondary | ICD-10-CM

## 2020-07-31 MED ORDER — QUETIAPINE FUMARATE 50 MG PO TABS: ORAL_TABLET | ORAL | 1 refills | Status: DC

## 2020-07-31 NOTE — Progress Notes (Signed)
Subjective:  Patient ID: Roy [name], male    DOB: 06/01/1971  Age: 49 y.o. MRN: [id number]  CC: Anxiety and Depression   HPI Roy [name] presents for onset of acute anxiety reaction from the death of his mother.  She passed away yesterday.  There is already some family in fighting about the will.  Roy [name] situation actually started on November 4 when he went to his mother's bedroom as she lives with him, and found her disheveled wide-eyed and unresponsive.  He found a pulse and called 911.  She was admitted to the hospital with multiple strokes and put on a ventilator.  He and his sisters had to decide to take her off of the ventilator yesterday.  Apparently situation he found her and was fairly graphic.  He was unable to freely describe it although we attempted to do so.  He said that in the hospital she just looked like she was sleeping as opposed to how bad she looked when he first found her.  This is caused the patient to be quite anxious and unable to sleep.  He has been out of Seroquel for several weeks apparently needs something for anxiety.  He still takes the Xanax 3 times a day.  That just is not helping.  In particular cannot sleep.  Depression screen [facility] 2/9 07/31/2020 06/28/2020 12/07/2019  Decreased Interest 2 0 2  Down, Depressed, Hopeless 1 1 2   PHQ - 2 Score 3 1 4   Altered sleeping 3 - 1  Tired, decreased energy 2 - 2  Change in appetite 3 - 0  Feeling bad or failure about yourself  0 - 0  Trouble concentrating 0 - 0  Moving slowly or fidgety/restless 0 - 0  Suicidal thoughts 0 - 0  PHQ-9 Score 11 - 7  Difficult doing work/chores Somewhat difficult - Somewhat difficult  Some recent data might be hidden    History Bradey has a past medical history of Anxiety, Depression, Hypertension, Low testosterone, Median nerve dysfunction, Radiculopathy of cervical region, Thyroid disease, Ulnar neuropathy at elbow, and Weakness.   He has a past surgical history that  includes Rotator cuff repair (Right); Biceps tendon repair; and Neck surgery.   His family history includes Arrhythmia in his father; Asthma in his brother and daughter; Bipolar disorder in his brother; COPD in his father; Fibromyalgia in his sister; Hypertension in his mother; Stroke (age of onset: 10) in his mother.He reports that he quit smoking about 3 years ago. His smoking use included cigarettes. He has a 15.00 pack-year smoking history. He has quit using smokeless tobacco.  His smokeless tobacco use included snuff and chew. He reports that he does not drink alcohol and does not use drugs.    ROS Review of Systems  Constitutional: Negative for fever.  Respiratory: Negative for shortness of breath.   Cardiovascular: Negative for chest pain.  Musculoskeletal: Negative for arthralgias.  Skin: Negative for rash.    Objective:  BP 128/85    Pulse 68    Temp 97.8 F (36.6 C) (Temporal)    Ht 5\' 8"  (1.727 m)    Wt (!) 357 lb 6.4 oz (162.1 kg)    BMI 54.34 kg/m   BP Readings from Last 3 Encounters:  07/31/20 128/85  06/28/20 139/87  06/02/20 (!) 156/105    Wt Readings from Last 3 Encounters:  07/31/20 (!) 357 lb 6.4 oz (162.1 kg)  06/28/20 (!) 352 lb (159.7 kg)  06/02/20 (!) 330  lb (149.7 kg)     Physical Exam Vitals reviewed.  Constitutional:      Appearance: He is well-developed.  HENT:     Head: Normocephalic and atraumatic.     Right Ear: External ear normal.     Left Ear: External ear normal.     Mouth/Throat:     Pharynx: No oropharyngeal exudate or posterior oropharyngeal erythema.  Eyes:     Pupils: Pupils are equal, round, and reactive to light.  Cardiovascular:     Rate and Rhythm: Normal rate and regular rhythm.     Heart sounds: No murmur heard.   Pulmonary:     Effort: No respiratory distress.     Breath sounds: Normal breath sounds.  Musculoskeletal:     Cervical back: Normal range of motion and neck supple.  Neurological:     Mental Status: He is  alert and oriented to person, place, and time.       Assessment & Plan:   Jaelon was seen today for anxiety and depression.  Diagnoses and all orders for this visit:  Anxiety reaction  GAD (generalized anxiety disorder) -     QUEtiapine (SEROQUEL) 50 MG tablet; TAKE 1 TABLET BY MOUTH AT BEDTIME FOR  NERVES       I have discontinued Bertram Millard. Sugarman's azithromycin and predniSONE. I am also having him maintain his acetaminophen, traZODone, sildenafil, levothyroxine, celecoxib, budesonide-formoterol, carvedilol, Dexilant, DULoxetine, gabapentin, linaclotide, albuterol, ALPRAZolam, and QUEtiapine.  Allergies as of 07/31/2020      Reactions   Penicillins Rash, Other (See Comments)   Has patient had a PCN reaction causing immediate rash, facial/tongue/throat swelling, SOB or lightheadedness with hypotension: Yes Has patient had a PCN reaction causing severe rash involving mucus membranes or skin necrosis: No Has patient had a PCN reaction that required hospitalization: No Has patient had a PCN reaction occurring within the last 10 years: No If all of the above answers are "NO", then may proceed with Cephalosporin use. Has patient had a PCN reaction causing immediate rash, facial/tongue/throat swelling, SOB or lightheadedness with hypotension: Yes Has patient had a PCN reaction causing severe rash involving mucus membranes or skin necrosis: No Has patient had a PCN reaction that required hospitalization: No Has patient had a PCN reaction occurring within the last 10 years: No If all of the above answers are "NO", then may proceed with Cephalosporin use. unknown   Chantix [varenicline] Other (See Comments)   Severe headache      Medication List       Accurate as of July 31, 2020 11:32 AM. If you have any questions, ask your nurse or doctor.        STOP taking these medications   azithromycin 250 MG tablet Commonly known as: Zithromax Z-Pak Stopped by: Mechele Claude,  MD   predniSONE 10 MG tablet Commonly known as: DELTASONE Stopped by: Mechele Claude, MD     TAKE these medications   acetaminophen 500 MG tablet Commonly known as: TYLENOL Take 1,000 mg by mouth every 6 (six) hours as needed for headache (pain).   albuterol 108 (90 Base) MCG/ACT inhaler Commonly known as: VENTOLIN HFA Inhale 1-2 puffs into the lungs every 6 (six) hours as needed for wheezing or shortness of breath.   ALPRAZolam 0.5 MG tablet Commonly known as: XANAX TAKE 1 TABLET BY MOUTH THREE TIMES DAILY AS NEEDED FOR ANXIETY   budesonide-formoterol 160-4.5 MCG/ACT inhaler Commonly known as: Symbicort INHALE TWO PUFFS BY MOUTH TWICE DAILY FOR COPD/ASTHMA  What changed:   how much to take  how to take this  when to take this  additional instructions   carvedilol 6.25 MG tablet Commonly known as: COREG Take 1 tablet (6.25 mg total) by mouth 2 (two) times daily with a meal.   celecoxib 200 MG capsule Commonly known as: CeleBREX Take 1 capsule (200 mg total) by mouth daily. For arthritis   Dexilant 60 MG capsule Generic drug: dexlansoprazole Take 1 capsule (60 mg total) by mouth daily.   DULoxetine 60 MG capsule Commonly known as: CYMBALTA Take 2 capsules (120 mg total) by mouth daily.   gabapentin 300 MG capsule Commonly known as: NEURONTIN Take 1 capsule (300 mg total) by mouth 3 (three) times daily.   levothyroxine 50 MCG tablet Commonly known as: SYNTHROID Take 1 tablet (50 mcg total) by mouth daily.   linaclotide 290 MCG Caps capsule Commonly known as: LINZESS Take 1 capsule (290 mcg total) by mouth daily as needed (constipation).   QUEtiapine 50 MG tablet Commonly known as: SEROQUEL TAKE 1 TABLET BY MOUTH AT BEDTIME FOR  NERVES   sildenafil 20 MG tablet Commonly known as: REVATIO Take 2-5 tablets daily as needed for sex   traZODone 100 MG tablet Commonly known as: DESYREL Take 1 tablet (100 mg total) by mouth at bedtime.         Follow-up: No follow-ups on file.  Mechele Claude, M.D.

## 2020-08-02 ENCOUNTER — Ambulatory Visit: Payer: Medicare HMO | Admitting: Family Medicine

## 2020-08-08 ENCOUNTER — Other Ambulatory Visit: Payer: Self-pay | Admitting: Family Medicine

## 2020-08-08 DIAGNOSIS — F411 Generalized anxiety disorder: Secondary | ICD-10-CM

## 2020-08-09 ENCOUNTER — Telehealth: Payer: Self-pay

## 2020-08-09 ENCOUNTER — Other Ambulatory Visit: Payer: Self-pay | Admitting: Family Medicine

## 2020-08-09 DIAGNOSIS — F411 Generalized anxiety disorder: Secondary | ICD-10-CM

## 2020-08-09 MED ORDER — ALPRAZOLAM 0.5 MG PO TABS: 0.5000 mg | ORAL_TABLET | Freq: Three times a day (TID) | ORAL | 4 refills | Status: DC | PRN

## 2020-08-09 NOTE — Telephone Encounter (Signed)
Called patient, no answer, left detailed voice mail 

## 2020-08-09 NOTE — Telephone Encounter (Signed)
Looks like I sent it in with no refills. I intended to give 6 mos supply. Sorry for the error. I just sent in for 5 more months. Should be available now.

## 2020-08-22 ENCOUNTER — Encounter: Payer: Self-pay | Admitting: Family Medicine

## 2020-08-22 ENCOUNTER — Other Ambulatory Visit: Payer: Self-pay

## 2020-08-22 ENCOUNTER — Ambulatory Visit (INDEPENDENT_AMBULATORY_CARE_PROVIDER_SITE_OTHER): Payer: Medicare HMO | Admitting: Family Medicine

## 2020-08-22 VITALS — BP 130/85 | HR 63 | Temp 97.8°F | Ht 68.0 in | Wt 353.8 lb

## 2020-08-22 DIAGNOSIS — F411 Generalized anxiety disorder: Secondary | ICD-10-CM | POA: Diagnosis not present

## 2020-08-22 DIAGNOSIS — J00 Acute nasopharyngitis [common cold]: Secondary | ICD-10-CM

## 2020-08-22 MED ORDER — FEXOFENADINE-PSEUDOEPHED ER 180-240 MG PO TB24: 1.0000 | ORAL_TABLET | Freq: Every evening | ORAL | 11 refills | Status: DC

## 2020-08-22 NOTE — Progress Notes (Signed)
Subjective:  Patient ID: Roy [name], male    DOB: 1971-02-02  Age: 49 y.o. MRN: [id number]  CC: Follow-up   HPI Roy [name] presents for anxiety better. Funeral followed by Thanksgiving went better than expected. No hangover in AM from the quetiapine. Tolerating it well at currrent dose  GAD 7 : Generalized Anxiety Score 08/22/2020 07/31/2020 06/09/2019 06/26/2016  Nervous, Anxious, on Edge 1 2 0 1  Control/stop worrying 1 3 0 0  Worry too much - different things 2 3 0 3  Trouble relaxing 2 3 1  0  Restless 0 1 0 0  Easily annoyed or irritable 1 2 0 1  Afraid - awful might happen 2 3 0 1  Total GAD 7 Score 9 17 1 6   Anxiety Difficulty Somewhat difficult Somewhat difficult Not difficult at all Not difficult at all      Depression screen [facility] 2/9 08/22/2020 07/31/2020 06/28/2020  Decreased Interest 1 2 0  Down, Depressed, Hopeless 1 1 1   PHQ - 2 Score 2 3 1   Altered sleeping 0 3 -  Tired, decreased energy 1 2 -  Change in appetite 2 3 -  Feeling bad or failure about yourself  0 0 -  Trouble concentrating 0 0 -  Moving slowly or fidgety/restless 0 0 -  Suicidal thoughts 0 0 -  PHQ-9 Score 5 11 -  Difficult doing work/chores Somewhat difficult Somewhat difficult -  Some recent data might be hidden    History Roy [name] has a past medical history of Anxiety, Depression, Hypertension, Low testosterone, Median nerve dysfunction, Radiculopathy of cervical region, Thyroid disease, Ulnar neuropathy at elbow, and Weakness.   Roy [name] has a past surgical history that includes Rotator cuff repair (Right); Biceps tendon repair; and Neck surgery.   His family history includes Arrhythmia in his father; Asthma in his brother and daughter; Bipolar disorder in his brother; COPD in his father; Fibromyalgia in his sister; Hypertension in his mother; Stroke (age of onset: 39) in his mother.Roy [name] reports that Roy [name] quit smoking about 3 years ago. His smoking use included cigarettes. Roy [name] has a 15.00 pack-year  smoking history. Roy [name] has quit using smokeless tobacco.  His smokeless tobacco use included snuff and chew. Roy [name] reports that Roy [name] does not drink alcohol and does not use drugs.    ROS Review of Systems  HENT: Positive for congestion, postnasal drip (clear) and rhinorrhea (clear).     Objective:  BP 130/85   Pulse 63   Temp 97.8 F (36.6 C) (Temporal)   Ht 5\' 8"  (1.727 m)   Wt (!) 353 lb 12.8 oz (160.5 kg)   BMI 53.80 kg/m   BP Readings from Last 3 Encounters:  08/22/20 130/85  07/31/20 128/85  06/28/20 139/87    Wt Readings from Last 3 Encounters:  08/22/20 (!) 353 lb 12.8 oz (160.5 kg)  07/31/20 (!) 357 lb 6.4 oz (162.1 kg)  06/28/20 (!) 352 lb (159.7 kg)     Physical Exam Vitals reviewed.  Constitutional:      Appearance: Roy [name] is well-developed.  HENT:     Head: Normocephalic and atraumatic.     Right Ear: Tympanic membrane and external ear normal.     Left Ear: Tympanic membrane and external ear normal.     Nose: Congestion present. No rhinorrhea.     Mouth/Throat:     Mouth: Mucous membranes are moist.     Pharynx: No oropharyngeal exudate or posterior oropharyngeal erythema.  Eyes:  Pupils: Pupils are equal, round, and reactive to light.  Cardiovascular:     Rate and Rhythm: Normal rate and regular rhythm.     Heart sounds: No murmur heard.   Pulmonary:     Effort: No respiratory distress.     Breath sounds: Normal breath sounds.  Musculoskeletal:     Cervical back: Normal range of motion and neck supple.  Neurological:     Mental Status: Roy [name] is alert and oriented to person, place, and time.       Assessment & Plan:   Roy [name] was seen today for follow-up.  Diagnoses and all orders for this visit:  GAD (generalized anxiety disorder)  Acute nasopharyngitis  Other orders -     fexofenadine-pseudoephedrine (ALLEGRA-D 24) 180-240 MG 24 hr tablet; Take 1 tablet by mouth every evening. For allergy and congestion       I am having Roy [name].  Roy [name] start on fexofenadine-pseudoephedrine. I am also having him maintain his acetaminophen, traZODone, sildenafil, levothyroxine, celecoxib, budesonide-formoterol, carvedilol, Dexilant, DULoxetine, gabapentin, linaclotide, albuterol, QUEtiapine, ALPRAZolam, and gemfibrozil.  Allergies as of 08/22/2020      Reactions   Penicillins Rash, Other (See Comments)   Has patient had a PCN reaction causing immediate rash, facial/tongue/throat swelling, SOB or lightheadedness with hypotension: Yes Has patient had a PCN reaction causing severe rash involving mucus membranes or skin necrosis: No Has patient had a PCN reaction that required hospitalization: No Has patient had a PCN reaction occurring within the last 10 years: No If all of the above answers are "NO", then may proceed with Cephalosporin use. Has patient had a PCN reaction causing immediate rash, facial/tongue/throat swelling, SOB or lightheadedness with hypotension: Yes Has patient had a PCN reaction causing severe rash involving mucus membranes or skin necrosis: No Has patient had a PCN reaction that required hospitalization: No Has patient had a PCN reaction occurring within the last 10 years: No If all of the above answers are "NO", then may proceed with Cephalosporin use. unknown   Chantix [varenicline] Other (See Comments)   Severe headache      Medication List       Accurate as of August 22, 2020 11:59 PM. If you have any questions, ask your nurse or doctor.        acetaminophen 500 MG tablet Commonly known as: TYLENOL Take 1,000 mg by mouth every 6 (six) hours as needed for headache (pain).   albuterol 108 (90 Base) MCG/ACT inhaler Commonly known as: VENTOLIN HFA Inhale 1-2 puffs into the lungs every 6 (six) hours as needed for wheezing or shortness of breath.   ALPRAZolam 0.5 MG tablet Commonly known as: XANAX Take 1 tablet (0.5 mg total) by mouth 3 (three) times daily as needed. for anxiety   budesonide-formoterol  160-4.5 MCG/ACT inhaler Commonly known as: Symbicort INHALE TWO PUFFS BY MOUTH TWICE DAILY FOR COPD/ASTHMA What changed:   how much to take  how to take this  when to take this  additional instructions   carvedilol 6.25 MG tablet Commonly known as: COREG Take 1 tablet (6.25 mg total) by mouth 2 (two) times daily with a meal.   celecoxib 200 MG capsule Commonly known as: CeleBREX Take 1 capsule (200 mg total) by mouth daily. For arthritis   Dexilant 60 MG capsule Generic drug: dexlansoprazole Take 1 capsule (60 mg total) by mouth daily.   DULoxetine 60 MG capsule Commonly known as: CYMBALTA Take 2 capsules (120 mg total) by mouth daily.   fexofenadine-pseudoephedrine  180-240 MG 24 hr tablet Commonly known as: ALLEGRA-D 24 Take 1 tablet by mouth every evening. For allergy and congestion Started by: Mechele Claude, MD   gabapentin 300 MG capsule Commonly known as: NEURONTIN Take 1 capsule (300 mg total) by mouth 3 (three) times daily.   gemfibrozil 600 MG tablet Commonly known as: LOPID   levothyroxine 50 MCG tablet Commonly known as: SYNTHROID Take 1 tablet (50 mcg total) by mouth daily.   linaclotide 290 MCG Caps capsule Commonly known as: LINZESS Take 1 capsule (290 mcg total) by mouth daily as needed (constipation).   QUEtiapine 50 MG tablet Commonly known as: SEROQUEL TAKE 1 TABLET BY MOUTH AT BEDTIME FOR  NERVES   sildenafil 20 MG tablet Commonly known as: REVATIO Take 2-5 tablets daily as needed for sex   traZODone 100 MG tablet Commonly known as: DESYREL Take 1 tablet (100 mg total) by mouth at bedtime.        Follow-up: Return in about 6 weeks (around 10/03/2020).  Mechele Claude, M.D.

## 2020-08-28 ENCOUNTER — Encounter: Payer: Self-pay | Admitting: Family Medicine

## 2020-09-23 ENCOUNTER — Other Ambulatory Visit: Payer: Self-pay | Admitting: Family Medicine

## 2020-10-03 ENCOUNTER — Encounter: Payer: Self-pay | Admitting: Family Medicine

## 2020-10-03 ENCOUNTER — Ambulatory Visit (INDEPENDENT_AMBULATORY_CARE_PROVIDER_SITE_OTHER): Payer: Medicare HMO | Admitting: Family Medicine

## 2020-10-03 ENCOUNTER — Other Ambulatory Visit: Payer: Self-pay

## 2020-10-03 VITALS — BP 132/82 | HR 66 | Temp 97.1°F | Ht 68.0 in | Wt 351.4 lb

## 2020-10-03 DIAGNOSIS — R079 Chest pain, unspecified: Secondary | ICD-10-CM

## 2020-10-03 DIAGNOSIS — F411 Generalized anxiety disorder: Secondary | ICD-10-CM | POA: Diagnosis not present

## 2020-10-03 MED ORDER — QUETIAPINE FUMARATE 100 MG PO TABS: ORAL_TABLET | ORAL | 1 refills | Status: DC

## 2020-10-03 NOTE — Progress Notes (Signed)
Subjective:  Patient ID: Roy [name], male    DOB: 07/01/1971  Age: 50 y.o. MRN: [id number]  CC: Medical Management of Chronic Issues (6 week/)   HPI Roy [name] presents for continued concerns about his anxiety and depression.  His brother is living with him now.  He is using his groceries and not helping with the grocery shopping.  He does pay for his own food but not for the food Roy [name] that he is eating.  However, the main concern is that this causes Roy [name] to have to go out to the store more often and he is anxious about going in [place] because there are people and they do not wear their mask.  Roy [name] also mentions that 5 days ago he started having chest pain and shortness of breath.  This is intermittent.  It is lasting a few minutes at a time.  It is substernal and left-sided.  Yesterday in the shower he had an episode of sticking and stinging in the chest centrally and slightly to the left during his shower.  This lasted 15 to 20 minutes.  There was no shortness of breath associated with this episode.   GAD 7 : Generalized Anxiety Score 10/03/2020 08/22/2020 07/31/2020 06/09/2019  Nervous, Anxious, on Edge 1 1 2  0  Control/stop worrying 0 1 3 0  Worry too much - different things 1 2 3  0  Trouble relaxing 0 2 3 1   Restless 0 0 1 0  Easily annoyed or irritable 1 1 2  0  Afraid - awful might happen 1 2 3  0  Total GAD 7 Score 4 9 17 1   Anxiety Difficulty Somewhat difficult Somewhat difficult Somewhat difficult Not difficult at all     Depression screen [facility] 2/9 10/03/2020 08/22/2020 07/31/2020  Decreased Interest 2 1 2   Down, Depressed, Hopeless 1 1 1   PHQ - 2 Score 3 2 3   Altered sleeping 0 0 3  Tired, decreased energy 2 1 2   Change in appetite 2 2 3   Feeling bad or failure about yourself  0 0 0  Trouble concentrating 2 0 0  Moving slowly or fidgety/restless 2 0 0  Suicidal thoughts 0 0 0  PHQ-9 Score 11 5 11   Difficult doing work/chores Somewhat difficult  Somewhat difficult Somewhat difficult  Some recent data might be hidden    History Roy [name] has a past medical history of Anxiety, Depression, Hypertension, Low testosterone, Median nerve dysfunction, Radiculopathy of cervical region, Thyroid disease, Ulnar neuropathy at elbow, and Weakness.   He has a past surgical history that includes Rotator cuff repair (Right); Biceps tendon repair; and Neck surgery.   His family history includes Arrhythmia in his father; Asthma in his brother and daughter; Bipolar disorder in his brother; COPD in his father; Fibromyalgia in his sister; Hypertension in his mother; Stroke (age of onset: 73) in his mother.He reports that he quit smoking about 3 years ago. His smoking use included cigarettes. He has a 15.00 pack-year smoking history. He has quit using smokeless tobacco.  His smokeless tobacco use included snuff and chew. He reports that he does not drink alcohol and does not use drugs.    ROS Review of Systems  Constitutional: Negative for fever.  Respiratory: Negative for shortness of breath.   Cardiovascular: Positive for chest pain (See HPI).  Musculoskeletal: Negative for arthralgias.  Skin: Negative for rash.  Psychiatric/Behavioral: Positive for dysphoric mood. The patient is nervous/anxious.     Objective:  BP 132/82   Pulse 66   Temp (!) 97.1 F (36.2 C) (Temporal)   Ht 5\' 8"  (1.727 m)   Wt (!) 351 lb 6.4 oz (159.4 kg)   BMI 53.43 kg/m   BP Readings from Last 3 Encounters:  10/03/20 132/82  08/22/20 130/85  07/31/20 128/85    Wt Readings from Last 3 Encounters:  10/03/20 (!) 351 lb 6.4 oz (159.4 kg)  08/22/20 (!) 353 lb 12.8 oz (160.5 kg)  07/31/20 (!) 357 lb 6.4 oz (162.1 kg)     Physical Exam Vitals reviewed.  Constitutional:      Appearance: He is well-developed and well-nourished.  HENT:     Head: Normocephalic and atraumatic.     Right Ear: Tympanic membrane and external ear normal. No decreased hearing noted.      Left Ear: Tympanic membrane and external ear normal. No decreased hearing noted.     Mouth/Throat:     Pharynx: No oropharyngeal exudate or posterior oropharyngeal erythema.  Eyes:     Pupils: Pupils are equal, round, and reactive to light.  Cardiovascular:     Rate and Rhythm: Normal rate and regular rhythm.     Heart sounds: No murmur heard.   Pulmonary:     Effort: No respiratory distress.     Breath sounds: Normal breath sounds.  Abdominal:     General: Bowel sounds are normal.     Palpations: Abdomen is soft. There is no mass.     Tenderness: There is no abdominal tenderness.  Musculoskeletal:     Cervical back: Normal range of motion and neck supple.      EK.  G today shows no acute ischemic changes and it is unchanged from previous tracing Assessment & Plan:   Roy [name] was seen today for medical management of chronic issues.  Diagnoses and all orders for this visit:  Chest pain, unspecified type -     EKG 12-Lead  GAD (generalized anxiety disorder) -     QUEtiapine (SEROQUEL) 100 MG tablet; TAKE 1 TABLET BY MOUTH AT BEDTIME FOR  NERVES       I have changed Roy [name]'s QUEtiapine. I am also having him maintain his acetaminophen, traZODone, sildenafil, levothyroxine, celecoxib, budesonide-formoterol, carvedilol, Dexilant, DULoxetine, gabapentin, linaclotide, albuterol, ALPRAZolam, fexofenadine-pseudoephedrine, and gemfibrozil.  Allergies as of 10/03/2020      Reactions   Penicillins Rash, Other (See Comments)   Has patient had a PCN reaction causing immediate rash, facial/tongue/throat swelling, SOB or lightheadedness with hypotension: Yes Has patient had a PCN reaction causing severe rash involving mucus membranes or skin necrosis: No Has patient had a PCN reaction that required hospitalization: No Has patient had a PCN reaction occurring within the last 10 years: No If all of the above answers are "NO", then may proceed with Cephalosporin use. Has  patient had a PCN reaction causing immediate rash, facial/tongue/throat swelling, SOB or lightheadedness with hypotension: Yes Has patient had a PCN reaction causing severe rash involving mucus membranes or skin necrosis: No Has patient had a PCN reaction that required hospitalization: No Has patient had a PCN reaction occurring within the last 10 years: No If all of the above answers are "NO", then may proceed with Cephalosporin use. unknown   Chantix [varenicline] Other (See Comments)   Severe headache      Medication List       Accurate as of October 03, 2020 12:05 PM. If you have any questions, ask your nurse or doctor.  acetaminophen 500 MG tablet Commonly known as: TYLENOL Take 1,000 mg by mouth every 6 (six) hours as needed for headache (pain).   albuterol 108 (90 Base) MCG/ACT inhaler Commonly known as: VENTOLIN HFA Inhale 1-2 puffs into the lungs every 6 (six) hours as needed for wheezing or shortness of breath.   ALPRAZolam 0.5 MG tablet Commonly known as: XANAX Take 1 tablet (0.5 mg total) by mouth 3 (three) times daily as needed. for anxiety   budesonide-formoterol 160-4.5 MCG/ACT inhaler Commonly known as: Symbicort INHALE TWO PUFFS BY MOUTH TWICE DAILY FOR COPD/ASTHMA What changed:   how much to take  how to take this  when to take this  additional instructions   carvedilol 6.25 MG tablet Commonly known as: COREG Take 1 tablet (6.25 mg total) by mouth 2 (two) times daily with a meal.   celecoxib 200 MG capsule Commonly known as: CeleBREX Take 1 capsule (200 mg total) by mouth daily. For arthritis   Dexilant 60 MG capsule Generic drug: dexlansoprazole Take 1 capsule (60 mg total) by mouth daily.   DULoxetine 60 MG capsule Commonly known as: CYMBALTA Take 2 capsules (120 mg total) by mouth daily.   fexofenadine-pseudoephedrine 180-240 MG 24 hr tablet Commonly known as: ALLEGRA-D 24 Take 1 tablet by mouth every evening. For allergy and  congestion   gabapentin 300 MG capsule Commonly known as: NEURONTIN Take 1 capsule (300 mg total) by mouth 3 (three) times daily.   gemfibrozil 600 MG tablet Commonly known as: LOPID Take 1 tablet by mouth twice daily   levothyroxine 50 MCG tablet Commonly known as: SYNTHROID Take 1 tablet (50 mcg total) by mouth daily.   linaclotide 290 MCG Caps capsule Commonly known as: LINZESS Take 1 capsule (290 mcg total) by mouth daily as needed (constipation).   QUEtiapine 100 MG tablet Commonly known as: SEROQUEL TAKE 1 TABLET BY MOUTH AT BEDTIME FOR  NERVES What changed: medication strength Changed by: Mechele Claude, MD   sildenafil 20 MG tablet Commonly known as: REVATIO Take 2-5 tablets daily as needed for sex   traZODone 100 MG tablet Commonly known as: DESYREL Take 1 tablet (100 mg total) by mouth at bedtime.        Follow-up: Return in about 1 month (around 11/03/2020).  Mechele Claude, M.D.

## 2020-10-09 ENCOUNTER — Other Ambulatory Visit: Payer: Self-pay | Admitting: Family Medicine

## 2020-10-09 DIAGNOSIS — E039 Hypothyroidism, unspecified: Secondary | ICD-10-CM

## 2020-10-24 ENCOUNTER — Other Ambulatory Visit: Payer: Self-pay | Admitting: Family Medicine

## 2020-11-14 ENCOUNTER — Ambulatory Visit (INDEPENDENT_AMBULATORY_CARE_PROVIDER_SITE_OTHER): Payer: Medicare HMO | Admitting: Family Medicine

## 2020-11-14 ENCOUNTER — Encounter: Payer: Self-pay | Admitting: Family Medicine

## 2020-11-14 ENCOUNTER — Other Ambulatory Visit: Payer: Self-pay

## 2020-11-14 VITALS — BP 123/74 | HR 57 | Temp 98.3°F | Resp 20 | Ht 68.0 in | Wt 354.0 lb

## 2020-11-14 DIAGNOSIS — Z6841 Body Mass Index (BMI) 40.0 and over, adult: Secondary | ICD-10-CM | POA: Diagnosis not present

## 2020-11-14 DIAGNOSIS — Z79899 Other long term (current) drug therapy: Secondary | ICD-10-CM | POA: Diagnosis not present

## 2020-11-14 DIAGNOSIS — F411 Generalized anxiety disorder: Secondary | ICD-10-CM | POA: Diagnosis not present

## 2020-11-14 DIAGNOSIS — E349 Endocrine disorder, unspecified: Secondary | ICD-10-CM | POA: Diagnosis not present

## 2020-11-14 DIAGNOSIS — Z1211 Encounter for screening for malignant neoplasm of colon: Secondary | ICD-10-CM | POA: Diagnosis not present

## 2020-11-14 DIAGNOSIS — E559 Vitamin D deficiency, unspecified: Secondary | ICD-10-CM | POA: Diagnosis not present

## 2020-11-14 DIAGNOSIS — E038 Other specified hypothyroidism: Secondary | ICD-10-CM | POA: Diagnosis not present

## 2020-11-14 DIAGNOSIS — E782 Mixed hyperlipidemia: Secondary | ICD-10-CM

## 2020-11-14 DIAGNOSIS — I10 Essential (primary) hypertension: Secondary | ICD-10-CM

## 2020-11-14 DIAGNOSIS — K2100 Gastro-esophageal reflux disease with esophagitis, without bleeding: Secondary | ICD-10-CM

## 2020-11-14 MED ORDER — QUETIAPINE FUMARATE 200 MG PO TABS
ORAL_TABLET | ORAL | 2 refills | Status: DC
Start: 2020-11-14 — End: 2021-02-16

## 2020-11-14 NOTE — Progress Notes (Signed)
Subjective:  Patient ID: Roy [name], male    DOB: [birth year]  Age: 50 y.o. MRN: [id number]  CC: Medical Management of Chronic Issues, Hyperlipidemia, and Hypertension (Anxiety = 6 mo )   HPI Roy [name] presents for follow-up of hypertension. Patient has no history of headache chest pain or shortness of breath or recent cough. Patient also denies symptoms of TIA such as numbness weakness lateralizing. Patient checks  blood pressure at home and has not had any elevated readings recently. Patient denies side effects from his medication. States taking it regularly.   follow-up on  thyroid. The patient has a history of hypothyroidism for many years. It has been stable recently. Pt. denies any change in  voice, loss of hair, heat or cold intolerance. Energy level has been adequate to good. Patient denies constipation and diarrhea. No myxedema. Medication is as noted below. Verified that pt is taking it daily on an empty stomach. Well tolerated.    Stressors - daughter always wanting things. Brother lives with him. Eats more than what he pays for. Pt. Has to provide his transportation. Brother is bipolar.  Seroquel does help keep him calm. Not sleeping well. Tossing and turning.   GAD 7 : Generalized Anxiety Score 11/14/2020 10/03/2020 08/22/2020 07/31/2020  Nervous, Anxious, on Edge 0 '1 1 2  ' Control/stop worrying 2 0 1 3  Worry too much - different things '2 1 2 3  ' Trouble relaxing 1 0 2 3  Restless 0 0 0 1  Easily annoyed or irritable '2 1 1 2  ' Afraid - awful might happen '1 1 2 3  ' Total GAD 7 Score '8 4 9 17  ' Anxiety Difficulty Somewhat difficult Somewhat difficult Somewhat difficult Somewhat difficult     Depression screen [facility] 2/9 11/14/2020 10/03/2020 08/22/2020  Decreased Interest '2 2 1  ' Down, Depressed, Hopeless '1 1 1  ' PHQ - 2 Score '3 3 2  ' Altered sleeping 1 0 0  Tired, decreased energy '2 2 1  ' Change in appetite '1 2 2  ' Feeling bad or failure about yourself  0 0 0  Trouble  concentrating 0 2 0  Moving slowly or fidgety/restless 0 2 0  Suicidal thoughts 0 0 0  PHQ-9 Score '7 11 5  ' Difficult doing work/chores Somewhat difficult Somewhat difficult Somewhat difficult  Some recent data might be hidden    History Fleet has a past medical history of Anxiety, Depression, Hypertension, Low testosterone, Median nerve dysfunction, Radiculopathy of cervical region, Thyroid disease, Ulnar neuropathy at elbow, and Weakness.   He has a past surgical history that includes Rotator cuff repair (Right); Biceps tendon repair; and Neck surgery.   His family history includes Arrhythmia in his father; Asthma in his brother and daughter; Bipolar disorder in his brother; COPD in his father; Fibromyalgia in his sister; Hypertension in his mother; Stroke (age of onset: 33) in his mother.He reports that he quit smoking about 3 years ago. His smoking use included cigarettes. He has a 15.00 pack-year smoking history. He has quit using smokeless tobacco.  His smokeless tobacco use included snuff and chew. He reports that he does not drink alcohol and does not use drugs.    ROS Review of Systems  Constitutional: Negative for fever.  Respiratory: Negative for shortness of breath.   Cardiovascular: Negative for chest pain.  Musculoskeletal: Negative for arthralgias.  Skin: Negative for rash.  Psychiatric/Behavioral: Positive for agitation and dysphoric mood. The patient is nervous/anxious.     Objective:  BP 123/74   Pulse (!) 57   Temp 98.3 F (36.8 C)   Resp 20   Ht '5\' 8"'  (1.727 m)   Wt (!) 354 lb (160.6 kg)   SpO2 97%   BMI 53.83 kg/m   BP Readings from Last 3 Encounters:  11/14/20 123/74  10/03/20 132/82  08/22/20 130/85    Wt Readings from Last 3 Encounters:  11/14/20 (!) 354 lb (160.6 kg)  10/03/20 (!) 351 lb 6.4 oz (159.4 kg)  08/22/20 (!) 353 lb 12.8 oz (160.5 kg)     Physical Exam Vitals reviewed.  Constitutional:      Appearance: He is well-developed and  well-nourished.  HENT:     Head: Normocephalic and atraumatic.     Right Ear: Tympanic membrane and external ear normal. No decreased hearing noted.     Left Ear: Tympanic membrane and external ear normal. No decreased hearing noted.     Mouth/Throat:     Pharynx: No oropharyngeal exudate or posterior oropharyngeal erythema.  Eyes:     Pupils: Pupils are equal, round, and reactive to light.  Cardiovascular:     Rate and Rhythm: Normal rate and regular rhythm.     Heart sounds: No murmur heard.   Pulmonary:     Effort: No respiratory distress.     Breath sounds: Normal breath sounds.  Abdominal:     General: Bowel sounds are normal.     Palpations: Abdomen is soft. There is no mass.     Tenderness: There is no abdominal tenderness.  Musculoskeletal:     Cervical back: Normal range of motion and neck supple.       Assessment & Plan:   Roy [name] was seen today for medical management of chronic issues, hyperlipidemia and hypertension.  Diagnoses and all orders for this visit:  Other specified hypothyroidism -     CBC with Differential/Platelet -     CMP14+EGFR -     TSH + free T4  Controlled substance agreement signed -     ToxASSURE Select 13 (MW), Urine -     CBC with Differential/Platelet -     CMP14+EGFR  Benign essential HTN -     CBC with Differential/Platelet -     CMP14+EGFR  Mixed hyperlipidemia -     CBC with Differential/Platelet -     CMP14+EGFR -     Lipid panel  Gastroesophageal reflux disease with esophagitis without hemorrhage -     CBC with Differential/Platelet -     CMP14+EGFR  GAD (generalized anxiety disorder) -     QUEtiapine (SEROQUEL) 200 MG tablet; TAKE 1 TABLET BY MOUTH AT BEDTIME FOR  NERVES -     CBC with Differential/Platelet -     CMP14+EGFR  Screen for colon cancer -     Ambulatory referral to Gastroenterology -     CBC with Differential/Platelet -     CMP14+EGFR  Hypotestosteronism -     Testosterone,Free and  Total  Vitamin D deficiency -     VITAMIN D 25 Hydroxy (Vit-D Deficiency, Fractures)       I have changed [name]. Lamoureaux's QUEtiapine. I am also having him maintain his acetaminophen, traZODone, sildenafil, celecoxib, budesonide-formoterol, carvedilol, Dexilant, DULoxetine, gabapentin, linaclotide, albuterol, ALPRAZolam, fexofenadine-pseudoephedrine, levothyroxine, and gemfibrozil.  Allergies as of 11/14/2020      Reactions   Penicillins Rash, Other (See Comments)   Has patient had a PCN reaction causing immediate rash, facial/tongue/throat swelling, SOB or lightheadedness with hypotension: Yes Has  patient had a PCN reaction causing severe rash involving mucus membranes or skin necrosis: No Has patient had a PCN reaction that required hospitalization: No Has patient had a PCN reaction occurring within the last 10 years: No If all of the above answers are "NO", then may proceed with Cephalosporin use. Has patient had a PCN reaction causing immediate rash, facial/tongue/throat swelling, SOB or lightheadedness with hypotension: Yes Has patient had a PCN reaction causing severe rash involving mucus membranes or skin necrosis: No Has patient had a PCN reaction that required hospitalization: No Has patient had a PCN reaction occurring within the last 10 years: No If all of the above answers are "NO", then may proceed with Cephalosporin use. unknown   Chantix [varenicline] Other (See Comments)   Severe headache      Medication List       Accurate as of November 14, 2020  5:57 PM. If you have any questions, ask your nurse or doctor.        acetaminophen 500 MG tablet Commonly known as: TYLENOL Take 1,000 mg by mouth every 6 (six) hours as needed for headache (pain).   albuterol 108 (90 Base) MCG/ACT inhaler Commonly known as: VENTOLIN HFA Inhale 1-2 puffs into the lungs every 6 (six) hours as needed for wheezing or shortness of breath.   ALPRAZolam 0.5 MG tablet Commonly known  as: XANAX Take 1 tablet (0.5 mg total) by mouth 3 (three) times daily as needed. for anxiety   budesonide-formoterol 160-4.5 MCG/ACT inhaler Commonly known as: Symbicort INHALE TWO PUFFS BY MOUTH TWICE DAILY FOR COPD/ASTHMA What changed:   how much to take  how to take this  when to take this  additional instructions   carvedilol 6.25 MG tablet Commonly known as: COREG Take 1 tablet (6.25 mg total) by mouth 2 (two) times daily with a meal.   celecoxib 200 MG capsule Commonly known as: CeleBREX Take 1 capsule (200 mg total) by mouth daily. For arthritis   Dexilant 60 MG capsule Generic drug: dexlansoprazole Take 1 capsule (60 mg total) by mouth daily.   DULoxetine 60 MG capsule Commonly known as: CYMBALTA Take 2 capsules (120 mg total) by mouth daily.   fexofenadine-pseudoephedrine 180-240 MG 24 hr tablet Commonly known as: ALLEGRA-D 24 Take 1 tablet by mouth every evening. For allergy and congestion   gabapentin 300 MG capsule Commonly known as: NEURONTIN Take 1 capsule (300 mg total) by mouth 3 (three) times daily.   gemfibrozil 600 MG tablet Commonly known as: LOPID Take 1 tablet by mouth twice daily   levothyroxine 50 MCG tablet Commonly known as: SYNTHROID Take 1 tablet by mouth once daily   linaclotide 290 MCG Caps capsule Commonly known as: LINZESS Take 1 capsule (290 mcg total) by mouth daily as needed (constipation).   QUEtiapine 200 MG tablet Commonly known as: SEROQUEL TAKE 1 TABLET BY MOUTH AT BEDTIME FOR  NERVES What changed: medication strength Changed by: Claretta Fraise, MD   sildenafil 20 MG tablet Commonly known as: REVATIO Take 2-5 tablets daily as needed for sex   traZODone 100 MG tablet Commonly known as: DESYREL Take 1 tablet (100 mg total) by mouth at bedtime.        Follow-up: Return in about 1 month (around 12/12/2020).  Claretta Fraise, M.D.

## 2020-11-15 ENCOUNTER — Other Ambulatory Visit: Payer: Self-pay | Admitting: Family Medicine

## 2020-11-15 LAB — CBC WITH DIFFERENTIAL/PLATELET
Basophils Absolute: 0 10*3/uL (ref 0.0–0.2)
Basos: 1 %
EOS (ABSOLUTE): 0.1 10*3/uL (ref 0.0–0.4)
Eos: 2 %
Hematocrit: 40.9 % (ref 37.5–51.0)
Hemoglobin: 14 g/dL (ref 13.0–17.7)
Immature Grans (Abs): 0 10*3/uL (ref 0.0–0.1)
Immature Granulocytes: 0 %
Lymphocytes Absolute: 1.6 10*3/uL (ref 0.7–3.1)
Lymphs: 34 %
MCH: 30.8 pg (ref 26.6–33.0)
MCHC: 34.2 g/dL (ref 31.5–35.7)
MCV: 90 fL (ref 79–97)
Monocytes Absolute: 0.4 10*3/uL (ref 0.1–0.9)
Monocytes: 8 %
Neutrophils Absolute: 2.6 10*3/uL (ref 1.4–7.0)
Neutrophils: 55 %
Platelets: 234 10*3/uL (ref 150–450)
RBC: 4.54 x10E6/uL (ref 4.14–5.80)
RDW: 13 % (ref 11.6–15.4)
WBC: 4.7 10*3/uL (ref 3.4–10.8)

## 2020-11-15 LAB — CMP14+EGFR
ALT: 22 IU/L (ref 0–44)
AST: 22 IU/L (ref 0–40)
Albumin/Globulin Ratio: 2 (ref 1.2–2.2)
Albumin: 4.8 g/dL (ref 4.0–5.0)
Alkaline Phosphatase: 76 IU/L (ref 44–121)
BUN/Creatinine Ratio: 8 — ABNORMAL LOW (ref 9–20)
BUN: 8 mg/dL (ref 6–24)
Bilirubin Total: 0.3 mg/dL (ref 0.0–1.2)
CO2: 17 mmol/L — ABNORMAL LOW (ref 20–29)
Calcium: 9.5 mg/dL (ref 8.7–10.2)
Chloride: 101 mmol/L (ref 96–106)
Creatinine, Ser: 0.97 mg/dL (ref 0.76–1.27)
GFR calc Af Amer: 107 mL/min/{1.73_m2} (ref >59)
GFR calc non Af Amer: 93 mL/min/{1.73_m2} (ref >59)
Globulin, Total: 2.4 g/dL (ref 1.5–4.5)
Glucose: 94 mg/dL (ref 65–99)
Potassium: 4.3 mmol/L (ref 3.5–5.2)
Sodium: 139 mmol/L (ref 134–144)
Total Protein: 7.2 g/dL (ref 6.0–8.5)

## 2020-11-15 LAB — TESTOSTERONE,FREE AND TOTAL
Testosterone, Free: 9.1 pg/mL (ref 6.8–21.5)
Testosterone: 251 ng/dL — ABNORMAL LOW (ref 264–916)

## 2020-11-15 LAB — LIPID PANEL
Chol/HDL Ratio: 4.5 ratio (ref 0.0–5.0)
Cholesterol, Total: 175 mg/dL (ref 100–199)
HDL: 39 mg/dL — ABNORMAL LOW (ref >39)
LDL Chol Calc (NIH): 113 mg/dL — ABNORMAL HIGH (ref 0–99)
Triglycerides: 125 mg/dL (ref 0–149)
VLDL Cholesterol Cal: 23 mg/dL (ref 5–40)

## 2020-11-15 LAB — TSH+FREE T4
Free T4: 1.09 ng/dL (ref 0.82–1.77)
TSH: 1.95 u[IU]/mL (ref 0.450–4.500)

## 2020-11-15 LAB — VITAMIN D 25 HYDROXY (VIT D DEFICIENCY, FRACTURES): Vit D, 25-Hydroxy: 14.9 ng/mL — ABNORMAL LOW (ref 30.0–100.0)

## 2020-11-15 MED ORDER — VITAMIN D (ERGOCALCIFEROL) 1.25 MG (50000 UNIT) PO CAPS
50000.0000 [IU] | ORAL_CAPSULE | ORAL | 3 refills | Status: DC
Start: 2020-11-15 — End: 2021-09-12

## 2020-11-18 LAB — TOXASSURE SELECT 13 (MW), URINE

## 2020-11-23 ENCOUNTER — Other Ambulatory Visit: Payer: Self-pay | Admitting: Family Medicine

## 2020-12-09 ENCOUNTER — Other Ambulatory Visit: Payer: Self-pay | Admitting: Family Medicine

## 2020-12-09 DIAGNOSIS — J418 Mixed simple and mucopurulent chronic bronchitis: Secondary | ICD-10-CM

## 2020-12-14 ENCOUNTER — Other Ambulatory Visit: Payer: Self-pay

## 2020-12-14 ENCOUNTER — Encounter: Payer: Self-pay | Admitting: Family Medicine

## 2020-12-14 ENCOUNTER — Ambulatory Visit (INDEPENDENT_AMBULATORY_CARE_PROVIDER_SITE_OTHER): Payer: Medicare HMO

## 2020-12-14 ENCOUNTER — Ambulatory Visit (INDEPENDENT_AMBULATORY_CARE_PROVIDER_SITE_OTHER): Payer: Medicare HMO | Admitting: Family Medicine

## 2020-12-14 VITALS — BP 111/69 | HR 71 | Temp 97.9°F | Ht 68.0 in | Wt 354.0 lb

## 2020-12-14 DIAGNOSIS — R0789 Other chest pain: Secondary | ICD-10-CM

## 2020-12-14 DIAGNOSIS — R0600 Dyspnea, unspecified: Secondary | ICD-10-CM | POA: Diagnosis not present

## 2020-12-14 DIAGNOSIS — E039 Hypothyroidism, unspecified: Secondary | ICD-10-CM

## 2020-12-14 DIAGNOSIS — I10 Essential (primary) hypertension: Secondary | ICD-10-CM | POA: Diagnosis not present

## 2020-12-14 DIAGNOSIS — J449 Chronic obstructive pulmonary disease, unspecified: Secondary | ICD-10-CM | POA: Diagnosis not present

## 2020-12-14 DIAGNOSIS — J40 Bronchitis, not specified as acute or chronic: Secondary | ICD-10-CM | POA: Diagnosis not present

## 2020-12-14 DIAGNOSIS — R059 Cough, unspecified: Secondary | ICD-10-CM | POA: Diagnosis not present

## 2020-12-14 MED ORDER — ALBUTEROL SULFATE HFA 108 (90 BASE) MCG/ACT IN AERS: 1.0000 | INHALATION_SPRAY | Freq: Four times a day (QID) | RESPIRATORY_TRACT | 5 refills | Status: DC | PRN

## 2020-12-14 MED ORDER — LEVOFLOXACIN 500 MG PO TABS
500.0000 mg | ORAL_TABLET | Freq: Every day | ORAL | 0 refills | Status: DC
Start: 2020-12-14 — End: 2020-12-24

## 2020-12-14 MED ORDER — CARVEDILOL 6.25 MG PO TABS: 6.2500 mg | ORAL_TABLET | Freq: Two times a day (BID) | ORAL | 1 refills | Status: DC

## 2020-12-14 MED ORDER — LEVOTHYROXINE SODIUM 50 MCG PO TABS: 50.0000 ug | ORAL_TABLET | Freq: Every day | ORAL | 1 refills | Status: DC

## 2020-12-14 NOTE — Progress Notes (Signed)
Subjective:  Patient ID: Roy [name], male    DOB: 05/13/1971  Age: 50 y.o. MRN: [id number]  CC: Follow-up (Med change)   HPI Roy [name] presents for substernal burning off and on for three days. Bringing up mucous by clearing throat. Not coughing. Also dyspneic off and on. Has to take a deep breath. LAsts up to 10-15 min. 2x a day. Also for 3 days. No fever. No malaise or myalgia.   Patient presents for follow-up on  thyroid. The patient has a history of hypothyroidism for many years. It has been stable recently. Pt. denies any change in  voice, loss of hair, heat or cold intolerance. Energy level has been adequate to good. Patient denies constipation and diarrhea. No myxedema. Medication is as noted below. Verified that pt is taking it daily on an empty stomach. Well tolerated.  Depression screen Roy [name] 2/9 12/14/2020 11/14/2020 10/03/2020  Decreased Interest 2 2 2   Down, Depressed, Hopeless 1 1 1   PHQ - 2 Score 3 3 3   Altered sleeping 1 1 0  Tired, decreased energy 2 2 2   Change in appetite 3 1 2   Feeling bad or failure about yourself  0 0 0  Trouble concentrating 0 0 2  Moving slowly or fidgety/restless 0 0 2  Suicidal thoughts 0 0 0  PHQ-9 Score 9 7 11   Difficult doing work/chores Somewhat difficult Somewhat difficult Somewhat difficult  Some recent data might be hidden    History Roy [name] has a past medical history of Anxiety, Depression, Hypertension, Low testosterone, Median nerve dysfunction, Radiculopathy of cervical region, Thyroid disease, Ulnar neuropathy at elbow, and Weakness.   He has a past surgical history that includes Rotator cuff repair (Right); Biceps tendon repair; and Neck surgery.   His family history includes Arrhythmia in his father; Asthma in his brother and daughter; Bipolar disorder in his brother; COPD in his father; Fibromyalgia in his sister; Hypertension in his mother; Stroke (age of onset: 36) in his mother.He reports that he quit smoking about 3  years ago. His smoking use included cigarettes. He has a 15.00 pack-year smoking history. He has quit using smokeless tobacco.  His smokeless tobacco use included snuff and chew. He reports that he does not drink alcohol and does not use drugs.    ROS Review of Systems  Constitutional: Negative for activity change, appetite change, chills and fever.  HENT: Positive for congestion, postnasal drip, rhinorrhea and sinus pressure. Negative for ear discharge, ear pain, hearing loss, nosebleeds, sneezing and trouble swallowing.   Respiratory: Positive for cough. Negative for chest tightness and shortness of breath.   Cardiovascular: Negative for chest pain and palpitations.  Skin: Negative for rash.    Objective:  BP 111/69   Pulse 71   Temp 97.9 F (36.6 C)   Ht 5\' 8"  (1.727 m)   Wt (!) 354 lb (160.6 kg)   SpO2 96%   BMI 53.83 kg/m   BP Readings from Last 3 Encounters:  12/14/20 111/69  11/14/20 123/74  10/03/20 132/82    Wt Readings from Last 3 Encounters:  12/14/20 (!) 354 lb (160.6 kg)  11/14/20 (!) 354 lb (160.6 kg)  10/03/20 (!) 351 lb 6.4 oz (159.4 kg)     Physical Exam Constitutional:      Appearance: He is well-developed.  HENT:     Head: Normocephalic and atraumatic.     Right Ear: Tympanic membrane and external ear normal. No decreased hearing noted.  Left Ear: Tympanic membrane and external ear normal. No decreased hearing noted.     Nose: Mucosal edema present.     Right Sinus: No frontal sinus tenderness.     Left Sinus: No frontal sinus tenderness.     Mouth/Throat:     Pharynx: No oropharyngeal exudate or posterior oropharyngeal erythema.  Neck:     Meningeal: Brudzinski's sign absent.  Pulmonary:     Effort: No respiratory distress.     Breath sounds: Wheezing present.  Lymphadenopathy:     Head:     Right side of head: No preauricular adenopathy.     Left side of head: No preauricular adenopathy.     Cervical:     Right cervical: No  superficial cervical adenopathy.    Left cervical: No superficial cervical adenopathy.       Assessment & Plan:   Roy [name] was seen today for follow-up.  Diagnoses and all orders for this visit:  Other chest pain -     DG Chest 2 View; Future  Bronchitis -     albuterol (VENTOLIN HFA) 108 (90 Base) MCG/ACT inhaler; Inhale 1-2 puffs into the lungs every 6 (six) hours as needed for wheezing or shortness of breath.  Benign essential HTN -     carvedilol (COREG) 6.25 MG tablet; Take 1 tablet (6.25 mg total) by mouth 2 (two) times daily with a meal.  Hypothyroidism, unspecified type -     levothyroxine (SYNTHROID) 50 MCG tablet; Take 1 tablet (50 mcg total) by mouth daily.  Other orders -     levofloxacin (LEVAQUIN) 500 MG tablet; Take 1 tablet (500 mg total) by mouth daily.       I have changed Roy [name]. Roy [name]'s levothyroxine. I am also having him start on levofloxacin. Additionally, I am having him maintain his acetaminophen, traZODone, sildenafil, celecoxib, Dexilant, DULoxetine, gabapentin, linaclotide, ALPRAZolam, fexofenadine-pseudoephedrine, QUEtiapine, Vitamin D (Ergocalciferol), gemfibrozil, Symbicort, albuterol, and carvedilol.  Allergies as of 12/14/2020      Reactions   Penicillins Rash, Other (See Comments)   Has patient had a PCN reaction causing immediate rash, facial/tongue/throat swelling, SOB or lightheadedness with hypotension: Yes Has patient had a PCN reaction causing severe rash involving mucus membranes or skin necrosis: No Has patient had a PCN reaction that required hospitalization: No Has patient had a PCN reaction occurring within the last 10 years: No If all of the above answers are "NO", then may proceed with Cephalosporin use. Has patient had a PCN reaction causing immediate rash, facial/tongue/throat swelling, SOB or lightheadedness with hypotension: Yes Has patient had a PCN reaction causing severe rash involving mucus membranes or skin necrosis:  No Has patient had a PCN reaction that required hospitalization: No Has patient had a PCN reaction occurring within the last 10 years: No If all of the above answers are "NO", then may proceed with Cephalosporin use. unknown   Roy [name] [varenicline] Other (See Comments)   Severe headache      Medication List       Accurate as of December 14, 2020 11:59 PM. If you have any questions, ask your nurse or doctor.        acetaminophen 500 MG tablet Commonly known as: TYLENOL Take 1,000 mg by mouth every 6 (six) hours as needed for headache (pain).   albuterol 108 (90 Base) MCG/ACT inhaler Commonly known as: VENTOLIN HFA Inhale 1-2 puffs into the lungs every 6 (six) hours as needed for wheezing or shortness of breath.   ALPRAZolam 0.5  MG tablet Commonly known as: XANAX Take 1 tablet (0.5 mg total) by mouth 3 (three) times daily as needed. for anxiety   carvedilol 6.25 MG tablet Commonly known as: COREG Take 1 tablet (6.25 mg total) by mouth 2 (two) times daily with a meal.   celecoxib 200 MG capsule Commonly known as: CeleBREX Take 1 capsule (200 mg total) by mouth daily. For arthritis   Dexilant 60 MG capsule Generic drug: dexlansoprazole Take 1 capsule (60 mg total) by mouth daily.   DULoxetine 60 MG capsule Commonly known as: CYMBALTA Take 2 capsules (120 mg total) by mouth daily.   fexofenadine-pseudoephedrine 180-240 MG 24 hr tablet Commonly known as: ALLEGRA-D 24 Take 1 tablet by mouth every evening. For allergy and congestion   gabapentin 300 MG capsule Commonly known as: NEURONTIN Take 1 capsule (300 mg total) by mouth 3 (three) times daily.   gemfibrozil 600 MG tablet Commonly known as: LOPID Take 1 tablet by mouth twice daily   levofloxacin 500 MG tablet Commonly known as: LEVAQUIN Take 1 tablet (500 mg total) by mouth daily. Started by: Mechele Claude, MD   levothyroxine 50 MCG tablet Commonly known as: SYNTHROID Take 1 tablet (50 mcg total) by mouth  daily.   linaclotide 290 MCG Caps capsule Commonly known as: LINZESS Take 1 capsule (290 mcg total) by mouth daily as needed (constipation).   QUEtiapine 200 MG tablet Commonly known as: SEROQUEL TAKE 1 TABLET BY MOUTH AT BEDTIME FOR  NERVES   sildenafil 20 MG tablet Commonly known as: REVATIO Take 2-5 tablets daily as needed for sex   Symbicort 160-4.5 MCG/ACT inhaler Generic drug: budesonide-formoterol INHALE 2 PUFFS BY MOUTH TWICE DAILY FOR COPD AND ASTHMA   traZODone 100 MG tablet Commonly known as: DESYREL Take 1 tablet (100 mg total) by mouth at bedtime.   Vitamin D (Ergocalciferol) 1.25 MG (50000 UNIT) Caps capsule Commonly known as: DRISDOL Take 1 capsule (50,000 Units total) by mouth every 7 (seven) days.        Follow-up: Return if symptoms worsen or fail to improve.  Mechele Claude, M.D.

## 2020-12-18 ENCOUNTER — Encounter: Payer: Self-pay | Admitting: Family Medicine

## 2020-12-18 NOTE — Progress Notes (Signed)
Your chest x-ray looked normal. Thanks, WS.

## 2020-12-20 ENCOUNTER — Other Ambulatory Visit: Payer: Self-pay | Admitting: Family Medicine

## 2020-12-21 ENCOUNTER — Encounter: Payer: Self-pay | Admitting: Family Medicine

## 2020-12-24 ENCOUNTER — Other Ambulatory Visit: Payer: Self-pay | Admitting: Family Medicine

## 2020-12-24 MED ORDER — LEVOFLOXACIN 500 MG PO TABS
500.0000 mg | ORAL_TABLET | Freq: Every day | ORAL | 0 refills | Status: DC
Start: 2020-12-24 — End: 2021-03-13

## 2020-12-31 IMAGING — DX DG CHEST 2V
2 series · 2 of 2 positions shown · non-contrast
Comparison: 06/02/2020

CLINICAL DATA: Cough and dyspnea

EXAM:
CHEST - 2 VIEW

[chest pa]
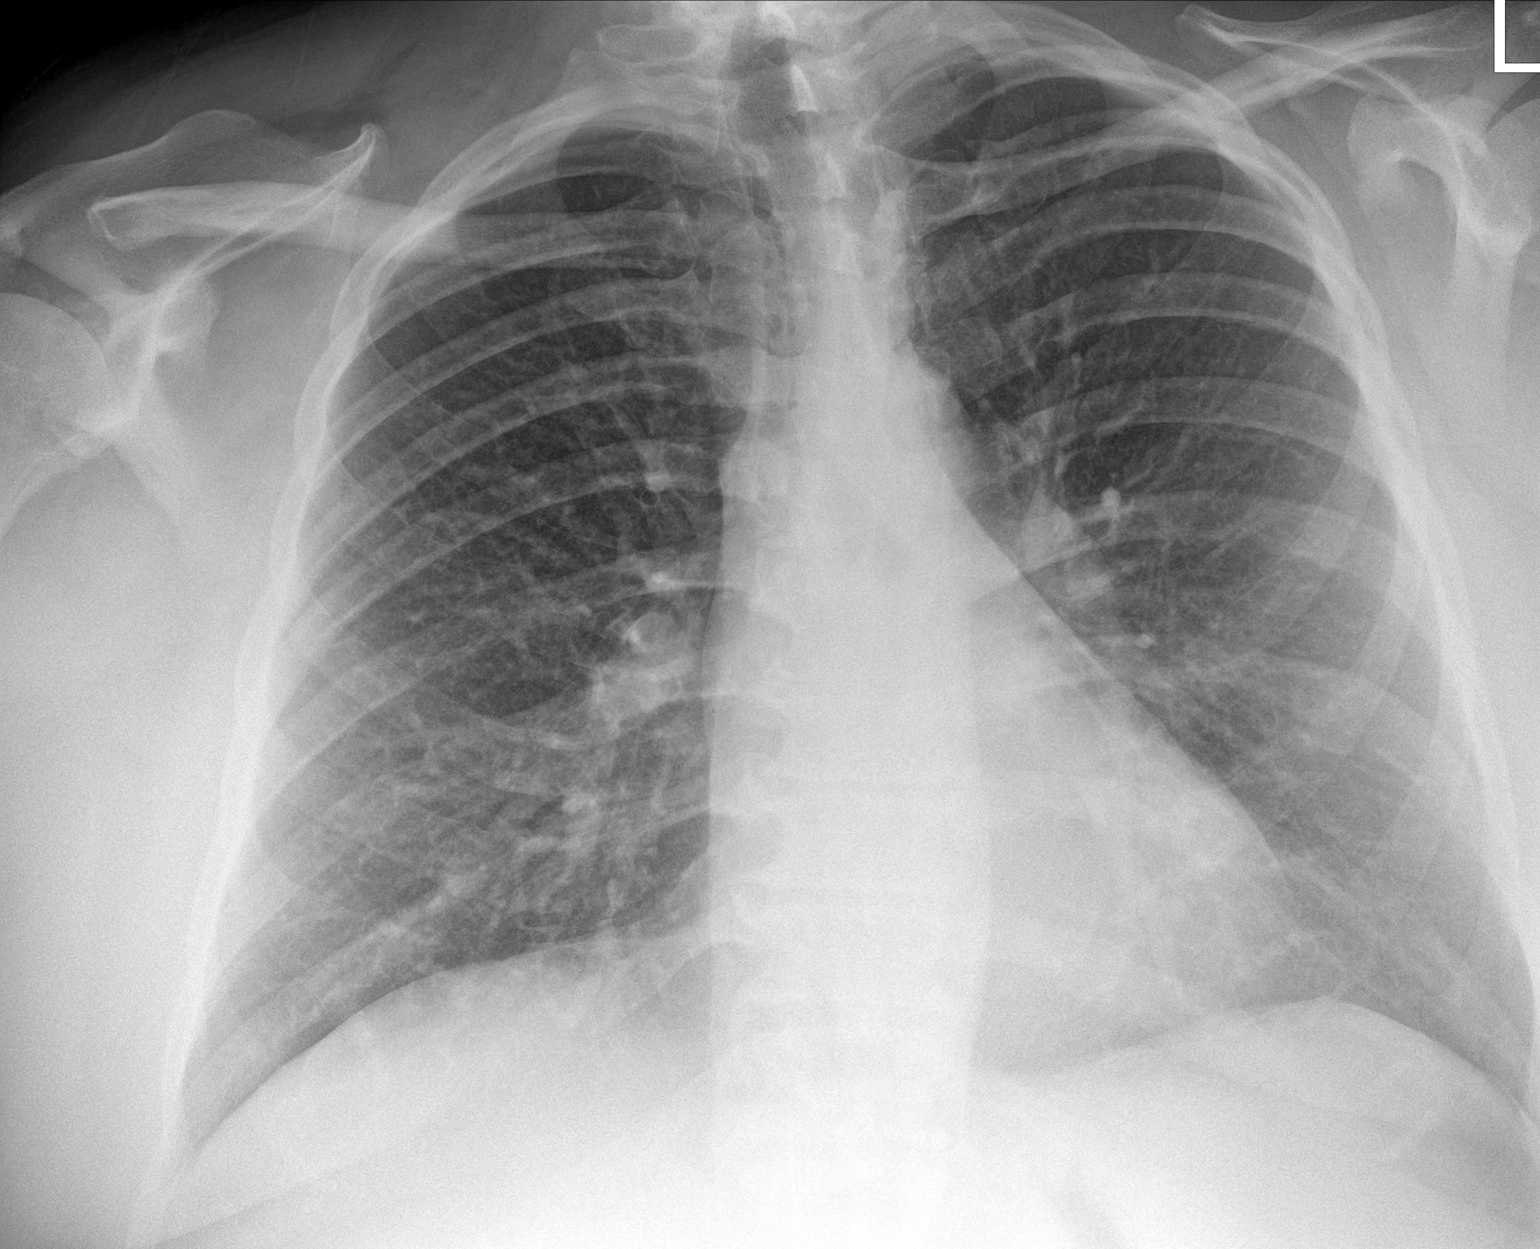

[chest lat]
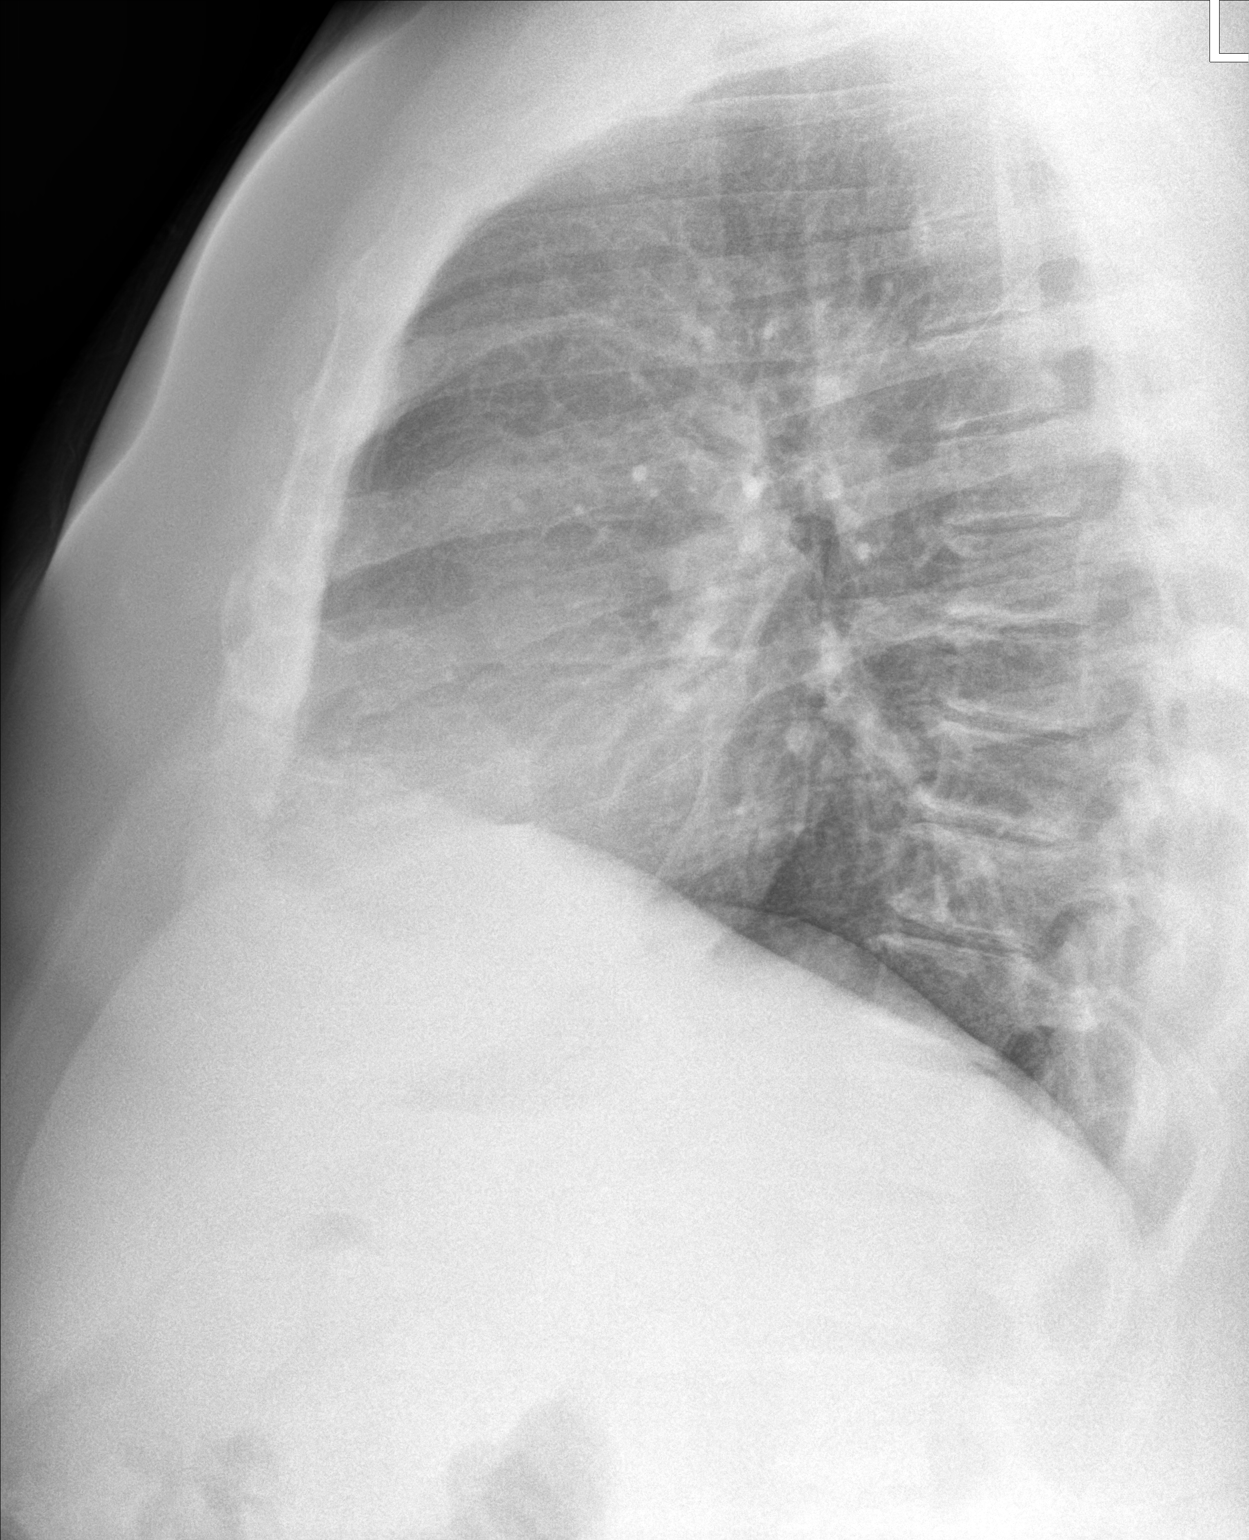

[2 of 2 positions shown; findings below may reference images not displayed]

FINDINGS: Cardiac shadow is stable. The lungs are clear bilaterally. No focal
infiltrate or sizable effusion is seen. No bony abnormality is
noted.
IMPRESSION: No active cardiopulmonary disease.

## 2021-01-02 ENCOUNTER — Other Ambulatory Visit: Payer: Self-pay | Admitting: Family Medicine

## 2021-01-02 DIAGNOSIS — F411 Generalized anxiety disorder: Secondary | ICD-10-CM

## 2021-01-02 DIAGNOSIS — M5416 Radiculopathy, lumbar region: Secondary | ICD-10-CM

## 2021-01-03 ENCOUNTER — Other Ambulatory Visit: Payer: Self-pay | Admitting: Family Medicine

## 2021-01-03 DIAGNOSIS — F411 Generalized anxiety disorder: Secondary | ICD-10-CM

## 2021-01-05 ENCOUNTER — Other Ambulatory Visit: Payer: Self-pay | Admitting: Family Medicine

## 2021-01-05 DIAGNOSIS — J418 Mixed simple and mucopurulent chronic bronchitis: Secondary | ICD-10-CM

## 2021-01-05 DIAGNOSIS — E039 Hypothyroidism, unspecified: Secondary | ICD-10-CM

## 2021-01-08 ENCOUNTER — Other Ambulatory Visit: Payer: Self-pay | Admitting: *Deleted

## 2021-01-08 ENCOUNTER — Other Ambulatory Visit: Payer: Self-pay | Admitting: Family Medicine

## 2021-01-08 DIAGNOSIS — K219 Gastro-esophageal reflux disease without esophagitis: Secondary | ICD-10-CM

## 2021-01-08 DIAGNOSIS — J418 Mixed simple and mucopurulent chronic bronchitis: Secondary | ICD-10-CM

## 2021-01-08 DIAGNOSIS — F411 Generalized anxiety disorder: Secondary | ICD-10-CM

## 2021-01-08 MED ORDER — SYMBICORT 160-4.5 MCG/ACT IN AERO: INHALATION_SPRAY | RESPIRATORY_TRACT | 0 refills | Status: DC

## 2021-01-09 ENCOUNTER — Other Ambulatory Visit: Payer: Self-pay | Admitting: Family Medicine

## 2021-01-09 ENCOUNTER — Encounter: Payer: Self-pay | Admitting: *Deleted

## 2021-01-09 DIAGNOSIS — F411 Generalized anxiety disorder: Secondary | ICD-10-CM

## 2021-01-09 DIAGNOSIS — J418 Mixed simple and mucopurulent chronic bronchitis: Secondary | ICD-10-CM

## 2021-01-09 DIAGNOSIS — K219 Gastro-esophageal reflux disease without esophagitis: Secondary | ICD-10-CM

## 2021-01-09 MED ORDER — DEXLANSOPRAZOLE 60 MG PO CPDR: 1.0000 | DELAYED_RELEASE_CAPSULE | Freq: Every day | ORAL | 1 refills | Status: DC

## 2021-01-09 MED ORDER — SYMBICORT 160-4.5 MCG/ACT IN AERO: INHALATION_SPRAY | RESPIRATORY_TRACT | 1 refills | Status: DC

## 2021-01-09 NOTE — Telephone Encounter (Signed)
Pt called to see if Dr Darlyn Read received refill request for his Alprazolam Rx because pt has not heard anything.   Needs refills sent in to [facility] in [place] ASAP. Has been out of his medicine since Sunday.

## 2021-01-09 NOTE — Telephone Encounter (Signed)
Patient was seen 12/14/2020 for chronic follow up

## 2021-01-10 ENCOUNTER — Other Ambulatory Visit: Payer: Self-pay | Admitting: *Deleted

## 2021-01-10 MED ORDER — ALPRAZOLAM 0.5 MG PO TABS: 0.5000 mg | ORAL_TABLET | Freq: Three times a day (TID) | ORAL | 0 refills | Status: DC | PRN

## 2021-01-15 ENCOUNTER — Encounter (HOSPITAL_COMMUNITY): Payer: Self-pay | Admitting: *Deleted

## 2021-01-15 ENCOUNTER — Emergency Department (HOSPITAL_COMMUNITY)
Admission: EM | Admit: 2021-01-15 | Discharge: 2021-01-15 | Disposition: A | Discharge status: Home / Self Care | Payer: Medicare HMO | Attending: Emergency Medicine | Admitting: Emergency Medicine

## 2021-01-15 ENCOUNTER — Other Ambulatory Visit: Payer: Self-pay

## 2021-01-15 ENCOUNTER — Emergency Department (HOSPITAL_COMMUNITY): Payer: Medicare HMO

## 2021-01-15 DIAGNOSIS — R079 Chest pain, unspecified: Secondary | ICD-10-CM | POA: Diagnosis not present

## 2021-01-15 DIAGNOSIS — R112 Nausea with vomiting, unspecified: Secondary | ICD-10-CM | POA: Diagnosis not present

## 2021-01-15 DIAGNOSIS — R0789 Other chest pain: Secondary | ICD-10-CM | POA: Diagnosis not present

## 2021-01-15 DIAGNOSIS — J45909 Unspecified asthma, uncomplicated: Secondary | ICD-10-CM | POA: Insufficient documentation

## 2021-01-15 DIAGNOSIS — I10 Essential (primary) hypertension: Secondary | ICD-10-CM | POA: Insufficient documentation

## 2021-01-15 DIAGNOSIS — E039 Hypothyroidism, unspecified: Secondary | ICD-10-CM | POA: Insufficient documentation

## 2021-01-15 DIAGNOSIS — F17210 Nicotine dependence, cigarettes, uncomplicated: Secondary | ICD-10-CM | POA: Insufficient documentation

## 2021-01-15 HISTORY — DX: Pure hypercholesterolemia, unspecified: E78.00

## 2021-01-15 HISTORY — DX: Unspecified asthma, uncomplicated: J45.909

## 2021-01-15 LAB — CBC WITH DIFFERENTIAL/PLATELET
Abs Immature Granulocytes: 0.04 10*3/uL (ref 0.00–0.07)
Basophils Absolute: 0 10*3/uL (ref 0.0–0.1)
Basophils Relative: 0 %
Eosinophils Absolute: 0.1 10*3/uL (ref 0.0–0.5)
Eosinophils Relative: 1 %
HCT: 42.6 % (ref 39.0–52.0)
Hemoglobin: 14.5 g/dL (ref 13.0–17.0)
Immature Granulocytes: 1 %
Lymphocytes Relative: 23 %
Lymphs Abs: 1.9 10*3/uL (ref 0.7–4.0)
MCH: 31.3 pg (ref 26.0–34.0)
MCHC: 34 g/dL (ref 30.0–36.0)
MCV: 91.8 fL (ref 80.0–100.0)
Monocytes Absolute: 0.6 10*3/uL (ref 0.1–1.0)
Monocytes Relative: 7 %
Neutro Abs: 5.6 10*3/uL (ref 1.7–7.7)
Neutrophils Relative %: 68 %
Platelets: 234 10*3/uL (ref 150–400)
RBC: 4.64 MIL/uL (ref 4.22–5.81)
RDW: 13.2 % (ref 11.5–15.5)
WBC: 8.3 10*3/uL (ref 4.0–10.5)
nRBC: 0 % (ref 0.0–0.2)

## 2021-01-15 LAB — COMPREHENSIVE METABOLIC PANEL
ALT: 24 U/L (ref 0–44)
AST: 23 U/L (ref 15–41)
Albumin: 4.2 g/dL (ref 3.5–5.0)
Alkaline Phosphatase: 60 U/L (ref 38–126)
Anion gap: 13 (ref 5–15)
BUN: 13 mg/dL (ref 6–20)
CO2: 24 mmol/L (ref 22–32)
Calcium: 9.8 mg/dL (ref 8.9–10.3)
Chloride: 97 mmol/L — ABNORMAL LOW (ref 98–111)
Creatinine, Ser: 0.77 mg/dL (ref 0.61–1.24)
GFR, Estimated: >60 mL/min (ref >60)
Glucose, Bld: 97 mg/dL (ref 70–99)
Potassium: 3.7 mmol/L (ref 3.5–5.1)
Sodium: 134 mmol/L — ABNORMAL LOW (ref 135–145)
Total Bilirubin: 1 mg/dL (ref 0.3–1.2)
Total Protein: 7.5 g/dL (ref 6.5–8.1)

## 2021-01-15 LAB — TROPONIN I (HIGH SENSITIVITY)
Troponin I (High Sensitivity): 4 ng/L (ref <18)
Troponin I (High Sensitivity): 4 ng/L (ref <18)

## 2021-01-15 LAB — LIPASE, BLOOD: Lipase: 36 U/L (ref 11–51)

## 2021-01-15 MED ORDER — ONDANSETRON 4 MG PO TBDP: 4.0000 mg | ORAL_TABLET | Freq: Three times a day (TID) | ORAL | 0 refills | Status: DC | PRN

## 2021-01-15 MED ORDER — ONDANSETRON HCL 4 MG/2ML IJ SOLN
4.0000 mg | Freq: Once | INTRAMUSCULAR | Status: AC
  Administered 2021-01-15: 4 mg via INTRAVENOUS
  Filled 2021-01-15: qty 2

## 2021-01-15 MED ORDER — PANTOPRAZOLE SODIUM 40 MG PO TBEC: 40.0000 mg | DELAYED_RELEASE_TABLET | Freq: Every day | ORAL | 0 refills | Status: DC

## 2021-01-15 MED ORDER — PANTOPRAZOLE SODIUM 40 MG IV SOLR
40.0000 mg | Freq: Once | INTRAVENOUS | Status: AC
  Administered 2021-01-15: 40 mg via INTRAVENOUS
  Filled 2021-01-15: qty 40

## 2021-01-15 MED ORDER — ALUM & MAG HYDROXIDE-SIMETH 200-200-20 MG/5ML PO SUSP
30.0000 mL | Freq: Once | ORAL | Status: AC
  Administered 2021-01-15: 30 mL via ORAL
  Filled 2021-01-15: qty 30

## 2021-01-15 NOTE — ED Provider Notes (Signed)
Emergency Department Provider Note   I have reviewed the triage vital signs and the nursing notes.   HISTORY  Chief Complaint Chest Pain   HPI Roy [name] is a 50 y.o. male with past medical history reviewed below presents to the emergency department with burning central chest discomfort  over the past 4 to 5 days.  He has a history of GERD and notes that it feels similar to prior.  He experienced 6 episodes of vomiting over the weekend.  He has been trying Tums with no relief in symptoms.  He states his primary care doctor has been managing him as an outpatient and prescribed Dexilant but he has not been able to afford this medication.  He is not having shortness of breath, dizziness/lightheadedness. No fever or chills.    Past Medical History:  Diagnosis Date  . Anxiety   . Asthma   . Depression   . High cholesterol   . Hypertension   . Low testosterone   . Median nerve dysfunction   . Radiculopathy of cervical region   . Thyroid disease   . Ulnar neuropathy at elbow   . Weakness     Patient Active Problem List   Diagnosis Date Noted  . Asthma 01/22/2019  . GAD (generalized anxiety disorder) 01/06/2019  . GERD (gastroesophageal reflux disease) 12/23/2017  . Headache 01/22/2016  . Nicotine abuse 12/21/2015  . Benign essential HTN 12/21/2015  . Hypothyroid 12/21/2015  . Morbid obesity (HCC) 10/19/2015  . Nontraumatic tear of right rotator cuff 06/04/2014  . Cervical radiculopathy 08/11/2013  . Lumbar radiculopathy 08/11/2013  . 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 Hypotestosteronism 03/29/2013    Past Surgical History:  Procedure Laterality Date  . BICEPS TENDON REPAIR    . NECK SURGERY     Fusion done two seperate times  . ROTATOR CUFF REPAIR Right     Allergies Penicillins and Chantix [varenicline]  Family History  Problem Relation Age of Onset  . Hypertension Mother   . Stroke Mother 110  . Arrhythmia Father   . COPD Father   . Asthma Brother   .  Bipolar disorder Brother   . Asthma Daughter   . Fibromyalgia Sister     Social History Social History   Tobacco Use  . Smoking status: Current Every Day Smoker    Packs/day: 0.50    Years: 30.00    Pack years: 15.00    Types: Cigarettes    Last attempt to quit: 07/23/2017    Years since quitting: 3.4  . Smokeless tobacco: Former Neurosurgeon    Types: Snuff, Chew  Vaping Use  . Vaping Use: Never used  Substance Use Topics  . Alcohol use: No  . Drug use: No    Review of Systems  Constitutional: No fever/chills. Eyes: No visual changes. ENT: No sore throat. Cardiovascular: Burning chest pain. Respiratory: Denies shortness of breath. Gastrointestinal: No abdominal pain. Positive nausea and vomiting. No diarrhea. No constipation. Genitourinary: Negative for dysuria. Musculoskeletal: Negative for back pain. Skin: Negative for rash. Neurological: Negative for headaches, focal weakness or numbness.  10-point ROS otherwise negative.  ____________________________________________   PHYSICAL EXAM:  VITAL SIGNS: ED Triage Vitals  Enc Vitals Group     BP 01/15/21 1051 137/80     Pulse Rate 01/15/21 1051 76     Resp 01/15/21 1051 20     Temp 01/15/21 1051 98.1 F (36.7 C)     Temp Source 01/15/21 1051 Oral  SpO2 01/15/21 1051 97 %     Weight 01/15/21 1049 (!) 340 lb (154.2 kg)     Height 01/15/21 1049 5\' 8"  (1.727 m)   Constitutional: Alert and oriented. Well appearing and in no acute distress. Eyes: Conjunctivae are normal. Head: Atraumatic. Nose: No congestion/rhinnorhea. Mouth/Throat: Mucous membranes are moist. Neck: No stridor.  Cardiovascular: Normal rate, regular rhythm. Good peripheral circulation. Grossly normal heart sounds.   Respiratory: Normal respiratory effort.  No retractions. Lungs CTAB. Gastrointestinal: Soft with no abdominal tenderness. No RUQ tenderness. No distention.  Musculoskeletal: No gross deformities of extremities. Neurologic:  Normal  speech and language.  Skin:  Skin is warm, dry and intact. No rash noted.  ____________________________________________   LABS (all labs ordered are listed, but only abnormal results are displayed)  Labs Reviewed  COMPREHENSIVE METABOLIC PANEL - Abnormal; Notable for the following components:      Result Value   Sodium 134 (*)    Chloride 97 (*)    All other components within normal limits  LIPASE, BLOOD  CBC WITH DIFFERENTIAL/PLATELET  TROPONIN I (HIGH SENSITIVITY)  TROPONIN I (HIGH SENSITIVITY)   ____________________________________________  EKG   EKG Interpretation  Date/Time:  Monday January 15 2021 11:00:31 EDT Ventricular Rate:  79 PR Interval:  149 QRS Duration: 93 QT Interval:  395 QTC Calculation: 453 R Axis:   -18 Text Interpretation: Sinus rhythm Borderline left axis deviation Borderline low voltage, extremity leads Nonspecific T abnormalities, inferior leads Confirmed by [id number] [phone]) on 01/15/2021 11:37:46 AM       ____________________________________________  RADIOLOGY  DG Chest Portable 1 View  Result Date: 01/15/2021 CLINICAL DATA:  Chest pain EXAM: PORTABLE CHEST 1 VIEW COMPARISON:  December 14, 2020 FINDINGS: Lungs are clear. Heart size and pulmonary vascularity are normal. No adenopathy. No pneumothorax. No bone lesions. IMPRESSION: No edema or airspace opacity. Cardiac silhouette within normal limits. Electronically Signed   By: December 16, 2020 III M.D.   On: 01/15/2021 11:24    ____________________________________________   PROCEDURES  Procedure(s) performed:   Procedures   ____________________________________________   INITIAL IMPRESSION / ASSESSMENT AND PLAN / ED COURSE  Pertinent labs & imaging results that were available during my care of the patient were reviewed by me and considered in my medical decision making (see chart for details).   Patient presents emergency department with atypical chest pain not able to afford his  prescription for Dexilant prescribed by his PCP.  Low suspicion for ACS clinically.  Pain is very atypical.  EKG is overall reassuring and interpreted by me as above. Suspect GI related pain such as gastritis/esophagitis.   Labs reviewed. Troponin negative. CXR without acute process. Plan for discharge on Protonix and Zofran PRN nausea. Patient to follow with his PCP and GI in the coming 1-2 weeks or return to the ED with any new or worsening pain.    ____________________________________________  FINAL CLINICAL IMPRESSION(S) / ED DIAGNOSES  Final diagnoses:  Atypical chest pain  Non-intractable vomiting with nausea, unspecified vomiting type     MEDICATIONS GIVEN DURING THIS VISIT:  Medications  ondansetron (ZOFRAN) injection 4 mg (4 mg Intravenous Given 01/15/21 1121)  alum & mag hydroxide-simeth (MAALOX/MYLANTA) 200-200-20 MG/5ML suspension 30 mL (30 mLs Oral Given 01/15/21 1123)  pantoprazole (PROTONIX) injection 40 mg (40 mg Intravenous Given 01/15/21 1122)     NEW OUTPATIENT MEDICATIONS STARTED DURING THIS VISIT:  Discharge Medication List as of 01/15/2021  2:50 PM    START taking these medications   Details  ondansetron (ZOFRAN ODT) 4 MG disintegrating tablet Take 1 tablet (4 mg total) by mouth every 8 (eight) hours as needed., Starting Mon 01/15/2021, Normal    pantoprazole (PROTONIX) 40 MG tablet Take 1 tablet (40 mg total) by mouth daily., Starting Mon 01/15/2021, Until Wed 02/14/2021, Normal        Note:  This document was prepared using Dragon voice recognition software and may include unintentional dictation errors.  Alona Bene, MD, [facility]    Jurnee Nakayama, Arlyss Repress, MD 01/16/21 [phone]

## 2021-01-15 NOTE — ED Triage Notes (Signed)
Pt c/o mid chest pain/burning since Thursday. Pt reports he was given a prescription for Dexilant but his insurance doesn't cover it so he hasn't taken it. Pt also reports vomiting x 6 since Saturday. He has taken an entire bottle of Tums with no relief. Denis SOB, dizziness/lightheadedness.

## 2021-01-15 NOTE — Discharge Instructions (Signed)

## 2021-01-18 ENCOUNTER — Encounter: Payer: Self-pay | Admitting: Family Medicine

## 2021-01-19 ENCOUNTER — Ambulatory Visit (INDEPENDENT_AMBULATORY_CARE_PROVIDER_SITE_OTHER): Payer: Medicare HMO | Admitting: Family Medicine

## 2021-01-19 ENCOUNTER — Other Ambulatory Visit: Payer: Self-pay

## 2021-01-19 ENCOUNTER — Ambulatory Visit (INDEPENDENT_AMBULATORY_CARE_PROVIDER_SITE_OTHER): Payer: Medicare HMO

## 2021-01-19 ENCOUNTER — Encounter: Payer: Self-pay | Admitting: Family Medicine

## 2021-01-19 VITALS — BP 126/76 | HR 68 | Temp 97.4°F | Resp 20 | Ht 68.0 in | Wt 350.0 lb

## 2021-01-19 DIAGNOSIS — R1032 Left lower quadrant pain: Secondary | ICD-10-CM | POA: Diagnosis not present

## 2021-01-19 DIAGNOSIS — K279 Peptic ulcer, site unspecified, unspecified as acute or chronic, without hemorrhage or perforation: Secondary | ICD-10-CM | POA: Diagnosis not present

## 2021-01-19 DIAGNOSIS — R61 Generalized hyperhidrosis: Secondary | ICD-10-CM

## 2021-01-19 DIAGNOSIS — R109 Unspecified abdominal pain: Secondary | ICD-10-CM | POA: Diagnosis not present

## 2021-01-19 LAB — URINALYSIS
Bilirubin, UA: NEGATIVE
Glucose, UA: NEGATIVE
Ketones, UA: NEGATIVE
Leukocytes,UA: NEGATIVE
Nitrite, UA: NEGATIVE
Protein,UA: NEGATIVE
RBC, UA: NEGATIVE
Specific Gravity, UA: 1.02 (ref 1.005–1.030)
Urobilinogen, Ur: 2 mg/dL — ABNORMAL HIGH (ref 0.2–1.0)
pH, UA: 6.5 (ref 5.0–7.5)

## 2021-01-19 NOTE — Progress Notes (Signed)
Subjective:  Patient ID: Roy [name], male    DOB: 1970-11-04  Age: 50 y.o. MRN: [id number]  CC: er follow up  (AP = GERD and Sweats )   HPI Roy [name] presents for E.D> follow up. Went four days ago for substernal burning. Heart tests were negative. Was prescribed protonix because he thought insurance wouldn't cover the [place]. However, when he went to the pharmacy, he was able to get the dexilant. The night after the E.D. trip he started having soaking night sweats. Temp was 99.x. Vomiting with black emesis prior to the E.D. trip. Stopped with resumption of PPI. Had been taking frequent BC powders for HA.  Depression screen [facility] 2/9 01/19/2021 12/14/2020 11/14/2020  Decreased Interest 0 2 2  Down, Depressed, Hopeless 0 1 1  PHQ - 2 Score 0 3 3  Altered sleeping - 1 1  Tired, decreased energy - 2 2  Change in appetite - 3 1  Feeling bad or failure about yourself  - 0 0  Trouble concentrating - 0 0  Moving slowly or fidgety/restless - 0 0  Suicidal thoughts - 0 0  PHQ-9 Score - 9 7  Difficult doing work/chores - Somewhat difficult Somewhat difficult  Some recent data might be hidden    History Roy [name] has a past medical history of Anxiety, Asthma, Depression, High cholesterol, Hypertension, Low testosterone, Median nerve dysfunction, Radiculopathy of cervical region, Thyroid disease, Ulnar neuropathy at elbow, and Weakness.   He has a past surgical history that includes Rotator cuff repair (Right); Biceps tendon repair; and Neck surgery.   His family history includes Arrhythmia in his father; Asthma in his brother and daughter; Bipolar disorder in his brother; COPD in his father; Fibromyalgia in his sister; Hypertension in his mother; Stroke (age of onset: 62) in his mother.He reports that he has been smoking cigarettes. He has a 15.00 pack-year smoking history. He has quit using smokeless tobacco.  His smokeless tobacco use included snuff and chew. He reports that he does not  drink alcohol and does not use drugs.    ROS Review of Systems  Constitutional: Negative for chills, diaphoresis, fever and unexpected weight change.  HENT: Negative for rhinorrhea and trouble swallowing.   Respiratory: Negative for cough, chest tightness and shortness of breath.   Cardiovascular: Negative for chest pain.  Gastrointestinal: Positive for abdominal pain (left inguinal, and epigastric.). Negative for abdominal distention, blood in stool, constipation, diarrhea, nausea, rectal pain and vomiting.  Genitourinary: Negative for dysuria, flank pain and hematuria.  Musculoskeletal: Negative for arthralgias and joint swelling.  Skin: Negative for rash.  Neurological: Negative for syncope and headaches.    Objective:  BP 126/76   Pulse 68   Temp (!) 97.4 F (36.3 C)   Resp 20   Ht _0  (1.727 m)   Wt (!) 350 lb (158.8 kg)   SpO2 97%   BMI 53.22 kg/m   BP Readings from Last 3 Encounters:  01/19/21 126/76  01/15/21 124/80  12/14/20 111/69    Wt Readings from Last 3 Encounters:  01/19/21 (!) 350 lb (158.8 kg)  01/15/21 (!) 340 lb (154.2 kg)  12/14/20 (!) 354 lb (160.6 kg)     Physical Exam Vitals reviewed.  Constitutional:      Appearance: He is well-developed.  HENT:     Head: Normocephalic and atraumatic.     Right Ear: Tympanic membrane and external ear normal. No decreased hearing noted.     Left Ear: Tympanic  membrane and external ear normal. No decreased hearing noted.     Mouth/Throat:     Pharynx: No oropharyngeal exudate or posterior oropharyngeal erythema.  Eyes:     Pupils: Pupils are equal, round, and reactive to light.  Cardiovascular:     Rate and Rhythm: Normal rate and regular rhythm.     Heart sounds: No murmur heard.   Pulmonary:     Effort: No respiratory distress.     Breath sounds: Normal breath sounds.  Abdominal:     General: Bowel sounds are normal.     Palpations: Abdomen is soft. There is no mass.     Tenderness: There is  abdominal tenderness (just above midportion of left inguinal ligament).  Musculoskeletal:     Cervical back: Normal range of motion and neck supple.     Results for orders placed or performed in visit on 01/19/21  Urinalysis  Result Value Ref Range   Specific Gravity, UA 1.020 1.005 - 1.030   pH, UA 6.5 5.0 - 7.5   Color, UA Yellow Yellow   Appearance Ur Clear Clear   Leukocytes,UA Negative Negative   Protein,UA Negative Negative/Trace   Glucose, UA Negative Negative   Ketones, UA Negative Negative   RBC, UA Negative Negative   Bilirubin, UA Negative Negative   Urobilinogen, Ur 2.0 (H) 0.2 - 1.0 mg/dL   Nitrite, UA Negative Negative  CBC with Differential/Platelet  Result Value Ref Range   WBC 6.1 3.4 - 10.8 x10E3/uL   RBC 4.43 4.14 - 5.80 x10E6/uL   Hemoglobin 13.5 13.0 - 17.7 g/dL   Hematocrit 40.5 37.5 - 51.0 %   MCV 91 79 - 97 fL   MCH 30.5 26.6 - 33.0 pg   MCHC 33.3 31.5 - 35.7 g/dL   RDW 13.0 11.6 - 15.4 %   Platelets 298 150 - 450 x10E3/uL   Neutrophils 60 Not Estab. %   Lymphs 30 Not Estab. %   Monocytes 6 Not Estab. %   Eos 2 Not Estab. %   Basos 1 Not Estab. %   Neutrophils Absolute 3.7 1.4 - 7.0 x10E3/uL   Lymphocytes Absolute 1.8 0.7 - 3.1 x10E3/uL   Monocytes Absolute 0.4 0.1 - 0.9 x10E3/uL   EOS (ABSOLUTE) 0.1 0.0 - 0.4 x10E3/uL   Basophils Absolute 0.1 0.0 - 0.2 x10E3/uL   Immature Granulocytes 1 Not Estab. %   Immature Grans (Abs) 0.0 0.0 - 0.1 x10E3/uL  CMP14+EGFR  Result Value Ref Range   Glucose 114 (H) 65 - 99 mg/dL   BUN 10 6 - 24 mg/dL   Creatinine, Ser 0.84 0.76 - 1.27 mg/dL   eGFR 108 >59 mL/min/1.73   BUN/Creatinine Ratio 12 9 - 20   Sodium 138 134 - 144 mmol/L   Potassium 4.4 3.5 - 5.2 mmol/L   Chloride 97 96 - 106 mmol/L   CO2 25 20 - 29 mmol/L   Calcium 9.4 8.7 - 10.2 mg/dL   Total Protein 6.8 6.0 - 8.5 g/dL   Albumin 4.5 4.0 - 5.0 g/dL   Globulin, Total 2.3 1.5 - 4.5 g/dL   Albumin/Globulin Ratio 2.0 1.2 - 2.2   Bilirubin Total  0.3 0.0 - 1.2 mg/dL   Alkaline Phosphatase 82 44 - 121 IU/L   AST 21 0 - 40 IU/L   ALT 23 0 - 44 IU/L  Testosterone,Free and Total  Result Value Ref Range   Testosterone 214 (L) 264 - 916 ng/dL   Testosterone, Free 6.2 (L) 6.8 -  21.5 pg/mL  TSH + free T4  Result Value Ref Range   TSH 2.350 0.450 - 4.500 uIU/mL   Free T4 1.12 0.82 - 1.77 ng/dL     Assessment & Plan:   Roy [name] was seen today for er follow up .  Diagnoses and all orders for this visit:  LLQ abdominal pain -     Urinalysis -     Urine Culture -     CBC with Differential/Platelet -     CMP14+EGFR -     DG Abd 2 Views; Future  PUD (peptic ulcer disease)  Unexplained night sweats -     Testosterone,Free and Total -     TSH + free T4       I am having Roy [name] maintain his acetaminophen, sildenafil, celecoxib, gabapentin, linaclotide, fexofenadine-pseudoephedrine, QUEtiapine, Vitamin D (Ergocalciferol), gemfibrozil, albuterol, carvedilol, levothyroxine, traZODone, levofloxacin, DULoxetine, Symbicort, dexlansoprazole, ALPRAZolam, ondansetron, and pantoprazole.  Allergies as of 01/19/2021      Reactions   Penicillins Rash, Other (See Comments)   Has patient had a PCN reaction causing immediate rash, facial/tongue/throat swelling, SOB or lightheadedness with hypotension: Yes Has patient had a PCN reaction causing severe rash involving mucus membranes or skin necrosis: No Has patient had a PCN reaction that required hospitalization: No Has patient had a PCN reaction occurring within the last 10 years: No If all of the above answers are "NO", then may proceed with Cephalosporin use. Has patient had a PCN reaction causing immediate rash, facial/tongue/throat swelling, SOB or lightheadedness with hypotension: Yes Has patient had a PCN reaction causing severe rash involving mucus membranes or skin necrosis: No Has patient had a PCN reaction that required hospitalization: No Has patient had a PCN reaction  occurring within the last 10 years: No If all of the above answers are "NO", then may proceed with Cephalosporin use. unknown   Chantix [varenicline] Other (See Comments)   Severe headache      Medication List       Accurate as of January 19, 2021 11:59 PM. If you have any questions, ask your nurse or doctor.        acetaminophen 500 MG tablet Commonly known as: TYLENOL Take 1,000 mg by mouth every 6 (six) hours as needed for headache (pain).   albuterol 108 (90 Base) MCG/ACT inhaler Commonly known as: VENTOLIN HFA Inhale 1-2 puffs into the lungs every 6 (six) hours as needed for wheezing or shortness of breath.   ALPRAZolam 0.5 MG tablet Commonly known as: XANAX Take 0.5 mg by mouth 3 (three) times daily as needed.   carvedilol 6.25 MG tablet Commonly known as: COREG Take 1 tablet (6.25 mg total) by mouth 2 (two) times daily with a meal.   celecoxib 200 MG capsule Commonly known as: CeleBREX Take 1 capsule (200 mg total) by mouth daily. For arthritis   dexlansoprazole 60 MG capsule Commonly known as: DEXILANT Take 1 capsule (60 mg total) by mouth daily.   DULoxetine 60 MG capsule Commonly known as: CYMBALTA Take 2 capsules by mouth once daily   fexofenadine-pseudoephedrine 180-240 MG 24 hr tablet Commonly known as: ALLEGRA-D 24 Take 1 tablet by mouth every evening. For allergy and congestion   gabapentin 300 MG capsule Commonly known as: NEURONTIN Take 1 capsule (300 mg total) by mouth 3 (three) times daily.   gemfibrozil 600 MG tablet Commonly known as: LOPID Take 1 tablet by mouth twice daily   levofloxacin 500 MG tablet Commonly known as: LEVAQUIN Take  1 tablet (500 mg total) by mouth daily.   levothyroxine 50 MCG tablet Commonly known as: SYNTHROID Take 1 tablet (50 mcg total) by mouth daily.   linaclotide 290 MCG Caps capsule Commonly known as: LINZESS Take 1 capsule (290 mcg total) by mouth daily as needed (constipation).   ondansetron 4 MG  disintegrating tablet Commonly known as: Zofran ODT Take 1 tablet (4 mg total) by mouth every 8 (eight) hours as needed.   pantoprazole 40 MG tablet Commonly known as: PROTONIX Take 1 tablet (40 mg total) by mouth daily.   QUEtiapine 200 MG tablet Commonly known as: SEROQUEL TAKE 1 TABLET BY MOUTH AT BEDTIME FOR  NERVES   sildenafil 20 MG tablet Commonly known as: REVATIO Take 2-5 tablets daily as needed for sex   Symbicort 160-4.5 MCG/ACT inhaler Generic drug: budesonide-formoterol INHALE 2 PUFFS BY MOUTH TWICE DAILY FOR ASTHMA   traZODone 100 MG tablet Commonly known as: DESYREL TAKE 1 TABLET BY MOUTH AT BEDTIME   Vitamin D (Ergocalciferol) 1.25 MG (50000 UNIT) Caps capsule Commonly known as: DRISDOL Take 1 capsule (50,000 Units total) by mouth every 7 (seven) days.        Follow-up: Return in about 2 weeks (around 02/02/2021).  Claretta Fraise, M.D.

## 2021-01-20 ENCOUNTER — Encounter: Payer: Self-pay | Admitting: Family Medicine

## 2021-01-20 LAB — CBC WITH DIFFERENTIAL/PLATELET
Basophils Absolute: 0.1 10*3/uL (ref 0.0–0.2)
Basos: 1 %
EOS (ABSOLUTE): 0.1 10*3/uL (ref 0.0–0.4)
Eos: 2 %
Hematocrit: 40.5 % (ref 37.5–51.0)
Hemoglobin: 13.5 g/dL (ref 13.0–17.7)
Immature Grans (Abs): 0 10*3/uL (ref 0.0–0.1)
Immature Granulocytes: 1 %
Lymphocytes Absolute: 1.8 10*3/uL (ref 0.7–3.1)
Lymphs: 30 %
MCH: 30.5 pg (ref 26.6–33.0)
MCHC: 33.3 g/dL (ref 31.5–35.7)
MCV: 91 fL (ref 79–97)
Monocytes Absolute: 0.4 10*3/uL (ref 0.1–0.9)
Monocytes: 6 %
Neutrophils Absolute: 3.7 10*3/uL (ref 1.4–7.0)
Neutrophils: 60 %
Platelets: 298 10*3/uL (ref 150–450)
RBC: 4.43 x10E6/uL (ref 4.14–5.80)
RDW: 13 % (ref 11.6–15.4)
WBC: 6.1 10*3/uL (ref 3.4–10.8)

## 2021-01-20 LAB — CMP14+EGFR
ALT: 23 IU/L (ref 0–44)
AST: 21 IU/L (ref 0–40)
Albumin/Globulin Ratio: 2 (ref 1.2–2.2)
Albumin: 4.5 g/dL (ref 4.0–5.0)
Alkaline Phosphatase: 82 IU/L (ref 44–121)
BUN/Creatinine Ratio: 12 (ref 9–20)
BUN: 10 mg/dL (ref 6–24)
Bilirubin Total: 0.3 mg/dL (ref 0.0–1.2)
CO2: 25 mmol/L (ref 20–29)
Calcium: 9.4 mg/dL (ref 8.7–10.2)
Chloride: 97 mmol/L (ref 96–106)
Creatinine, Ser: 0.84 mg/dL (ref 0.76–1.27)
Globulin, Total: 2.3 g/dL (ref 1.5–4.5)
Glucose: 114 mg/dL — ABNORMAL HIGH (ref 65–99)
Potassium: 4.4 mmol/L (ref 3.5–5.2)
Sodium: 138 mmol/L (ref 134–144)
Total Protein: 6.8 g/dL (ref 6.0–8.5)
eGFR: 108 mL/min/{1.73_m2} (ref >59)

## 2021-01-20 LAB — TSH+FREE T4
Free T4: 1.12 ng/dL (ref 0.82–1.77)
TSH: 2.35 u[IU]/mL (ref 0.450–4.500)

## 2021-01-20 LAB — TESTOSTERONE,FREE AND TOTAL
Testosterone, Free: 6.2 pg/mL — ABNORMAL LOW (ref 6.8–21.5)
Testosterone: 214 ng/dL — ABNORMAL LOW (ref 264–916)

## 2021-01-22 ENCOUNTER — Other Ambulatory Visit: Payer: Self-pay | Admitting: *Deleted

## 2021-01-22 DIAGNOSIS — E349 Endocrine disorder, unspecified: Secondary | ICD-10-CM

## 2021-01-23 ENCOUNTER — Other Ambulatory Visit: Payer: Medicare HMO

## 2021-01-23 ENCOUNTER — Other Ambulatory Visit: Payer: Self-pay

## 2021-01-23 DIAGNOSIS — E349 Endocrine disorder, unspecified: Secondary | ICD-10-CM | POA: Diagnosis not present

## 2021-01-23 LAB — URINE CULTURE

## 2021-01-25 LAB — TESTOSTERONE,FREE AND TOTAL
Testosterone, Free: 6.1 pg/mL — ABNORMAL LOW (ref 6.8–21.5)
Testosterone: 202 ng/dL — ABNORMAL LOW (ref 264–916)

## 2021-02-08 ENCOUNTER — Other Ambulatory Visit: Payer: Self-pay | Admitting: Family Medicine

## 2021-02-08 ENCOUNTER — Telehealth: Payer: Self-pay | Admitting: Family Medicine

## 2021-02-08 DIAGNOSIS — F411 Generalized anxiety disorder: Secondary | ICD-10-CM

## 2021-02-08 MED ORDER — ALPRAZOLAM 0.5 MG PO TABS: 0.5000 mg | ORAL_TABLET | Freq: Three times a day (TID) | ORAL | 0 refills | Status: DC | PRN

## 2021-02-08 NOTE — Telephone Encounter (Signed)
Patient aware.  Patient has appointment for followup on 03/13/21 so we cancelled appointment for med refill on 02/12/21 since he will have enough medication to get to appointment in June.

## 2021-02-08 NOTE — Telephone Encounter (Signed)
Patient needs refill of Xanax.  Has an appointment on Monday but will be out of medication tomorrow.  Last office visit 01/19/21 Last refill 01/09/21, #30, no refills [facility] [place]

## 2021-02-08 NOTE — Telephone Encounter (Signed)
  Prescription Request  02/08/2021  What is the name of the medication or equipment? XANAX ALPRAZOLAM  Have you contacted your pharmacy to request a refill? (if applicable) YES  Which pharmacy would you like this sent to? [facility] [place] pt was seen last month by Dr. Darlyn Read is not due to come back to see him until June 21 for 3 month f/u and has appt set up    Patient notified that their request is being sent to the clinical staff for review and that they should receive a response within 2 business days.

## 2021-02-08 NOTE — Telephone Encounter (Signed)
Please let the patient know that I sent their prescription to their pharmacy. Thanks, WS 

## 2021-02-12 ENCOUNTER — Ambulatory Visit: Payer: Medicare HMO | Admitting: Family Medicine

## 2021-02-16 ENCOUNTER — Other Ambulatory Visit: Payer: Self-pay | Admitting: Family Medicine

## 2021-02-16 DIAGNOSIS — F411 Generalized anxiety disorder: Secondary | ICD-10-CM

## 2021-02-16 NOTE — Telephone Encounter (Signed)
Last office visit 01/19/21 Last refill 11/14/20, #30, 2 refills

## 2021-03-07 ENCOUNTER — Encounter: Payer: Self-pay | Admitting: Family Medicine

## 2021-03-08 ENCOUNTER — Telehealth: Payer: Self-pay | Admitting: Family Medicine

## 2021-03-08 NOTE — Telephone Encounter (Signed)
Pt called and aware- these will be filled at the [facility] on 21st

## 2021-03-08 NOTE — Telephone Encounter (Signed)
  Prescription Request  03/08/2021  What is the name of the medication or equipment? xanax  Have you contacted your pharmacy to request a refill? (if applicable) yes  Which pharmacy would you like this sent to? [facility] in [place]   Patient notified that their request is being sent to the clinical staff for review and that they should receive a response within 2 business days.

## 2021-03-10 ENCOUNTER — Other Ambulatory Visit: Payer: Self-pay | Admitting: Family Medicine

## 2021-03-10 DIAGNOSIS — F411 Generalized anxiety disorder: Secondary | ICD-10-CM

## 2021-03-10 MED ORDER — ALPRAZOLAM 0.5 MG PO TABS: 0.5000 mg | ORAL_TABLET | Freq: Three times a day (TID) | ORAL | 0 refills | Status: DC | PRN

## 2021-03-13 ENCOUNTER — Ambulatory Visit (INDEPENDENT_AMBULATORY_CARE_PROVIDER_SITE_OTHER): Payer: Medicare HMO | Admitting: Family Medicine

## 2021-03-13 ENCOUNTER — Other Ambulatory Visit: Payer: Self-pay

## 2021-03-13 ENCOUNTER — Encounter: Payer: Self-pay | Admitting: Family Medicine

## 2021-03-13 VITALS — BP 110/71 | HR 73 | Temp 96.8°F | Ht 68.0 in | Wt 355.4 lb

## 2021-03-13 DIAGNOSIS — F411 Generalized anxiety disorder: Secondary | ICD-10-CM

## 2021-03-13 DIAGNOSIS — R0782 Intercostal pain: Secondary | ICD-10-CM

## 2021-03-13 MED ORDER — ALPRAZOLAM 0.5 MG PO TABS: 0.5000 mg | ORAL_TABLET | Freq: Three times a day (TID) | ORAL | 5 refills | Status: DC | PRN

## 2021-03-13 NOTE — Progress Notes (Signed)
Subjective:  Patient ID: Roy [name], male    DOB: Sep 13, 1971  Age: 50 y.o. MRN: [id number]  CC: Medical Management of Chronic Issues   HPI Roy [name] presents for anxiety.  GEts nervous and has chest pain.when anxious.Having problems with 36 year-old daughter getting things from her Mom (They are separated) that he can't afford. Latest is that she wants a $27 birthday party.  Midline chest pain. More to the right side along  sternum. Not episodic or exertional. Sore.  GAD 7 : Generalized Anxiety Score 03/13/2021 12/14/2020 11/14/2020 10/03/2020  Nervous, Anxious, on Edge 1 1 0 1  Control/stop worrying 1 1 2  0  Worry too much - different things 1 2 2 1   Trouble relaxing 0 1 1 0  Restless 0 0 0 0  Easily annoyed or irritable 1 1 2 1   Afraid - awful might happen 1 1 1 1   Total GAD 7 Score 5 7 8 4   Anxiety Difficulty Not difficult at all Somewhat difficult Somewhat difficult Somewhat difficult      Depression screen [facility] 2/9 03/13/2021 03/13/2021 01/19/2021  Decreased Interest 1 0 0  Down, Depressed, Hopeless 1 0 0  PHQ - 2 Score 2 0 0  Altered sleeping 1 - -  Tired, decreased energy 2 - -  Change in appetite 0 - -  Feeling bad or failure about yourself  0 - -  Trouble concentrating 0 - -  Moving slowly or fidgety/restless 0 - -  Suicidal thoughts 0 - -  PHQ-9 Score 5 - -  Difficult doing work/chores Not difficult at all - -  Some recent data might be hidden    History Roy [name] has a past medical history of Anxiety, Asthma, Depression, High cholesterol, Hypertension, Low testosterone, Median nerve dysfunction, Radiculopathy of cervical region, Thyroid disease, Ulnar neuropathy at elbow, and Weakness.   He has a past surgical history that includes Rotator cuff repair (Right); Biceps tendon repair; and Neck surgery.   His family history includes Arrhythmia in his father; Asthma in his brother and daughter; Bipolar disorder in his brother; COPD in his father; Fibromyalgia  in his sister; Hypertension in his mother; Stroke (age of onset: 41) in his mother.He reports that he has been smoking cigarettes. He has a 15.00 pack-year smoking history. He has quit using smokeless tobacco.  His smokeless tobacco use included snuff and chew. He reports that he does not drink alcohol and does not use drugs.    ROS Review of Systems  Constitutional:  Negative for fever.  Respiratory:  Negative for shortness of breath.   Cardiovascular:  Positive for chest pain. Negative for palpitations and leg swelling.  Musculoskeletal:  Negative for arthralgias.  Skin:  Negative for rash.  Psychiatric/Behavioral:  Positive for dysphoric mood. Negative for self-injury, sleep disturbance and suicidal ideas. The patient is nervous/anxious.    Objective:  BP 110/71   Pulse 73   Temp (!) 96.8 F (36 C)   Ht 5\' 8"  (1.727 m)   Wt (!) 355 lb 6.4 oz (161.2 kg)   SpO2 98%   BMI 54.04 kg/m   BP Readings from Last 3 Encounters:  03/13/21 110/71  01/19/21 126/76  01/15/21 124/80    Wt Readings from Last 3 Encounters:  03/13/21 (!) 355 lb 6.4 oz (161.2 kg)  01/19/21 (!) 350 lb (158.8 kg)  01/15/21 (!) 340 lb (154.2 kg)     Physical Exam Vitals reviewed.  Constitutional:      Appearance:  He is well-developed.  HENT:     Head: Normocephalic and atraumatic.     Right Ear: External ear normal.     Left Ear: External ear normal.     Mouth/Throat:     Pharynx: No oropharyngeal exudate or posterior oropharyngeal erythema.  Eyes:     Pupils: Pupils are equal, round, and reactive to light.  Cardiovascular:     Rate and Rhythm: Normal rate and regular rhythm.     Heart sounds: No murmur heard. Pulmonary:     Effort: No respiratory distress.     Breath sounds: Normal breath sounds.  Abdominal:     Palpations: Abdomen is soft.     Tenderness: There is no abdominal tenderness.  Musculoskeletal:     Cervical back: Normal range of motion and neck supple.  Neurological:     Mental  Status: He is alert and oriented to person, place, and time.    EKG: NSR, no ischemic change  Assessment & Plan:   Roy [name] was seen today for medical management of chronic issues.  Diagnoses and all orders for this visit:  Chest pain, unspecified type -     EKG 12-Lead  GAD (generalized anxiety disorder) -     ALPRAZolam (XANAX) 0.5 MG tablet; Take 1 tablet (0.5 mg total) by mouth 3 (three) times daily as needed. for anxiety      I have discontinued Roy [name]. Roy [name]'s levofloxacin. I am also having him maintain his acetaminophen, sildenafil, celecoxib, gabapentin, linaclotide, fexofenadine-pseudoephedrine, Vitamin D (Ergocalciferol), gemfibrozil, albuterol, carvedilol, levothyroxine, traZODone, DULoxetine, Symbicort, dexlansoprazole, ondansetron, pantoprazole, QUEtiapine, and ALPRAZolam.  Allergies as of 03/13/2021       Reactions   Penicillins Rash, Other (See Comments)   Has patient had a PCN reaction causing immediate rash, facial/tongue/throat swelling, SOB or lightheadedness with hypotension: Yes Has patient had a PCN reaction causing severe rash involving mucus membranes or skin necrosis: No Has patient had a PCN reaction that required hospitalization: No Has patient had a PCN reaction occurring within the last 10 years: No If all of the above answers are "NO", then may proceed with Cephalosporin use. Has patient had a PCN reaction causing immediate rash, facial/tongue/throat swelling, SOB or lightheadedness with hypotension: Yes Has patient had a PCN reaction causing severe rash involving mucus membranes or skin necrosis: No Has patient had a PCN reaction that required hospitalization: No Has patient had a PCN reaction occurring within the last 10 years: No If all of the above answers are "NO", then may proceed with Cephalosporin use. unknown   Chantix [varenicline] Other (See Comments)   Severe headache        Medication List        Accurate as of March 13, 2021   8:52 AM. If you have any questions, ask your nurse or doctor.          STOP taking these medications    levofloxacin 500 MG tablet Commonly known as: LEVAQUIN Stopped by: Roy Claude, MD       TAKE these medications    acetaminophen 500 MG tablet Commonly known as: TYLENOL Take 1,000 mg by mouth every 6 (six) hours as needed for headache (pain).   albuterol 108 (90 Base) MCG/ACT inhaler Commonly known as: VENTOLIN HFA Inhale 1-2 puffs into the lungs every 6 (six) hours as needed for wheezing or shortness of breath.   ALPRAZolam 0.5 MG tablet Commonly known as: XANAX Take 1 tablet (0.5 mg total) by mouth 3 (three) times daily as  needed. for anxiety   carvedilol 6.25 MG tablet Commonly known as: COREG Take 1 tablet (6.25 mg total) by mouth 2 (two) times daily with a meal.   celecoxib 200 MG capsule Commonly known as: CeleBREX Take 1 capsule (200 mg total) by mouth daily. For arthritis   dexlansoprazole 60 MG capsule Commonly known as: DEXILANT Take 1 capsule (60 mg total) by mouth daily.   DULoxetine 60 MG capsule Commonly known as: CYMBALTA Take 2 capsules by mouth once daily   fexofenadine-pseudoephedrine 180-240 MG 24 hr tablet Commonly known as: ALLEGRA-D 24 Take 1 tablet by mouth every evening. For allergy and congestion   gabapentin 300 MG capsule Commonly known as: NEURONTIN Take 1 capsule (300 mg total) by mouth 3 (three) times daily.   gemfibrozil 600 MG tablet Commonly known as: LOPID Take 1 tablet by mouth twice daily   levothyroxine 50 MCG tablet Commonly known as: SYNTHROID Take 1 tablet (50 mcg total) by mouth daily.   linaclotide 290 MCG Caps capsule Commonly known as: LINZESS Take 1 capsule (290 mcg total) by mouth daily as needed (constipation).   ondansetron 4 MG disintegrating tablet Commonly known as: Zofran ODT Take 1 tablet (4 mg total) by mouth every 8 (eight) hours as needed.   pantoprazole 40 MG tablet Commonly known as:  PROTONIX Take 1 tablet (40 mg total) by mouth daily.   QUEtiapine 200 MG tablet Commonly known as: SEROQUEL TAKE 1 TABLET BY MOUTH AT BEDTIME FOR  NERVES   sildenafil 20 MG tablet Commonly known as: REVATIO Take 2-5 tablets daily as needed for sex   Symbicort 160-4.5 MCG/ACT inhaler Generic drug: budesonide-formoterol INHALE 2 PUFFS BY MOUTH TWICE DAILY FOR ASTHMA   traZODone 100 MG tablet Commonly known as: DESYREL TAKE 1 TABLET BY MOUTH AT BEDTIME   Vitamin D (Ergocalciferol) 1.25 MG (50000 UNIT) Caps capsule Commonly known as: DRISDOL Take 1 capsule (50,000 Units total) by mouth every 7 (seven) days.         Follow-up: Return in about 6 months (around 09/06/2021) for [facility] physical.  Roy [name], M.D.

## 2021-03-14 ENCOUNTER — Other Ambulatory Visit: Payer: Self-pay | Admitting: Family Medicine

## 2021-03-14 DIAGNOSIS — F411 Generalized anxiety disorder: Secondary | ICD-10-CM

## 2021-04-01 ENCOUNTER — Other Ambulatory Visit: Payer: Self-pay | Admitting: Family Medicine

## 2021-04-01 DIAGNOSIS — F411 Generalized anxiety disorder: Secondary | ICD-10-CM

## 2021-04-01 DIAGNOSIS — M5416 Radiculopathy, lumbar region: Secondary | ICD-10-CM

## 2021-04-09 ENCOUNTER — Other Ambulatory Visit: Payer: Self-pay | Admitting: Family Medicine

## 2021-04-09 DIAGNOSIS — J418 Mixed simple and mucopurulent chronic bronchitis: Secondary | ICD-10-CM

## 2021-04-09 DIAGNOSIS — K219 Gastro-esophageal reflux disease without esophagitis: Secondary | ICD-10-CM

## 2021-04-09 DIAGNOSIS — F411 Generalized anxiety disorder: Secondary | ICD-10-CM

## 2021-05-04 ENCOUNTER — Encounter: Payer: Self-pay | Admitting: *Deleted

## 2021-05-09 ENCOUNTER — Other Ambulatory Visit: Payer: Self-pay | Admitting: Family Medicine

## 2021-05-09 DIAGNOSIS — I10 Essential (primary) hypertension: Secondary | ICD-10-CM

## 2021-05-10 ENCOUNTER — Other Ambulatory Visit: Payer: Self-pay | Admitting: Family Medicine

## 2021-05-10 DIAGNOSIS — J418 Mixed simple and mucopurulent chronic bronchitis: Secondary | ICD-10-CM

## 2021-05-23 ENCOUNTER — Other Ambulatory Visit: Payer: Self-pay | Admitting: Family Medicine

## 2021-06-03 DIAGNOSIS — R438 Other disturbances of smell and taste: Secondary | ICD-10-CM | POA: Diagnosis not present

## 2021-06-03 DIAGNOSIS — Z20822 Contact with and (suspected) exposure to covid-19: Secondary | ICD-10-CM | POA: Diagnosis not present

## 2021-06-03 DIAGNOSIS — R059 Cough, unspecified: Secondary | ICD-10-CM | POA: Diagnosis not present

## 2021-06-11 ENCOUNTER — Other Ambulatory Visit: Payer: Self-pay | Admitting: Family Medicine

## 2021-06-11 DIAGNOSIS — J418 Mixed simple and mucopurulent chronic bronchitis: Secondary | ICD-10-CM

## 2021-06-18 ENCOUNTER — Other Ambulatory Visit: Payer: Self-pay | Admitting: Family Medicine

## 2021-06-22 ENCOUNTER — Encounter: Payer: Self-pay | Admitting: Nurse Practitioner

## 2021-06-22 ENCOUNTER — Ambulatory Visit (INDEPENDENT_AMBULATORY_CARE_PROVIDER_SITE_OTHER): Payer: Medicare HMO | Admitting: Nurse Practitioner

## 2021-06-22 DIAGNOSIS — R059 Cough, unspecified: Secondary | ICD-10-CM

## 2021-06-22 MED ORDER — PROMETHAZINE-DM 6.25-15 MG/5ML PO SYRP: 5.0000 mL | ORAL_SOLUTION | Freq: Four times a day (QID) | ORAL | 0 refills | Status: DC | PRN

## 2021-06-22 MED ORDER — PREDNISONE 20 MG PO TABS: 40.0000 mg | ORAL_TABLET | Freq: Every day | ORAL | 0 refills | Status: AC

## 2021-06-22 NOTE — Progress Notes (Signed)
   Virtual Visit  Note Due to COVID-19 pandemic this visit was conducted virtually. This visit type was conducted due to national recommendations for restrictions regarding the COVID-19 Pandemic (e.g. social distancing, sheltering in place) in an effort to limit this patient's exposure and mitigate transmission in our community. All issues noted in this document were discussed and addressed.  A physical exam was not performed with this format.  I connected with Roy [name] on 06/22/21 at 9:00 by telephone and verified that I am speaking with the correct person using two identifiers. Roy [name] is currently located at home and no one is currently with  him during visit. The provider, Mary-Margaret Daphine Deutscher, FNP is located in their office at time of visit.  I discussed the limitations, risks, security and privacy concerns of performing an evaluation and management service by telephone and the availability of in person appointments. I also discussed with the patient that there may be a patient responsible charge related to this service. The patient expressed understanding and agreed to proceed.   History and Present Illness:  Patient calls in c/o cough and congestion since Oct 11,2022. He last his taste and smell but that has returned. He tested negative for covid. He is taking mucinex DM for congestion and cough.    Review of Systems  Constitutional:  Positive for chills and malaise/fatigue. Negative for fever.  HENT:  Positive for congestion. Negative for sore throat.   Respiratory:  Positive for cough and sputum production. Negative for shortness of breath.   Musculoskeletal:  Negative for myalgias.  Neurological:  Positive for headaches.    Observations/Objective: Alert and oriented- answers all questions appropriately No distress   Assessment and Plan: [name] in today with chief complaint of No chief complaint on file.   1. Cough Force fluids Run humidifier RTO  prn - predniSONE (DELTASONE) 20 MG tablet; Take 2 tablets (40 mg total) by mouth daily with breakfast for 5 days. 2 po daily for 5 days  Dispense: 10 tablet; Refill: 0 - promethazine-dextromethorphan (PROMETHAZINE-DM) 6.25-15 MG/5ML syrup; Take 5 mLs by mouth 4 (four) times daily as needed for cough.  Dispense: 118 mL; Refill: 0     Follow Up Instructions: prn    I discussed the assessment and treatment plan with the patient. The patient was provided an opportunity to ask questions and all were answered. The patient agreed with the plan and demonstrated an understanding of the instructions.   The patient was advised to call back or seek an in-person evaluation if the symptoms worsen or if the condition fails to improve as anticipated.  The above assessment and management plan was discussed with the patient. The patient verbalized understanding of and has agreed to the management plan. Patient is aware to call the clinic if symptoms persist or worsen. Patient is aware when to return to the clinic for a follow-up visit. Patient educated on when it is appropriate to go to the emergency department.   Time call ended:  9:12  I provided 12 minutes of  non face-to-face time during this encounter.    Mary-Margaret Daphine Deutscher, FNP

## 2021-06-26 ENCOUNTER — Encounter: Payer: Self-pay | Admitting: Nurse Practitioner

## 2021-06-26 ENCOUNTER — Ambulatory Visit (INDEPENDENT_AMBULATORY_CARE_PROVIDER_SITE_OTHER): Payer: Medicare HMO | Admitting: Nurse Practitioner

## 2021-06-26 DIAGNOSIS — J069 Acute upper respiratory infection, unspecified: Secondary | ICD-10-CM

## 2021-06-26 MED ORDER — DOXYCYCLINE HYCLATE 100 MG PO TABS: 100.0000 mg | ORAL_TABLET | Freq: Two times a day (BID) | ORAL | 0 refills | Status: DC

## 2021-06-26 NOTE — Progress Notes (Signed)
   Virtual Visit  Note Due to COVID-19 pandemic this visit was conducted virtually. This visit type was conducted due to national recommendations for restrictions regarding the COVID-19 Pandemic (e.g. social distancing, sheltering in place) in an effort to limit this patient's exposure and mitigate transmission in our community. All issues noted in this document were discussed and addressed.  A physical exam was not performed with this format.  I connected with Roy [name] on 06/26/21 at 08:30 am by telephone and verified that I am speaking with the correct person using two identifiers. Roy [name] is currently located at home during visit. The provider, Daryll Drown, NP is located in their office at time of visit.  I discussed the limitations, risks, security and privacy concerns of performing an evaluation and management service by telephone and the availability of in person appointments. I also discussed with the patient that there may be a patient responsible charge related to this service. The patient expressed understanding and agreed to proceed.   History and Present Illness:  URI  This is a recurrent problem. The current episode started 1 to 4 weeks ago. The problem has been gradually worsening. The maximum temperature recorded prior to his arrival was 100.4 - 100.9 F. The fever has been present for 1 to 2 days. Associated symptoms include congestion, headaches and sinus pain. Pertinent negatives include no coughing, nausea or sore throat. He has tried acetaminophen (muccinex, prednisone) for the symptoms.     Review of Systems  HENT:  Positive for congestion and sinus pain. Negative for sore throat.   Respiratory:  Negative for cough.   Gastrointestinal:  Negative for nausea.  Neurological:  Positive for headaches.    Observations/Objective: Televisit patient not in distress  Assessment and Plan: Take meds as prescribed - Use a cool mist humidifier  -Use saline nose  sprays frequently -Force fluids -Doxycycline 100 mg tablet by mouth -For fever or aches or pains- take Tylenol or ibuprofen. -If symptoms do not improve, she may need to be COVID tested to rule this out Follow up with worsening unresolved symptoms   Follow Up Instructions: Follow-up with worsening unresolved symptoms    I discussed the assessment and treatment plan with the patient. The patient was provided an opportunity to ask questions and all were answered. The patient agreed with the plan and demonstrated an understanding of the instructions.   The patient was advised to call back or seek an in-person evaluation if the symptoms worsen or if the condition fails to improve as anticipated.  The above assessment and management plan was discussed with the patient. The patient verbalized understanding of and has agreed to the management plan. Patient is aware to call the clinic if symptoms persist or worsen. Patient is aware when to return to the clinic for a follow-up visit. Patient educated on when it is appropriate to go to the emergency department.   Time call ended: 8:40 AM  I provided 10 minutes of  non face-to-face time during this encounter.    Daryll Drown, NP

## 2021-06-26 NOTE — Assessment & Plan Note (Signed)
Take meds as prescribed - Use a cool mist humidifier  -Use saline nose sprays frequently -Force fluids -Doxycycline 100 mg tablet by mouth -For fever or aches or pains- take Tylenol or ibuprofen. -If symptoms do not improve, she may need to be COVID tested to rule this out Follow up with worsening unresolved symptoms

## 2021-07-02 ENCOUNTER — Ambulatory Visit (INDEPENDENT_AMBULATORY_CARE_PROVIDER_SITE_OTHER): Payer: Medicare HMO

## 2021-07-02 ENCOUNTER — Ambulatory Visit (INDEPENDENT_AMBULATORY_CARE_PROVIDER_SITE_OTHER): Payer: Medicare HMO | Admitting: Nurse Practitioner

## 2021-07-02 ENCOUNTER — Encounter: Payer: Self-pay | Admitting: Nurse Practitioner

## 2021-07-02 VITALS — BP 153/81 | HR 80 | Temp 97.2°F | Resp 96

## 2021-07-02 DIAGNOSIS — J069 Acute upper respiratory infection, unspecified: Secondary | ICD-10-CM | POA: Diagnosis not present

## 2021-07-02 DIAGNOSIS — R062 Wheezing: Secondary | ICD-10-CM | POA: Diagnosis not present

## 2021-07-02 NOTE — Patient Instructions (Signed)
Upper Respiratory Infection, Adult An upper respiratory infection (URI) is a common viral infection of the nose, throat, and upper air passages that lead to the lungs. The most common type of URI is the common cold. URIs usually get better on their own, without medical treatment. What are the causes? A URI is caused by a virus. You may catch a virus by: Breathing in droplets from an infected person's cough or sneeze. Touching something that has been exposed to the virus (contaminated) and then touching your mouth, nose, or eyes. What increases the risk? You are more likely to get a URI if: You are very young or very old. It is autumn or winter. You have close contact with others, such as at a daycare, school, or health care facility. You smoke. You have long-term (chronic) heart or lung disease. You have a weakened disease-fighting (immune) system. You have nasal allergies or asthma. You are experiencing a lot of stress. You work in an area that has poor air circulation. You have poor nutrition. What are the signs or symptoms? A URI usually involves some of the following symptoms: Runny or stuffy (congested) nose. Sneezing. Cough. Sore throat. Headache. Fatigue. Fever. Loss of appetite. Pain in your forehead, behind your eyes, and over your cheekbones (sinus pain). Muscle aches. Redness or irritation of the eyes. Pressure in the ears or face. How is this diagnosed? This condition may be diagnosed based on your medical history and symptoms, and a physical exam. Your health care provider may use a cotton swab to take a mucus sample from your nose (nasal swab). This sample can be tested to determine what virus is causing the illness. How is this treated? URIs usually get better on their own within 7-10 days. You can take steps at home to relieve your symptoms. Medicines cannot cure URIs, but your health care provider may recommend certain medicines to help relieve symptoms, such  as: Over-the-counter cold medicines. Cough suppressants. Coughing is a type of defense against infection that helps to clear the respiratory system, so take these medicines only as recommended by your health care provider. Fever-reducing medicines. Follow these instructions at home: Activity Rest as needed. If you have a fever, stay home from work or school until your fever is gone or until your health care provider says you are no longer contagious. Your health care provider may have you wear a face mask to prevent your infection from spreading. Relieving symptoms Gargle with a salt-water mixture 3-4 times a day or as needed. To make a salt-water mixture, completely dissolve -1 tsp of salt in 1 cup of warm water. Use a cool-mist humidifier to add moisture to the air. This can help you breathe more easily. Eating and drinking  Drink enough fluid to keep your urine pale yellow. Eat soups and other clear broths. General instructions  Take over-the-counter and prescription medicines only as told by your health care provider. These include cold medicines, fever reducers, and cough suppressants. Do not use any products that contain nicotine or tobacco, such as cigarettes and e-cigarettes. If you need help quitting, ask your health care provider. Stay away from secondhand smoke. Stay up to date on all immunizations, including the yearly (annual) flu vaccine. Keep all follow-up visits as told by your health care provider. This is important. How to prevent the spread of infection to others  URIs can be passed from person to person (are contagious). To prevent the infection from spreading: Wash your hands often with soap and   water. If soap and water are not available, use hand sanitizer. Avoid touching your mouth, face, eyes, or nose. Cough or sneeze into a tissue or your sleeve or elbow instead of into your hand or into the air. Contact a health care provider if: You are getting worse instead  of better. You have a fever or chills. Your mucus is brown or red. You have yellow or brown discharge coming from your nose. You have pain in your face, especially when you bend forward. You have swollen neck glands. You have pain while swallowing. You have white areas in the back of your throat. Get help right away if: You have shortness of breath that gets worse. You have severe or persistent: Headache. Ear pain. Sinus pain. Chest pain. You have chronic lung disease along with any of the following: Wheezing. Prolonged cough. Coughing up blood. A change in your usual mucus. You have a stiff neck. You have changes in your: Vision. Hearing. Thinking. Mood. Summary An upper respiratory infection (URI) is a common infection of the nose, throat, and upper air passages that lead to the lungs. A URI is caused by a virus. URIs usually get better on their own within 7-10 days. Medicines cannot cure URIs, but your health care provider may recommend certain medicines to help relieve symptoms. This information is not intended to replace advice given to you by your health care provider. Make sure you discuss any questions you have with your health care provider. Document Revised: 05/18/2020 Document Reviewed: 05/18/2020 Elsevier Patient Education  2022 [employer]  

## 2021-07-02 NOTE — Assessment & Plan Note (Signed)
Symptoms not well controlled in the past few weeks, complotted doxycycline today and prednisone.  COVID-19 test came back negative, chest x-ray completed results pending.

## 2021-07-02 NOTE — Progress Notes (Deleted)
Established Patient Office Visit  Subjective:  Patient ID: Roy [name], male    DOB: 25-Apr-1971  Age: 50 y.o. MRN: [id number]  CC: No chief complaint on file.   HPI Roy [name] presents for ***  Past Medical History:  Diagnosis Date   Anxiety    Asthma    Depression    High cholesterol    Hypertension    Low testosterone    Median nerve dysfunction    Radiculopathy of cervical region    Thyroid disease    Ulnar neuropathy at elbow    Weakness     Past Surgical History:  Procedure Laterality Date   BICEPS TENDON REPAIR     NECK SURGERY     Fusion done two seperate times   ROTATOR CUFF REPAIR Right     Family History  Problem Relation Age of Onset   Hypertension Mother    Stroke Mother 8   Arrhythmia Father    COPD Father    Asthma Brother    Bipolar disorder Brother    Asthma Daughter    Fibromyalgia Sister     Social History   Socioeconomic History   Marital status: Married    Spouse name: Not on file   Number of children: 1   Years of education: GED   Highest education level: GED or equivalent  Occupational History   Occupation: Disability    Comment: Worked as Community education officer  Tobacco Use   Smoking status: Every Day    Packs/day: 0.50    Years: 30.00    Pack years: 15.00    Types: Cigarettes    Last attempt to quit: 07/23/2017    Years since quitting: 3.9   Smokeless tobacco: Former    Types: Snuff, Chew  Vaping Use   Vaping Use: Never used  Substance and Sexual Activity   Alcohol use: No   Drug use: No   Sexual activity: Yes  Other Topics Concern   Not on file  Social History Narrative   Not on file   Social Determinants of Health   Financial Resource Strain: Not on file  Food Insecurity: Not on file  Transportation Needs: Not on file  Physical Activity: Not on file  Stress: Not on file  Social Connections: Not on file  Intimate Partner Violence: Not on file    Outpatient Medications Prior to Visit   Medication Sig Dispense Refill   acetaminophen (TYLENOL) 500 MG tablet Take 1,000 mg by mouth every 6 (six) hours as needed for headache (pain).      albuterol (VENTOLIN HFA) 108 (90 Base) MCG/ACT inhaler Inhale 1-2 puffs into the lungs every 6 (six) hours as needed for wheezing or shortness of breath. 18 g 5   ALPRAZolam (XANAX) 0.5 MG tablet Take 1 tablet (0.5 mg total) by mouth 3 (three) times daily as needed. for anxiety 90 tablet 5   carvedilol (COREG) 6.25 MG tablet TAKE 1 TABLET BY MOUTH TWICE DAILY WITH A MEAL 180 tablet 0   celecoxib (CELEBREX) 200 MG capsule Take 1 capsule (200 mg total) by mouth daily. For arthritis 90 capsule 3   DEXILANT 60 MG capsule Take 1 capsule by mouth once daily 90 capsule 0   doxycycline (VIBRA-TABS) 100 MG tablet Take 1 tablet (100 mg total) by mouth 2 (two) times daily. 14 tablet 0   DULoxetine (CYMBALTA) 60 MG capsule Take 2 capsules by mouth once daily 180 capsule 1   fexofenadine-pseudoephedrine (ALLEGRA-D 24) 180-240 MG  24 hr tablet Take 1 tablet by mouth every evening. For allergy and congestion (Patient not taking: Reported on 03/13/2021) 30 tablet 11   gabapentin (NEURONTIN) 300 MG capsule Take 1 capsule (300 mg total) by mouth 3 (three) times daily. 270 capsule 1   gemfibrozil (LOPID) 600 MG tablet Take 1 tablet by mouth twice daily 180 tablet 0   levothyroxine (SYNTHROID) 50 MCG tablet Take 1 tablet (50 mcg total) by mouth daily. 90 tablet 1   linaclotide (LINZESS) 290 MCG CAPS capsule Take 1 capsule (290 mcg total) by mouth daily as needed (constipation). 90 capsule 1   ondansetron (ZOFRAN ODT) 4 MG disintegrating tablet Take 1 tablet (4 mg total) by mouth every 8 (eight) hours as needed. 20 tablet 0   pantoprazole (PROTONIX) 40 MG tablet Take 1 tablet (40 mg total) by mouth daily. (Patient not taking: Reported on 01/19/2021) 30 tablet 0   promethazine-dextromethorphan (PROMETHAZINE-DM) 6.25-15 MG/5ML syrup Take 5 mLs by mouth 4 (four) times daily as  needed for cough. 118 mL 0   QUEtiapine (SEROQUEL) 200 MG tablet TAKE 1 TABLET BY MOUTH AT BEDTIME FOR NERVES 90 tablet 3   sildenafil (REVATIO) 20 MG tablet Take 2-5 tablets daily as needed for sex 180 tablet 1   SYMBICORT 160-4.5 MCG/ACT inhaler INHALE 2 PUFFS INTO LUNGS TWICE DAILY FOR ASTHMA 11 g 0   traZODone (DESYREL) 100 MG tablet TAKE 1 TABLET BY MOUTH AT BEDTIME 90 tablet 0   Vitamin D, Ergocalciferol, (DRISDOL) 1.25 MG (50000 UNIT) CAPS capsule Take 1 capsule (50,000 Units total) by mouth every 7 (seven) days. 13 capsule 3   No facility-administered medications prior to visit.    Allergies  Allergen Reactions   Penicillins Rash and Other (See Comments)    Has patient had a PCN reaction causing immediate rash, facial/tongue/throat swelling, SOB or lightheadedness with hypotension: Yes Has patient had a PCN reaction causing severe rash involving mucus membranes or skin necrosis: No Has patient had a PCN reaction that required hospitalization: No Has patient had a PCN reaction occurring within the last 10 years: No If all of the above answers are "NO", then may proceed with Cephalosporin use. Has patient had a PCN reaction causing immediate rash, facial/tongue/throat swelling, SOB or lightheadedness with hypotension: Yes Has patient had a PCN reaction causing severe rash involving mucus membranes or skin necrosis: No Has patient had a PCN reaction that required hospitalization: No Has patient had a PCN reaction occurring within the last 10 years: No If all of the above answers are "NO", then may proceed with Cephalosporin use. unknown    Chantix [Varenicline] Other (See Comments)    Severe headache    ROS Review of Systems    Objective:    Physical Exam  There were no vitals taken for this visit. Wt Readings from Last 3 Encounters:  03/13/21 (!) 355 lb 6.4 oz (161.2 kg)  01/19/21 (!) 350 lb (158.8 kg)  01/15/21 (!) 340 lb (154.2 kg)     Health Maintenance Due   Topic Date Due   COVID-19 Vaccine (1) Never done   COLONOSCOPY (Pts 45-19yr Insurance coverage will need to be confirmed)  Never done   INFLUENZA VACCINE  04/23/2021    There are no preventive care reminders to display for this patient.  Lab Results  Component Value Date   TSH 2.350 01/19/2021   Lab Results  Component Value Date   WBC 6.1 01/19/2021   HGB 13.5 01/19/2021   HCT 40.5  01/19/2021   MCV 91 01/19/2021   PLT 298 01/19/2021   Lab Results  Component Value Date   NA 138 01/19/2021   K 4.4 01/19/2021   CO2 25 01/19/2021   GLUCOSE 114 (H) 01/19/2021   BUN 10 01/19/2021   CREATININE 0.84 01/19/2021   BILITOT 0.3 01/19/2021   ALKPHOS 82 01/19/2021   AST 21 01/19/2021   ALT 23 01/19/2021   PROT 6.8 01/19/2021   ALBUMIN 4.5 01/19/2021   CALCIUM 9.4 01/19/2021   ANIONGAP 13 01/15/2021   EGFR 108 01/19/2021   Lab Results  Component Value Date   CHOL 175 11/14/2020   Lab Results  Component Value Date   HDL 39 (L) 11/14/2020   Lab Results  Component Value Date   LDLCALC 113 (H) 11/14/2020   Lab Results  Component Value Date   TRIG 125 11/14/2020   Lab Results  Component Value Date   CHOLHDL 4.5 11/14/2020   Lab Results  Component Value Date   HGBA1C 5.8% 11/17/2014      Assessment & Plan:   Problem List Items Addressed This Visit   None   No orders of the defined types were placed in this encounter.   Follow-up: No follow-ups on file.    Adriana Quinby, [place] Bruns, LPN

## 2021-07-02 NOTE — Progress Notes (Signed)
Acute Office Visit  Subjective:    Patient ID: Roy [name], male    DOB: 25-Sep-1970, 50 y.o.   MRN: [id number]  Chief Complaint  Patient presents with   Shortness of Breath   naval bleeding    Shortness of Breath This is a recurrent problem. The current episode started in the past 7 days. The problem occurs constantly. The problem has been unchanged. Associated symptoms include a fever and sputum production. Pertinent negatives include no abdominal pain, chest pain, headaches, leg pain, leg swelling, rash or sore throat. Nothing aggravates the symptoms. He has tried oral steroids and prescription cough suppressants for the symptoms. The treatment provided moderate relief.   Past Medical History:  Diagnosis Date   Anxiety    Asthma    Depression    High cholesterol    Hypertension    Low testosterone    Median nerve dysfunction    Radiculopathy of cervical region    Thyroid disease    Ulnar neuropathy at elbow    Weakness     Past Surgical History:  Procedure Laterality Date   BICEPS TENDON REPAIR     NECK SURGERY     Fusion done two seperate times   ROTATOR CUFF REPAIR Right     Family History  Problem Relation Age of Onset   Hypertension Mother    Stroke Mother 65   Arrhythmia Father    COPD Father    Asthma Brother    Bipolar disorder Brother    Asthma Daughter    Fibromyalgia Sister     Social History   Socioeconomic History   Marital status: Married    Spouse name: Not on file   Number of children: 1   Years of education: GED   Highest education level: GED or equivalent  Occupational History   Occupation: Disability    Comment: Worked as Community education officer  Tobacco Use   Smoking status: Every Day    Packs/day: 0.50    Years: 30.00    Pack years: 15.00    Types: Cigarettes    Last attempt to quit: 07/23/2017    Years since quitting: 3.9   Smokeless tobacco: Former    Types: Snuff, Chew  Vaping Use   Vaping Use: Never used  Substance  and Sexual Activity   Alcohol use: No   Drug use: No   Sexual activity: Yes  Other Topics Concern   Not on file  Social History Narrative   Not on file   Social Determinants of Health   Financial Resource Strain: Not on file  Food Insecurity: Not on file  Transportation Needs: Not on file  Physical Activity: Not on file  Stress: Not on file  Social Connections: Not on file  Intimate Partner Violence: Not on file    Outpatient Medications Prior to Visit  Medication Sig Dispense Refill   acetaminophen (TYLENOL) 500 MG tablet Take 1,000 mg by mouth every 6 (six) hours as needed for headache (pain).      albuterol (VENTOLIN HFA) 108 (90 Base) MCG/ACT inhaler Inhale 1-2 puffs into the lungs every 6 (six) hours as needed for wheezing or shortness of breath. 18 g 5   ALPRAZolam (XANAX) 0.5 MG tablet Take 1 tablet (0.5 mg total) by mouth 3 (three) times daily as needed. for anxiety 90 tablet 5   carvedilol (COREG) 6.25 MG tablet TAKE 1 TABLET BY MOUTH TWICE DAILY WITH A MEAL 180 tablet 0   celecoxib (CELEBREX) 200 MG  capsule Take 1 capsule (200 mg total) by mouth daily. For arthritis 90 capsule 3   DEXILANT 60 MG capsule Take 1 capsule by mouth once daily 90 capsule 0   doxycycline (VIBRA-TABS) 100 MG tablet Take 1 tablet (100 mg total) by mouth 2 (two) times daily. 14 tablet 0   DULoxetine (CYMBALTA) 60 MG capsule Take 2 capsules by mouth once daily 180 capsule 1   fexofenadine-pseudoephedrine (ALLEGRA-D 24) 180-240 MG 24 hr tablet Take 1 tablet by mouth every evening. For allergy and congestion (Patient not taking: Reported on 03/13/2021) 30 tablet 11   gabapentin (NEURONTIN) 300 MG capsule Take 1 capsule (300 mg total) by mouth 3 (three) times daily. 270 capsule 1   gemfibrozil (LOPID) 600 MG tablet Take 1 tablet by mouth twice daily 180 tablet 0   levothyroxine (SYNTHROID) 50 MCG tablet Take 1 tablet (50 mcg total) by mouth daily. 90 tablet 1   linaclotide (LINZESS) 290 MCG CAPS capsule  Take 1 capsule (290 mcg total) by mouth daily as needed (constipation). 90 capsule 1   ondansetron (ZOFRAN ODT) 4 MG disintegrating tablet Take 1 tablet (4 mg total) by mouth every 8 (eight) hours as needed. 20 tablet 0   pantoprazole (PROTONIX) 40 MG tablet Take 1 tablet (40 mg total) by mouth daily. (Patient not taking: Reported on 01/19/2021) 30 tablet 0   promethazine-dextromethorphan (PROMETHAZINE-DM) 6.25-15 MG/5ML syrup Take 5 mLs by mouth 4 (four) times daily as needed for cough. 118 mL 0   QUEtiapine (SEROQUEL) 200 MG tablet TAKE 1 TABLET BY MOUTH AT BEDTIME FOR NERVES 90 tablet 3   sildenafil (REVATIO) 20 MG tablet Take 2-5 tablets daily as needed for sex 180 tablet 1   SYMBICORT 160-4.5 MCG/ACT inhaler INHALE 2 PUFFS INTO LUNGS TWICE DAILY FOR ASTHMA 11 g 0   traZODone (DESYREL) 100 MG tablet TAKE 1 TABLET BY MOUTH AT BEDTIME 90 tablet 0   Vitamin D, Ergocalciferol, (DRISDOL) 1.25 MG (50000 UNIT) CAPS capsule Take 1 capsule (50,000 Units total) by mouth every 7 (seven) days. 13 capsule 3   No facility-administered medications prior to visit.    Allergies  Allergen Reactions   Penicillins Rash and Other (See Comments)    Has patient had a PCN reaction causing immediate rash, facial/tongue/throat swelling, SOB or lightheadedness with hypotension: Yes Has patient had a PCN reaction causing severe rash involving mucus membranes or skin necrosis: No Has patient had a PCN reaction that required hospitalization: No Has patient had a PCN reaction occurring within the last 10 years: No If all of the above answers are "NO", then may proceed with Cephalosporin use. Has patient had a PCN reaction causing immediate rash, facial/tongue/throat swelling, SOB or lightheadedness with hypotension: Yes Has patient had a PCN reaction causing severe rash involving mucus membranes or skin necrosis: No Has patient had a PCN reaction that required hospitalization: No Has patient had a PCN reaction occurring  within the last 10 years: No If all of the above answers are "NO", then may proceed with Cephalosporin use. unknown    Chantix [Varenicline] Other (See Comments)    Severe headache    Review of Systems  Constitutional:  Positive for fever. Negative for fatigue.  HENT:  Positive for congestion. Negative for sore throat.   Respiratory:  Positive for sputum production and shortness of breath.   Cardiovascular:  Negative for chest pain and leg swelling.  Gastrointestinal:  Negative for abdominal pain and nausea.  Skin:  Negative for rash.  Neurological:  Negative for headaches.  All other systems reviewed and are negative.     Objective:    Physical Exam Vitals and nursing note reviewed.  Constitutional:      Appearance: Normal appearance. He is well-developed. He is obese.  HENT:     Head: Normocephalic.     Nose: Congestion present.     Mouth/Throat:     Mouth: Mucous membranes are moist.  Eyes:     Conjunctiva/sclera: Conjunctivae normal.  Cardiovascular:     Pulses: Normal pulses.     Heart sounds: Normal heart sounds.  Pulmonary:     Effort: Pulmonary effort is normal.     Breath sounds: Normal breath sounds.  Abdominal:     General: Bowel sounds are normal.  Musculoskeletal:        General: Normal range of motion.  Skin:    Findings: No rash.  Neurological:     Mental Status: He is alert and oriented to person, place, and time.  Psychiatric:        Behavior: Behavior normal.    BP (!) 153/81   Pulse 80   Temp (!) 97.2 F (36.2 C) (Temporal)   Resp (!) 96  Wt Readings from Last 3 Encounters:  03/13/21 (!) 355 lb 6.4 oz (161.2 kg)  01/19/21 (!) 350 lb (158.8 kg)  01/15/21 (!) 340 lb (154.2 kg)    Health Maintenance Due  Topic Date Due   COVID-19 Vaccine (1) Never done   COLONOSCOPY (Pts 45-52yr Insurance coverage will need to be confirmed)  Never done   INFLUENZA VACCINE  04/23/2021    There are no preventive care reminders to display for this  patient.   Lab Results  Component Value Date   TSH 2.350 01/19/2021   Lab Results  Component Value Date   WBC 6.1 01/19/2021   HGB 13.5 01/19/2021   HCT 40.5 01/19/2021   MCV 91 01/19/2021   PLT 298 01/19/2021   Lab Results  Component Value Date   NA 138 01/19/2021   K 4.4 01/19/2021   CO2 25 01/19/2021   GLUCOSE 114 (H) 01/19/2021   BUN 10 01/19/2021   CREATININE 0.84 01/19/2021   BILITOT 0.3 01/19/2021   ALKPHOS 82 01/19/2021   AST 21 01/19/2021   ALT 23 01/19/2021   PROT 6.8 01/19/2021   ALBUMIN 4.5 01/19/2021   CALCIUM 9.4 01/19/2021   ANIONGAP 13 01/15/2021   EGFR 108 01/19/2021   Lab Results  Component Value Date   CHOL 175 11/14/2020   Lab Results  Component Value Date   HDL 39 (L) 11/14/2020   Lab Results  Component Value Date   LDLCALC 113 (H) 11/14/2020   Lab Results  Component Value Date   TRIG 125 11/14/2020   Lab Results  Component Value Date   CHOLHDL 4.5 11/14/2020   Lab Results  Component Value Date   HGBA1C 5.8% 11/17/2014       Assessment & Plan:   Problem List Items Addressed This Visit       Respiratory   URI with cough and congestion - Primary    Symptoms not well controlled in the past few weeks, complotted doxycycline today and prednisone.  COVID-19 test came back negative, chest x-ray completed results pending.         Relevant Orders   DG Chest 2 View     No orders of the defined types were placed in this encounter.    OIvy Lynn NP

## 2021-07-06 ENCOUNTER — Other Ambulatory Visit: Payer: Self-pay | Admitting: Family Medicine

## 2021-07-06 DIAGNOSIS — E039 Hypothyroidism, unspecified: Secondary | ICD-10-CM

## 2021-07-09 ENCOUNTER — Other Ambulatory Visit: Payer: Self-pay | Admitting: Family Medicine

## 2021-07-09 DIAGNOSIS — K219 Gastro-esophageal reflux disease without esophagitis: Secondary | ICD-10-CM

## 2021-07-11 ENCOUNTER — Other Ambulatory Visit: Payer: Self-pay | Admitting: Family Medicine

## 2021-07-11 DIAGNOSIS — J418 Mixed simple and mucopurulent chronic bronchitis: Secondary | ICD-10-CM

## 2021-07-17 ENCOUNTER — Ambulatory Visit (INDEPENDENT_AMBULATORY_CARE_PROVIDER_SITE_OTHER): Payer: Medicare HMO

## 2021-07-17 VITALS — Ht 68.0 in | Wt 355.0 lb

## 2021-07-17 DIAGNOSIS — Z0000 Encounter for general adult medical examination without abnormal findings: Secondary | ICD-10-CM

## 2021-07-17 NOTE — Patient Instructions (Addendum)
Roy [name] , Thank you for taking time to come for your Medicare Wellness Visit. I appreciate your ongoing commitment to your health goals. Please review the following plan we discussed and let me know if I can assist you in the future.   Screening recommendations/referrals: Colonoscopy: Due at age 48 Recommended yearly ophthalmology/optometry visit for glaucoma screening and checkup Recommended yearly dental visit for hygiene and checkup  Vaccinations: Influenza vaccine: Due (every fall) Pneumococcal vaccine: Due at age 36 - sooner if provider indicates due to smoker and asthma Tdap vaccine: Done 11/16/2014 - Repeat in 10 years  Shingles vaccine: Due at age 33   Covid-19: Declined  Advanced directives: Advance directive discussed with you today. Even though you declined this today, please call our office should you change your mind, and we can give you the proper paperwork for you to fill out.   Conditions/risks identified: Aim for 30 minutes of exercise or brisk walking each day, drink 6-8 glasses of water and eat lots of fruits and vegetables.   Next appointment: Follow up in one year for your annual wellness visit   Preventive Care 40-64 Years, Male Preventive care refers to lifestyle choices and visits with your health care provider that can promote health and wellness. What does preventive care include? A yearly physical exam. This is also called an annual well check. Dental exams once or twice a year. Routine eye exams. Ask your health care provider how often you should have your eyes checked. Personal lifestyle choices, including: Daily care of your teeth and gums. Regular physical activity. Eating a healthy diet. Avoiding tobacco and drug use. Limiting alcohol use. Practicing safe sex. Taking low-dose aspirin every day starting at age 15. What happens during an annual well check? The services and screenings done by your health care provider during your annual well check  will depend on your age, overall health, lifestyle risk factors, and family history of disease. Counseling  Your health care provider may ask you questions about your: Alcohol use. Tobacco use. Drug use. Emotional well-being. Home and relationship well-being. Sexual activity. Eating habits. Work and work Astronomer. Screening  You may have the following tests or measurements: Height, weight, and BMI. Blood pressure. Lipid and cholesterol levels. These may be checked every 5 years, or more frequently if you are over 26 years old. Skin check. Lung cancer screening. You may have this screening every year starting at age 76 if you have a 30-pack-year history of smoking and currently smoke or have quit within the past 15 years. Fecal occult blood test (FOBT) of the stool. You may have this test every year starting at age 75. Flexible sigmoidoscopy or colonoscopy. You may have a sigmoidoscopy every 5 years or a colonoscopy every 10 years starting at age 80. Prostate cancer screening. Recommendations will vary depending on your family history and other risks. Hepatitis C blood test. Hepatitis B blood test. Sexually transmitted disease (STD) testing. Diabetes screening. This is done by checking your blood sugar (glucose) after you have not eaten for a while (fasting). You may have this done every 1-3 years. Discuss your test results, treatment options, and if necessary, the need for more tests with your health care provider. Vaccines  Your health care provider may recommend certain vaccines, such as: Influenza vaccine. This is recommended every year. Tetanus, diphtheria, and acellular pertussis (Tdap, Td) vaccine. You may need a Td booster every 10 years. Zoster vaccine. You may need this after age 13. Pneumococcal 13-valent conjugate (PCV13)  vaccine. You may need this if you have certain conditions and have not been vaccinated. Pneumococcal polysaccharide (PPSV23) vaccine. You may need one  or two doses if you smoke cigarettes or if you have certain conditions. Talk to your health care provider about which screenings and vaccines you need and how often you need them. This information is not intended to replace advice given to you by your health care provider. Make sure you discuss any questions you have with your health care provider. Document Released: 10/06/2015 Document Revised: 05/29/2016 Document Reviewed: 07/11/2015 Elsevier Interactive Patient Education  2017 [employer]   Obesity, Adult Obesity is the condition of having too much total body fat. Being overweight or obese means that your weight is greater than what is considered healthy for your body size. Obesity is determined by a measurement called BMI. BMI is an estimate of body fat and is calculated from height and weight. For adults, a BMI of 30 or higher is considered obese. Obesity can lead to other health concerns and major illnesses, including: Stroke. Coronary artery disease (CAD). Type 2 diabetes. Some types of cancer, including cancers of the colon, breast, uterus, and gallbladder. Osteoarthritis. High blood pressure (hypertension). High cholesterol. Sleep apnea. Gallbladder stones. Infertility problems. What are the causes? Common causes of this condition include: Eating daily meals that are high in calories, sugar, and fat. Being born with genes that may make you more likely to become obese. Having a medical condition that causes obesity, including: Hypothyroidism. Polycystic ovarian syndrome (PCOS). Binge-eating disorder. Cushing syndrome. Taking certain medicines, such as steroids, antidepressants, and seizure medicines. Not being physically active (sedentary lifestyle). Not getting enough sleep. Drinking high amounts of sugar-sweetened beverages, such as soft drinks. What increases the risk? The following factors may make you more likely to develop this condition: Having a family history of  obesity. Being a woman of African American descent. Being a man of Hispanic descent. Living in an area with limited access to: [place], recreation centers, or sidewalks. Healthy food choices, such as grocery stores and farmers' markets. What are the signs or symptoms? The main sign of this condition is having too much body fat. How is this diagnosed? This condition is diagnosed based on: Your BMI. If you are an adult with a BMI of 30 or higher, you are considered obese. Your waist circumference. This measures the distance around your waistline. Your skinfold thickness. Your health care provider may gently pinch a fold of your skin and measure it. You may have other tests to check for underlying conditions. How is this treated? Treatment for this condition often includes changing your lifestyle. Treatment may include some or all of the following: Dietary changes. This may include developing a healthy meal plan. Regular physical activity. This may include activity that causes your heart to beat faster (aerobic exercise) and strength training. Work with your health care provider to design an exercise program that works for you. Medicine to help you lose weight if you are unable to lose 1 pound a week after 6 weeks of healthy eating and more physical activity. Treating conditions that cause the obesity (underlying conditions). Surgery. Surgical options may include gastric banding and gastric bypass. Surgery may be done if: Other treatments have not helped to improve your condition. You have a BMI of 40 or higher. You have life-threatening health problems related to obesity. Follow these instructions at home: Eating and drinking  Follow recommendations from your health care provider about what you eat and drink.  Your health care provider may advise you to: Limit fast food, sweets, and processed snack foods. Choose low-fat options, such as low-fat milk instead of whole milk. Eat 5 or more  servings of fruits or vegetables every day. Eat at home more often. This gives you more control over what you eat. Choose healthy foods when you eat out. Learn to read food labels. This will help you understand how much food is considered 1 serving. Learn what a healthy serving size is. Keep low-fat snacks available. Limit sugary drinks, such as soda, fruit juice, sweetened iced tea, and flavored milk. Drink enough water to keep your urine pale yellow. Do not follow a fad diet. Fad diets can be unhealthy and even dangerous. Physical activity Exercise regularly, as told by your health care provider. Most adults should get up to 150 minutes of moderate-intensity exercise every week. Ask your health care provider what types of exercise are safe for you and how often you should exercise. Warm up and stretch before being active. Cool down and stretch after being active. Rest between periods of activity. Lifestyle Work with your health care provider and a dietitian to set a weight-loss goal that is healthy and reasonable for you. Limit your screen time. Find ways to reward yourself that do not involve food. Do not drink alcohol if: Your health care provider tells you not to drink. You are pregnant, may be pregnant, or are planning to become pregnant. If you drink alcohol: Limit how much you use to: 0-1 drink a day for women. 0-2 drinks a day for men. Be aware of how much alcohol is in your drink. In the [place] one drink equals one 12 oz bottle of beer (355 mL), one 5 oz glass of wine (148 mL), or one 1 oz glass of hard liquor (44 mL). General instructions Keep a weight-loss journal to keep track of the food you eat and how much exercise you get. Take over-the-counter and prescription medicines only as told by your health care provider. Take vitamins and supplements only as told by your health care provider. Consider joining a support group. Your health care provider may be able to recommend  a support group. Keep all follow-up visits as told by your health care provider. This is important. Contact a health care provider if: You are unable to meet your weight loss goal after 6 weeks of dietary and lifestyle changes. Get help right away if you are having: Trouble breathing. Suicidal thoughts or behaviors. Summary Obesity is the condition of having too much total body fat. Being overweight or obese means that your weight is greater than what is considered healthy for your body size. Work with your health care provider and a dietitian to set a weight-loss goal that is healthy and reasonable for you. Exercise regularly, as told by your health care provider. Ask your health care provider what types of exercise are safe for you and how often you should exercise. This information is not intended to replace advice given to you by your health care provider. Make sure you discuss any questions you have with your health care provider. Document Revised: 05/14/2018 Document Reviewed: 05/14/2018 Elsevier Patient Education  2022 [employer].

## 2021-07-17 NOTE — Progress Notes (Signed)
Subjective:   BRAXDON GAPPA is a 50 y.o. male who presents for Medicare Annual/Subsequent preventive examination.  Virtual Visit via Telephone Note  I connected with  DIEGO ULBRICHT on 07/17/21 at  8:15 AM EDT by telephone and verified that I am speaking with the correct person using two identifiers.  Location: Patient: Home Provider: [facility] Persons participating in the virtual visit: patient/Nurse Health Advisor   I discussed the limitations, risks, security and privacy concerns of performing an evaluation and management service by telephone and the availability of in person appointments. The patient expressed understanding and agreed to proceed.  Interactive audio and video telecommunications were attempted between this nurse and patient, however failed, due to patient having technical difficulties OR patient did not have access to video capability.  We continued and completed visit with audio only.  Some vital signs may be absent or patient reported.   Takyra Cantrall E Shaunette Gassner, LPN   Review of Systems     Cardiac Risk Factors include: dyslipidemia;hypertension;male gender;smoking/ tobacco exposure;sedentary lifestyle;obesity (BMI >30kg/m2);Other (see comment), Risk factor comments: asthma     Objective:    Today's Vitals   07/17/21 0817 07/17/21 0818  Weight: (!) 355 lb (161 kg)   Height:  (1.727 m)   PainSc:  6    Body mass index is 53.98 kg/m.  Advanced Directives 07/17/2021 01/15/2021 06/02/2020 09/22/2019 01/23/2019 10/22/2017 10/20/2017  Does Patient Have a Medical Advance Directive? No No No No No No No  Would patient like information on creating a medical advance directive? No - Patient declined No - Patient declined No - Patient declined - - Yes (ED - Information included in AVS) No - Patient declined    Current Medications (verified) Outpatient Encounter Medications as of 07/17/2021  Medication Sig   acetaminophen (TYLENOL) 500 MG tablet Take 1,000 mg by mouth every 6  (six) hours as needed for headache (pain).    albuterol (VENTOLIN HFA) 108 (90 Base) MCG/ACT inhaler Inhale 1-2 puffs into the lungs every 6 (six) hours as needed for wheezing or shortness of breath.   ALPRAZolam (XANAX) 0.5 MG tablet Take 1 tablet (0.5 mg total) by mouth 3 (three) times daily as needed. for anxiety   carvedilol (COREG) 6.25 MG tablet TAKE 1 TABLET BY MOUTH TWICE DAILY WITH A MEAL   celecoxib (CELEBREX) 200 MG capsule Take 1 capsule (200 mg total) by mouth daily. For arthritis   dexlansoprazole (DEXILANT) 60 MG capsule Take 1 capsule by mouth once daily   DULoxetine (CYMBALTA) 60 MG capsule Take 2 capsules by mouth once daily   gemfibrozil (LOPID) 600 MG tablet Take 1 tablet by mouth twice daily   levothyroxine (SYNTHROID) 50 MCG tablet Take 1 tablet by mouth once daily   linaclotide (LINZESS) 290 MCG CAPS capsule Take 1 capsule (290 mcg total) by mouth daily as needed (constipation).   QUEtiapine (SEROQUEL) 200 MG tablet TAKE 1 TABLET BY MOUTH AT BEDTIME FOR NERVES   sildenafil (REVATIO) 20 MG tablet Take 2-5 tablets daily as needed for sex   SYMBICORT 160-4.5 MCG/ACT inhaler INHALE 2 PUFFS INTO LUNGS TWICE DAILY FOR ASTHMA   traZODone (DESYREL) 100 MG tablet TAKE 1 TABLET BY MOUTH AT BEDTIME   doxycycline (VIBRA-TABS) 100 MG tablet Take 1 tablet (100 mg total) by mouth 2 (two) times daily. (Patient not taking: Reported on 07/17/2021)   fexofenadine-pseudoephedrine (ALLEGRA-D 24) 180-240 MG 24 hr tablet Take 1 tablet by mouth every evening. For allergy and congestion (Patient not taking:  Reported on 07/17/2021)   gabapentin (NEURONTIN) 300 MG capsule Take 1 capsule (300 mg total) by mouth 3 (three) times daily. (Patient not taking: Reported on 07/17/2021)   pantoprazole (PROTONIX) 40 MG tablet Take 1 tablet (40 mg total) by mouth daily. (Patient not taking: Reported on 01/19/2021)   promethazine-dextromethorphan (PROMETHAZINE-DM) 6.25-15 MG/5ML syrup Take 5 mLs by mouth 4 (four)  times daily as needed for cough.   Vitamin D, Ergocalciferol, (DRISDOL) 1.25 MG (50000 UNIT) CAPS capsule Take 1 capsule (50,000 Units total) by mouth every 7 (seven) days. (Patient not taking: Reported on 07/17/2021)   [DISCONTINUED] famotidine (PEPCID) 20 MG tablet Take 1 tablet (20 mg total) by mouth 2 (two) times daily for 14 days.   [DISCONTINUED] ondansetron (ZOFRAN ODT) 4 MG disintegrating tablet Take 1 tablet (4 mg total) by mouth every 8 (eight) hours as needed. (Patient not taking: Reported on 07/17/2021)   [DISCONTINUED] Testosterone (ANDROGEL PUMP) 20.25 MG/ACT (1.62%) GEL Apply 4 pumps daily to upper chest and shoulders (Patient not taking: Reported on 06/02/2020)   No facility-administered encounter medications on file as of 07/17/2021.    Allergies (verified) Penicillins and Chantix [varenicline]   History: Past Medical History:  Diagnosis Date   Anxiety    Asthma    Depression    High cholesterol    Hypertension    Low testosterone    Median nerve dysfunction    Radiculopathy of cervical region    Thyroid disease    Ulnar neuropathy at elbow    Weakness    Past Surgical History:  Procedure Laterality Date   BICEPS TENDON REPAIR     NECK SURGERY     Fusion done two seperate times   ROTATOR CUFF REPAIR Right    Family History  Problem Relation Age of Onset   Hypertension Mother    Stroke Mother 38   Arrhythmia Father    COPD Father    Asthma Brother    Bipolar disorder Brother    Asthma Daughter    Fibromyalgia Sister    Social History   Socioeconomic History   Marital status: Divorced    Spouse name: Not on file   Number of children: 1   Years of education: GED   Highest education level: GED or equivalent  Occupational History   Occupation: Disability    Comment: Worked as Advice worker  Tobacco Use   Smoking status: Every Day    Packs/day: 0.50    Years: 30.00    Pack years: 15.00    Types: Cigarettes    Last attempt to quit:  07/23/2017    Years since quitting: 3.9   Smokeless tobacco: Former    Types: Snuff, Chew  Vaping Use   Vaping Use: Never used  Substance and Sexual Activity   Alcohol use: No   Drug use: No   Sexual activity: Yes  Other Topics Concern   Not on file  Social History Narrative   Lives with brother and daughter   Social Determinants of Health   Financial Resource Strain: Low Risk    Difficulty of Paying Living Expenses: Not hard at all  Food Insecurity: No Food Insecurity   Worried About Programme researcher, broadcasting/film/video in the Last Year: Never true   Ran Out of Food in the Last Year: Never true  Transportation Needs: No Transportation Needs   Lack of Transportation (Medical): No   Lack of Transportation (Non-Medical): No  Physical Activity: Inactive   Days of Exercise per Week:  0 days   Minutes of Exercise per Session: 0 min  Stress: No Stress Concern Present   Feeling of Stress : Only a little  Social Connections: Socially Isolated   Frequency of Communication with Friends and Family: More than three times a week   Frequency of Social Gatherings with Friends and Family: Once a week   Attends Religious Services: Never   Database administrator or Organizations: No   Attends Engineer, structural: Never   Marital Status: Divorced    Tobacco Counseling Ready to quit: Not Answered Counseling given: Not Answered   Clinical Intake:  Pre-visit preparation completed: Yes  Pain : 0-10 Pain Score: 6  Pain Type: Chronic pain Pain Location: Back Pain Orientation: Left Pain Descriptors / Indicators: Aching, Discomfort Pain Onset: More than a month ago Pain Frequency: Intermittent     BMI - recorded: 53.98 Nutritional Status: BMI > 30  Obese Nutritional Risks: None Diabetes: No  How often do you need to have someone help you when you read instructions, pamphlets, or other written materials from your doctor or pharmacy?: 1 - Never  Diabetic? No  Interpreter Needed?:  No  Information entered by :: Moustapha Tooker, LPN   Activities of Daily Living In your present state of health, do you have any difficulty performing the following activities: 07/17/2021  Hearing? N  Vision? Y  Comment plans to make appt with eye dr soon  Difficulty concentrating or making decisions? N  Walking or climbing stairs? N  Dressing or bathing? N  Doing errands, shopping? N  Preparing Food and eating ? N  Using the Toilet? N  In the past six months, have you accidently leaked urine? N  Do you have problems with loss of bowel control? N  Managing your Medications? N  Managing your Finances? N  Housekeeping or managing your Housekeeping? N  Some recent data might be hidden    Patient Care Team: Mechele Claude, MD as PCP - General (Family Medicine) Gwenith Daily, RN as Registered Nurse  Indicate any recent Medical Services you may have received from other than [facility] providers in the past year (date may be approximate).     Assessment:   This is a routine wellness examination for [place].  Hearing/Vision screen Hearing Screening - Comments:: Denies hearing difficulties Vision Screening - Comments:: C/o moderate vision difficulties - does not have an eye doctor, but plans to find one and make appt soon - declined referral  Dietary issues and exercise activities discussed: Current Exercise Habits: The patient does not participate in regular exercise at present, Exercise limited by: orthopedic condition(s);respiratory conditions(s)   Goals Addressed             This Visit's Progress    DIET - INCREASE WATER INTAKE   Not on track    Exercise 3x per week (60 min per time)   Not on track    Join [employer] and attend classes 3 times per week for 1 hour.      Patient Stated       He wants to lose weight, be healthy and raise his daughter well, and be around when she is grown       Depression Screen PHQ 2/9 Scores 07/17/2021 03/13/2021 03/13/2021 01/19/2021  12/14/2020 11/14/2020 10/03/2020  PHQ - 2 Score 2 2 0 0 3 3 3   PHQ- 9 Score 4 5 - - 9 7 11   Exception Documentation - - - - - - -  Fall Risk Fall Risk  07/17/2021 03/13/2021 01/19/2021 12/14/2020 11/14/2020  Falls in the past year? 0 0 0 0 0  Number falls in past yr: 0 - - - -  Injury with Fall? 0 - - - -  Risk for fall due to : Orthopedic patient;Medication side effect;Impaired vision - - - -  Follow up Falls prevention discussed - - - -    FALL RISK PREVENTION PERTAINING TO THE HOME:  Any stairs in or around the home? Yes  If so, are there any without handrails? No  Home free of loose throw rugs in walkways, pet beds, electrical cords, etc? Yes  Adequate lighting in your home to reduce risk of falls? Yes   ASSISTIVE DEVICES UTILIZED TO PREVENT FALLS:  Life alert? No  Use of a cane, walker or w/c? No  Grab bars in the bathroom? No  Shower chair or bench in shower? No  Elevated toilet seat or a handicapped toilet? No   TIMED UP AND GO:  Was the test performed? No . Telephonic visit  Cognitive Function: Normal cognitive status assessed by direct observation by this Nurse Health Advisor. No abnormalities found.    MMSE - Mini Mental State Exam 10/22/2017  Orientation to time 5  Orientation to Place 5  Registration 3  Attention/ Calculation 3  Recall 3  Language- name 2 objects 2  Language- repeat 1  Language- follow 3 step command 3  Language- read & follow direction 1  Write a sentence 1  Copy design 1  Total score 28        Immunizations Immunization History  Administered Date(s) Administered   Influenza,inj,Quad PF,6+ Mos 07/01/2018   Tdap 11/16/2014    TDAP status: Up to date  Flu Vaccine status: Due, Education has been provided regarding the importance of this vaccine. Advised may receive this vaccine at local pharmacy or Health Dept. Aware to provide a copy of the vaccination record if obtained from local pharmacy or Health Dept. Verbalized acceptance and  understanding.  Covid-19 vaccine status: Declined, Education has been provided regarding the importance of this vaccine but patient still declined. Advised may receive this vaccine at local pharmacy or Health Dept.or vaccine clinic. Aware to provide a copy of the vaccination record if obtained from local pharmacy or Health Dept. Verbalized acceptance and understanding.  Qualifies for Shingles Vaccine? No   Zostavax completed No    Screening Tests Health Maintenance  Topic Date Due   COLONOSCOPY (Pts 45-41yrs Insurance coverage will need to be confirmed)  Never done   INFLUENZA VACCINE  04/23/2021   COVID-19 Vaccine (1) 08/02/2021 (Originally 11/21/1971)   Hepatitis C Screening  11/14/2021 (Originally 05/25/1991)   Pneumococcal Vaccine 47-66 Years old (1 - PCV) 03/13/2022 (Originally 05/25/1979)   TETANUS/TDAP  11/16/2024   HIV Screening  Completed   HPV VACCINES  Aged Out    Health Maintenance  Health Maintenance Due  Topic Date Due   COLONOSCOPY (Pts 45-52yrs Insurance coverage will need to be confirmed)  Never done   INFLUENZA VACCINE  04/23/2021    Colonoscopy due at age 53  Lung Cancer Screening: (Low Dose CT Chest recommended if Age 43-80 years, 30 pack-year currently smoking OR have quit w/in 15years.) does not qualify  Additional Screening:  Hepatitis C Screening: does not qualify  Vision Screening: Recommended annual ophthalmology exams for early detection of glaucoma and other disorders of the eye. Is the patient up to date with their annual eye exam?  No  Who is the provider or what is the name of the office in which the patient attends annual eye exams? none If pt is not established with a provider, would they like to be referred to a provider to establish care? No .   Dental Screening: Recommended annual dental exams for proper oral hygiene  Community Resource Referral / Chronic Care Management: CRR required this visit?  No   CCM required this visit?  No       Plan:     I have personally reviewed and noted the following in the patient's chart:   Medical and social history Use of alcohol, tobacco or illicit drugs  Current medications and supplements including opioid prescriptions. Patient is not currently taking opioid prescriptions. Functional ability and status Nutritional status Physical activity Advanced directives List of other physicians Hospitalizations, surgeries, and ER visits in previous 12 months Vitals Screenings to include cognitive, depression, and falls Referrals and appointments  In addition, I have reviewed and discussed with patient certain preventive protocols, quality metrics, and best practice recommendations. A written personalized care plan for preventive services as well as general preventive health recommendations were provided to patient.     [place] Constable, LPN   [id number]/[id number]   Nurse Notes: None

## 2021-08-07 ENCOUNTER — Other Ambulatory Visit: Payer: Self-pay | Admitting: Family Medicine

## 2021-08-07 DIAGNOSIS — I10 Essential (primary) hypertension: Secondary | ICD-10-CM

## 2021-08-13 ENCOUNTER — Other Ambulatory Visit: Payer: Self-pay | Admitting: Family Medicine

## 2021-08-13 DIAGNOSIS — J418 Mixed simple and mucopurulent chronic bronchitis: Secondary | ICD-10-CM

## 2021-08-20 ENCOUNTER — Other Ambulatory Visit: Payer: Self-pay | Admitting: Family Medicine

## 2021-09-04 ENCOUNTER — Ambulatory Visit (INDEPENDENT_AMBULATORY_CARE_PROVIDER_SITE_OTHER): Payer: Medicare HMO | Admitting: Family Medicine

## 2021-09-04 ENCOUNTER — Encounter: Payer: Self-pay | Admitting: Family Medicine

## 2021-09-04 VITALS — BP 119/72 | HR 66 | Temp 97.5°F | Ht 68.0 in | Wt 353.6 lb

## 2021-09-04 DIAGNOSIS — I10 Essential (primary) hypertension: Secondary | ICD-10-CM | POA: Diagnosis not present

## 2021-09-04 DIAGNOSIS — E349 Endocrine disorder, unspecified: Secondary | ICD-10-CM

## 2021-09-04 DIAGNOSIS — M25562 Pain in left knee: Secondary | ICD-10-CM

## 2021-09-04 DIAGNOSIS — Z1211 Encounter for screening for malignant neoplasm of colon: Secondary | ICD-10-CM

## 2021-09-04 DIAGNOSIS — K219 Gastro-esophageal reflux disease without esophagitis: Secondary | ICD-10-CM

## 2021-09-04 DIAGNOSIS — E559 Vitamin D deficiency, unspecified: Secondary | ICD-10-CM | POA: Diagnosis not present

## 2021-09-04 DIAGNOSIS — F411 Generalized anxiety disorder: Secondary | ICD-10-CM | POA: Diagnosis not present

## 2021-09-04 DIAGNOSIS — G8929 Other chronic pain: Secondary | ICD-10-CM

## 2021-09-04 DIAGNOSIS — E039 Hypothyroidism, unspecified: Secondary | ICD-10-CM

## 2021-09-04 DIAGNOSIS — E782 Mixed hyperlipidemia: Secondary | ICD-10-CM

## 2021-09-04 MED ORDER — TRAZODONE HCL 100 MG PO TABS: 100.0000 mg | ORAL_TABLET | Freq: Every day | ORAL | 3 refills | Status: DC

## 2021-09-04 MED ORDER — ALPRAZOLAM 0.5 MG PO TABS
0.5000 mg | ORAL_TABLET | Freq: Three times a day (TID) | ORAL | 5 refills | Status: DC | PRN
Start: 2021-09-04 — End: 2022-03-05

## 2021-09-04 MED ORDER — CARVEDILOL 6.25 MG PO TABS: 6.2500 mg | ORAL_TABLET | Freq: Two times a day (BID) | ORAL | 2 refills | Status: DC

## 2021-09-04 MED ORDER — PANTOPRAZOLE SODIUM 40 MG PO TBEC: 40.0000 mg | DELAYED_RELEASE_TABLET | Freq: Every day | ORAL | 3 refills | Status: DC

## 2021-09-04 MED ORDER — DEXLANSOPRAZOLE 60 MG PO CPDR
1.0000 | DELAYED_RELEASE_CAPSULE | Freq: Every day | ORAL | 3 refills | Status: DC
Start: 2021-09-04 — End: 2022-04-04

## 2021-09-04 NOTE — Progress Notes (Signed)
Subjective:  Patient ID: Roy [name],  male    DOB: [birth year]  Age: 50 y.o.    CC: Medical Management of Chronic Issues   HPI Roy [name] presents for  follow-up of hypertension. Patient has no history of headache chest pain or shortness of breath or recent cough. Patient also denies symptoms of TIA such as numbness weakness lateralizing. Patient denies side effects from medication. States taking it regularly.  Patient also  in for follow-up of elevated cholesterol. Doing well without complaints on current medication. Denies side effects  including myalgia and arthralgia and nausea. Also in today for liver function testing. Currently no chest pain, shortness of breath or other cardiovascular related symptoms noted.   follow-up on  thyroid. The patient has a history of hypothyroidism for many years. It has been stable recently. Pt. denies any change in  voice, loss of hair, heat or cold intolerance. Energy level has been adequate to good. Patient denies constipation and diarrhea. No myxedema. Medication is as noted below. Verified that pt is taking it daily on an empty stomach. Well tolerated.   LEft knee pain for 3 mos. Gives out if he walks around [place], etc. Pops frequently.  History Roy [name] has a past medical history of Anxiety, Asthma, Depression, High cholesterol, Hypertension, Low testosterone, Median nerve dysfunction, Radiculopathy of cervical region, Thyroid disease, Ulnar neuropathy at elbow, and Weakness.   He has a past surgical history that includes Rotator cuff repair (Right); Biceps tendon repair; and Neck surgery.   His family history includes Arrhythmia in his father; Asthma in his brother and daughter; Bipolar disorder in his brother; COPD in his father; Fibromyalgia in his sister; Hypertension in his mother; Stroke (age of onset: 70) in his mother.He reports that he has been smoking cigarettes. He has a 15.00 pack-year smoking history. He has quit using smokeless  tobacco.  His smokeless tobacco use included snuff and chew. He reports that he does not drink alcohol and does not use drugs.  Current Outpatient Medications on File Prior to Visit  Medication Sig Dispense Refill   acetaminophen (TYLENOL) 500 MG tablet Take 1,000 mg by mouth every 6 (six) hours as needed for headache (pain).      albuterol (VENTOLIN HFA) 108 (90 Base) MCG/ACT inhaler Inhale 1-2 puffs into the lungs every 6 (six) hours as needed for wheezing or shortness of breath. 18 g 5   ALPRAZolam (XANAX) 0.5 MG tablet Take 1 tablet (0.5 mg total) by mouth 3 (three) times daily as needed. for anxiety 90 tablet 5   carvedilol (COREG) 6.25 MG tablet TAKE 1 TABLET BY MOUTH TWICE DAILY WITH A MEAL 180 tablet 0   dexlansoprazole (DEXILANT) 60 MG capsule Take 1 capsule by mouth once daily 90 capsule 0   DULoxetine (CYMBALTA) 60 MG capsule Take 2 capsules by mouth once daily 180 capsule 1   gemfibrozil (LOPID) 600 MG tablet Take 1 tablet by mouth twice daily 180 tablet 0   levothyroxine (SYNTHROID) 50 MCG tablet Take 1 tablet by mouth once daily 90 tablet 1   linaclotide (LINZESS) 290 MCG CAPS capsule Take 1 capsule (290 mcg total) by mouth daily as needed (constipation). 90 capsule 1   promethazine-dextromethorphan (PROMETHAZINE-DM) 6.25-15 MG/5ML syrup Take 5 mLs by mouth 4 (four) times daily as needed for cough. 118 mL 0   QUEtiapine (SEROQUEL) 200 MG tablet TAKE 1 TABLET BY MOUTH AT BEDTIME FOR NERVES 90 tablet 3   sildenafil (REVATIO) 20 MG tablet Take  2-5 tablets daily as needed for sex 180 tablet 1   SYMBICORT 160-4.5 MCG/ACT inhaler INHALE 2 PUFFS INTO LUNGS TWICE DAILY FOR ASTHMA 11 g 0   traZODone (DESYREL) 100 MG tablet TAKE 1 TABLET BY MOUTH AT BEDTIME 90 tablet 0   Vitamin D, Ergocalciferol, (DRISDOL) 1.25 MG (50000 UNIT) CAPS capsule Take 1 capsule (50,000 Units total) by mouth every 7 (seven) days. 13 capsule 3   pantoprazole (PROTONIX) 40 MG tablet Take 1 tablet (40 mg total) by mouth  daily. (Patient not taking: Reported on 01/19/2021) 30 tablet 0   [DISCONTINUED] famotidine (PEPCID) 20 MG tablet Take 1 tablet (20 mg total) by mouth 2 (two) times daily for 14 days. 28 tablet 0   [DISCONTINUED] Testosterone (ANDROGEL PUMP) 20.25 MG/ACT (1.62%) GEL Apply 4 pumps daily to upper chest and shoulders (Patient not taking: Reported on 06/02/2020) 300 g 5   No current facility-administered medications on file prior to visit.    ROS Review of Systems  Constitutional:  Negative for fever.  Respiratory:  Negative for shortness of breath.   Cardiovascular:  Negative for chest pain.  Musculoskeletal:  Negative for arthralgias.  Skin:  Negative for rash.   Objective:  BP 119/72   Pulse 66   Temp (!) 97.5 F (36.4 C)   Ht _0  (1.727 m)   Wt (!) 353 lb 9.6 oz (160.4 kg)   SpO2 96%   BMI 53.76 kg/m   BP Readings from Last 3 Encounters:  09/04/21 119/72  07/02/21 (!) 153/81  03/13/21 110/71    Wt Readings from Last 3 Encounters:  09/04/21 (!) 353 lb 9.6 oz (160.4 kg)  07/17/21 (!) 355 lb (161 kg)  03/13/21 (!) 355 lb 6.4 oz (161.2 kg)     Physical Exam Vitals reviewed.  Constitutional:      Appearance: He is well-developed.  HENT:     Head: Normocephalic and atraumatic.     Right Ear: External ear normal.     Left Ear: External ear normal.     Mouth/Throat:     Pharynx: No oropharyngeal exudate or posterior oropharyngeal erythema.  Eyes:     Pupils: Pupils are equal, round, and reactive to light.  Cardiovascular:     Rate and Rhythm: Normal rate and regular rhythm.     Heart sounds: No murmur heard. Pulmonary:     Effort: No respiratory distress.     Breath sounds: Normal breath sounds.  Musculoskeletal:     Cervical back: Normal range of motion and neck supple.  Neurological:     Mental Status: He is alert and oriented to person, place, and time.    Assessment & Plan:   Roy [name] was seen today for medical management of chronic issues.  Diagnoses and  all orders for this visit:  Testosterone deficiency -     Testosterone,Free and Total  Benign essential HTN -     CBC with Differential/Platelet -     CMP14+EGFR  Hypothyroidism, unspecified type -     TSH + free T4  Vitamin D deficiency -     VITAMIN D 25 Hydroxy (Vit-D Deficiency, Fractures)  Mixed hyperlipidemia -     Lipid panel  GAD (generalized anxiety disorder)  Gastroesophageal reflux disease, unspecified whether esophagitis present   I have discontinued [name]. Stahnke's celecoxib, gabapentin, fexofenadine-pseudoephedrine, and doxycycline. I am also having him maintain his acetaminophen, sildenafil, linaclotide, Vitamin D (Ergocalciferol), albuterol, pantoprazole, ALPRAZolam, DULoxetine, QUEtiapine, traZODone, promethazine-dextromethorphan, levothyroxine, dexlansoprazole, carvedilol, Symbicort, and gemfibrozil.  No orders of the defined types were placed in this encounter.    Follow-up: No follow-ups on file.  Claretta Fraise, M.D.

## 2021-09-06 ENCOUNTER — Ambulatory Visit: Payer: Medicare HMO | Admitting: Family Medicine

## 2021-09-06 LAB — CMP14+EGFR
ALT: 18 IU/L (ref 0–44)
AST: 18 IU/L (ref 0–40)
Albumin/Globulin Ratio: 2.2 (ref 1.2–2.2)
Albumin: 4.7 g/dL (ref 4.0–5.0)
Alkaline Phosphatase: 75 IU/L (ref 44–121)
BUN/Creatinine Ratio: 13 (ref 9–20)
BUN: 11 mg/dL (ref 6–24)
Bilirubin Total: 0.3 mg/dL (ref 0.0–1.2)
CO2: 22 mmol/L (ref 20–29)
Calcium: 9.5 mg/dL (ref 8.7–10.2)
Chloride: 102 mmol/L (ref 96–106)
Creatinine, Ser: 0.85 mg/dL (ref 0.76–1.27)
Globulin, Total: 2.1 g/dL (ref 1.5–4.5)
Glucose: 141 mg/dL — ABNORMAL HIGH (ref 70–99)
Potassium: 4.2 mmol/L (ref 3.5–5.2)
Sodium: 139 mmol/L (ref 134–144)
Total Protein: 6.8 g/dL (ref 6.0–8.5)
eGFR: 107 mL/min/{1.73_m2} (ref >59)

## 2021-09-06 LAB — CBC WITH DIFFERENTIAL/PLATELET
Basophils Absolute: 0 10*3/uL (ref 0.0–0.2)
Basos: 1 %
EOS (ABSOLUTE): 0.1 10*3/uL (ref 0.0–0.4)
Eos: 3 %
Hematocrit: 40.2 % (ref 37.5–51.0)
Hemoglobin: 13.8 g/dL (ref 13.0–17.7)
Immature Grans (Abs): 0 10*3/uL (ref 0.0–0.1)
Immature Granulocytes: 0 %
Lymphocytes Absolute: 1.5 10*3/uL (ref 0.7–3.1)
Lymphs: 31 %
MCH: 30.9 pg (ref 26.6–33.0)
MCHC: 34.3 g/dL (ref 31.5–35.7)
MCV: 90 fL (ref 79–97)
Monocytes Absolute: 0.3 10*3/uL (ref 0.1–0.9)
Monocytes: 6 %
Neutrophils Absolute: 2.8 10*3/uL (ref 1.4–7.0)
Neutrophils: 59 %
Platelets: 270 10*3/uL (ref 150–450)
RBC: 4.46 x10E6/uL (ref 4.14–5.80)
RDW: 12.6 % (ref 11.6–15.4)
WBC: 4.8 10*3/uL (ref 3.4–10.8)

## 2021-09-06 LAB — LIPID PANEL
Chol/HDL Ratio: 4.9 ratio (ref 0.0–5.0)
Cholesterol, Total: 186 mg/dL (ref 100–199)
HDL: 38 mg/dL — ABNORMAL LOW (ref >39)
LDL Chol Calc (NIH): 110 mg/dL — ABNORMAL HIGH (ref 0–99)
Triglycerides: 221 mg/dL — ABNORMAL HIGH (ref 0–149)
VLDL Cholesterol Cal: 38 mg/dL (ref 5–40)

## 2021-09-06 LAB — TSH+FREE T4
Free T4: 0.94 ng/dL (ref 0.82–1.77)
TSH: 2.2 u[IU]/mL (ref 0.450–4.500)

## 2021-09-06 LAB — TESTOSTERONE,FREE AND TOTAL
Testosterone, Free: 3.9 pg/mL — ABNORMAL LOW (ref 6.8–21.5)
Testosterone: 175 ng/dL — ABNORMAL LOW (ref 264–916)

## 2021-09-06 LAB — VITAMIN D 25 HYDROXY (VIT D DEFICIENCY, FRACTURES): Vit D, 25-Hydroxy: 22.8 ng/mL — ABNORMAL LOW (ref 30.0–100.0)

## 2021-09-12 ENCOUNTER — Other Ambulatory Visit: Payer: Self-pay | Admitting: Family Medicine

## 2021-09-12 ENCOUNTER — Encounter: Payer: Self-pay | Admitting: Family Medicine

## 2021-09-12 MED ORDER — ROSUVASTATIN CALCIUM 5 MG PO TABS: 5.0000 mg | ORAL_TABLET | Freq: Every day | ORAL | 3 refills | Status: DC

## 2021-09-12 MED ORDER — VITAMIN D (ERGOCALCIFEROL) 1.25 MG (50000 UNIT) PO CAPS: 50000.0000 [IU] | ORAL_CAPSULE | ORAL | 3 refills | Status: DC

## 2021-09-13 ENCOUNTER — Encounter: Payer: Self-pay | Admitting: Family Medicine

## 2021-09-13 LAB — SPECIMEN STATUS REPORT

## 2021-09-14 ENCOUNTER — Other Ambulatory Visit: Payer: Medicare HMO

## 2021-09-14 ENCOUNTER — Encounter: Payer: Self-pay | Admitting: Family Medicine

## 2021-09-14 ENCOUNTER — Other Ambulatory Visit: Payer: Self-pay

## 2021-09-14 DIAGNOSIS — R739 Hyperglycemia, unspecified: Secondary | ICD-10-CM | POA: Diagnosis not present

## 2021-09-14 LAB — BAYER DCA HB A1C WAIVED: HB A1C (BAYER DCA - WAIVED): 6.2 % — ABNORMAL HIGH (ref 4.8–5.6)

## 2021-09-17 ENCOUNTER — Other Ambulatory Visit: Payer: Self-pay | Admitting: Family Medicine

## 2021-09-17 DIAGNOSIS — J418 Mixed simple and mucopurulent chronic bronchitis: Secondary | ICD-10-CM

## 2021-09-20 ENCOUNTER — Ambulatory Visit: Payer: Medicare HMO | Admitting: Orthopedic Surgery

## 2021-09-20 ENCOUNTER — Ambulatory Visit: Payer: Medicare HMO

## 2021-09-20 ENCOUNTER — Other Ambulatory Visit: Payer: Self-pay

## 2021-09-20 ENCOUNTER — Encounter: Payer: Self-pay | Admitting: Orthopedic Surgery

## 2021-09-20 VITALS — Ht 68.0 in | Wt 353.0 lb

## 2021-09-20 DIAGNOSIS — G8929 Other chronic pain: Secondary | ICD-10-CM

## 2021-09-20 DIAGNOSIS — M1712 Unilateral primary osteoarthritis, left knee: Secondary | ICD-10-CM | POA: Diagnosis not present

## 2021-09-20 DIAGNOSIS — M25562 Pain in left knee: Secondary | ICD-10-CM

## 2021-09-20 DIAGNOSIS — S83242A Other tear of medial meniscus, current injury, left knee, initial encounter: Secondary | ICD-10-CM

## 2021-09-20 MED ORDER — MELOXICAM 7.5 MG PO TABS: 7.5000 mg | ORAL_TABLET | Freq: Every day | ORAL | 5 refills | Status: DC

## 2021-09-20 NOTE — Progress Notes (Signed)
Chief Complaint  Patient presents with   Knee Pain    Lt knee pain for     HPI: 50 year old male pain in his left knee for 6 to 8 months associated with medial sided pain popping with every step giving way especially on steps difficulty walking such as going on shopping trips.  Presents from Dr. Darlyn Read for consultation  Past Medical History:  Diagnosis Date   Anxiety    Asthma    Depression    High cholesterol    Hypertension    Low testosterone    Median nerve dysfunction    Radiculopathy of cervical region    Thyroid disease    Ulnar neuropathy at elbow    Weakness     Past Medical History:  Diagnosis Date   Anxiety    Asthma    Depression    High cholesterol    Hypertension    Low testosterone    Median nerve dysfunction    Radiculopathy of cervical region    Thyroid disease    Ulnar neuropathy at elbow    Weakness     Social History   Tobacco Use   Smoking status: Every Day    Packs/day: 0.50    Years: 30.00    Pack years: 15.00    Types: Cigarettes    Last attempt to quit: 07/23/2017    Years since quitting: 4.1   Smokeless tobacco: Former    Types: Snuff, Chew  Vaping Use   Vaping Use: Never used  Substance Use Topics   Alcohol use: No   Drug use: No     Meds ordered this encounter  Medications   meloxicam (MOBIC) 7.5 MG tablet    Sig: Take 1 tablet (7.5 mg total) by mouth daily.    Dispense:  30 tablet    Refill:  5     Ht 5\' 8"  (1.727 m)    Wt (!) 353 lb (160.1 kg)    BMI 53.67 kg/m    General appearance: Well-developed well-nourished no gross deformities  Cardiovascular normal pulse and perfusion normal color without edema  Neurologically no sensation loss or deficits or pathologic reflexes  Psychological: Awake alert and oriented x3 mood and affect normal  Skin no lacerations or ulcerations no nodularity no palpable masses, no erythema or nodularity  Musculoskeletal: Cannot detect whether the patient has effusion or not  because of the size of the leg he does have tenderness over the medial joint line he flexes the knee to 100 degrees it seems to come to full extension it otherwise feels stable.  He does have positive meniscal signs from McMurray's and the hyperextension test  Imaging x-rays show medial compartment narrowing compared to lateral  A/P  Appears to have a torn medial meniscus left knee  Encounter Diagnoses  Name Primary?   Chronic pain of left knee Yes   Acute medial meniscus tear of left knee, initial encounter    Primary osteoarthritis of left knee    Meds ordered this encounter  Medications   meloxicam (MOBIC) 7.5 MG tablet    Sig: Take 1 tablet (7.5 mg total) by mouth daily.    Dispense:  30 tablet    Refill:  5    I started him on meloxicam I sent him for open unit MRI.

## 2021-09-25 DIAGNOSIS — M948X6 Other specified disorders of cartilage, lower leg: Secondary | ICD-10-CM | POA: Diagnosis not present

## 2021-09-25 DIAGNOSIS — S83282A Other tear of lateral meniscus, current injury, left knee, initial encounter: Secondary | ICD-10-CM | POA: Diagnosis not present

## 2021-09-25 DIAGNOSIS — R600 Localized edema: Secondary | ICD-10-CM | POA: Diagnosis not present

## 2021-09-25 DIAGNOSIS — S83242A Other tear of medial meniscus, current injury, left knee, initial encounter: Secondary | ICD-10-CM | POA: Diagnosis not present

## 2021-09-25 DIAGNOSIS — M25462 Effusion, left knee: Secondary | ICD-10-CM | POA: Diagnosis not present

## 2021-09-25 DIAGNOSIS — M7122 Synovial cyst of popliteal space [Baker], left knee: Secondary | ICD-10-CM | POA: Diagnosis not present

## 2021-09-26 NOTE — Progress Notes (Signed)
MRI Knee Left  WO IV Contrast  Anatomical Region Laterality Modality  Thigh -- Magnetic Resonance  Knee -- --  Leg -- --   Impression  IMPRESSION:  1. Medial meniscus tear.  2. Small radial tear lateral meniscus.  3. Patchy full-thickness cartilage loss in the medial and patellofemoral compartments.  4. Likely stress-related bone marrow edema in the medial tibial plateau, greater than femoral condyle.  5. Small lateral femoral condyle cartilage defect, with subchondral bone marrow edema.  6. Moderate joint effusion and Baker's cyst.   Electronically Signed by: Oretha Milch on 09/26/2021 10:52 AM Narrative  HISTORY:  Other tear of medial meniscus, current injury, left knee, initial encounter   TECHNIQUE:  MRI of the left knee without contrast.   COMPARISON: None.   FINDINGS:  Bones: Subchondral bone marrow edema and sclerosis in the medial tibial plateau. Low-grade bone marrow edema in the medial femoral condyle. Bone marrow edema in the lateral femoral condyle marginating the intercondylar notch.  Cartilage: Full-thickness cartilage loss along the medial weightbearing surfaces of the medial tibial plateau and medial femoral condyle. Patchy areas of full-thickness patellar cartilage loss. Mostly grade 2 lateral compartment chondromalacia, probable small lateral femoral condyle cartilage defect measuring 8 mm..  Joint: Moderate joint effusion..  ACL: Intact.  PCL: Intact.  MCL: Intact.  LCL complex: Intact.  Medial meniscus: Attenuation/intrasubstance tearing of the meniscal body and posterior horn. Meniscal extrusion.  Lateral meniscus: Small radial tear along the free edge of the meniscal body.  Extensor mechanism: Intact.  Other: Popliteal fossa Baker's cyst, 6.8 cm.. Procedure Note  Oretha Milch, MD - 09/26/2021  Formatting of this note might be different from the original.  HISTORY:  Other tear of medial meniscus, current injury, left knee, initial encounter   TECHNIQUE:   MRI of the left knee without contrast.   COMPARISON: None.   FINDINGS:  Bones: Subchondral bone marrow edema and sclerosis in the medial tibial plateau. Low-grade bone marrow edema in the medial femoral condyle. Bone marrow edema in the lateral femoral condyle marginating the intercondylar notch.  Cartilage: Full-thickness cartilage loss along the medial weightbearing surfaces of the medial tibial plateau and medial femoral condyle. Patchy areas of full-thickness patellar cartilage loss. Mostly grade 2 lateral compartment chondromalacia, probable small lateral femoral condyle cartilage defect measuring 8 mm..  Joint: Moderate joint effusion..  ACL: Intact.  PCL: Intact.  MCL: Intact.  LCL complex: Intact.  Medial meniscus: Attenuation/intrasubstance tearing of the meniscal body and posterior horn. Meniscal extrusion.  Lateral meniscus: Small radial tear along the free edge of the meniscal body.  Extensor mechanism: Intact.  Other: Popliteal fossa Baker's cyst, 6.8 cm..    IMPRESSION:  1. Medial meniscus tear.  2. Small radial tear lateral meniscus.  3. Patchy full-thickness cartilage loss in the medial and patellofemoral compartments.  4. Likely stress-related bone marrow edema in the medial tibial plateau, greater than femoral condyle.  5. Small lateral femoral condyle cartilage defect, with subchondral bone marrow edema.  6. Moderate joint effusion and Baker's cyst.   Electronically Signed by: Oretha Milch on 09/26/2021 10:52 AM Resulting Agency Comment  [id number] Exam End: 09/25/21 13:09

## 2021-09-27 ENCOUNTER — Encounter: Payer: Self-pay | Admitting: Orthopedic Surgery

## 2021-09-27 ENCOUNTER — Other Ambulatory Visit: Payer: Self-pay | Admitting: Orthopedic Surgery

## 2021-09-27 ENCOUNTER — Other Ambulatory Visit: Payer: Self-pay

## 2021-09-27 ENCOUNTER — Other Ambulatory Visit: Payer: Self-pay | Admitting: Family Medicine

## 2021-09-27 ENCOUNTER — Ambulatory Visit: Payer: Medicare HMO | Admitting: Orthopedic Surgery

## 2021-09-27 DIAGNOSIS — S83242D Other tear of medial meniscus, current injury, left knee, subsequent encounter: Secondary | ICD-10-CM | POA: Diagnosis not present

## 2021-09-27 DIAGNOSIS — S83282D Other tear of lateral meniscus, current injury, left knee, subsequent encounter: Secondary | ICD-10-CM | POA: Diagnosis not present

## 2021-09-27 DIAGNOSIS — M5416 Radiculopathy, lumbar region: Secondary | ICD-10-CM

## 2021-09-27 DIAGNOSIS — M1710 Unilateral primary osteoarthritis, unspecified knee: Secondary | ICD-10-CM

## 2021-09-27 DIAGNOSIS — F411 Generalized anxiety disorder: Secondary | ICD-10-CM

## 2021-09-27 DIAGNOSIS — M1712 Unilateral primary osteoarthritis, left knee: Secondary | ICD-10-CM | POA: Diagnosis not present

## 2021-09-27 NOTE — Progress Notes (Signed)
Chief Complaint  Patient presents with   Knee Pain    left   Results    Review MRI     Encounter Diagnoses  Name Primary?   Acute medial meniscus tear of left knee, subsequent encounter    Acute lateral meniscus tear of left knee, subsequent encounter    Morbid obesity (HCC)    Primary localized osteoarthritis of knee Yes   Roy [name] returns for MRI follow-up with continued complaints of left knee pain.  He is 51 years old he has a BMI of 53.  I reviewed his MRI the report is listed below  Although the images were somewhat hazy because of the disc I was able to see that his meniscus was torn medially he had a small radial tear of the lateral meniscus he had full-thickness cartilage loss in the patellofemoral and medial compartments with bone marrow edema on the tibia and femur  The lateral side also had a condylar defect with subchondral bone edema  Report findings: IMPRESSION:  1. Medial meniscus tear.  2. Small radial tear lateral meniscus.  3. Patchy full-thickness cartilage loss in the medial and patellofemoral compartments.  4. Likely stress-related bone marrow edema in the medial tibial plateau, greater than femoral condyle.  5. Small lateral femoral condyle cartilage defect, with subchondral bone marrow edema.  6. Moderate joint effusion and Baker's cyst.     I discussed this with him at length arthroscopy versus knee replacement are the surgical options however his BMI is 41 and his age is 70.  So his knee replacement options are minimal at this point  Recommend arthroscopic surgery with medial and lateral meniscectomies debridements 8 weeks of protected weightbearing and I told him we could probably get him 60% better but then he did need ongoing treatment  He is agreeable to this and we will set up the surgery for arthroscopy left knee and partial medial and lateral meniscectomies

## 2021-09-27 NOTE — Patient Instructions (Addendum)
Your surgery will be at [facility] by Dr Harrison  The hospital will contact you with a preoperative appointment to discuss Anesthesia.  The phone number is [phone]  Please bring your medications with you for the appointment. They will tell you the arrival time and medication instructions when you have your preoperative evaluation. Do not wear nail polish the day of your surgery and if you take Phentermine you need to stop this medication ONE WEEK prior to your surgery.   Meniscus Injury, Arthroscopy   Arthroscopy is a surgical procedure that involves the use of a small scope that has a camera and surgical instruments on the end (arthroscope). An arthroscope can be used to repair your meniscus injury.  LET YOUR HEALTH CARE PROVIDER KNOW ABOUT: Any allergies you have. All medicines you are taking, including vitamins, herbs, eyedrops, creams, and over-the-counter medicines. Any recent colds or infections you have had or currently have. Previous problems you or members of your family have had with the use of anesthetics. Any blood disorders or blood clotting problems you have. Previous surgeries you have had. Medical conditions you have. RISKS AND COMPLICATIONS Generally, this is a safe procedure. However, as with any procedure, problems can occur. Possible problems include: Damage to nerves or blood vessels. Excess bleeding. Blood clots. Infection. BEFORE THE PROCEDURE Do not eat or drink for 6-8 hours before the procedure. Take medicines as directed by your surgeon. Ask your surgeon about changing or stopping your regular medicines. You may have lab tests the morning of surgery. PROCEDURE  You will be given one of the following:  A medicine that numbs the area (local anesthesia). A medicine that makes you go to sleep (general anesthesia). A medicine injected into your spine that numbs your body below the waist (spinal anesthesia). Most often, several small cuts (incisions) are  made in the knee. The arthroscope and instruments go into the incisions to repair the damage. The torn portion of the meniscus is removed.   AFTER THE PROCEDURE You will be taken to the recovery area where your progress will be monitored. When you are awake, stable, and taking fluids without complications, you will be allowed to go home. This is usually the same day. A torn or stretched ligament (ligament sprain) may take 6-8 weeks to heal.  It takes about the 4-6 WEEKS if your surgeon removed a torn meniscus. A repaired meniscus may require 6-12 weeks of recovery time. A torn ligament needing reconstructive surgery may take 6-12 months to heal fully.   This information is not intended to replace advice given to you by your health care provider. Make sure you discuss any questions you have with your health care provider. You have decided to proceed with operative arthroscopy of the knee. You have decided not to continue with nonoperative measures such as but not limited to oral medication, weight loss, activity modification, physical therapy, bracing, or injection.  We will perform operative arthroscopy of the knee. Some of the risks associated with arthroscopic surgery of the knee include but are not limited to Bleeding Infection Swelling Stiffness Blood clot Pain Need for knee replacement surgery    In compliance with recent [place] law in federal regulation regarding opioid use and abuse and addiction, we will taper (stop) opioid medication after 2 weeks.  If you're not comfortable with these risks and would like to continue with nonoperative treatment please let Dr. Harrison know prior to your surgery.  

## 2021-09-28 ENCOUNTER — Telehealth: Payer: Self-pay | Admitting: *Deleted

## 2021-09-28 NOTE — Telephone Encounter (Signed)
PA Case: [id number], Status: Approved, Coverage Starts on: 09/23/2021 12:00:00 AM, Coverage Ends on: 09/22/2022 12:00:00 AM. Questions? Contact [phone].

## 2021-10-02 NOTE — Patient Instructions (Signed)
Roy [name]  10/02/2021     @PREFPERIOPPHARMACY @   Your procedure is scheduled on  10/09/2021.   Report to [facility] at  1130  A.M.   Call this number if you have problems the morning of surgery:  [phone]   Remember:  Do not eat or drink after midnight.      Take these medicines the morning of surgery with A SIP OF WATER    xanax (if needed), carvedilol, dexilant, cymbalta, levothyroxine.     Do not wear jewelry, make-up or nail polish.  Do not wear lotions, powders, or perfumes, or deodorant.  Do not shave 48 hours prior to surgery.  Men may shave face and neck.  Do not bring valuables to the hospital.  [facility] is not responsible for any belongings or valuables.  Contacts, dentures or bridgework may not be worn into surgery.  Leave your suitcase in the car.  After surgery it may be brought to your room.  For patients admitted to the hospital, discharge time will be determined by your treatment team.  Patients discharged the day of surgery will not be allowed to drive home and must have someone with them for 24 hours.    Special instructions:   DO NOT smoke tobacco or vape for 24 hours before your procedure.  Please read over the following fact sheets that you were given. Coughing and Deep Breathing, Surgical Site Infection Prevention, Anesthesia Post-op Instructions, and Care and Recovery After Surgery      Arthroscopic Knee Ligament Repair, Care After This sheet gives you information about how to care for yourself after your procedure. Your health care provider may also give you more specific instructions. If you have problems or questions, contact your health care provider. What can I expect after the procedure? After the procedure, it is common to have: Soreness or pain in your knee. Bruising and swelling on your knee, calf, and ankle for 3-4 days. A small amount of fluid coming from the incisions. Follow these instructions at  home: Medicines Take over-the-counter and prescription medicines only as told by your health care provider. Ask your health care provider if the medicine prescribed to you: Requires you to avoid driving or using machinery. Can cause constipation. You may need to take these actions to prevent or treat constipation: Drink enough fluid to keep your urine pale yellow. Take over-the-counter or prescription medicines. Eat foods that are high in fiber, such as beans, whole grains, and fresh fruits and vegetables. Limit foods that are high in fat and processed sugars, such as fried or sweet foods. If you have a brace or immobilizer: Wear it as told by your health care provider. Remove it only as told by your health care provider. Loosen it if your toes tingle, become numb, or turn cold and blue. Keep it clean and dry. Ask your health care provider when it is safe to drive. Bathing Do not take baths, swim, or use a hot tub until your health care provider approves. Keep your bandage (dressing) dry until your health care provider says that it can be removed. If the brace or immobilizer is not waterproof: Do not let it get wet. Cover it with a watertight covering when you take a bath or shower. Incision care  Follow instructions from your health care provider about how to take care of your incisions. Make sure you: Wash your hands with soap and water for at least 20  seconds before and after you change your dressing. If soap and water are not available, use hand sanitizer. Change your dressing as told by your health care provider. Leave stitches (sutures), skin glue, or adhesive strips in place. These skin closures may need to stay in place for 2 weeks or longer. If adhesive strip edges start to loosen and curl up, you may trim the loose edges. Do not remove adhesive strips completely unless your health care provider tells you to do that. Check your incision areas every day for signs of infection.  Check for: Redness. More swelling or pain. Blood or more fluid. Warmth. Pus or a bad smell. Managing pain, stiffness, and swelling  If directed, put ice on the affected area. To do this: If you have a removable brace or immobilizer, remove it as told by your health care provider. Put ice in a plastic bag. Place a towel between your skin and the bag. Leave the ice on for 20 minutes, 2-3 times a day. Remove the ice if your skin turns bright red. This is very important. If you cannot feel pain, heat, or cold, you have a greater risk of damage to the area. Move your toes often to reduce stiffness and swelling. Raise (elevate) the injured area above the level of your heart while you are sitting or lying down. Activity Do not use your knee to support your body weight until your health care provider says that you can. Use crutches or other devices as told by your health care provider. Do physical therapy exercises as told by your health care provider. Physical therapy will help you regain movement and strength in your knee. Follow instructions from your health care provider about: When you may start motion exercises. When you may start riding a stationary bike and doing other low-impact activities. When you may start to jog and do other high-impact activities. Do not lift anything that is heavier than 10 lb (4.5 kg), or the limit that you are told, until your health care provider says that it is safe. Ask your health care provider what activities are safe for you. General instructions Do not use any products that contain nicotine or tobacco, such as cigarettes, e-cigarettes, and chewing tobacco. These can delay healing. If you need help quitting, ask your health care provider. Wear compression stockings as told by your health care provider. These stockings help to prevent blood clots and reduce swelling in your legs. Keep all follow-up visits. This is important. Contact a health care provider  if: You have any of these signs of infection: Redness around an incision. Blood or more fluid coming from an incision. Warmth coming from an incision. Pus or a bad smell coming from an incision. More swelling or pain in your knee. A fever or chills. You have pain that does not get better with medicine. Your incision opens up. Get help right away if: You have trouble breathing. You have chest pain. You have increased pain or swelling in your calf or at the back of your knee. You have numbness and tingling near the knee joint or in the foot, ankle, or toes. You notice that your foot or toes look darker than normal or are cooler than normal. These symptoms may represent a serious problem that is an emergency. Do not wait to see if the symptoms will go away. Get medical help right away. Call your local emergency services (911 in the [place] Do not drive yourself to the hospital. Summary After the procedure,  it is common to have knee pain with bruising and swelling on your knee, calf, and ankle. Icing your knee and raising your leg above the level of your heart will help control the pain and swelling. Do physical therapy exercises as told by your health care provider. Physical therapy will help you regain movement and strength in your knee. This information is not intended to replace advice given to you by your health care provider. Make sure you discuss any questions you have with your health care provider. Document Revised: 02/07/2020 Document Reviewed: 02/07/2020 Elsevier Patient Education  2022 [employer] General Anesthesia, Adult, Care After This sheet gives you information about how to care for yourself after your procedure. Your health care provider may also give you more specific instructions. If you have problems or questions, contact your health care provider. What can I expect after the procedure? After the procedure, the following side effects are common: Pain or discomfort at  the IV site. Nausea. Vomiting. Sore throat. Trouble concentrating. Feeling cold or chills. Feeling weak or tired. Sleepiness and fatigue. Soreness and body aches. These side effects can affect parts of the body that were not involved in surgery. Follow these instructions at home: For the time period you were told by your health care provider:  Rest. Do not participate in activities where you could fall or become injured. Do not drive or use machinery. Do not drink alcohol. Do not take sleeping pills or medicines that cause drowsiness. Do not make important decisions or sign legal documents. Do not take care of children on your own. Eating and drinking Follow any instructions from your health care provider about eating or drinking restrictions. When you feel hungry, start by eating small amounts of foods that are soft and easy to digest (bland), such as toast. Gradually return to your regular diet. Drink enough fluid to keep your urine pale yellow. If you vomit, rehydrate by drinking water, juice, or clear broth. General instructions If you have sleep apnea, surgery and certain medicines can increase your risk for breathing problems. Follow instructions from your health care provider about wearing your sleep device: Anytime you are sleeping, including during daytime naps. While taking prescription pain medicines, sleeping medicines, or medicines that make you drowsy. Have a responsible adult stay with you for the time you are told. It is important to have someone help care for you until you are awake and alert. Return to your normal activities as told by your health care provider. Ask your health care provider what activities are safe for you. Take over-the-counter and prescription medicines only as told by your health care provider. If you smoke, do not smoke without supervision. Keep all follow-up visits as told by your health care provider. This is important. Contact a health care  provider if: You have nausea or vomiting that does not get better with medicine. You cannot eat or drink without vomiting. You have pain that does not get better with medicine. You are unable to pass urine. You develop a skin rash. You have a fever. You have redness around your IV site that gets worse. Get help right away if: You have difficulty breathing. You have chest pain. You have blood in your urine or stool, or you vomit blood. Summary After the procedure, it is common to have a sore throat or nausea. It is also common to feel tired. Have a responsible adult stay with you for the time you are told. It is important to have someone  help care for you until you are awake and alert. When you feel hungry, start by eating small amounts of foods that are soft and easy to digest (bland), such as toast. Gradually return to your regular diet. Drink enough fluid to keep your urine pale yellow. Return to your normal activities as told by your health care provider. Ask your health care provider what activities are safe for you. This information is not intended to replace advice given to you by your health care provider. Make sure you discuss any questions you have with your health care provider. Document Revised: 05/25/2020 Document Reviewed: 12/23/2019 Elsevier Patient Education  2022 Else[employer] Inc. How to Use Chlorhexidine for Bathing Chlorhexidine gluconate (CHG) is a germ-killing (antiseptic) solution that is used to clean the skin. It can get rid of the bacteria that normally live on the skin and can keep them away for about 24 hours. To clean your skin with CHG, you may be given: A CHG solution to use in the shower or as part of a sponge bath. A prepackaged cloth that contains CHG. Cleaning your skin with CHG may help lower the risk for infection: While you are staying in the intensive care unit of the hospital. If you have a vascular access, such as a central line, to provide short-term or  long-term access to your veins. If you have a catheter to drain urine from your bladder. If you are on a ventilator. A ventilator is a machine that helps you breathe by moving air in and out of your lungs. After surgery. What are the risks? Risks of using CHG include: A skin reaction. Hearing loss, if CHG gets in your ears and you have a perforated eardrum. Eye injury, if CHG gets in your eyes and is not rinsed out. The CHG product catching fire. Make sure that you avoid smoking and flames after applying CHG to your skin. Do not use CHG: If you have a chlorhexidine allergy or have previously reacted to chlorhexidine. On babies younger than 52 months of age. How to use CHG solution Use CHG only as told by your health care provider, and follow the instructions on the label. Use the full amount of CHG as directed. Usually, this is one bottle. During a shower Follow these steps when using CHG solution during a shower (unless your health care provider gives you different instructions): Start the shower. Use your normal soap and shampoo to wash your face and hair. Turn off the shower or move out of the shower stream. Pour the CHG onto a clean washcloth. Do not use any type of brush or rough-edged sponge. Starting at your neck, lather your body down to your toes. Make sure you follow these instructions: If you will be having surgery, pay special attention to the part of your body where you will be having surgery. Scrub this area for at least 1 minute. Do not use CHG on your head or face. If the solution gets into your ears or eyes, rinse them well with water. Avoid your genital area. Avoid any areas of skin that have broken skin, cuts, or scrapes. Scrub your back and under your arms. Make sure to wash skin folds. Let the lather sit on your skin for 1-2 minutes or as long as told by your health care provider. Thoroughly rinse your entire body in the shower. Make sure that all body creases and  crevices are rinsed well. Dry off with a clean towel. Do not put any substances on  your body afterward--such as powder, lotion, or perfume--unless you are told to do so by your health care provider. Only use lotions that are recommended by the manufacturer. Put on clean clothes or pajamas. If it is the night before your surgery, sleep in clean sheets.  During a sponge bath Follow these steps when using CHG solution during a sponge bath (unless your health care provider gives you different instructions): Use your normal soap and shampoo to wash your face and hair. Pour the CHG onto a clean washcloth. Starting at your neck, lather your body down to your toes. Make sure you follow these instructions: If you will be having surgery, pay special attention to the part of your body where you will be having surgery. Scrub this area for at least 1 minute. Do not use CHG on your head or face. If the solution gets into your ears or eyes, rinse them well with water. Avoid your genital area. Avoid any areas of skin that have broken skin, cuts, or scrapes. Scrub your back and under your arms. Make sure to wash skin folds. Let the lather sit on your skin for 1-2 minutes or as long as told by your health care provider. Using a different clean, wet washcloth, thoroughly rinse your entire body. Make sure that all body creases and crevices are rinsed well. Dry off with a clean towel. Do not put any substances on your body afterward--such as powder, lotion, or perfume--unless you are told to do so by your health care provider. Only use lotions that are recommended by the manufacturer. Put on clean clothes or pajamas. If it is the night before your surgery, sleep in clean sheets. How to use CHG prepackaged cloths Only use CHG cloths as told by your health care provider, and follow the instructions on the label. Use the CHG cloth on clean, dry skin. Do not use the CHG cloth on your head or face unless your health  care provider tells you to. When washing with the CHG cloth: Avoid your genital area. Avoid any areas of skin that have broken skin, cuts, or scrapes. Before surgery Follow these steps when using a CHG cloth to clean before surgery (unless your health care provider gives you different instructions): Using the CHG cloth, vigorously scrub the part of your body where you will be having surgery. Scrub using a back-and-forth motion for 3 minutes. The area on your body should be completely wet with CHG when you are done scrubbing. Do not rinse. Discard the cloth and let the area air-dry. Do not put any substances on the area afterward, such as powder, lotion, or perfume. Put on clean clothes or pajamas. If it is the night before your surgery, sleep in clean sheets.  For general bathing Follow these steps when using CHG cloths for general bathing (unless your health care provider gives you different instructions). Use a separate CHG cloth for each area of your body. Make sure you wash between any folds of skin and between your fingers and toes. Wash your body in the following order, switching to a new cloth after each step: The front of your neck, shoulders, and chest. Both of your arms, under your arms, and your hands. Your stomach and groin area, avoiding the genitals. Your right leg and foot. Your left leg and foot. The back of your neck, your back, and your buttocks. Do not rinse. Discard the cloth and let the area air-dry. Do not put any substances on your body afterward--such  as powder, lotion, or perfume--unless you are told to do so by your health care provider. Only use lotions that are recommended by the manufacturer. Put on clean clothes or pajamas. Contact a health care provider if: Your skin gets irritated after scrubbing. You have questions about using your solution or cloth. You swallow any chlorhexidine. Call your local poison control center ([phone] in the [place] Get help  right away if: Your eyes itch badly, or they become very red or swollen. Your skin itches badly and is red or swollen. Your hearing changes. You have trouble seeing. You have swelling or tingling in your mouth or throat. You have trouble breathing. These symptoms may represent a serious problem that is an emergency. Do not wait to see if the symptoms will go away. Get medical help right away. Call your local emergency services (911 in the [place] Do not drive yourself to the hospital. Summary Chlorhexidine gluconate (CHG) is a germ-killing (antiseptic) solution that is used to clean the skin. Cleaning your skin with CHG may help to lower your risk for infection. You may be given CHG to use for bathing. It may be in a bottle or in a prepackaged cloth to use on your skin. Carefully follow your health care provider's instructions and the instructions on the product label. Do not use CHG if you have a chlorhexidine allergy. Contact your health care provider if your skin gets irritated after scrubbing. This information is not intended to replace advice given to you by your health care provider. Make sure you discuss any questions you have with your health care provider. Document Revised: 11/20/2020 Document Reviewed: 11/20/2020 Elsevier Patient Education  2022 [employer]

## 2021-10-04 ENCOUNTER — Encounter (HOSPITAL_COMMUNITY): Payer: Self-pay

## 2021-10-04 ENCOUNTER — Encounter (HOSPITAL_COMMUNITY)
Admission: RE | Admit: 2021-10-04 | Discharge: 2021-10-04 | Disposition: A | Discharge status: Home / Self Care | Payer: Medicare HMO | Source: Ambulatory Visit | Attending: Orthopedic Surgery | Admitting: Orthopedic Surgery

## 2021-10-04 ENCOUNTER — Other Ambulatory Visit: Payer: Self-pay

## 2021-10-04 ENCOUNTER — Ambulatory Visit: Payer: Medicare HMO | Admitting: Orthopedic Surgery

## 2021-10-04 DIAGNOSIS — Z01812 Encounter for preprocedural laboratory examination: Secondary | ICD-10-CM | POA: Insufficient documentation

## 2021-10-04 DIAGNOSIS — S83282D Other tear of lateral meniscus, current injury, left knee, subsequent encounter: Secondary | ICD-10-CM

## 2021-10-04 DIAGNOSIS — S83242D Other tear of medial meniscus, current injury, left knee, subsequent encounter: Secondary | ICD-10-CM

## 2021-10-04 HISTORY — DX: Hypothyroidism, unspecified: E03.9

## 2021-10-04 LAB — CBC WITH DIFFERENTIAL/PLATELET
Abs Immature Granulocytes: 0.03 10*3/uL (ref 0.00–0.07)
Basophils Absolute: 0 10*3/uL (ref 0.0–0.1)
Basophils Relative: 1 %
Eosinophils Absolute: 0.2 10*3/uL (ref 0.0–0.5)
Eosinophils Relative: 3 %
HCT: 40.3 % (ref 39.0–52.0)
Hemoglobin: 13.7 g/dL (ref 13.0–17.0)
Immature Granulocytes: 1 %
Lymphocytes Relative: 28 %
Lymphs Abs: 1.7 10*3/uL (ref 0.7–4.0)
MCH: 32 pg (ref 26.0–34.0)
MCHC: 34 g/dL (ref 30.0–36.0)
MCV: 94.2 fL (ref 80.0–100.0)
Monocytes Absolute: 0.3 10*3/uL (ref 0.1–1.0)
Monocytes Relative: 5 %
Neutro Abs: 3.8 10*3/uL (ref 1.7–7.7)
Neutrophils Relative %: 62 %
Platelets: 217 10*3/uL (ref 150–400)
RBC: 4.28 MIL/uL (ref 4.22–5.81)
RDW: 13.5 % (ref 11.5–15.5)
WBC: 6 10*3/uL (ref 4.0–10.5)
nRBC: 0 % (ref 0.0–0.2)

## 2021-10-04 LAB — BASIC METABOLIC PANEL
Anion gap: 10 (ref 5–15)
BUN: 11 mg/dL (ref 6–20)
CO2: 23 mmol/L (ref 22–32)
Calcium: 9.3 mg/dL (ref 8.9–10.3)
Chloride: 105 mmol/L (ref 98–111)
Creatinine, Ser: 0.68 mg/dL (ref 0.61–1.24)
GFR, Estimated: >60 mL/min (ref >60)
Glucose, Bld: 148 mg/dL — ABNORMAL HIGH (ref 70–99)
Potassium: 3.8 mmol/L (ref 3.5–5.1)
Sodium: 138 mmol/L (ref 135–145)

## 2021-10-08 NOTE — H&P (Signed)
Chief Complaint  Patient presents with   Knee Pain      Lt knee pain for       HPI: 51 year old male pain in his left knee for 6 to 8 months associated with medial sided pain popping with every step giving way especially on steps difficulty walking such as going on shopping trips.  Presents from Dr. Darlyn Read for consultation  HE EVENTUALLY HAD MRI which shows a tear with OA        Past Medical History:  Diagnosis Date   Anxiety     Asthma     Depression     High cholesterol     Hypertension     Low testosterone     Median nerve dysfunction     Radiculopathy of cervical region     Thyroid disease     Ulnar neuropathy at elbow     Weakness            Past Medical History:  Diagnosis Date   Anxiety     Asthma     Depression     High cholesterol     Hypertension     Low testosterone     Median nerve dysfunction     Radiculopathy of cervical region     Thyroid disease     Ulnar neuropathy at elbow     Weakness        Social History         Tobacco Use   Smoking status: Every Day      Packs/day: 0.50      Years: 30.00      Pack years: 15.00      Types: Cigarettes      Last attempt to quit: 07/23/2017      Years since quitting: 4.1   Smokeless tobacco: Former      Types: Snuff, Chew  Vaping Use   Vaping Use: Never used  Substance Use Topics   Alcohol use: No   Drug use: No            Meds ordered this encounter  Medications   meloxicam (MOBIC) 7.5 MG tablet      Sig: Take 1 tablet (7.5 mg total) by mouth daily.      Dispense:  30 tablet      Refill:  5        Ht 5\' 8"  (1.727 m)    Wt (!) 353 lb (160.1 kg)    BMI 53.67 kg/m      General appearance: Well-developed well-nourished no gross deformities  Cardiovascular normal pulse and perfusion normal color without edema  Neurologically no sensation loss or deficits or pathologic reflexes   Psychological: Awake alert and oriented x3 mood and affect normal   Skin no lacerations or ulcerations no  nodularity no palpable masses, no erythema or nodularity   Musculoskeletal: Cannot detect whether the patient has effusion or not because of the size of the leg he does have tenderness over the medial joint line he flexes the knee to 100 degrees it seems to come to full extension it otherwise feels stable.  He does have positive meniscal signs from McMurray's and the hyperextension test   Imaging x-rays show medial compartment narrowing compared to lateral  MRI    Report findings: IMPRESSION:  1. Medial meniscus tear.  2. Small radial tear lateral meniscus.  3. Patchy full-thickness cartilage loss in the medial and patellofemoral compartments.  4. Likely stress-related bone marrow edema in the  medial tibial plateau, greater than femoral condyle.  5. Small lateral femoral condyle cartilage defect, with subchondral bone marrow edema.  6. Moderate joint effusion and Baker's cyst.       I discussed this with him at length arthroscopy versus knee replacement are the surgical options however his BMI is 41 and his age is 25.  So his knee replacement options are minimal at this point  A/P:  OA MEDIAL MENISCUS TEAR  3.   LATERAL MENISCUS TEAR   Recommend arthroscopic surgery left knee with medial and lateral meniscectomies + debridements 8 weeks of protected weightbearing and I told him we could probably get him 60% better but then he did need ongoing treatment  He is agreeable to this and we will set up the surgery for arthroscopy left knee and partial medial and lateral meniscectomies

## 2021-10-09 ENCOUNTER — Encounter (HOSPITAL_COMMUNITY): Payer: Self-pay | Admitting: Orthopedic Surgery

## 2021-10-09 ENCOUNTER — Other Ambulatory Visit: Payer: Self-pay

## 2021-10-09 ENCOUNTER — Encounter (HOSPITAL_COMMUNITY)
Admission: RE | Disposition: A | Discharge status: Home / Self Care | Payer: Self-pay | Source: Home / Self Care | Attending: Orthopedic Surgery

## 2021-10-09 ENCOUNTER — Encounter: Payer: Self-pay | Admitting: Orthopedic Surgery

## 2021-10-09 ENCOUNTER — Ambulatory Visit (HOSPITAL_COMMUNITY)
Admission: RE | Admit: 2021-10-09 | Discharge: 2021-10-09 | Disposition: A | Discharge status: Home / Self Care | Payer: Medicare HMO | Attending: Orthopedic Surgery | Admitting: Orthopedic Surgery

## 2021-10-09 ENCOUNTER — Ambulatory Visit (HOSPITAL_COMMUNITY): Payer: Medicare HMO | Admitting: Anesthesiology

## 2021-10-09 DIAGNOSIS — I10 Essential (primary) hypertension: Secondary | ICD-10-CM | POA: Insufficient documentation

## 2021-10-09 DIAGNOSIS — S83242A Other tear of medial meniscus, current injury, left knee, initial encounter: Secondary | ICD-10-CM

## 2021-10-09 DIAGNOSIS — S83282A Other tear of lateral meniscus, current injury, left knee, initial encounter: Secondary | ICD-10-CM | POA: Insufficient documentation

## 2021-10-09 DIAGNOSIS — S83242D Other tear of medial meniscus, current injury, left knee, subsequent encounter: Secondary | ICD-10-CM | POA: Diagnosis not present

## 2021-10-09 DIAGNOSIS — M23301 Other meniscus derangements, unspecified lateral meniscus, left knee: Secondary | ICD-10-CM

## 2021-10-09 DIAGNOSIS — M1712 Unilateral primary osteoarthritis, left knee: Secondary | ICD-10-CM

## 2021-10-09 DIAGNOSIS — E039 Hypothyroidism, unspecified: Secondary | ICD-10-CM | POA: Diagnosis not present

## 2021-10-09 DIAGNOSIS — M659 Synovitis and tenosynovitis, unspecified: Secondary | ICD-10-CM | POA: Insufficient documentation

## 2021-10-09 DIAGNOSIS — M94262 Chondromalacia, left knee: Secondary | ICD-10-CM | POA: Diagnosis not present

## 2021-10-09 DIAGNOSIS — X58XXXA Exposure to other specified factors, initial encounter: Secondary | ICD-10-CM | POA: Insufficient documentation

## 2021-10-09 DIAGNOSIS — S83232A Complex tear of medial meniscus, current injury, left knee, initial encounter: Secondary | ICD-10-CM | POA: Diagnosis not present

## 2021-10-09 DIAGNOSIS — F17210 Nicotine dependence, cigarettes, uncomplicated: Secondary | ICD-10-CM | POA: Diagnosis not present

## 2021-10-09 HISTORY — PX: KNEE ARTHROSCOPY WITH LATERAL MENISECTOMY: SHX6193

## 2021-10-09 SURGERY — ARTHROSCOPY, KNEE, WITH LATERAL MENISCECTOMY
Anesthesia: General | Site: Knee | Laterality: Left

## 2021-10-09 MED ORDER — SUCCINYLCHOLINE CHLORIDE 200 MG/10ML IV SOSY
PREFILLED_SYRINGE | INTRAVENOUS | Status: DC | PRN
Start: 2021-10-09 — End: 2021-10-09
  Administered 2021-10-09: 120 mg via INTRAVENOUS

## 2021-10-09 MED ORDER — PHENYLEPHRINE 40 MCG/ML (10ML) SYRINGE FOR IV PUSH (FOR BLOOD PRESSURE SUPPORT)
PREFILLED_SYRINGE | INTRAVENOUS | Status: AC
  Filled 2021-10-09: qty 10

## 2021-10-09 MED ORDER — ONDANSETRON HCL 4 MG/2ML IJ SOLN
4.0000 mg | Freq: Once | INTRAMUSCULAR | Status: AC
  Administered 2021-10-09: 4 mg via INTRAVENOUS

## 2021-10-09 MED ORDER — PROPOFOL 10 MG/ML IV BOLUS
INTRAVENOUS | Status: AC
  Filled 2021-10-09: qty 20

## 2021-10-09 MED ORDER — ONDANSETRON HCL 4 MG/2ML IJ SOLN
INTRAMUSCULAR | Status: DC | PRN
  Administered 2021-10-09: 4 mg via INTRAVENOUS

## 2021-10-09 MED ORDER — MIDAZOLAM HCL 2 MG/2ML IJ SOLN
INTRAMUSCULAR | Status: AC
  Filled 2021-10-09: qty 2

## 2021-10-09 MED ORDER — SODIUM CHLORIDE 0.9 % IR SOLN
Status: DC | PRN
  Administered 2021-10-09 (×3): 3000 mL

## 2021-10-09 MED ORDER — ORAL CARE MOUTH RINSE: 15.0000 mL | Freq: Once | OROMUCOSAL | Status: AC

## 2021-10-09 MED ORDER — EPHEDRINE SULFATE-NACL 50-0.9 MG/10ML-% IV SOSY
PREFILLED_SYRINGE | INTRAVENOUS | Status: DC | PRN
Start: 2021-10-09 — End: 2021-10-09
  Administered 2021-10-09: 5 mg via INTRAVENOUS
  Administered 2021-10-09: 10 mg via INTRAVENOUS

## 2021-10-09 MED ORDER — SUCCINYLCHOLINE CHLORIDE 200 MG/10ML IV SOSY
PREFILLED_SYRINGE | INTRAVENOUS | Status: AC
  Filled 2021-10-09: qty 10

## 2021-10-09 MED ORDER — FENTANYL CITRATE (PF) 100 MCG/2ML IJ SOLN
INTRAMUSCULAR | Status: AC
  Filled 2021-10-09: qty 2

## 2021-10-09 MED ORDER — LIDOCAINE HCL (PF) 2 % IJ SOLN
INTRAMUSCULAR | Status: AC
  Filled 2021-10-09: qty 5

## 2021-10-09 MED ORDER — VANCOMYCIN HCL IN DEXTROSE 1-5 GM/200ML-% IV SOLN
1000.0000 mg | INTRAVENOUS | Status: AC
  Administered 2021-10-09: 1000 mg via INTRAVENOUS

## 2021-10-09 MED ORDER — ONDANSETRON HCL 4 MG/2ML IJ SOLN
INTRAMUSCULAR | Status: AC
  Filled 2021-10-09: qty 2

## 2021-10-09 MED ORDER — LACTATED RINGERS IV SOLN: INTRAVENOUS | Status: DC

## 2021-10-09 MED ORDER — ROCURONIUM BROMIDE 10 MG/ML (PF) SYRINGE
PREFILLED_SYRINGE | INTRAVENOUS | Status: DC | PRN
Start: 2021-10-09 — End: 2021-10-09
  Administered 2021-10-09: 20 mg via INTRAVENOUS
  Administered 2021-10-09: 40 mg via INTRAVENOUS

## 2021-10-09 MED ORDER — ROCURONIUM BROMIDE 10 MG/ML (PF) SYRINGE
PREFILLED_SYRINGE | INTRAVENOUS | Status: AC
  Filled 2021-10-09: qty 10

## 2021-10-09 MED ORDER — CHLORHEXIDINE GLUCONATE 0.12 % MT SOLN
15.0000 mL | Freq: Once | OROMUCOSAL | Status: AC
  Administered 2021-10-09: 15 mL via OROMUCOSAL

## 2021-10-09 MED ORDER — VANCOMYCIN HCL IN DEXTROSE 1-5 GM/200ML-% IV SOLN
INTRAVENOUS | Status: AC
  Filled 2021-10-09: qty 200

## 2021-10-09 MED ORDER — IBUPROFEN 800 MG PO TABS
800.0000 mg | ORAL_TABLET | Freq: Once | ORAL | Status: AC
  Administered 2021-10-09: 800 mg via ORAL

## 2021-10-09 MED ORDER — EPINEPHRINE PF 1 MG/ML IJ SOLN
INTRAMUSCULAR | Status: AC
  Filled 2021-10-09: qty 6

## 2021-10-09 MED ORDER — HYDROCODONE-ACETAMINOPHEN 5-325 MG PO TABS
2.0000 | ORAL_TABLET | Freq: Once | ORAL | Status: AC
  Administered 2021-10-09: 2 via ORAL

## 2021-10-09 MED ORDER — VANCOMYCIN HCL IN DEXTROSE 1-5 GM/200ML-% IV SOLN: 1000.0000 mg | INTRAVENOUS | Status: DC

## 2021-10-09 MED ORDER — IBUPROFEN 800 MG PO TABS
ORAL_TABLET | ORAL | Status: AC
  Filled 2021-10-09: qty 1

## 2021-10-09 MED ORDER — LIDOCAINE 2% (20 MG/ML) 5 ML SYRINGE
INTRAMUSCULAR | Status: DC | PRN
  Administered 2021-10-09: 100 mg via INTRAVENOUS

## 2021-10-09 MED ORDER — HYDROCODONE-ACETAMINOPHEN 5-325 MG PO TABS
ORAL_TABLET | ORAL | Status: AC
  Filled 2021-10-09: qty 2

## 2021-10-09 MED ORDER — KETOROLAC TROMETHAMINE 30 MG/ML IJ SOLN
INTRAMUSCULAR | Status: DC | PRN
  Administered 2021-10-09: 30 mg via INTRAVENOUS

## 2021-10-09 MED ORDER — MIDAZOLAM HCL 5 MG/5ML IJ SOLN
INTRAMUSCULAR | Status: DC | PRN
  Administered 2021-10-09: 2 mg via INTRAVENOUS

## 2021-10-09 MED ORDER — BUPIVACAINE-EPINEPHRINE (PF) 0.5% -1:200000 IJ SOLN
INTRAMUSCULAR | Status: AC
  Filled 2021-10-09: qty 30

## 2021-10-09 MED ORDER — ONDANSETRON HCL 4 MG/2ML IJ SOLN
4.0000 mg | Freq: Once | INTRAMUSCULAR | Status: DC | PRN
  Filled 2021-10-09: qty 2

## 2021-10-09 MED ORDER — BUPIVACAINE-EPINEPHRINE (PF) 0.5% -1:200000 IJ SOLN
INTRAMUSCULAR | Status: DC | PRN
  Administered 2021-10-09: 30 mL

## 2021-10-09 MED ORDER — HYDROCODONE-ACETAMINOPHEN 10-325 MG PO TABS: 1.0000 | ORAL_TABLET | ORAL | 0 refills | Status: DC | PRN

## 2021-10-09 MED ORDER — FENTANYL CITRATE PF 50 MCG/ML IJ SOSY: 25.0000 ug | PREFILLED_SYRINGE | INTRAMUSCULAR | Status: DC | PRN

## 2021-10-09 MED ORDER — FENTANYL CITRATE (PF) 100 MCG/2ML IJ SOLN
INTRAMUSCULAR | Status: DC | PRN
  Administered 2021-10-09 (×3): 50 ug via INTRAVENOUS

## 2021-10-09 MED ORDER — PROPOFOL 10 MG/ML IV BOLUS
INTRAVENOUS | Status: DC | PRN
  Administered 2021-10-09: 30 mg via INTRAVENOUS
  Administered 2021-10-09: 170 mg via INTRAVENOUS

## 2021-10-09 MED ORDER — PHENYLEPHRINE 40 MCG/ML (10ML) SYRINGE FOR IV PUSH (FOR BLOOD PRESSURE SUPPORT)
PREFILLED_SYRINGE | INTRAVENOUS | Status: DC | PRN
Start: 2021-10-09 — End: 2021-10-09
  Administered 2021-10-09: 80 ug via INTRAVENOUS

## 2021-10-09 MED ORDER — SUGAMMADEX SODIUM 200 MG/2ML IV SOLN
INTRAVENOUS | Status: DC | PRN
Start: 2021-10-09 — End: 2021-10-09
  Administered 2021-10-09: 200 mg via INTRAVENOUS

## 2021-10-09 SURGICAL SUPPLY — 43 items
ABLATOR ASPIRATE 50D MULTI-PRT (SURGICAL WAND) ×1 IMPLANT
APL PRP STRL LF DISP 70% ISPRP (MISCELLANEOUS) ×1
BLADE SURG SZ11 CARB STEEL (BLADE) ×2 IMPLANT
BNDG CMPR STD VLCR NS LF 5.8X6 (GAUZE/BANDAGES/DRESSINGS) ×1
BNDG ELASTIC 6X5.8 VLCR NS LF (GAUZE/BANDAGES/DRESSINGS) ×2 IMPLANT
CHLORAPREP W/TINT 26 (MISCELLANEOUS) ×3 IMPLANT
CLOTH BEACON ORANGE TIMEOUT ST (SAFETY) ×2 IMPLANT
COOLER ICEMAN CLASSIC (MISCELLANEOUS) ×2 IMPLANT
DRAPE HALF SHEET 40X57 (DRAPES) ×2 IMPLANT
GAUZE 4X4 16PLY ~~LOC~~+RFID DBL (SPONGE) ×2 IMPLANT
GAUZE SPONGE 4X4 12PLY STRL (GAUZE/BANDAGES/DRESSINGS) ×2 IMPLANT
GAUZE XEROFORM 5X9 LF (GAUZE/BANDAGES/DRESSINGS) ×2 IMPLANT
GLOVE SS N UNI LF 8.5 STRL (GLOVE) ×2 IMPLANT
GLOVE SURG POLYISO LF SZ8 (GLOVE) ×2 IMPLANT
GLOVE SURG UNDER POLY LF SZ7 (GLOVE) ×2 IMPLANT
GOWN STRL REUS W/TWL LRG LVL3 (GOWN DISPOSABLE) ×2 IMPLANT
GOWN STRL REUS W/TWL XL LVL3 (GOWN DISPOSABLE) ×2 IMPLANT
IV NS IRRIG 3000ML ARTHROMATIC (IV SOLUTION) ×5 IMPLANT
KIT BLADEGUARD II DBL (SET/KITS/TRAYS/PACK) ×2 IMPLANT
KIT TURNOVER CYSTO (KITS) ×2 IMPLANT
MANIFOLD NEPTUNE II (INSTRUMENTS) ×2 IMPLANT
MARKER SKIN DUAL TIP RULER LAB (MISCELLANEOUS) ×2 IMPLANT
NDL HYPO 18GX1.5 BLUNT FILL (NEEDLE) ×1 IMPLANT
NDL HYPO 21X1.5 SAFETY (NEEDLE) ×1 IMPLANT
NDL SPNL 18GX3.5 QUINCKE PK (NEEDLE) ×1 IMPLANT
NEEDLE HYPO 18GX1.5 BLUNT FILL (NEEDLE) ×2 IMPLANT
NEEDLE HYPO 21X1.5 SAFETY (NEEDLE) ×2 IMPLANT
NEEDLE SPNL 18GX3.5 QUINCKE PK (NEEDLE) ×2 IMPLANT
PACK ARTHRO LIMB DRAPE STRL (MISCELLANEOUS) ×2 IMPLANT
PAD ABD 5X9 TENDERSORB (GAUZE/BANDAGES/DRESSINGS) ×2 IMPLANT
PAD ARMBOARD 7.5X6 YLW CONV (MISCELLANEOUS) ×2 IMPLANT
PAD COLD SHLDR WRAP-ON (PAD) ×1 IMPLANT
PAD FOR LEG HOLDER (MISCELLANEOUS) ×2 IMPLANT
PADDING CAST COTTON 6X4 STRL (CAST SUPPLIES) ×2 IMPLANT
RESECTOR TORPEDO 4MM 13CM CVD (MISCELLANEOUS) ×1 IMPLANT
SET ARTHROSCOPY INST (INSTRUMENTS) ×2 IMPLANT
SET BASIN LINEN APH (SET/KITS/TRAYS/PACK) ×2 IMPLANT
SPONGE GAUZE 4X4 12PLY (GAUZE/BANDAGES/DRESSINGS) ×1 IMPLANT
SUT ETHILON 3 0 FSL (SUTURE) ×2 IMPLANT
SYR 10ML LL (SYRINGE) ×1 IMPLANT
SYR 30ML LL (SYRINGE) ×2 IMPLANT
TUBE CONNECTING 12X1/4 (SUCTIONS) ×4 IMPLANT
TUBING IN/OUT FLOW W/MAIN PUMP (TUBING) ×2 IMPLANT

## 2021-10-09 NOTE — Anesthesia Preprocedure Evaluation (Addendum)
Anesthesia Evaluation  Patient identified by MRN, date of birth, ID band Patient awake    Reviewed: Allergy & Precautions, NPO status , Patient's Chart, lab work & pertinent test results  Airway Mallampati: II  TM Distance: >3 FB Neck ROM: Limited    Dental  (+) Dental Advisory Given, Poor Dentition, Chipped, Missing Poor repair:   Pulmonary asthma , Current Smoker and Patient abstained from smoking.,    Pulmonary exam normal        Cardiovascular hypertension, negative cardio ROS Normal cardiovascular exam     Neuro/Psych PSYCHIATRIC DISORDERS Anxiety Depression  Neuromuscular disease (radiculopathy)    GI/Hepatic Neg liver ROS, GERD  ,  Endo/Other  Hypothyroidism   Renal/GU negative Renal ROS     Musculoskeletal negative musculoskeletal ROS (+)   Abdominal   Peds  Hematology negative hematology ROS (+)   Anesthesia Other Findings   Reproductive/Obstetrics                            Anesthesia Physical Anesthesia Plan  ASA: 3  Anesthesia Plan: General   Post-op Pain Management:    Induction: Intravenous  PONV Risk Score and Plan:   Airway Management Planned: Oral ETT and LMA  Additional Equipment:   Intra-op Plan:   Post-operative Plan: Extubation in OR  Informed Consent: I have reviewed the patients History and Physical, chart, labs and discussed the procedure including the risks, benefits and alternatives for the proposed anesthesia with the patient or authorized representative who has indicated his/her understanding and acceptance.     Dental advisory given  Plan Discussed with: CRNA  Anesthesia Plan Comments:         Anesthesia Quick Evaluation

## 2021-10-09 NOTE — Transfer of Care (Signed)
Immediate Anesthesia Transfer of Care Note  Patient: Roy [name]  Procedure(s) Performed: KNEE ARTHROSCOPY WITH LATERAL AND MEDIAL MENISCECTOMY (Left: Knee)  Patient Location: PACU  Anesthesia Type:General  Level of Consciousness: awake, alert  and oriented  Airway & Oxygen Therapy: Patient Spontanous Breathing and Patient connected to face mask oxygen  Post-op Assessment: Report given to RN and Post -op Vital signs reviewed and stable  Post vital signs: Reviewed and stable  Last Vitals:  Vitals Value Taken Time  BP    Temp    Pulse    Resp    SpO2      Last Pain:  Vitals:   10/09/21 0928  PainSc: 6          Complications: No notable events documented.

## 2021-10-09 NOTE — Interval H&P Note (Signed)
History and Physical Interval Note:  10/09/2021 11:27 AM  Roy [name]  has presented today for surgery, with the diagnosis of left knee medial and lateral meniscus tears.  The various methods of treatment have been discussed with the patient and family. After consideration of risks, benefits and other options for treatment, the patient has consented to  Procedure(s): KNEE ARTHROSCOPY WITH LATERAL AND MEDIAL MENISCECTOMY (Left) as a surgical intervention.  The patient's history has been reviewed, patient examined, no change in status, stable for surgery.  I have reviewed the patient's chart and labs.  Questions were answered to the patient's satisfaction.     Arther Abbott

## 2021-10-09 NOTE — Op Note (Signed)
10/09/2021  12:54 PM  PATIENT:  Roy [name]  51 y.o. 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  Standard arthroscopic leg holder was placed around the left leg left leg was prepped and draped sterilely.  Left knee table because of potential size  After routine prep and drape of the left leg timeout was completed.  Images were up and reviewed.  Standard lateral portal was established the scope was placed into the joint followed by diagnostic arthroscopy.  Thorough tour of the joint starting in the suprapatellar pouch and continuing medially through the notch and then laterally  We then made a medial portal with a spinal needle and an 11 blade.  Next placed a curved torpedo shaver into the joint and treated the joint to gain better visualization.  There was a moderate amount of synovitis  We then used the same shaver to remove the torn fragments of meniscus.  This was followed by probing the meniscus.  Additional  shaving was performed and contouring of the remaining meniscus was done with a shaver and 50 degree ArthroCare wand  We then turned our attention to the lateral compartment where a small chondroplasty was done on the anterior portion of the lateral femoral condyle and tibial plateau as well as trimming of the free edge tear of lateral meniscus  Synovectomy was performed in the suprapatellar pouch to clear debris and synovium that looked hypertrophic  This was followed by thorough irrigation and injection of 30 cc of Marcaine with epinephrine  3-0 nylon suture conditions to close the portals.  The dressing included Xeroform 4 x 4's ABD cast padding and Ace bandage  Patient was extubated taken to recovery room in stable condition  SURGEON:  Surgeon(s) and Role:    * Carole Civil, MD - Primary  PHYSICIAN ASSISTANT:   ASSISTANTS: none   ANESTHESIA:   general  EBL:  none BLOOD ADMINISTERED:none  DRAINS: none   LOCAL MEDICATIONS USED:  MARCAINE     SPECIMEN:  No Specimen  DISPOSITION OF SPECIMEN:  N/A  COUNTS:  YES  TOURNIQUET:  * No tourniquets in log *  DICTATION: .Dragon Dictation  PLAN OF CARE: Discharge to home after PACU  PATIENT DISPOSITION:  PACU - hemodynamically stable.   Delay start of Pharmacological VTE agent (>24hrs) due to surgical blood loss or risk of bleeding: not applicable

## 2021-10-09 NOTE — Anesthesia Postprocedure Evaluation (Signed)
Anesthesia Post Note  Patient: Roy [name]  Procedure(s) Performed: KNEE ARTHROSCOPY WITH LATERAL AND MEDIAL MENISCECTOMY (Left: Knee)  Patient location during evaluation: PACU Anesthesia Type: General Level of consciousness: awake and alert Pain management: pain level controlled Vital Signs Assessment: post-procedure vital signs reviewed and stable Respiratory status: spontaneous breathing, nonlabored ventilation, respiratory function stable and patient connected to nasal cannula oxygen Cardiovascular status: blood pressure returned to baseline and stable Postop Assessment: no apparent nausea or vomiting Anesthetic complications: no   No notable events documented.   Last Vitals:  Vitals:   10/09/21 1315 10/09/21 1335  BP: 118/63 113/62  Pulse: 60 64  Resp: 14 14  Temp:  36.7 C  SpO2: 95% 93%    Last Pain:  Vitals:   10/09/21 1335  TempSrc: Oral  PainSc: 2                  Glynis Smiles

## 2021-10-09 NOTE — Anesthesia Procedure Notes (Addendum)
Procedure Name: Intubation Date/Time: 10/09/2021 11:45 AM Performed by: Orlie Dakin, CRNA Pre-anesthesia Checklist: Patient identified, Emergency Drugs available, Suction available and Patient being monitored Patient Re-evaluated:Patient Re-evaluated prior to induction Oxygen Delivery Method: Circle system utilized Preoxygenation: Pre-oxygenation with 100% oxygen Induction Type: IV induction Laryngoscope Size: Glidescope and 3 Grade View: Grade I Tube type: Oral Tube size: 7.5 mm Number of attempts: 1 Airway Equipment and Method: Rigid stylet and Video-laryngoscopy Placement Confirmation: ETT inserted through vocal cords under direct vision, positive ETCO2 and breath sounds checked- equal and bilateral Secured at: 23 cm Tube secured with: Tape Dental Injury: Teeth and Oropharynx as per pre-operative assessment  Comments: Glidescope electively used due to H/O OSA, no CPAP use.  Short and very thick neck and very poor dentition.   Many teeth broken off upper front, chipped and missing.  None loose per patient.   4x4s bite block used.

## 2021-10-09 NOTE — Brief Op Note (Signed)
10/09/2021  12:48 PM  PATIENT:  Roy [name]  51 y.o. 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  Standard arthroscopic leg holder was placed around the left leg left leg was prepped and draped sterilely.  Left knee table because of potential size  After routine prep and drape of the left leg timeout was completed.  Images were up and reviewed.  Standard lateral portal was established the scope was placed into the joint followed by diagnostic arthroscopy.  Thorough tour of the joint starting in the suprapatellar pouch and continuing medially through the notch and then laterally  We then made a medial portal with a spinal needle and an 11 blade.  Next placed a curved torpedo shaver into the joint and treated the joint to gain better visualization.  There was a moderate amount of synovitis  We then used the same shaver to remove the torn fragments of meniscus.  This was followed by probing the meniscus.  Additional  shaving was performed and contouring of the remaining meniscus was done with a shaver and 50 degree ArthroCare wand  We then turned our attention to the lateral compartment where a small chondroplasty was done on the anterior portion of the lateral femoral condyle and tibial plateau as well as trimming of the free edge tear of lateral meniscus  Synovectomy was performed in the suprapatellar pouch to clear debris and synovium that looked hypertrophic  This was followed by thorough irrigation and injection of 30 cc of Marcaine with epinephrine  3-0 nylon suture conditions to close the portals.  The dressing included Xeroform 4 x 4's ABD cast padding and Ace bandage  Patient was extubated taken to recovery room in stable condition  SURGEON:  Surgeon(s) and Role:    * Carole Civil, MD - Primary  PHYSICIAN ASSISTANT:   ASSISTANTS: none   ANESTHESIA:   general  EBL:  none BLOOD ADMINISTERED:none  DRAINS: none   LOCAL MEDICATIONS USED:  MARCAINE     SPECIMEN:  No Specimen  DISPOSITION OF SPECIMEN:  N/A  COUNTS:  YES  TOURNIQUET:  * No tourniquets in log *  DICTATION: .Dragon Dictation  PLAN OF CARE: Discharge to home after PACU  PATIENT DISPOSITION:  PACU - hemodynamically stable.   Delay start of Pharmacological VTE agent (>24hrs) due to surgical blood loss or risk of bleeding: not applicable

## 2021-10-09 NOTE — Brief Op Note (Signed)
10/09/2021  12:54 PM  PATIENT:  Roy [name]  51 y.o. 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  Standard arthroscopic leg holder was placed around the left leg left leg was prepped and draped sterilely.  Left knee table because of potential size  After routine prep and drape of the left leg timeout was completed.  Images were up and reviewed.  Standard lateral portal was established the scope was placed into the joint followed by diagnostic arthroscopy.  Thorough tour of the joint starting in the suprapatellar pouch and continuing medially through the notch and then laterally  We then made a medial portal with a spinal needle and an 11 blade.  Next placed a curved torpedo shaver into the joint and treated the joint to gain better visualization.  There was a moderate amount of synovitis  We then used the same shaver to remove the torn fragments of meniscus.  This was followed by probing the meniscus.  Additional  shaving was performed and contouring of the remaining meniscus was done with a shaver and 50 degree ArthroCare wand  We then turned our attention to the lateral compartment where a small chondroplasty was done on the anterior portion of the lateral femoral condyle and tibial plateau as well as trimming of the free edge tear of lateral meniscus  Synovectomy was performed in the suprapatellar pouch to clear debris and synovium that looked hypertrophic  This was followed by thorough irrigation and injection of 30 cc of Marcaine with epinephrine  3-0 nylon suture conditions to close the portals.  The dressing included Xeroform 4 x 4's ABD cast padding and Ace bandage  Patient was extubated taken to recovery room in stable condition  SURGEON:  Surgeon(s) and Role:    * Carole Civil, MD - Primary  PHYSICIAN ASSISTANT:   ASSISTANTS: none   ANESTHESIA:   general  EBL:  none BLOOD ADMINISTERED:none  DRAINS: none   LOCAL MEDICATIONS USED:  MARCAINE     SPECIMEN:  No Specimen  DISPOSITION OF SPECIMEN:  N/A  COUNTS:  YES  TOURNIQUET:  * No tourniquets in log *  DICTATION: .Dragon Dictation  PLAN OF CARE: Discharge to home after PACU  PATIENT DISPOSITION:  PACU - hemodynamically stable.   Delay start of Pharmacological VTE agent (>24hrs) due to surgical blood loss or risk of bleeding: not applicable

## 2021-10-10 ENCOUNTER — Encounter (HOSPITAL_COMMUNITY): Payer: Self-pay | Admitting: Orthopedic Surgery

## 2021-10-10 ENCOUNTER — Telehealth: Payer: Self-pay | Admitting: Radiology

## 2021-10-10 NOTE — Telephone Encounter (Signed)
I called him told him to remove wrap and ice / ice / ice Told him today should be the worst day for the pain should notice gradual improvement Take meds as directed He voiced understanding.

## 2021-10-10 NOTE — Telephone Encounter (Signed)
Daughter called and asked if patient should be in as much pain as he is?  Knee is throbbing.  Please call to discuss.

## 2021-10-15 DIAGNOSIS — Z9889 Other specified postprocedural states: Secondary | ICD-10-CM | POA: Insufficient documentation

## 2021-10-17 ENCOUNTER — Encounter: Payer: Self-pay | Admitting: Orthopedic Surgery

## 2021-10-17 ENCOUNTER — Emergency Department (HOSPITAL_COMMUNITY): Payer: Medicare HMO

## 2021-10-17 ENCOUNTER — Emergency Department (HOSPITAL_COMMUNITY)
Admission: EM | Admit: 2021-10-17 | Discharge: 2021-10-18 | Disposition: A | Discharge status: Home / Self Care | Payer: Medicare HMO | Attending: Emergency Medicine | Admitting: Emergency Medicine

## 2021-10-17 ENCOUNTER — Other Ambulatory Visit: Payer: Self-pay

## 2021-10-17 ENCOUNTER — Ambulatory Visit (INDEPENDENT_AMBULATORY_CARE_PROVIDER_SITE_OTHER): Payer: Medicare HMO | Admitting: Orthopedic Surgery

## 2021-10-17 ENCOUNTER — Encounter (HOSPITAL_COMMUNITY): Payer: Self-pay

## 2021-10-17 DIAGNOSIS — B9789 Other viral agents as the cause of diseases classified elsewhere: Secondary | ICD-10-CM | POA: Diagnosis not present

## 2021-10-17 DIAGNOSIS — U071 COVID-19: Secondary | ICD-10-CM | POA: Insufficient documentation

## 2021-10-17 DIAGNOSIS — J069 Acute upper respiratory infection, unspecified: Secondary | ICD-10-CM | POA: Diagnosis not present

## 2021-10-17 DIAGNOSIS — R059 Cough, unspecified: Secondary | ICD-10-CM | POA: Diagnosis not present

## 2021-10-17 DIAGNOSIS — Z9889 Other specified postprocedural states: Secondary | ICD-10-CM

## 2021-10-17 DIAGNOSIS — R051 Acute cough: Secondary | ICD-10-CM | POA: Diagnosis not present

## 2021-10-17 DIAGNOSIS — R509 Fever, unspecified: Secondary | ICD-10-CM | POA: Diagnosis present

## 2021-10-17 LAB — RESP PANEL BY RT-PCR (FLU A&B, COVID) ARPGX2
Influenza A by PCR: NEGATIVE
Influenza B by PCR: NEGATIVE
SARS Coronavirus 2 by RT PCR: POSITIVE — AB

## 2021-10-17 MED ORDER — IPRATROPIUM-ALBUTEROL 0.5-2.5 (3) MG/3ML IN SOLN
3.0000 mL | Freq: Once | RESPIRATORY_TRACT | Status: AC
  Administered 2021-10-17: 23:00:00 3 mL via RESPIRATORY_TRACT
  Filled 2021-10-17: qty 3

## 2021-10-17 MED ORDER — ACETAMINOPHEN 500 MG PO TABS
1000.0000 mg | ORAL_TABLET | Freq: Once | ORAL | Status: AC
Start: 2021-10-17 — End: 2021-10-17
  Administered 2021-10-17: 22:00:00 1000 mg via ORAL
  Filled 2021-10-17: qty 2

## 2021-10-17 MED ORDER — DEXAMETHASONE SODIUM PHOSPHATE 10 MG/ML IJ SOLN
10.0000 mg | Freq: Once | INTRAMUSCULAR | Status: AC
  Administered 2021-10-17: 22:00:00 10 mg via INTRAMUSCULAR
  Filled 2021-10-17: qty 1

## 2021-10-17 NOTE — Patient Instructions (Signed)
Ice 3 x a day  Do home exercises   Use the walker   Return 3 weeks

## 2021-10-17 NOTE — ED Provider Notes (Signed)
[facility] EMERGENCY DEPARTMENT Provider Note   CSN: [id number] Arrival date & time: 10/17/21  2113     History  Chief Complaint  Patient presents with   Fever   Chills    Roy [name] is a 51 y.o. male who presents to the ED complaining of fever x3 days.  He did not take his temperature at home.  He has associated nasal congestion, productive cough, shortness of breath.  He has not tried any medications at home for his symptoms.  Denies chest pain, nausea, vomiting, abdominal pain.  Denies sick contacts.  Denies anticoagulant use, HRT, malignancy, DVT/PE.  Per patient chart review: Patient had a recent arthroscopic procedure to the left knee on 10/09/2021.  He was evaluated by his orthopedist today in the office.  The history is provided by the patient. No language interpreter was used.      Home Medications Prior to Admission medications   Medication Sig Start Date End Date Taking? Authorizing Provider  acetaminophen (TYLENOL) 500 MG tablet Take 1,000 mg by mouth every 6 (six) hours as needed for headache (pain).     [provider]  albuterol (VENTOLIN HFA) 108 (90 Base) MCG/ACT inhaler Inhale 1-2 puffs into the lungs every 6 (six) hours as needed for wheezing or shortness of breath. 12/14/20   Claretta Fraise, MD  ALPRAZolam Duanne Moron) 0.5 MG tablet Take 1 tablet (0.5 mg total) by mouth 3 (three) times daily as needed. for anxiety 09/04/21   Claretta Fraise, MD  Ascorbic Acid (VITAMIN C) 1000 MG tablet Take 1,000 mg by mouth daily.    [provider]  carvedilol (COREG) 6.25 MG tablet Take 1 tablet (6.25 mg total) by mouth 2 (two) times daily with a meal. 09/04/21   Claretta Fraise, MD  dexlansoprazole (DEXILANT) 60 MG capsule Take 1 capsule (60 mg total) by mouth daily. 09/04/21   Claretta Fraise, MD  DULoxetine (CYMBALTA) 60 MG capsule Take 2 capsules by mouth once daily 09/28/21   Claretta Fraise, MD  gemfibrozil (LOPID) 600 MG tablet Take 1 tablet by mouth twice  daily 08/20/21   Claretta Fraise, MD  HYDROcodone-acetaminophen [facility]) 10-325 MG tablet Take 1 tablet by mouth every 4 (four) hours as needed. 10/09/21   Carole Civil, MD  levothyroxine (SYNTHROID) 50 MCG tablet Take 1 tablet by mouth once daily 07/06/21   Claretta Fraise, MD  linaclotide [facility]) 290 MCG CAPS capsule Take 1 capsule (290 mcg total) by mouth daily as needed (constipation). 06/07/20   Claretta Fraise, MD  meloxicam (MOBIC) 7.5 MG tablet Take 1 tablet (7.5 mg total) by mouth daily. 09/20/21   Carole Civil, MD  promethazine-dextromethorphan (PROMETHAZINE-DM) 6.25-15 MG/5ML syrup Take 5 mLs by mouth 4 (four) times daily as needed for cough. 06/22/21   Hassell Done, Mary-Margaret, FNP  QUEtiapine (SEROQUEL) 200 MG tablet TAKE 1 TABLET BY MOUTH AT BEDTIME FOR NERVES 04/10/21   Claretta Fraise, MD  rosuvastatin (CRESTOR) 5 MG tablet Take 1 tablet (5 mg total) by mouth daily. For cholesterol 09/12/21   Claretta Fraise, MD  sildenafil (REVATIO) 20 MG tablet Take 2-5 tablets daily as needed for sex 12/07/19   Claretta Fraise, MD  SYMBICORT 160-4.5 MCG/ACT inhaler INHALE 2 PUFFS BY MOUTH TWICE DAILY FOR ASTHMA 09/18/21   Claretta Fraise, MD  traZODone (DESYREL) 100 MG tablet Take 1 tablet (100 mg total) by mouth at bedtime. 09/04/21   Claretta Fraise, MD  Vitamin D, Ergocalciferol, (DRISDOL) 1.25 MG (50000 UNIT) CAPS capsule Take 1 capsule (  50,000 Units total) by mouth every 7 (seven) days. Patient taking differently: Take 50,000 Units by mouth every Monday. 09/12/21 09/11/22  Claretta Fraise, MD  zinc gluconate 50 MG tablet Take 50 mg by mouth daily.    [provider]  famotidine (PEPCID) 20 MG tablet Take 1 tablet (20 mg total) by mouth 2 (two) times daily for 14 days. 06/09/19 06/28/20  Claretta Fraise, MD  Testosterone (ANDROGEL PUMP) 20.25 MG/ACT (1.62%) GEL Apply 4 pumps daily to upper chest and shoulders Patient not taking: Reported on 06/02/2020 12/07/19 06/02/20  Claretta Fraise, MD       Allergies    Penicillins and Chantix [varenicline]    Review of Systems   Review of Systems  Constitutional:  Positive for chills and fever.  HENT:  Positive for congestion. Negative for rhinorrhea.   Respiratory:  Positive for cough and shortness of breath.   Gastrointestinal:  Negative for abdominal pain, nausea and vomiting.  All other systems reviewed and are negative.  Physical Exam Updated Vital Signs BP (!) 124/95    Pulse (!) 104    Temp (!) 101.6 F (38.7 C) (Oral)    Resp 20    Ht 5\' 8"  (1.727 m)    Wt (!) 158.8 kg    SpO2 94%    BMI 53.22 kg/m  Physical Exam Vitals and nursing note reviewed.  Constitutional:      General: He is not in acute distress.    Appearance: He is not diaphoretic.  HENT:     Head: Normocephalic and atraumatic.     Mouth/Throat:     Pharynx: No oropharyngeal exudate.  Eyes:     General: No scleral icterus.    Conjunctiva/sclera: Conjunctivae normal.  Cardiovascular:     Rate and Rhythm: Normal rate and regular rhythm.     Pulses: Normal pulses.     Heart sounds: Normal heart sounds.  Pulmonary:     Effort: Pulmonary effort is normal. No respiratory distress.     Breath sounds: Wheezing present.     Comments: Expiratory wheezing noted throughout lung fields. Chest:     Chest wall: No tenderness.  Abdominal:     General: Bowel sounds are normal.     Palpations: Abdomen is soft. There is no mass.     Tenderness: There is no abdominal tenderness. There is no guarding or rebound.  Musculoskeletal:        General: Normal range of motion.     Cervical back: Normal range of motion and neck supple.  Skin:    General: Skin is warm and dry.  Neurological:     Mental Status: He is alert.  Psychiatric:        Behavior: Behavior normal.    ED Results / Procedures / Treatments   Labs (all labs ordered are listed, but only abnormal results are displayed) Labs Reviewed  RESP PANEL BY RT-PCR (FLU A&B, COVID) [id number]     EKG None  Radiology DG Chest 2 View  Result Date: 10/17/2021 CLINICAL DATA:  Cough EXAM: CHEST - 2 VIEW COMPARISON:  07/02/2021 FINDINGS: Heart and mediastinal contours are within normal limits. No focal opacities or effusions. No acute bony abnormality. IMPRESSION: No active cardiopulmonary disease. Electronically Signed   By: Rolm Baptise M.D.   On: 10/17/2021 22:44    Procedures Procedures    Medications Ordered in ED Medications  acetaminophen (TYLENOL) tablet 1,000 mg (1,000 mg Oral Given 10/17/21 2209)  ipratropium-albuterol (DUONEB) 0.5-2.5 (3) MG/3ML nebulizer solution  3 mL (3 mLs Nebulization Given 10/17/21 2251)  dexamethasone (DECADRON) injection 10 mg (10 mg Intramuscular Given 10/17/21 2210)    ED Course/ Medical Decision Making/ A&P                           Medical Decision Making Amount and/or Complexity of Data Reviewed Radiology: ordered.  Risk OTC drugs. Prescription drug management.   Patient with fever, chills, productive cough onset 3 days.  Denies sick contacts.  Vital signs with elevated temperature of 101.6, elevated pulse rate at 115, respirations elevated at 22.  On exam patient with expiratory wheezing noted throughout lung fields, no chest wall tenderness.  Differential diagnosis includes COVID, flu, pneumonia, viral URI with cough.   Labs:  I ordered, and personally interpreted labs.  The pertinent results include:  COVID and flu swab ordered with results pending at time of signout.  Imaging: I ordered imaging studies including chest x-ray I independently visualized and interpreted imaging which showed no acute cardiopulmonary process. I agree with the radiologist interpretation  Medications:  I ordered medication including DuoNeb, Decadron, Tylenol for breathing treatment and fever.  Reevaluation of the patient after these medicines showed that the patient stayed the same I have reviewed the patients home medicines and have made  adjustments as needed  Reevaluation: After the interventions noted above, I reevaluated the patient and found that they have :stayed the same  Patient case discussed with Dr. Dina Rich at sign-out. Plan at sign-out is pending COVID and flu swab, fever control, symptom control.  If vitals improving, feel patient is safe for discharge home.  Patient care transferred at sign out.    This chart was dictated using voice recognition software, Dragon. Despite the best efforts of this provider to proofread and correct errors, errors may still occur which can change documentation meaning.  Final Clinical Impression(s) / ED Diagnoses Final diagnoses:  Viral upper respiratory tract infection    Rx / DC Orders ED Discharge Orders     None         Demitria Hay A, PA-C 10/17/21 2312    Carmin Muskrat, MD 10/18/21 1743

## 2021-10-17 NOTE — ED Triage Notes (Signed)
Patient states fever and chills for 3 days but now developed fever today and states congestion and coughing up light green mucus. Denies covid exposure.

## 2021-10-17 NOTE — Progress Notes (Signed)
POST OP   Chief Complaint  Patient presents with   Routine Post Op    DOS 10/09/21 S/p LT knee arthroscopy    Encounter Diagnosis  Name Primary?   S/P left knee arthroscopy 10/09/21 Yes    PROCEDURE: Arthroscopy left knee medial lateral meniscectomy grade 4 chondral lesion tibial plateau grade 1 lateral compartment grade 2 lateral femoral condyle and tibial plateau grade 2 patellofemoral joint moderate synovitis  POV # 1  Meds related: [employer]  Sutures were taken out portals patient advised to start home exercise program continue ice follow-up in 3 weeks continue use of walker until he sees me

## 2021-10-18 ENCOUNTER — Telehealth: Payer: Self-pay | Admitting: Orthopedic Surgery

## 2021-10-18 MED ORDER — NIRMATRELVIR/RITONAVIR (PAXLOVID)TABLET: ORAL_TABLET | ORAL | 0 refills | Status: DC

## 2021-10-18 NOTE — Discharge Instructions (Signed)
You tested positive for COVID-19.  You should quarantine for 5 days or until you are symptom-free.  You will be given a prescription for the antiviral.

## 2021-10-18 NOTE — Telephone Encounter (Signed)
Patient's wife/designated contact called to relay that patient began not feeling well yesterday afternoon, and states he tested positive for Covid. Since he was here yesterday for appointment, at which time he answered "no" to all screening questions, just wanted to let us know.

## 2021-10-26 ENCOUNTER — Encounter: Payer: Self-pay | Admitting: Nurse Practitioner

## 2021-10-26 ENCOUNTER — Ambulatory Visit (INDEPENDENT_AMBULATORY_CARE_PROVIDER_SITE_OTHER): Payer: Medicare HMO | Admitting: Nurse Practitioner

## 2021-10-26 VITALS — BP 135/81 | HR 70 | Temp 97.8°F | Resp 20 | Ht 68.0 in | Wt 350.0 lb

## 2021-10-26 DIAGNOSIS — J40 Bronchitis, not specified as acute or chronic: Secondary | ICD-10-CM | POA: Diagnosis not present

## 2021-10-26 MED ORDER — AZITHROMYCIN 250 MG PO TABS: ORAL_TABLET | ORAL | 0 refills | Status: DC

## 2021-10-26 MED ORDER — BENZONATATE 100 MG PO CAPS
100.0000 mg | ORAL_CAPSULE | Freq: Two times a day (BID) | ORAL | 0 refills | Status: DC | PRN
Start: 2021-10-26 — End: 2021-11-07

## 2021-10-26 NOTE — Progress Notes (Signed)
Subjective:    Patient ID: Roy [name], male    DOB: Feb 08, 1971, 51 y.o.   MRN: [id number]   Chief Complaint: chest congestion (Diagnosed with Covid on 1-25 and given meds)   URI  The current episode started 1 to 4 weeks ago. The problem has been unchanged. The maximum temperature recorded prior to his arrival was 101 - 101.9 F. The fever has been present for 3 to 4 days. Associated symptoms include congestion, coughing, headaches, rhinorrhea and a sore throat. Pertinent negatives include no wheezing. Tested positive for covid on Jan 25 and was given paxlovid. He still has bad cough and phlegm has turned from clear to green.     Review of Systems  Constitutional:  Negative for chills and fever (not now).  HENT:  Positive for congestion, rhinorrhea and sore throat.   Respiratory:  Positive for cough. Negative for shortness of breath and wheezing.   Musculoskeletal:  Negative for myalgias.  Neurological:  Positive for headaches.      Objective:   Physical Exam Vitals and nursing note reviewed.  Constitutional:      Appearance: Normal appearance. He is well-developed.  HENT:     Head: Normocephalic.     Nose: Nose normal.     Mouth/Throat:     Mouth: Mucous membranes are moist.     Pharynx: Oropharynx is clear.  Eyes:     Pupils: Pupils are equal, round, and reactive to light.  Neck:     Thyroid: No thyroid mass or thyromegaly.     Vascular: No carotid bruit or JVD.     Trachea: Phonation normal.  Cardiovascular:     Rate and Rhythm: Normal rate and regular rhythm.  Pulmonary:     Effort: Pulmonary effort is normal. No respiratory distress.     Breath sounds: Normal breath sounds. No wheezing or rales.     Comments: Deep cough Abdominal:     General: Bowel sounds are normal.     Palpations: Abdomen is soft.     Tenderness: There is no abdominal tenderness.  Musculoskeletal:        General: Normal range of motion.     Cervical back: Normal range of motion and neck  supple.  Lymphadenopathy:     Cervical: No cervical adenopathy.  Skin:    General: Skin is warm and dry.  Neurological:     Mental Status: He is alert and oriented to person, place, and time.  Psychiatric:        Behavior: Behavior normal.        Thought Content: Thought content normal.        Judgment: Judgment normal.   BP 135/81    Pulse 70    Temp 97.8 F (36.6 C) (Temporal)    Resp 20    Ht 5\' 8"  (1.727 m)    Wt (!) 350 lb (158.8 kg)    SpO2 96%    BMI 53.22 kg/m         Assessment & Plan:  NUMAN ZYLSTRA in today with chief complaint of chest congestion (Diagnosed with Covid on 1-25 and given meds)   1. Bronchitis Force fluids Run humidifier Rest  RTO prn - azithromycin (ZITHROMAX Z-PAK) 250 MG tablet; As directed  Dispense: 6 tablet; Refill: 0 - benzonatate (TESSALON) 100 MG capsule; Take 1 capsule (100 mg total) by mouth 2 (two) times daily as needed for cough.  Dispense: 20 capsule; Refill: 0    The above assessment  and management plan was discussed with the patient. The patient verbalized understanding of and has agreed to the management plan. Patient is aware to call the clinic if symptoms persist or worsen. Patient is aware when to return to the clinic for a follow-up visit. Patient educated on when it is appropriate to go to the emergency department.   Mary-Margaret Daphine Deutscher, FNP

## 2021-10-26 NOTE — Patient Instructions (Signed)

## 2021-11-07 ENCOUNTER — Encounter: Payer: Self-pay | Admitting: Orthopedic Surgery

## 2021-11-07 ENCOUNTER — Other Ambulatory Visit: Payer: Self-pay

## 2021-11-07 ENCOUNTER — Ambulatory Visit (INDEPENDENT_AMBULATORY_CARE_PROVIDER_SITE_OTHER): Payer: Medicare HMO | Admitting: Orthopedic Surgery

## 2021-11-07 DIAGNOSIS — Z9889 Other specified postprocedural states: Secondary | ICD-10-CM

## 2021-11-07 DIAGNOSIS — G8929 Other chronic pain: Secondary | ICD-10-CM

## 2021-11-07 DIAGNOSIS — M1710 Unilateral primary osteoarthritis, unspecified knee: Secondary | ICD-10-CM

## 2021-11-07 DIAGNOSIS — M25562 Pain in left knee: Secondary | ICD-10-CM

## 2021-11-07 DIAGNOSIS — M1712 Unilateral primary osteoarthritis, left knee: Secondary | ICD-10-CM

## 2021-11-07 MED ORDER — MELOXICAM 7.5 MG PO TABS: 7.5000 mg | ORAL_TABLET | Freq: Every day | ORAL | 5 refills | Status: DC

## 2021-11-07 NOTE — Progress Notes (Addendum)
Postop visit  Surgery date October 09, 2021  Procedure arthroscopy left knee medial lateral meniscectomy  Findings included torn medial and lateral meniscus multiple areas of chondral changes most notable grade 4 chondral lesion tibial plateau  Mr. Suminski continues to make modest improvement  He still complaining of soreness and stiffness of his left knee  His BMI is 50  His MRI showed a medial tibial stress reaction which we plan to treat for 8 weeks with protected weightbearing  His knee motion is now 115 degrees he still has weakness on extension and anterior to posterior laxity   We recommend he use a cane,  We tried to use a economy hinged brace but we could not get 1 to fit.  He would need a custom brace for that  He has a quad calf ratio mismatch    We will start him on meloxicam  He is advised on weight loss  Follow-up in 3 months  Encounter Diagnoses  Name Primary?   Chronic pain of left knee Yes   Primary localized osteoarthritis of knee    S/P left knee arthroscopy 10/09/21    Meds ordered this encounter  Medications   DISCONTD: meloxicam (MOBIC) 7.5 MG tablet    Sig: Take 1 tablet (7.5 mg total) by mouth daily.    Dispense:  30 tablet    Refill:  5   meloxicam (MOBIC) 7.5 MG tablet    Sig: Take 1 tablet (7.5 mg total) by mouth daily.    Dispense:  30 tablet    Refill:  5

## 2021-11-12 ENCOUNTER — Telehealth: Payer: Self-pay | Admitting: Radiology

## 2021-11-12 NOTE — Telephone Encounter (Signed)
Quad to calf ratio  And documented instability/ functional test Needed for the knee brace Can you do addendum ?

## 2021-11-16 ENCOUNTER — Other Ambulatory Visit: Payer: Self-pay | Admitting: Family Medicine

## 2021-11-21 DIAGNOSIS — M1712 Unilateral primary osteoarthritis, left knee: Secondary | ICD-10-CM | POA: Diagnosis not present

## 2021-12-15 ENCOUNTER — Other Ambulatory Visit: Payer: Self-pay | Admitting: Family Medicine

## 2021-12-15 DIAGNOSIS — J418 Mixed simple and mucopurulent chronic bronchitis: Secondary | ICD-10-CM

## 2021-12-24 ENCOUNTER — Ambulatory Visit (INDEPENDENT_AMBULATORY_CARE_PROVIDER_SITE_OTHER): Payer: Medicare HMO | Admitting: Family Medicine

## 2021-12-24 ENCOUNTER — Encounter: Payer: Self-pay | Admitting: Family Medicine

## 2021-12-24 ENCOUNTER — Ambulatory Visit (INDEPENDENT_AMBULATORY_CARE_PROVIDER_SITE_OTHER): Payer: Medicare HMO

## 2021-12-24 VITALS — BP 134/80 | HR 70 | Temp 98.2°F | Ht 68.0 in | Wt 347.0 lb

## 2021-12-24 DIAGNOSIS — R0602 Shortness of breath: Secondary | ICD-10-CM | POA: Diagnosis not present

## 2021-12-24 DIAGNOSIS — R0789 Other chest pain: Secondary | ICD-10-CM

## 2021-12-24 DIAGNOSIS — J40 Bronchitis, not specified as acute or chronic: Secondary | ICD-10-CM

## 2021-12-24 DIAGNOSIS — R059 Cough, unspecified: Secondary | ICD-10-CM | POA: Diagnosis not present

## 2021-12-24 DIAGNOSIS — R0989 Other specified symptoms and signs involving the circulatory and respiratory systems: Secondary | ICD-10-CM

## 2021-12-24 DIAGNOSIS — J329 Chronic sinusitis, unspecified: Secondary | ICD-10-CM | POA: Diagnosis not present

## 2021-12-24 MED ORDER — BETAMETHASONE SOD PHOS & ACET 6 (3-3) MG/ML IJ SUSP
6.0000 mg | Freq: Once | INTRAMUSCULAR | Status: AC
  Administered 2021-12-24: 6 mg via INTRAMUSCULAR

## 2021-12-24 MED ORDER — LEVOFLOXACIN 500 MG PO TABS: 500.0000 mg | ORAL_TABLET | Freq: Every day | ORAL | 0 refills | Status: DC

## 2021-12-24 NOTE — Addendum Note (Signed)
Addended by: Adella Hare B on: 12/24/2021 01:25 PM ? ? Modules accepted: Orders ? ?

## 2021-12-24 NOTE — Progress Notes (Signed)
Chief Complaint  ?Patient presents with  ? Chest Pain  ? Shortness of Breath  ? Nasal Congestion  ? ? ?HPI ?Onset 4 days ago with central chest  pain with dyspnea. Pain not sharp or dull. Pressure substernal. Felt feverish. Pain was not  positional. Intermittent. Lasts 15 min or so. Some whitish phlegm.  ?Patient presents today for Patient presents with upper respiratory congestion. Rhinorrhea that is frequently white mucoid. There is moderate sore throat. Patient reports not coughing. There is no chills or sweats. Gradually worsening. Tried OTCs without improvement. ? ?PMH: Smoking status noted ?ROS: Per HPI ? ?Objective: ?BP 134/80   Pulse 70   Temp 98.2 ?F (36.8 ?C)   Ht 5\' 8"  (1.727 m)   Wt (!) 347 lb (157.4 kg)   SpO2 97%   BMI 52.76 kg/m?  ?Gen: NAD, alert, cooperative with exam ?HEENT: NCAT, Nasal passages swollen, red TMS RED ?CV: RRR, good S1/S2, no murmur ?Resp: Bronchitis changes with scattered wheezes, non-labored ?Ext: No edema, warm ?Neuro: Alert and oriented, No gross deficits ? ?Assessment and plan: ? ?1. Other chest pain   ?2. Chest congestion   ?3. Sinobronchitis   ? ? ?Meds ordered this encounter  ?Medications  ? levofloxacin (LEVAQUIN) 500 MG tablet  ?  Sig: Take 1 tablet (500 mg total) by mouth daily. For 10 days  ?  Dispense:  10 tablet  ?  Refill:  0  ? ? ?Orders Placed This Encounter  ?Procedures  ? DG Chest 2 View  ?  Standing Status:   Future  ?  Number of Occurrences:   1  ?  Standing Expiration Date:   01/23/2022  ?  Order Specific Question:   Reason for Exam (SYMPTOM  OR DIAGNOSIS REQUIRED)  ?  Answer:   cough, dyspnea, COPD  ?  Order Specific Question:   Preferred imaging location?  ?  Answer:   Internal  ? ?32 minutes spent with pt defining sx including what is noncardiac chest pain and determining treatment plan. ?Follow up as needed. ? ?Claretta Fraise, MD ? ?

## 2021-12-25 NOTE — Progress Notes (Signed)
Your chest x-ray looked normal. Thanks, WS.

## 2021-12-27 ENCOUNTER — Encounter (HOSPITAL_COMMUNITY): Payer: Self-pay | Admitting: *Deleted

## 2021-12-27 ENCOUNTER — Emergency Department (HOSPITAL_COMMUNITY): Payer: Medicare HMO

## 2021-12-27 ENCOUNTER — Emergency Department (HOSPITAL_COMMUNITY)
Admission: EM | Admit: 2021-12-27 | Discharge: 2021-12-27 | Disposition: A | Discharge status: Home / Self Care | Payer: Medicare HMO | Attending: Emergency Medicine | Admitting: Emergency Medicine

## 2021-12-27 DIAGNOSIS — R072 Precordial pain: Secondary | ICD-10-CM | POA: Diagnosis not present

## 2021-12-27 DIAGNOSIS — Z79899 Other long term (current) drug therapy: Secondary | ICD-10-CM | POA: Insufficient documentation

## 2021-12-27 DIAGNOSIS — E039 Hypothyroidism, unspecified: Secondary | ICD-10-CM | POA: Diagnosis not present

## 2021-12-27 DIAGNOSIS — R0789 Other chest pain: Secondary | ICD-10-CM | POA: Diagnosis not present

## 2021-12-27 DIAGNOSIS — R0602 Shortness of breath: Secondary | ICD-10-CM | POA: Insufficient documentation

## 2021-12-27 DIAGNOSIS — R079 Chest pain, unspecified: Secondary | ICD-10-CM | POA: Diagnosis not present

## 2021-12-27 DIAGNOSIS — I10 Essential (primary) hypertension: Secondary | ICD-10-CM | POA: Insufficient documentation

## 2021-12-27 LAB — BASIC METABOLIC PANEL
Anion gap: 10 (ref 5–15)
BUN: 10 mg/dL (ref 6–20)
CO2: 24 mmol/L (ref 22–32)
Calcium: 9.4 mg/dL (ref 8.9–10.3)
Chloride: 104 mmol/L (ref 98–111)
Creatinine, Ser: 0.77 mg/dL (ref 0.61–1.24)
GFR, Estimated: >60 mL/min (ref >60)
Glucose, Bld: 169 mg/dL — ABNORMAL HIGH (ref 70–99)
Potassium: 3.7 mmol/L (ref 3.5–5.1)
Sodium: 138 mmol/L (ref 135–145)

## 2021-12-27 LAB — CBC
HCT: 41.6 % (ref 39.0–52.0)
Hemoglobin: 13.8 g/dL (ref 13.0–17.0)
MCH: 30.8 pg (ref 26.0–34.0)
MCHC: 33.2 g/dL (ref 30.0–36.0)
MCV: 92.9 fL (ref 80.0–100.0)
Platelets: 255 10*3/uL (ref 150–400)
RBC: 4.48 MIL/uL (ref 4.22–5.81)
RDW: 13.4 % (ref 11.5–15.5)
WBC: 9.8 10*3/uL (ref 4.0–10.5)
nRBC: 0 % (ref 0.0–0.2)

## 2021-12-27 LAB — TROPONIN I (HIGH SENSITIVITY)
Troponin I (High Sensitivity): 3 ng/L (ref <18)
Troponin I (High Sensitivity): 2 ng/L (ref <18)

## 2021-12-27 MED ORDER — ACETAMINOPHEN 500 MG PO TABS
1000.0000 mg | ORAL_TABLET | Freq: Once | ORAL | Status: AC
  Administered 2021-12-27: 1000 mg via ORAL
  Filled 2021-12-27: qty 2

## 2021-12-27 MED ORDER — ALUM & MAG HYDROXIDE-SIMETH 200-200-20 MG/5ML PO SUSP
30.0000 mL | Freq: Once | ORAL | Status: AC
Start: 2021-12-27 — End: 2021-12-27
  Administered 2021-12-27: 30 mL via ORAL
  Filled 2021-12-27: qty 30

## 2021-12-27 MED ORDER — FAMOTIDINE 20 MG PO TABS
20.0000 mg | ORAL_TABLET | Freq: Once | ORAL | Status: AC
  Administered 2021-12-27: 20 mg via ORAL
  Filled 2021-12-27: qty 1

## 2021-12-27 NOTE — ED Provider Notes (Signed)
?[facility] ?Provider Note ? ? ?CSN: [id number] ?Arrival date & time: 12/27/21  1712 ? ?  ? ?History ? ?Chief Complaint  ?Patient presents with  ? Chest Pain  ? ? ?Roy [name] is a 51 y.o. male. ? ?Patient with hx htn, c/o mid chest pain in past week, waxing/waning, at rest, dull, non radiating. Mild sob. No nv. No diaphoresis. No exertional pain. No pleuritic pain. Denies hx cad or fam hx cad. No cough or uri symptoms. No fever or chills. No leg pain or swelling. No recent surgery, trauma, or immobility. No hx dvt or pe. Occasional indigestion/? Hx gerd. Denies abd pain. No nv. No neck or back pain. Indicates saw pcp for same a few days ago, was given antibiotic for same.  ? ?The history is provided by the patient and medical records.  ?Chest Pain ?Associated symptoms: no abdominal pain, no back pain, no fever, no headache, no shortness of breath and no vomiting   ? ?  ? ?Home Medications ?Prior to Admission medications   ?Medication Sig Start Date End Date Taking? Authorizing Provider  ?acetaminophen (TYLENOL) 500 MG tablet Take 1,000 mg by mouth every 6 (six) hours as needed for headache (pain).     [provider]  ?albuterol (VENTOLIN HFA) 108 (90 Base) MCG/ACT inhaler Inhale 1-2 puffs into the lungs every 6 (six) hours as needed for wheezing or shortness of breath. 12/14/20   Claretta Fraise, MD  ?ALPRAZolam Duanne Moron) 0.5 MG tablet Take 1 tablet (0.5 mg total) by mouth 3 (three) times daily as needed. for anxiety 09/04/21   Claretta Fraise, MD  ?Ascorbic Acid (VITAMIN C) 1000 MG tablet Take 1,000 mg by mouth daily.    [provider]  ?carvedilol (COREG) 6.25 MG tablet Take 1 tablet (6.25 mg total) by mouth 2 (two) times daily with a meal. 09/04/21   Claretta Fraise, MD  ?dexlansoprazole (DEXILANT) 60 MG capsule Take 1 capsule (60 mg total) by mouth daily. 09/04/21   Claretta Fraise, MD  ?DULoxetine (CYMBALTA) 60 MG capsule Take 2 capsules by mouth once daily 09/28/21   Claretta Fraise, MD  ?gemfibrozil (LOPID) 600 MG tablet Take 1 tablet by mouth twice daily 11/16/21   Claretta Fraise, MD  ?levofloxacin (LEVAQUIN) 500 MG tablet Take 1 tablet (500 mg total) by mouth daily. For 10 days 12/24/21   Claretta Fraise, MD  ?levothyroxine (SYNTHROID) 50 MCG tablet Take 1 tablet by mouth once daily 07/06/21   Claretta Fraise, MD  ?linaclotide Rolan Lipa) 290 MCG CAPS capsule Take 1 capsule (290 mcg total) by mouth daily as needed (constipation). 06/07/20   Claretta Fraise, MD  ?meloxicam (MOBIC) 7.5 MG tablet Take 1 tablet (7.5 mg total) by mouth daily. 11/07/21   Carole Civil, MD  ?QUEtiapine (SEROQUEL) 200 MG tablet TAKE 1 TABLET BY MOUTH AT BEDTIME FOR NERVES 04/10/21   Claretta Fraise, MD  ?rosuvastatin (CRESTOR) 5 MG tablet Take 1 tablet (5 mg total) by mouth daily. For cholesterol 09/12/21   Claretta Fraise, MD  ?sildenafil (REVATIO) 20 MG tablet Take 2-5 tablets daily as needed for sex 12/07/19   Claretta Fraise, MD  ?[facility] 160-4.5 MCG/ACT inhaler INHALE 2 PUFFS BY MOUTH TWICE DAILY FOR ASTHMA 12/17/21   Claretta Fraise, MD  ?traZODone (DESYREL) 100 MG tablet Take 1 tablet (100 mg total) by mouth at bedtime. 09/04/21   Claretta Fraise, MD  ?Vitamin D, Ergocalciferol, (DRISDOL) 1.25 MG (50000 UNIT) CAPS capsule Take 1 capsule (50,000 Units total) by  mouth every 7 (seven) days. ?Patient taking differently: Take 50,000 Units by mouth every Monday. 09/12/21 09/11/22  Claretta Fraise, MD  ?zinc gluconate 50 MG tablet Take 50 mg by mouth daily.    [provider]  ?famotidine (PEPCID) 20 MG tablet Take 1 tablet (20 mg total) by mouth 2 (two) times daily for 14 days. 06/09/19 06/28/20  Claretta Fraise, MD  ?Testosterone (ANDROGEL PUMP) 20.25 MG/ACT (1.62%) GEL Apply 4 pumps daily to upper chest and shoulders ?Patient not taking: Reported on 06/02/2020 12/07/19 06/02/20  Claretta Fraise, MD  ?   ? ?Allergies    ?Penicillins and Chantix [varenicline]   ? ?Review of Systems   ?Review of Systems   ?Constitutional:  Negative for fever.  ?HENT:  Negative for sore throat.   ?Eyes:  Negative for redness.  ?Respiratory:  Negative for shortness of breath.   ?Cardiovascular:  Positive for chest pain.  ?Gastrointestinal:  Negative for abdominal pain and vomiting.  ?Genitourinary:  Negative for flank pain.  ?Musculoskeletal:  Negative for back pain and neck pain.  ?Skin:  Negative for rash.  ?Neurological:  Negative for headaches.  ?Hematological:  Does not bruise/bleed easily.  ?Psychiatric/Behavioral:  Negative for confusion.   ? ?Physical Exam ?Updated Vital Signs ?BP (!) 150/99 (BP Location: Right Arm)   Pulse 90   Temp (!) 97.4 ?F (36.3 ?C) (Oral)   Resp 20   Ht 1.727 m (5\' 8" )   Wt (!) 147.4 kg   SpO2 96%   BMI 49.42 kg/m?  ?Physical Exam ?Vitals and nursing note reviewed.  ?Constitutional:   ?   Appearance: Normal appearance. He is well-developed.  ?HENT:  ?   Head: Atraumatic.  ?   Nose: Nose normal.  ?   Mouth/Throat:  ?   Mouth: Mucous membranes are moist.  ?Eyes:  ?   General: No scleral icterus. ?   Conjunctiva/sclera: Conjunctivae normal.  ?Neck:  ?   Trachea: No tracheal deviation.  ?Cardiovascular:  ?   Rate and Rhythm: Normal rate and regular rhythm.  ?   Pulses: Normal pulses.  ?   Heart sounds: Normal heart sounds. No murmur heard. ?  No friction rub. No gallop.  ?Pulmonary:  ?   Effort: Pulmonary effort is normal. No accessory muscle usage or respiratory distress.  ?   Breath sounds: Normal breath sounds.  ?   Comments: Mild chest wall tenderness. No sts.  ?Abdominal:  ?   General: Bowel sounds are normal. There is no distension.  ?   Palpations: Abdomen is soft.  ?   Tenderness: There is no abdominal tenderness. There is no guarding.  ?Genitourinary: ?   Comments: No cva tenderness. ?Musculoskeletal:     ?   General: No swelling or tenderness.  ?   Cervical back: Normal range of motion and neck supple. No rigidity.  ?   Right lower leg: No edema.  ?   Left lower leg: No edema.  ?Skin: ?    General: Skin is warm and dry.  ?   Findings: No rash.  ?Neurological:  ?   Mental Status: He is alert.  ?   Comments: Alert, speech clear.   ?Psychiatric:     ?   Mood and Affect: Mood normal.  ? ? ?ED Results / Procedures / Treatments   ?Labs ?(all labs ordered are listed, but only abnormal results are displayed) ?Results for orders placed or performed during the hospital encounter of 12/27/21  ?Basic metabolic panel  ?  Result Value Ref Range  ? Sodium 138 135 - 145 mmol/L  ? Potassium 3.7 3.5 - 5.1 mmol/L  ? Chloride 104 98 - 111 mmol/L  ? CO2 24 22 - 32 mmol/L  ? Glucose, Bld 169 (H) 70 - 99 mg/dL  ? BUN 10 6 - 20 mg/dL  ? Creatinine, Ser 0.77 0.61 - 1.24 mg/dL  ? Calcium 9.4 8.9 - 10.3 mg/dL  ? GFR, Estimated >60 >60 mL/min  ? Anion gap 10 5 - 15  ?CBC  ?Result Value Ref Range  ? WBC 9.8 4.0 - 10.5 K/uL  ? RBC 4.48 4.22 - 5.81 MIL/uL  ? Hemoglobin 13.8 13.0 - 17.0 g/dL  ? HCT 41.6 39.0 - 52.0 %  ? MCV 92.9 80.0 - 100.0 fL  ? MCH 30.8 26.0 - 34.0 pg  ? MCHC 33.2 30.0 - 36.0 g/dL  ? RDW 13.4 11.5 - 15.5 %  ? Platelets 255 150 - 400 K/uL  ? nRBC 0.0 0.0 - 0.2 %  ?Troponin I (High Sensitivity)  ?Result Value Ref Range  ? Troponin I (High Sensitivity) 3 <18 ng/L  ?Troponin I (High Sensitivity)  ?Result Value Ref Range  ? Troponin I (High Sensitivity) 2 <18 ng/L  ? ?DG Chest 2 View ? ?Result Date: 12/27/2021 ?CLINICAL DATA:  Chest pain for 1 week EXAM: CHEST - 2 VIEW COMPARISON:  12/24/2021 FINDINGS: Normal heart size, mediastinal contours, and pulmonary vascularity. Lungs clear. No pleural effusion or pneumothorax. Minor scattered endplate spur formation thoracic spine. IMPRESSION: No acute abnormalities. Electronically Signed   By: Lavonia Dana M.D.   On: 12/27/2021 17:57  ? ?DG Chest 2 View ? ?Result Date: 12/24/2021 ?CLINICAL DATA:  Cough.  Shortness of breath. EXAM: CHEST - 2 VIEW COMPARISON:  October 17, 2021 FINDINGS: The heart size and mediastinal contours are within normal limits. Both lungs are clear. The  visualized skeletal structures are unremarkable. IMPRESSION: No active cardiopulmonary disease. Electronically Signed   By: Dorise Bullion III M.D.   On: 12/24/2021 20:20   ? ? ?EKG ?EKG Interpretation ? ?Date/Time:  Thursday Ap

## 2021-12-27 NOTE — Discharge Instructions (Addendum)
It was our pleasure to provide your ER care today - we hope that you feel better. ? ?For recent chest discomfort, follow up closely with cardiologist in the coming week - call office tomorrow AM to arrange appointment. ? ?If indigestion/gi symptoms, try taking pepcid and maalox for symptom relief. ? ?Also follow up closely with your doctor regarding your blood pressure, which is high today. ? ?Return to ER if worse, new symptoms, fevers, recurrent or persistent chest pain, increased trouble breathing, or other concern.  ?

## 2021-12-27 NOTE — ED Triage Notes (Signed)
Chest pain for over a week, has seen pcp for same ?

## 2022-01-01 ENCOUNTER — Other Ambulatory Visit: Payer: Self-pay | Admitting: Family Medicine

## 2022-01-01 ENCOUNTER — Ambulatory Visit (INDEPENDENT_AMBULATORY_CARE_PROVIDER_SITE_OTHER): Payer: Medicare HMO | Admitting: Family Medicine

## 2022-01-01 ENCOUNTER — Encounter: Payer: Self-pay | Admitting: Family Medicine

## 2022-01-01 VITALS — BP 136/85 | HR 82 | Temp 98.3°F | Ht 68.0 in | Wt 341.8 lb

## 2022-01-01 DIAGNOSIS — R7303 Prediabetes: Secondary | ICD-10-CM | POA: Diagnosis not present

## 2022-01-01 DIAGNOSIS — I10 Essential (primary) hypertension: Secondary | ICD-10-CM | POA: Diagnosis not present

## 2022-01-01 DIAGNOSIS — Z9889 Other specified postprocedural states: Secondary | ICD-10-CM | POA: Diagnosis not present

## 2022-01-01 MED ORDER — WEGOVY 0.5 MG/0.5ML ~~LOC~~ SOAJ: 0.5000 mg | SUBCUTANEOUS | 1 refills | Status: DC

## 2022-01-01 NOTE — Progress Notes (Signed)
? ?Subjective:  ?Patient ID: Roy [name], male    DOB: Jan 04, 1971  Age: 51 y.o. MRN: [id number] ? ?CC: Knee Pain (Left) ? ? ?HPI ?Roy [name] presents for left knee pain and prediabetes from his morbid obesity. Needs left knee replacement, but ortho declined to perform the procedure unless heloses 100 pounds. He has tried multiple diet programs unsuccessfully. Interested in trial GLP 1.  ? ?Pt. Also has BP difficult to control as a result of the obesity.  ? ? ?  01/01/2022  ?  3:39 PM 01/01/2022  ?  3:32 PM 12/24/2021  ?  9:02 AM  ?Depression screen PHQ 2/9  ?Decreased Interest 1 0 1  ?Down, Depressed, Hopeless 0 0 0  ?PHQ - 2 Score 1 0 1  ?Altered sleeping 1  1  ?Tired, decreased energy 2  1  ?Change in appetite 1  1  ?Feeling bad or failure about yourself  1  0  ?Trouble concentrating 0  0  ?Moving slowly or fidgety/restless 0  0  ?Suicidal thoughts 0  0  ?PHQ-9 Score 6  4  ?Difficult doing work/chores Not difficult at all  Not difficult at all  ? ? 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  ? ?He has a past surgical history that includes Rotator cuff repair (Right); Biceps tendon repair (Right); Neck surgery; and Knee arthroscopy with lateral menisectomy (Left, 10/09/2021).  ? ?His family history includes Arrhythmia in his father; Asthma in his brother and daughter; Bipolar disorder in his brother; COPD in his father; Fibromyalgia in his sister; Hypertension in his mother; Stroke (age of onset: 21) in his mother.He reports that he has been smoking cigarettes. He has a 15.00 pack-year smoking history. He quit smokeless tobacco use about 2 years ago.  His smokeless tobacco use included snuff and chew. He reports that he does not drink alcohol and does not use  drugs. ? ? ? ?ROS ?Review of Systems  ?Constitutional:  Negative for fever.  ?Respiratory:  Negative for shortness of breath.   ?Cardiovascular:  Negative for chest pain.  ?Musculoskeletal:  Positive for arthralgias (knees - left worse than right. Causes painful ambulation.).  ?Skin:  Negative for rash.  ? ?Objective:  ?BP 136/85   Pulse 82   Temp 98.3 ?F (36.8 ?C)   Ht '5\' 8"'  (1.727 m)   Wt (!) 341 lb 12.8 oz (155 kg)   SpO2 96%   BMI 51.97 kg/m?  ? ?BP Readings from Last 3 Encounters:  ?01/01/22 136/85  ?12/27/21 (!) 111/42  ?12/24/21 134/80  ? ? ?Wt Readings from Last 3 Encounters:  ?01/01/22 (!) 341 lb 12.8 oz (155 kg)  ?12/27/21 (!) 325 lb (147.4 kg)  ?12/24/21 (!) 347 lb (157.4 kg)  ? ? ? ?Physical Exam ?Vitals reviewed.  ?Constitutional:   ?   Appearance: He is well-developed.  ?HENT:  ?   Head: Normocephalic and atraumatic.  ?   Right Ear: External ear normal.  ?   Left Ear: External ear normal.  ?   Mouth/Throat:  ?   Pharynx: No oropharyngeal exudate or posterior oropharyngeal erythema.  ?Eyes:  ?   Pupils: Pupils are equal, round, and reactive to light.  ?Cardiovascular:  ?   Rate and Rhythm: Normal rate and regular rhythm.  ?   Heart sounds:  No murmur heard. ?Pulmonary:  ?   Effort: No respiratory distress.  ?   Breath sounds: Normal breath sounds.  ?Musculoskeletal:     ?   General: Swelling (left knee) present.  ?   Cervical back: Normal range of motion and neck supple.  ?Neurological:  ?   Mental Status: He is alert and oriented to person, place, and time.  ? ? ? ? ?Assessment & Plan:  ? ?Roy [name] was seen today for knee pain. ? ?Diagnoses and all orders for this visit: ? ?Prediabetes ?-     Bayer DCA Hb A1c Waived ?-     CMP14+EGFR ? ?Other orders ?-     Semaglutide-Weight Management (WEGOVY) 0.5 MG/0.5ML SOAJ; Inject 0.5 mg into the skin once a week. ? ? ? ? ? ? ?I am having Roy [name]. Roy [name] start on Roy [name]. I am also having him maintain his acetaminophen, sildenafil, linaclotide, albuterol,  QUEtiapine, levothyroxine, ALPRAZolam, carvedilol, dexlansoprazole, traZODone, rosuvastatin, Vitamin D (Ergocalciferol), DULoxetine, vitamin C, zinc gluconate, meloxicam, gemfibrozil, Symbicort, and levofloxacin. ? ?Allergies as of 01/01/2022   ? ?   Reactions  ? Penicillins Rash, Other (See Comments)  ? Has patient had a PCN reaction causing immediate rash, facial/tongue/throat swelling, SOB or lightheadedness with hypotension: Yes ?Has patient had a PCN reaction causing severe rash involving mucus membranes or skin necrosis: No ?Has patient had a PCN reaction that required hospitalization: No ?Has patient had a PCN reaction occurring within the last 10 years: No ?If all of the above answers are "NO", then may proceed with Cephalosporin use. ?Has patient had a PCN reaction causing immediate rash, facial/tongue/throat swelling, SOB or lightheadedness with hypotension: Yes ?Has patient had a PCN reaction causing severe rash involving mucus membranes or skin necrosis: No ?Has patient had a PCN reaction that required hospitalization: No ?Has patient had a PCN reaction occurring within the last 10 years: No ?If all of the above answers are "NO", then may proceed with Cephalosporin use. ?unknown  ? Chantix [varenicline] Other (See Comments)  ? Severe headache  ? ?  ? ?  ?Medication List  ?  ? ?  ? Accurate as of January 01, 2022  8:38 PM. If you have any questions, ask your nurse or doctor.  ?  ?  ? ?  ? ?acetaminophen 500 MG tablet ?Commonly known as: TYLENOL ?Take 1,000 mg by mouth every 6 (six) hours as needed for headache (pain). ?  ?albuterol 108 (90 Base) MCG/ACT inhaler ?Commonly known as: VENTOLIN HFA ?Inhale 1-2 puffs into the lungs every 6 (six) hours as needed for wheezing or shortness of breath. ?  ?ALPRAZolam 0.5 MG tablet ?Commonly known as: Duanne Moron ?Take 1 tablet (0.5 mg total) by mouth 3 (three) times daily as needed. for anxiety ?  ?carvedilol 6.25 MG tablet ?Commonly known as: COREG ?Take 1 tablet (6.25 mg  total) by mouth 2 (two) times daily with a meal. ?  ?dexlansoprazole 60 MG capsule ?Commonly known as: DEXILANT ?Take 1 capsule (60 mg total) by mouth daily. ?  ?DULoxetine 60 MG capsule ?Commonly known as: CYMBALTA ?Take 2 capsules by mouth once daily ?  ?gemfibrozil 600 MG tablet ?Commonly known as: LOPID ?Take 1 tablet by mouth twice daily ?  ?levofloxacin 500 MG tablet ?Commonly known as: LEVAQUIN ?Take 1 tablet (500 mg total) by mouth daily. For 10 days ?  ?levothyroxine 50 MCG tablet ?Commonly known as: SYNTHROID ?Take 1 tablet by mouth once daily ?  ?linaclotide 290 MCG Caps capsule ?  Commonly known as: LINZESS ?Take 1 capsule (290 mcg total) by mouth daily as needed (constipation). ?  ?meloxicam 7.5 MG tablet ?Commonly known as: Mobic ?Take 1 tablet (7.5 mg total) by mouth daily. ?  ?QUEtiapine 200 MG tablet ?Commonly known as: SEROQUEL ?TAKE 1 TABLET BY MOUTH AT BEDTIME FOR NERVES ?  ?rosuvastatin 5 MG tablet ?Commonly known as: Crestor ?Take 1 tablet (5 mg total) by mouth daily. For cholesterol ?  ?sildenafil 20 MG tablet ?Commonly known as: REVATIO ?Take 2-5 tablets daily as needed for sex ?  ?Symbicort 160-4.5 MCG/ACT inhaler ?Generic drug: budesonide-formoterol ?INHALE 2 PUFFS BY MOUTH TWICE DAILY FOR ASTHMA ?  ?traZODone 100 MG tablet ?Commonly known as: DESYREL ?Take 1 tablet (100 mg total) by mouth at bedtime. ?  ?vitamin C 1000 MG tablet ?Take 1,000 mg by mouth daily. ?  ?Vitamin D (Ergocalciferol) 1.25 MG (50000 UNIT) Caps capsule ?Commonly known as: DRISDOL ?Take 1 capsule (50,000 Units total) by mouth every 7 (seven) days. ?What changed: when to take this ?  ?Wegovy 0.5 MG/0.5ML Soaj ?Generic drug: Semaglutide-Weight Management ?Inject 0.5 mg into the skin once a week. ?Started by: Claretta Fraise, MD ?  ?zinc gluconate 50 MG tablet ?Take 50 mg by mouth daily. ?  ? ?  ? ? ? ?Follow-up: Return in about 1 month (around 01/31/2022). ? ?Claretta Fraise, M.D. ?

## 2022-01-02 ENCOUNTER — Other Ambulatory Visit: Payer: Self-pay | Admitting: Family Medicine

## 2022-01-02 DIAGNOSIS — E039 Hypothyroidism, unspecified: Secondary | ICD-10-CM

## 2022-01-02 LAB — BAYER DCA HB A1C WAIVED: HB A1C (BAYER DCA - WAIVED): 6.3 % — ABNORMAL HIGH (ref 4.8–5.6)

## 2022-01-02 LAB — CMP14+EGFR
ALT: 11 IU/L (ref 0–44)
AST: 11 IU/L (ref 0–40)
Albumin/Globulin Ratio: 2.1 (ref 1.2–2.2)
Albumin: 4.8 g/dL (ref 4.0–5.0)
Alkaline Phosphatase: 108 IU/L (ref 44–121)
BUN/Creatinine Ratio: 11 (ref 9–20)
BUN: 8 mg/dL (ref 6–24)
Bilirubin Total: 0.2 mg/dL (ref 0.0–1.2)
CO2: 25 mmol/L (ref 20–29)
Calcium: 9.5 mg/dL (ref 8.7–10.2)
Chloride: 101 mmol/L (ref 96–106)
Creatinine, Ser: 0.73 mg/dL — ABNORMAL LOW (ref 0.76–1.27)
Globulin, Total: 2.3 g/dL (ref 1.5–4.5)
Glucose: 157 mg/dL — ABNORMAL HIGH (ref 70–99)
Potassium: 4.5 mmol/L (ref 3.5–5.2)
Sodium: 142 mmol/L (ref 134–144)
Total Protein: 7.1 g/dL (ref 6.0–8.5)
eGFR: 112 mL/min/{1.73_m2} (ref >59)

## 2022-02-04 ENCOUNTER — Ambulatory Visit: Payer: Medicare HMO | Admitting: Orthopedic Surgery

## 2022-02-04 VITALS — Wt 345.8 lb

## 2022-02-04 DIAGNOSIS — G8929 Other chronic pain: Secondary | ICD-10-CM

## 2022-02-04 DIAGNOSIS — Z6841 Body Mass Index (BMI) 40.0 and over, adult: Secondary | ICD-10-CM | POA: Diagnosis not present

## 2022-02-04 DIAGNOSIS — Z9889 Other specified postprocedural states: Secondary | ICD-10-CM | POA: Diagnosis not present

## 2022-02-04 DIAGNOSIS — M1712 Unilateral primary osteoarthritis, left knee: Secondary | ICD-10-CM

## 2022-02-04 MED ORDER — DICLOFENAC SODIUM 75 MG PO TBEC: 75.0000 mg | DELAYED_RELEASE_TABLET | Freq: Two times a day (BID) | ORAL | 2 refills | Status: DC

## 2022-02-04 NOTE — Progress Notes (Signed)
FOLLOW UP  ? ?Encounter Diagnoses  ?Name Primary?  ? Chronic pain of left knee Yes  ? Primary osteoarthritis of left knee   ? S/P left knee arthroscopy 10/09/21   ? Morbid obesity ([place])   ? ? ? ?Chief Complaint  ?Patient presents with  ? Knee Pain  ?  Left, still having pain (3 out of 10 ) can not wear brace it makes the pain worse, feel weak esp when going up stairs  ? ?Wt (!) 345 lb 12.8 oz (156.9 kg)   BMI 52.58 kg/m?  ? ?51 year old male status post arthroscopy left knee, as arthritis grade 4 tibial plateau with other chondral changes.  His weight is still 345 BMI is 52 ? ?Not a candidate for further surgery ? ?Findings at surgery ? ?Medial compartment: Grade 4 chondral lesion tibial plateau proximal mid tibia, complex tear posterior horn medial meniscus extending into the body grade 2 chondral lesion medial femoral condyle ? ?Notch normal ACL and PCL with a spur tibial spine and lateral notch ? ?Lateral compartment grade I chondromalacia lateral femoral condyle grade II chondromalacia lateral tibial plateau small free edge tear anterior horn lateral meniscus ? ?Patellofemoral joint diffuse grade II chondromalacia ? ?Moderate synovitis throughout the joint ? ? ?Recommend continue weight loss ?See nutritionist ?Use a cane as needed ?Strengthen the knee with exercises ?Return when his weight is appropriate ? ?Meds ordered this encounter  ?Medications  ? diclofenac (VOLTAREN) 75 MG EC tablet  ?  Sig: Take 1 tablet (75 mg total) by mouth 2 (two) times daily with a meal.  ?  Dispense:  60 tablet  ?  Refill:  2  ? ? ? ?

## 2022-02-04 NOTE — Patient Instructions (Signed)
You should continue to try to make your knee stronger with exercises ? ?We have set up a visit with a nutritionist to work on weight loss ? ?Use a cane as needed ? ?Return when your weight is under 300 pounds ?

## 2022-02-05 ENCOUNTER — Ambulatory Visit (INDEPENDENT_AMBULATORY_CARE_PROVIDER_SITE_OTHER): Payer: Medicare HMO | Admitting: Family Medicine

## 2022-02-05 ENCOUNTER — Encounter: Payer: Self-pay | Admitting: Family Medicine

## 2022-02-05 VITALS — BP 130/74 | HR 76 | Temp 97.8°F | Ht 68.0 in | Wt 346.6 lb

## 2022-02-05 DIAGNOSIS — J329 Chronic sinusitis, unspecified: Secondary | ICD-10-CM

## 2022-02-05 DIAGNOSIS — J4521 Mild intermittent asthma with (acute) exacerbation: Secondary | ICD-10-CM

## 2022-02-05 DIAGNOSIS — R7303 Prediabetes: Secondary | ICD-10-CM | POA: Diagnosis not present

## 2022-02-05 DIAGNOSIS — J40 Bronchitis, not specified as acute or chronic: Secondary | ICD-10-CM

## 2022-02-05 MED ORDER — TRULICITY 0.75 MG/0.5ML ~~LOC~~ SOAJ: 1.5000 mg | SUBCUTANEOUS | 2 refills | Status: DC

## 2022-02-05 MED ORDER — FLUTICASONE PROPIONATE 50 MCG/ACT NA SUSP: 2.0000 | Freq: Every day | NASAL | 6 refills | Status: DC

## 2022-02-05 MED ORDER — ALBUTEROL SULFATE HFA 108 (90 BASE) MCG/ACT IN AERS: 1.0000 | INHALATION_SPRAY | Freq: Four times a day (QID) | RESPIRATORY_TRACT | 5 refills | Status: DC | PRN

## 2022-02-05 MED ORDER — LEVOFLOXACIN 500 MG PO TABS
500.0000 mg | ORAL_TABLET | Freq: Every day | ORAL | 0 refills | Status: DC
Start: 2022-02-05 — End: 2022-03-05

## 2022-02-05 NOTE — Progress Notes (Signed)
? ?Subjective:  ?Patient ID: Roy [name], male    DOB: 1970/12/05  Age: 51 y.o. MRN: [id number] ? ?CC: Follow-up and Prediabetes ? ? ?HPI ?Roy [name] presents for Patient presents with upper respiratory congestion.There is moderate sore throat. Patient reports coughing frequently as well.  Scant, but green sputum noted. Wheezing too. Intermittent dyspnea. There is no fever, chills or sweats.Onset was 5 days ago. Gradually worsening. Tried OTCs without improvement. ?Chest aches - substernally.  ? ?Dx with prediabetes. Wegovy not covered. Needs to lose to below 300 to have surgery on left knee. Still having pain that makes it difficult to exercise.  ? ? ?  02/05/2022  ?  1:39 PM 02/05/2022  ?  1:29 PM 01/01/2022  ?  3:39 PM  ?Depression screen PHQ 2/9  ?Decreased Interest 1 0 1  ?Down, Depressed, Hopeless 1 0 0  ?PHQ - 2 Score 2 0 1  ?Altered sleeping 1  1  ?Tired, decreased energy 1  2  ?Change in appetite 0  1  ?Feeling bad or failure about yourself  0  1  ?Trouble concentrating 0  0  ?Moving slowly or fidgety/restless 0  0  ?Suicidal thoughts 0  0  ?PHQ-9 Score 4  6  ?Difficult doing work/chores Not difficult at all  Not difficult at all  ? ? 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  ? ?He has a past surgical history that includes Rotator cuff repair (Right); Biceps tendon repair (Right); Neck surgery; and Knee arthroscopy with lateral menisectomy (Left, 10/09/2021).  ? ?His family history includes Arrhythmia in his father; Asthma in his brother and daughter; Bipolar disorder in his brother; COPD in his father; Fibromyalgia in his sister; Hypertension in his mother; Stroke (age of onset: 67) in his mother.He reports that he has been smoking cigarettes. He has a 15.00  pack-year smoking history. He quit smokeless tobacco use about 2 years ago.  His smokeless tobacco use included snuff and chew. He reports that he does not drink alcohol and does not use drugs. ? ? ? ?ROS ?Review of Systems  ?Constitutional:  Negative for fever.  ?Respiratory:  Negative for shortness of breath.   ?Cardiovascular:  Negative for chest pain.  ?Musculoskeletal:  Positive for arthralgias.  ?Skin:  Negative for rash.  ? ?Objective:  ?BP 130/74   Pulse 76   Temp 97.8 ?F (36.6 ?C)   Ht 5\' 8"  (1.727 m)   Wt (!) 346 lb 9.6 oz (157.2 kg)   SpO2 96%   BMI 52.70 kg/m?  ? ?BP Readings from Last 3 Encounters:  ?02/05/22 130/74  ?01/01/22 136/85  ?12/27/21 (!) 111/42  ? ? ?Wt Readings from Last 3 Encounters:  ?02/05/22 (!) 346 lb 9.6 oz (157.2 kg)  ?02/04/22 (!) 345 lb 12.8 oz (156.9 kg)  ?01/01/22 (!) 341 lb 12.8 oz (155 kg)  ? ? ? ?Physical Exam ?Vitals reviewed.  ?Constitutional:   ?   Appearance: He is well-developed. He is obese.  ?HENT:  ?   Head: Normocephalic and atraumatic.  ?   Right Ear: External ear normal.  ?   Left Ear: External ear normal.  ?   Mouth/Throat:  ?   Pharynx: No oropharyngeal exudate or posterior oropharyngeal erythema.  ?Eyes:  ?   Pupils: Pupils are equal, round, and  reactive to light.  ?Cardiovascular:  ?   Rate and Rhythm: Normal rate and regular rhythm.  ?   Heart sounds: No murmur heard. ?Pulmonary:  ?   Effort: No respiratory distress.  ?   Breath sounds: Wheezing present.  ?Musculoskeletal:  ?   Cervical back: Normal range of motion and neck supple.  ?Neurological:  ?   Mental Status: He is alert and oriented to person, place, and time.  ? ? ? ? ?Assessment & Plan:  ? ?Marino was seen today for follow-up and prediabetes. ? ?Diagnoses and all orders for this visit: ? ?Mild intermittent asthma with acute exacerbation ? ?Sinobronchitis ?-     albuterol (VENTOLIN HFA) 108 (90 Base) MCG/ACT inhaler; Inhale 1-2 puffs into the lungs every 6 (six) hours as needed for wheezing or  shortness of breath. ? ?Morbid obesity ([place]) ? ?Prediabetes ? ?Other orders ?-     Dulaglutide (TRULICITY) A999333 0000000 SOPN; Inject 1.5 mg into the skin once a week. ?-     levofloxacin (LEVAQUIN) 500 MG tablet; Take 1 tablet (500 mg total) by mouth daily. ?-     fluticasone (FLONASE) 50 MCG/ACT nasal spray; Place 2 sprays into both nostrils daily. ? ? ? ? ? ? ?I have discontinued Zaydn Mccants. Landers's levofloxacin and Wegovy. I am also having him start on Trulicity, levofloxacin, and fluticasone. Additionally, I am having him maintain his acetaminophen, sildenafil, linaclotide, QUEtiapine, ALPRAZolam, carvedilol, dexlansoprazole, traZODone, rosuvastatin, Vitamin D (Ergocalciferol), DULoxetine, vitamin C, zinc gluconate, meloxicam, gemfibrozil, Symbicort, levothyroxine, diclofenac, and albuterol. ? ?Allergies as of 02/05/2022   ? ?   Reactions  ? Penicillins Rash, Other (See Comments)  ? Has patient had a PCN reaction causing immediate rash, facial/tongue/throat swelling, SOB or lightheadedness with hypotension: Yes ?Has patient had a PCN reaction causing severe rash involving mucus membranes or skin necrosis: No ?Has patient had a PCN reaction that required hospitalization: No ?Has patient had a PCN reaction occurring within the last 10 years: No ?If all of the above answers are "NO", then may proceed with Cephalosporin use. ?Has patient had a PCN reaction causing immediate rash, facial/tongue/throat swelling, SOB or lightheadedness with hypotension: Yes ?Has patient had a PCN reaction causing severe rash involving mucus membranes or skin necrosis: No ?Has patient had a PCN reaction that required hospitalization: No ?Has patient had a PCN reaction occurring within the last 10 years: No ?If all of the above answers are "NO", then may proceed with Cephalosporin use. ?unknown  ? Chantix [varenicline] Other (See Comments)  ? Severe headache  ? ?  ? ?  ?Medication List  ?  ? ?  ? Accurate as of Feb 05, 2022  2:02 PM. If  you have any questions, ask your nurse or doctor.  ?  ?  ? ?  ? ?STOP taking these medications   ? ?Wegovy 0.5 MG/0.5ML Soaj ?Generic drug: Semaglutide-Weight Management ?Stopped by: Claretta Fraise, MD ?  ? ?  ? ?TAKE these medications   ? ?acetaminophen 500 MG tablet ?Commonly known as: TYLENOL ?Take 1,000 mg by mouth every 6 (six) hours as needed for headache (pain). ?  ?albuterol 108 (90 Base) MCG/ACT inhaler ?Commonly known as: VENTOLIN HFA ?Inhale 1-2 puffs into the lungs every 6 (six) hours as needed for wheezing or shortness of breath. ?  ?ALPRAZolam 0.5 MG tablet ?Commonly known as: Duanne Moron ?Take 1 tablet (0.5 mg total) by mouth 3 (three) times daily as needed. for anxiety ?  ?carvedilol 6.25 MG tablet ?  Commonly known as: COREG ?Take 1 tablet (6.25 mg total) by mouth 2 (two) times daily with a meal. ?  ?dexlansoprazole 60 MG capsule ?Commonly known as: DEXILANT ?Take 1 capsule (60 mg total) by mouth daily. ?  ?diclofenac 75 MG EC tablet ?Commonly known as: VOLTAREN ?Take 1 tablet (75 mg total) by mouth 2 (two) times daily with a meal. ?  ?DULoxetine 60 MG capsule ?Commonly known as: CYMBALTA ?Take 2 capsules by mouth once daily ?  ?fluticasone 50 MCG/ACT nasal spray ?Commonly known as: FLONASE ?Place 2 sprays into both nostrils daily. ?Started by: Claretta Fraise, MD ?  ?gemfibrozil 600 MG tablet ?Commonly known as: LOPID ?Take 1 tablet by mouth twice daily ?  ?levofloxacin 500 MG tablet ?Commonly known as: LEVAQUIN ?Take 1 tablet (500 mg total) by mouth daily. ?What changed: additional instructions ?Changed by: Claretta Fraise, MD ?  ?levothyroxine 50 MCG tablet ?Commonly known as: SYNTHROID ?Take 1 tablet by mouth once daily ?  ?linaclotide 290 MCG Caps capsule ?Commonly known as: LINZESS ?Take 1 capsule (290 mcg total) by mouth daily as needed (constipation). ?  ?meloxicam 7.5 MG tablet ?Commonly known as: Mobic ?Take 1 tablet (7.5 mg total) by mouth daily. ?  ?QUEtiapine 200 MG tablet ?Commonly known as:  SEROQUEL ?TAKE 1 TABLET BY MOUTH AT BEDTIME FOR NERVES ?  ?rosuvastatin 5 MG tablet ?Commonly known as: Crestor ?Take 1 tablet (5 mg total) by mouth daily. For cholesterol ?  ?sildenafil 20 MG tablet ?Commonly known a

## 2022-02-07 ENCOUNTER — Other Ambulatory Visit: Payer: Self-pay | Admitting: Family Medicine

## 2022-02-08 NOTE — Telephone Encounter (Signed)
Sending back, this was the note from the pharmacy Pharmacy comment: Script Clarification:PLEASE SEND RX FOR TRULICITY 1.5MG .

## 2022-02-08 NOTE — Addendum Note (Signed)
Addended by: Julious Payer D on: 02/08/2022 08:41 AM   Modules accepted: Orders

## 2022-02-10 ENCOUNTER — Other Ambulatory Visit: Payer: Self-pay | Admitting: Family Medicine

## 2022-02-10 MED ORDER — TRULICITY 1.5 MG/0.5ML ~~LOC~~ SOAJ: SUBCUTANEOUS | 3 refills | Status: DC

## 2022-02-11 ENCOUNTER — Other Ambulatory Visit: Payer: Self-pay | Admitting: Family Medicine

## 2022-02-11 DIAGNOSIS — J418 Mixed simple and mucopurulent chronic bronchitis: Secondary | ICD-10-CM

## 2022-02-20 ENCOUNTER — Telehealth: Payer: Self-pay | Admitting: Orthopedic Surgery

## 2022-02-20 NOTE — Telephone Encounter (Signed)
Roy [name],   Patient called at 4:24 pm and left a voicemail to cancel the referral to the nutritionist he has a nutritionist at his doctors office.

## 2022-02-21 NOTE — Telephone Encounter (Signed)
Noted, referral closed.

## 2022-03-05 ENCOUNTER — Encounter: Payer: Self-pay | Admitting: Family Medicine

## 2022-03-05 ENCOUNTER — Ambulatory Visit (INDEPENDENT_AMBULATORY_CARE_PROVIDER_SITE_OTHER): Payer: Medicare HMO | Admitting: Family Medicine

## 2022-03-05 ENCOUNTER — Other Ambulatory Visit: Payer: Self-pay | Admitting: Family Medicine

## 2022-03-05 VITALS — BP 135/76 | HR 83 | Temp 96.9°F | Ht 68.0 in | Wt 344.6 lb

## 2022-03-05 DIAGNOSIS — Z0000 Encounter for general adult medical examination without abnormal findings: Secondary | ICD-10-CM

## 2022-03-05 DIAGNOSIS — Z79899 Other long term (current) drug therapy: Secondary | ICD-10-CM

## 2022-03-05 DIAGNOSIS — Z0001 Encounter for general adult medical examination with abnormal findings: Secondary | ICD-10-CM | POA: Diagnosis not present

## 2022-03-05 DIAGNOSIS — F411 Generalized anxiety disorder: Secondary | ICD-10-CM

## 2022-03-05 DIAGNOSIS — M5416 Radiculopathy, lumbar region: Secondary | ICD-10-CM

## 2022-03-05 DIAGNOSIS — E559 Vitamin D deficiency, unspecified: Secondary | ICD-10-CM | POA: Diagnosis not present

## 2022-03-05 DIAGNOSIS — Z125 Encounter for screening for malignant neoplasm of prostate: Secondary | ICD-10-CM

## 2022-03-05 MED ORDER — DULOXETINE HCL 60 MG PO CPEP: 120.0000 mg | ORAL_CAPSULE | Freq: Every day | ORAL | 3 refills | Status: DC

## 2022-03-05 MED ORDER — ALPRAZOLAM 0.5 MG PO TABS: 0.5000 mg | ORAL_TABLET | Freq: Three times a day (TID) | ORAL | 5 refills | Status: DC | PRN

## 2022-03-05 NOTE — Progress Notes (Signed)
Subjective:  Patient ID: Roy [name], male    DOB: 1971-05-28  Age: 51 y.o. MRN: [id number]  CC: Annual Exam   HPI Roy [name] presents for CPE. Diabetes under control with recent A1c of 6.3. Planning to start trulicity 1.5 today.  Knee pain persists. Managed by Roy [name] of ortho. He wants to do replacement, but can not until pt weighs less than 300 lb. Currently managed with pain control.   Has colonoscopies with Roy [name]. No record available. Pt. Says it has been a while.No record in chart.   Using symbicort as directed. Has albuterol, but hasn't needed it in quite a while     03/05/2022    9:11 AM 03/05/2022    9:03 AM 02/05/2022    1:39 PM  Depression screen PHQ 2/9  Decreased Interest 1 0 1  Down, Depressed, Hopeless 1 0 1  PHQ - 2 Score 2 0 2  Altered sleeping 0  1  Tired, decreased energy 0  1  Change in appetite 0  0  Feeling bad or failure about yourself  0  0  Trouble concentrating 0  0  Moving slowly or fidgety/restless 0  0  Suicidal thoughts 0  0  PHQ-9 Score 2  4  Difficult doing work/chores Not difficult at all  Not difficult at all    History Roy [name] has a past medical history of Anxiety, Asthma, 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   He has a past surgical history that includes Rotator cuff repair (Right); Biceps tendon repair (Right); Neck surgery; and Knee arthroscopy with lateral menisectomy (Left, 10/09/2021).   His family history includes Arrhythmia in his father; Asthma in his brother and daughter; Bipolar disorder in his brother; COPD in his father; Fibromyalgia in his sister; Hypertension in his mother; Stroke (age of onset: 50) in his mother.He reports that he has been smoking cigarettes. He has a 15.00 pack-year smoking history. He quit smokeless tobacco  use about 2 years ago.  His smokeless tobacco use included snuff and chew. He reports that he does not drink alcohol and does not use drugs.    ROS Review of Systems  Constitutional:  Negative for activity change, fatigue and unexpected weight change.  HENT:  Negative for congestion, ear pain, hearing loss, postnasal drip and trouble swallowing.   Eyes:  Negative for pain and visual disturbance.  Respiratory:  Negative for cough, chest tightness and shortness of breath.   Cardiovascular:  Negative for chest pain, palpitations and leg swelling.  Gastrointestinal:  Negative for abdominal distention, abdominal pain, blood in stool, constipation, diarrhea, nausea and vomiting.  Endocrine: Negative for cold intolerance, heat intolerance and polydipsia.  Genitourinary:  Negative for difficulty urinating, dysuria, flank pain, frequency and urgency.  Musculoskeletal:  Positive for arthralgias (knees). Negative for joint swelling.  Skin:  Negative for color change, rash and wound.  Neurological:  Negative for dizziness, syncope, speech difficulty, weakness, light-headedness, numbness and headaches.  Hematological:  Does not bruise/bleed easily.  Psychiatric/Behavioral:  Negative for confusion, decreased concentration, dysphoric mood and sleep disturbance. The patient is not nervous/anxious.     Objective:  BP 135/76   Pulse 83   Temp (!) 96.9 F (36.1 C)   Ht '5\' 8"'  (1.727 m)   Wt (!) 344 lb 9.6 oz (156.3 kg)   SpO2 96%   BMI 52.40 kg/m  BP Readings from Last 3 Encounters:  03/05/22 135/76  02/05/22 130/74  01/01/22 136/85    Wt Readings from Last 3 Encounters:  03/05/22 (!) 344 lb 9.6 oz (156.3 kg)  02/05/22 (!) 346 lb 9.6 oz (157.2 kg)  02/04/22 (!) 345 lb 12.8 oz (156.9 kg)     Physical Exam Constitutional:      Appearance: He is well-developed. He is obese.  HENT:     Head: Normocephalic and atraumatic.  Eyes:     Pupils: Pupils are equal, round, and reactive to light.   Neck:     Thyroid: No thyromegaly.     Trachea: No tracheal deviation.  Cardiovascular:     Rate and Rhythm: Normal rate and regular rhythm.     Heart sounds: Normal heart sounds. No murmur heard.    No friction rub. No gallop.  Pulmonary:     Breath sounds: Normal breath sounds. No wheezing or rales.  Abdominal:     General: Bowel sounds are normal. There is no distension.     Palpations: Abdomen is soft. There is no mass.     Tenderness: There is no abdominal tenderness.     Hernia: There is no hernia in the left inguinal area.  Genitourinary:    Penis: Normal.      Testes: Normal.  Musculoskeletal:        General: Normal range of motion.     Cervical back: Normal range of motion.  Lymphadenopathy:     Cervical: No cervical adenopathy.  Skin:    General: Skin is warm and dry.  Neurological:     Mental Status: He is alert and oriented to person, place, and time.       Assessment & Plan:   Roy [name] was seen today for annual exam.  Diagnoses and all orders for this visit:  Controlled substance agreement signed -     ToxASSURE Select 13 (MW), Urine -     CBC with Differential/Platelet -     CMP14+EGFR  GAD (generalized anxiety disorder) -     ALPRAZolam (XANAX) 0.5 MG tablet; Take 1 tablet (0.5 mg total) by mouth 3 (three) times daily as needed. for anxiety -     DULoxetine (CYMBALTA) 60 MG capsule; Take 2 capsules (120 mg total) by mouth daily. -     CBC with Differential/Platelet -     CMP14+EGFR  Lumbar radiculopathy -     DULoxetine (CYMBALTA) 60 MG capsule; Take 2 capsules (120 mg total) by mouth daily. -     CBC with Differential/Platelet -     CMP14+EGFR  Vitamin D deficiency -     VITAMIN D 25 Hydroxy (Vit-D Deficiency, Fractures)  Well adult exam -     CBC with Differential/Platelet -     CMP14+EGFR  Screening for prostate cancer -     PSA, total and free  Morbid obesity ([place]) -     TSH       I have discontinued Roy [name]. Roy [name]'s  meloxicam and levofloxacin. I have also changed his DULoxetine. Additionally, I am having him maintain his acetaminophen, sildenafil, linaclotide, QUEtiapine, carvedilol, dexlansoprazole, traZODone, rosuvastatin, Vitamin D (Ergocalciferol), vitamin C, zinc gluconate, levothyroxine, diclofenac, albuterol, fluticasone, Trulicity, gemfibrozil, Symbicort, and ALPRAZolam.  Allergies as of 03/05/2022       Reactions   Penicillins Rash, Other (See Comments)   Has patient had a PCN reaction causing immediate rash, facial/tongue/throat swelling, SOB or lightheadedness with hypotension: Yes Has patient had a PCN reaction  causing severe rash involving mucus membranes or skin necrosis: No Has patient had a PCN reaction that required hospitalization: No Has patient had a PCN reaction occurring within the last 10 years: No If all of the above answers are "NO", then may proceed with Cephalosporin use. Has patient had a PCN reaction causing immediate rash, facial/tongue/throat swelling, SOB or lightheadedness with hypotension: Yes Has patient had a PCN reaction causing severe rash involving mucus membranes or skin necrosis: No Has patient had a PCN reaction that required hospitalization: No Has patient had a PCN reaction occurring within the last 10 years: No If all of the above answers are "NO", then may proceed with Cephalosporin use. unknown   Chantix [varenicline] Other (See Comments)   Severe headache        Medication List        Accurate as of March 05, 2022  9:59 AM. If you have any questions, ask your nurse or doctor.          STOP taking these medications    levofloxacin 500 MG tablet Commonly known as: LEVAQUIN Stopped by: Claretta Fraise, MD   meloxicam 7.5 MG tablet Commonly known as: Mobic Stopped by: Claretta Fraise, MD       TAKE these medications    acetaminophen 500 MG tablet Commonly known as: TYLENOL Take 1,000 mg by mouth every 6 (six) hours as needed for headache  (pain).   albuterol 108 (90 Base) MCG/ACT inhaler Commonly known as: VENTOLIN HFA Inhale 1-2 puffs into the lungs every 6 (six) hours as needed for wheezing or shortness of breath.   ALPRAZolam 0.5 MG tablet Commonly known as: XANAX Take 1 tablet (0.5 mg total) by mouth 3 (three) times daily as needed. for anxiety   carvedilol 6.25 MG tablet Commonly known as: COREG Take 1 tablet (6.25 mg total) by mouth 2 (two) times daily with a meal.   dexlansoprazole 60 MG capsule Commonly known as: DEXILANT Take 1 capsule (60 mg total) by mouth daily.   diclofenac 75 MG EC tablet Commonly known as: VOLTAREN Take 1 tablet (75 mg total) by mouth 2 (two) times daily with a meal.   DULoxetine 60 MG capsule Commonly known as: CYMBALTA Take 2 capsules (120 mg total) by mouth daily.   fluticasone 50 MCG/ACT nasal spray Commonly known as: FLONASE Place 2 sprays into both nostrils daily.   gemfibrozil 600 MG tablet Commonly known as: LOPID Take 1 tablet by mouth twice daily   levothyroxine 50 MCG tablet Commonly known as: SYNTHROID Take 1 tablet by mouth once daily   linaclotide 290 MCG Caps capsule Commonly known as: LINZESS Take 1 capsule (290 mcg total) by mouth daily as needed (constipation).   QUEtiapine 200 MG tablet Commonly known as: SEROQUEL TAKE 1 TABLET BY MOUTH AT BEDTIME FOR NERVES   rosuvastatin 5 MG tablet Commonly known as: Crestor Take 1 tablet (5 mg total) by mouth daily. For cholesterol   sildenafil 20 MG tablet Commonly known as: REVATIO Take 2-5 tablets daily as needed for sex   Symbicort 160-4.5 MCG/ACT inhaler Generic drug: budesonide-formoterol INHALE 2 PUFFS BY MOUTH TWICE DAILY FOR ASTHMA   traZODone 100 MG tablet Commonly known as: DESYREL Take 1 tablet (100 mg total) by mouth at bedtime.   Trulicity 1.5 ZY/6.0YT Sopn Generic drug: Dulaglutide Inject content of one pen under the skin weekly   vitamin C 1000 MG tablet Take 1,000 mg by mouth  daily.   Vitamin D (Ergocalciferol) 1.25 MG (50000  UNIT) Caps capsule Commonly known as: DRISDOL Take 1 capsule (50,000 Units total) by mouth every 7 (seven) days. What changed: when to take this   zinc gluconate 50 MG tablet Take 50 mg by mouth daily.         Follow-up: Return in about 3 months (around 06/05/2022).  Claretta Fraise, M.D.

## 2022-03-06 LAB — CMP14+EGFR
ALT: 16 IU/L (ref 0–44)
AST: 15 IU/L (ref 0–40)
Albumin/Globulin Ratio: 2 (ref 1.2–2.2)
Albumin: 4.7 g/dL (ref 4.0–5.0)
Alkaline Phosphatase: 94 IU/L (ref 44–121)
BUN/Creatinine Ratio: 13 (ref 9–20)
BUN: 10 mg/dL (ref 6–24)
Bilirubin Total: 0.3 mg/dL (ref 0.0–1.2)
CO2: 23 mmol/L (ref 20–29)
Calcium: 9.7 mg/dL (ref 8.7–10.2)
Chloride: 99 mmol/L (ref 96–106)
Creatinine, Ser: 0.76 mg/dL (ref 0.76–1.27)
Globulin, Total: 2.4 g/dL (ref 1.5–4.5)
Glucose: 124 mg/dL — ABNORMAL HIGH (ref 70–99)
Potassium: 4.7 mmol/L (ref 3.5–5.2)
Sodium: 138 mmol/L (ref 134–144)
Total Protein: 7.1 g/dL (ref 6.0–8.5)
eGFR: 111 mL/min/{1.73_m2} (ref >59)

## 2022-03-06 LAB — CBC WITH DIFFERENTIAL/PLATELET
Basophils Absolute: 0 10*3/uL (ref 0.0–0.2)
Basos: 1 %
EOS (ABSOLUTE): 0.1 10*3/uL (ref 0.0–0.4)
Eos: 1 %
Hematocrit: 41 % (ref 37.5–51.0)
Hemoglobin: 14 g/dL (ref 13.0–17.7)
Immature Grans (Abs): 0 10*3/uL (ref 0.0–0.1)
Immature Granulocytes: 0 %
Lymphocytes Absolute: 2 10*3/uL (ref 0.7–3.1)
Lymphs: 24 %
MCH: 30.8 pg (ref 26.6–33.0)
MCHC: 34.1 g/dL (ref 31.5–35.7)
MCV: 90 fL (ref 79–97)
Monocytes Absolute: 0.5 10*3/uL (ref 0.1–0.9)
Monocytes: 6 %
Neutrophils Absolute: 5.7 10*3/uL (ref 1.4–7.0)
Neutrophils: 68 %
Platelets: 248 10*3/uL (ref 150–450)
RBC: 4.55 x10E6/uL (ref 4.14–5.80)
RDW: 13.1 % (ref 11.6–15.4)
WBC: 8.3 10*3/uL (ref 3.4–10.8)

## 2022-03-06 LAB — VITAMIN D 25 HYDROXY (VIT D DEFICIENCY, FRACTURES): Vit D, 25-Hydroxy: 37.3 ng/mL (ref 30.0–100.0)

## 2022-03-06 LAB — TSH: TSH: 3.23 u[IU]/mL (ref 0.450–4.500)

## 2022-03-06 LAB — PSA, TOTAL AND FREE
PSA, Free Pct: 18 %
PSA, Free: 0.09 ng/mL
Prostate Specific Ag, Serum: 0.5 ng/mL (ref 0.0–4.0)

## 2022-03-06 NOTE — Progress Notes (Signed)
Hello Seeley,  Your lab result is normal and/or stable.Some minor variations that are not significant are commonly marked abnormal, but do not represent any medical problem for you.  Best regards, Alee Gressman, M.D.

## 2022-03-07 ENCOUNTER — Other Ambulatory Visit: Payer: Self-pay | Admitting: Family Medicine

## 2022-03-07 DIAGNOSIS — J418 Mixed simple and mucopurulent chronic bronchitis: Secondary | ICD-10-CM

## 2022-03-09 LAB — TOXASSURE SELECT 13 (MW), URINE

## 2022-04-01 ENCOUNTER — Ambulatory Visit: Payer: Self-pay | Admitting: *Deleted

## 2022-04-01 DIAGNOSIS — F411 Generalized anxiety disorder: Secondary | ICD-10-CM

## 2022-04-01 NOTE — Chronic Care Management (AMB) (Signed)
  Chronic Care Management   Note  04/01/2022 Name: Roy [name] MRN: [id number] DOB: 1971/05/28    Patient has not recently engaged with the Chronic Care Management RN Care Manager. Removing RN Care Manager from Care Team and closing RN Care Management Care Plans. If patient is currently engaged with another CCM team member I will forward this encounter to inform them of my case closure. Patient may be eligible for re-engagement with RN Care Manager in the future if necessary and can discuss this with their PCP.  Demetrios Loll, BSN, RN-BC Embedded Chronic Care Manager [facility] Care Management Direct Dial: [phone]

## 2022-04-03 ENCOUNTER — Other Ambulatory Visit: Payer: Self-pay | Admitting: Family Medicine

## 2022-04-03 DIAGNOSIS — F411 Generalized anxiety disorder: Secondary | ICD-10-CM

## 2022-04-03 DIAGNOSIS — K219 Gastro-esophageal reflux disease without esophagitis: Secondary | ICD-10-CM

## 2022-04-15 ENCOUNTER — Telehealth: Payer: Self-pay

## 2022-04-15 NOTE — Telephone Encounter (Signed)
Reassure him that is a common side effect. Start taking colace 100 mg BID

## 2022-04-15 NOTE — Telephone Encounter (Signed)
Patient reports constipation since starting Trulicity.  He was able to have a small bowel movement with  mag citrate but other than that has not had anything and would like to know what you recommend he do.

## 2022-04-15 NOTE — Telephone Encounter (Signed)
Patient aware.

## 2022-04-21 IMAGING — DX DG CHEST 2V
2 series · 3 of 3 positions shown · non-contrast
Comparison: 07/02/2021

CLINICAL DATA: Cough

EXAM:
CHEST - 2 VIEW

[chest lat]
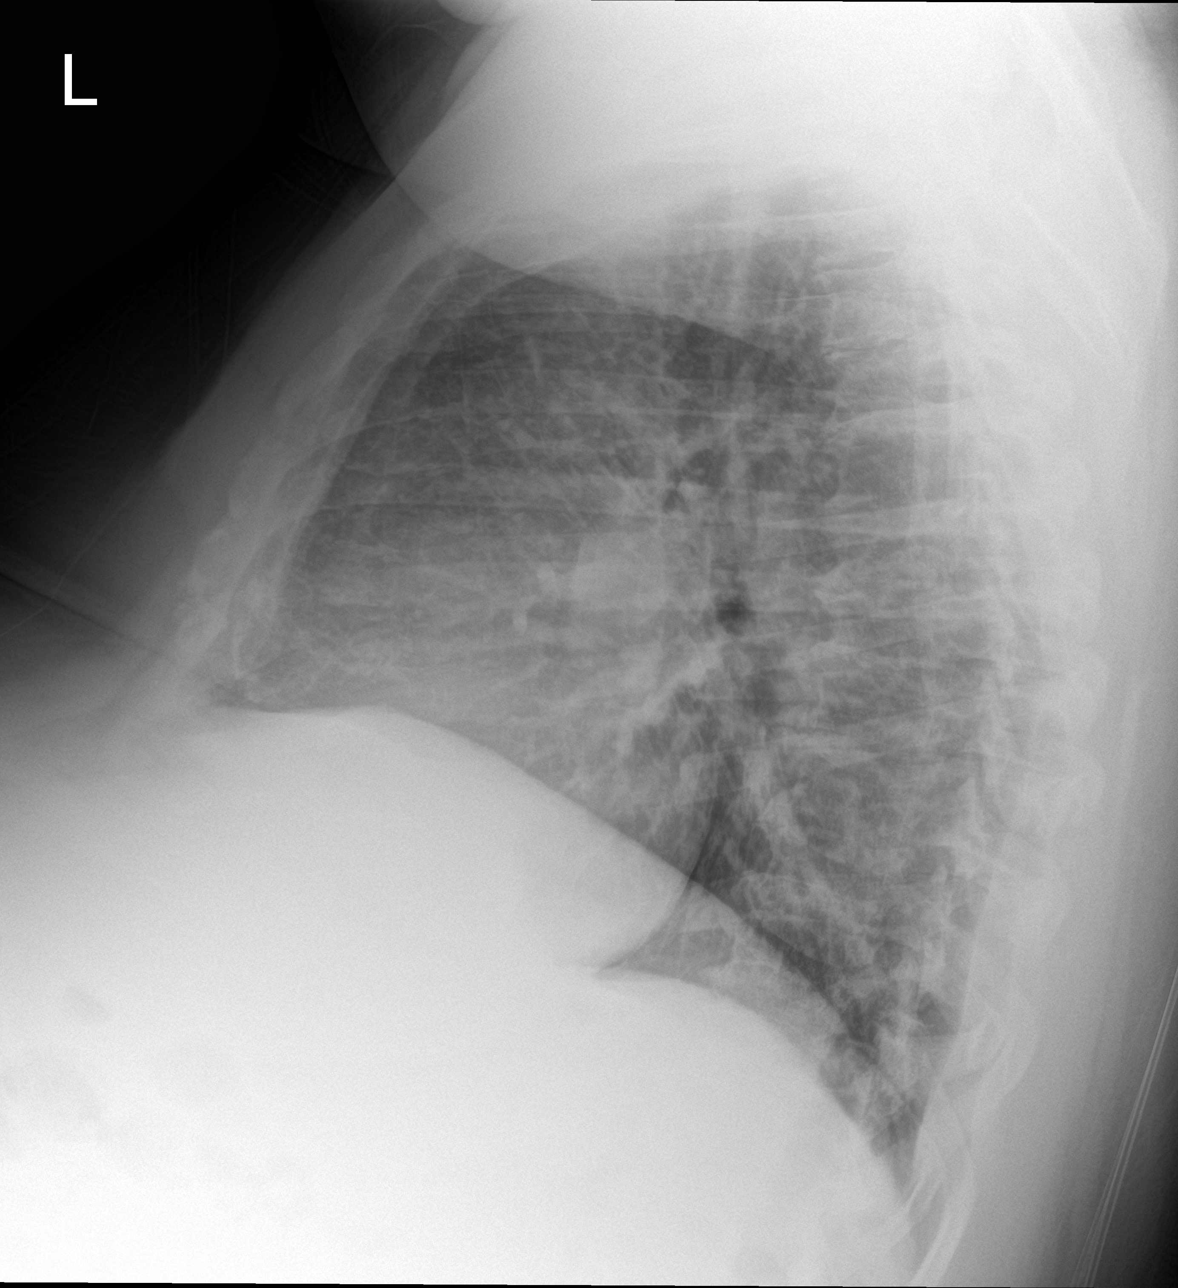

[Series 3: chest ap · 0.14mm/px · 2 of 2 slices shown]
[im 1/2]
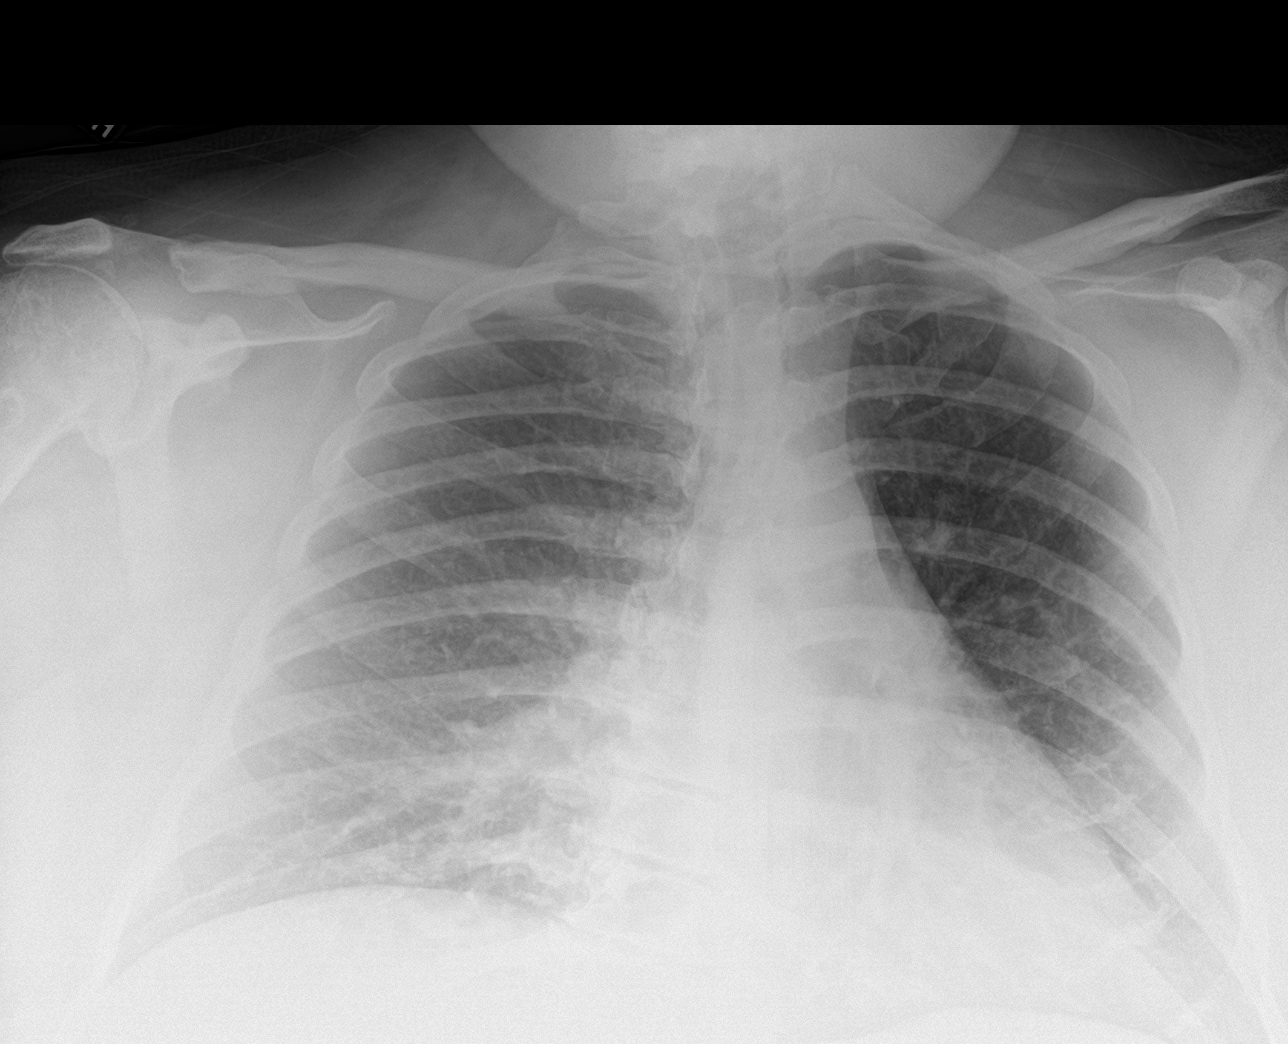
[im 2/2]
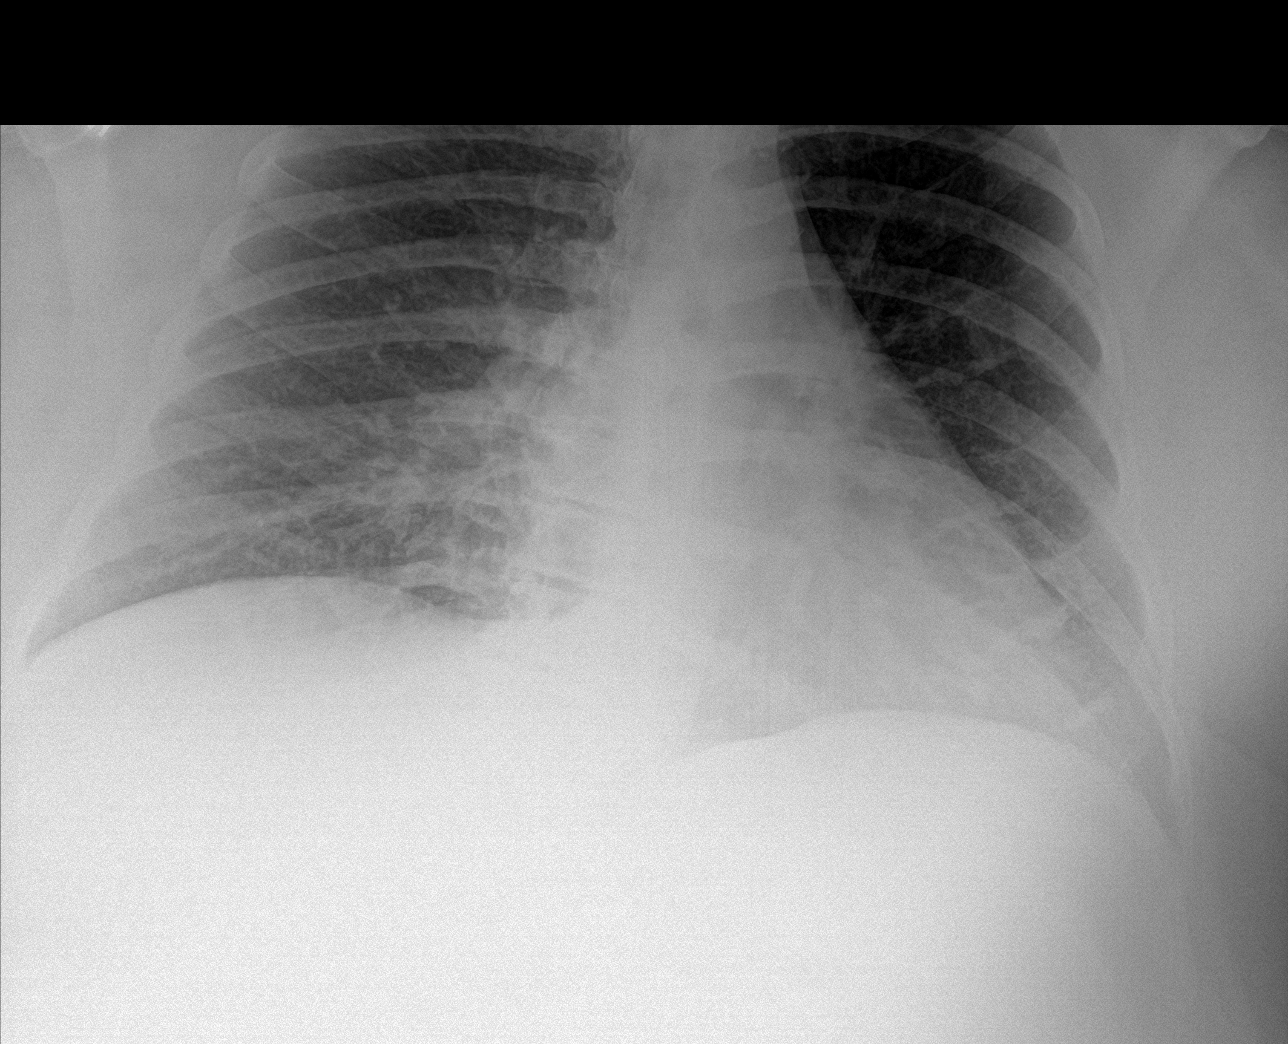

[3 of 3 positions shown; findings below may reference images not displayed]

FINDINGS: Heart and mediastinal contours are within normal limits. No focal
opacities or effusions. No acute bony abnormality.
IMPRESSION: No active cardiopulmonary disease.

## 2022-05-06 ENCOUNTER — Other Ambulatory Visit: Payer: Self-pay | Admitting: Family Medicine

## 2022-06-05 ENCOUNTER — Encounter: Payer: Self-pay | Admitting: Family Medicine

## 2022-06-05 ENCOUNTER — Ambulatory Visit (INDEPENDENT_AMBULATORY_CARE_PROVIDER_SITE_OTHER): Payer: Medicare HMO | Admitting: Family Medicine

## 2022-06-05 VITALS — BP 126/78 | HR 67 | Temp 97.6°F | Ht 68.0 in | Wt 337.8 lb

## 2022-06-05 DIAGNOSIS — E782 Mixed hyperlipidemia: Secondary | ICD-10-CM | POA: Diagnosis not present

## 2022-06-05 DIAGNOSIS — R7303 Prediabetes: Secondary | ICD-10-CM | POA: Diagnosis not present

## 2022-06-05 DIAGNOSIS — E039 Hypothyroidism, unspecified: Secondary | ICD-10-CM | POA: Diagnosis not present

## 2022-06-05 DIAGNOSIS — I10 Essential (primary) hypertension: Secondary | ICD-10-CM

## 2022-06-05 DIAGNOSIS — E038 Other specified hypothyroidism: Secondary | ICD-10-CM

## 2022-06-05 DIAGNOSIS — F411 Generalized anxiety disorder: Secondary | ICD-10-CM

## 2022-06-05 DIAGNOSIS — E349 Endocrine disorder, unspecified: Secondary | ICD-10-CM | POA: Diagnosis not present

## 2022-06-05 DIAGNOSIS — K219 Gastro-esophageal reflux disease without esophagitis: Secondary | ICD-10-CM | POA: Diagnosis not present

## 2022-06-05 LAB — BAYER DCA HB A1C WAIVED: HB A1C (BAYER DCA - WAIVED): 6.1 % — ABNORMAL HIGH (ref 4.8–5.6)

## 2022-06-05 MED ORDER — LINACLOTIDE 290 MCG PO CAPS: 290.0000 ug | ORAL_CAPSULE | Freq: Every day | ORAL | 3 refills | Status: DC | PRN

## 2022-06-05 MED ORDER — LEVOTHYROXINE SODIUM 50 MCG PO TABS: 50.0000 ug | ORAL_TABLET | Freq: Every day | ORAL | 3 refills | Status: DC

## 2022-06-05 MED ORDER — GEMFIBROZIL 600 MG PO TABS: 600.0000 mg | ORAL_TABLET | Freq: Two times a day (BID) | ORAL | 3 refills | Status: DC

## 2022-06-05 MED ORDER — DEXILANT 60 MG PO CPDR: 1.0000 | DELAYED_RELEASE_CAPSULE | Freq: Every day | ORAL | 3 refills | Status: DC

## 2022-06-05 MED ORDER — QUETIAPINE FUMARATE 200 MG PO TABS: ORAL_TABLET | ORAL | 3 refills | Status: DC

## 2022-06-05 NOTE — Progress Notes (Signed)
Subjective:  Patient ID: Roy [name],  male    DOB: 1970/12/20  Age: 51 y.o.    CC: Medical Management of Chronic Issues   HPI Roy [name] presents for  follow-up of hypertension. Patient has no history of headache chest pain or shortness of breath or recent cough. Patient also denies symptoms of TIA such as numbness weakness lateralizing. Patient denies side effects from medication. States taking it regularly.  Patient also  in for follow-up of elevated cholesterol. Doing well without complaints on current medication. Denies side effects  including myalgia and arthralgia and nausea. Also in today for liver function testing. Currently no chest pain, shortness of breath or other cardiovascular related symptoms noted.  Follow-up of diabetes. Patient does check blood sugar at home. Patient denies symptoms such as excessive hunger or urinary frequency, excessive hunger, nausea No significant hypoglycemic spells noted. Medications reviewed. Pt reports taking them regularly. Pt. denies complication/adverse reaction today.    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   He has a past surgical history that includes Rotator cuff repair (Right); Biceps tendon repair (Right); Neck surgery; and Knee arthroscopy with lateral menisectomy (Left, 10/09/2021).   His family history includes Arrhythmia in his father; Asthma in his brother and daughter; Bipolar disorder in his brother; COPD in his father; Fibromyalgia in his sister; Hypertension in his mother; Stroke (age of onset: 17) in his mother.He reports that he has been smoking cigarettes. He has a 15.00 pack-year smoking history. He quit smokeless tobacco use about 2 years ago.  His smokeless tobacco use  included snuff and chew. He reports that he does not drink alcohol and does not use drugs.  Current Outpatient Medications on File Prior to Visit  Medication Sig Dispense Refill   acetaminophen (TYLENOL) 500 MG tablet Take 1,000 mg by mouth every 6 (six) hours as needed for headache (pain).      albuterol (VENTOLIN HFA) 108 (90 Base) MCG/ACT inhaler Inhale 1-2 puffs into the lungs every 6 (six) hours as needed for wheezing or shortness of breath. 18 g 5   ALPRAZolam (XANAX) 0.5 MG tablet Take 1 tablet (0.5 mg total) by mouth 3 (three) times daily as needed. for anxiety 90 tablet 5   Ascorbic Acid (VITAMIN C) 1000 MG tablet Take 1,000 mg by mouth daily.     carvedilol (COREG) 6.25 MG tablet Take 1 tablet (6.25 mg total) by mouth 2 (two) times daily with a meal. 180 tablet 2   diclofenac (VOLTAREN) 75 MG EC tablet Take 1 tablet (75 mg total) by mouth 2 (two) times daily with a meal. 60 tablet 2   docusate sodium (COLACE) 100 MG capsule Take 100 mg by mouth daily.     Dulaglutide (TRULICITY) 1.5 WL/7.9GX SOPN Inject content of one pen under the skin weekly 6 mL 3   DULoxetine (CYMBALTA) 60 MG capsule Take 2 capsules (120 mg total) by mouth daily. 180 capsule 3   fluticasone (FLONASE) 50 MCG/ACT nasal spray Place 2 sprays into both nostrils daily. 16 g 6   rosuvastatin (CRESTOR) 5 MG tablet Take 1 tablet (5 mg total) by mouth daily. For cholesterol 90 tablet 3   sildenafil (REVATIO) 20 MG tablet Take 2-5 tablets daily as needed for sex 180 tablet 1   SYMBICORT 160-4.5 MCG/ACT inhaler INHALE  2 PUFFS BY MOUTH TWICE DAILY FOR ASTHMA 11 g 2   traZODone (DESYREL) 100 MG tablet Take 1 tablet (100 mg total) by mouth at bedtime. 90 tablet 3   Vitamin D, Ergocalciferol, (DRISDOL) 1.25 MG (50000 UNIT) CAPS capsule Take 1 capsule (50,000 Units total) by mouth every 7 (seven) days. (Patient taking differently: Take 50,000 Units by mouth every Monday.) 13 capsule 3   zinc gluconate 50 MG tablet Take 50 mg by mouth  daily.     [DISCONTINUED] famotidine (PEPCID) 20 MG tablet Take 1 tablet (20 mg total) by mouth 2 (two) times daily for 14 days. 28 tablet 0   [DISCONTINUED] Testosterone (ANDROGEL PUMP) 20.25 MG/ACT (1.62%) GEL Apply 4 pumps daily to upper chest and shoulders (Patient not taking: Reported on 06/02/2020) 300 g 5   No current facility-administered medications on file prior to visit.    ROS Review of Systems  Constitutional:  Negative for fever.  Respiratory:  Negative for shortness of breath.   Cardiovascular:  Negative for chest pain.  Musculoskeletal:  Negative for arthralgias.  Skin:  Negative for rash.    Objective:  BP 126/78   Pulse 67   Temp 97.6 F (36.4 C)   Ht $R'5\' 8"'DK$  (1.727 m)   Wt (!) 337 lb 12.8 oz (153.2 kg)   SpO2 96%   BMI 51.36 kg/m   BP Readings from Last 3 Encounters:  06/05/22 126/78  03/05/22 135/76  02/05/22 130/74    Wt Readings from Last 3 Encounters:  06/05/22 (!) 337 lb 12.8 oz (153.2 kg)  03/05/22 (!) 344 lb 9.6 oz (156.3 kg)  02/05/22 (!) 346 lb 9.6 oz (157.2 kg)     Physical Exam Vitals reviewed.  Constitutional:      Appearance: He is well-developed.  HENT:     Head: Normocephalic and atraumatic.     Right Ear: External ear normal.     Left Ear: External ear normal.     Mouth/Throat:     Pharynx: No oropharyngeal exudate or posterior oropharyngeal erythema.  Eyes:     Pupils: Pupils are equal, round, and reactive to light.  Cardiovascular:     Rate and Rhythm: Normal rate and regular rhythm.     Heart sounds: No murmur heard. Pulmonary:     Effort: No respiratory distress.     Breath sounds: Normal breath sounds.  Musculoskeletal:     Cervical back: Normal range of motion and neck supple.  Neurological:     Mental Status: He is alert and oriented to person, place, and time.     Diabetic Foot Exam - Simple   No data filed     Lab Results  Component Value Date   HGBA1C 6.3 (H) 01/01/2022   HGBA1C 6.2 (H) 09/14/2021    HGBA1C 5.8% 11/17/2014    Assessment & Plan:   Roy [name] was seen today for medical management of chronic issues.  Diagnoses and all orders for this visit:  Prediabetes -     Bayer DCA Hb A1c Waived  Benign essential HTN -     CBC with Differential/Platelet -     CMP14+EGFR  Other specified hypothyroidism  Mixed hyperlipidemia -     Lipid panel  Gastroesophageal reflux disease, unspecified whether esophagitis present -     DEXILANT 60 MG capsule; Take 1 capsule (60 mg total) by mouth daily.  Hypothyroidism, unspecified type -     levothyroxine (SYNTHROID) 50 MCG tablet; Take 1 tablet (50 mcg total) by mouth  daily. -     TSH + free T4  GAD (generalized anxiety disorder) -     QUEtiapine (SEROQUEL) 200 MG tablet; TAKE 1 TABLET BY MOUTH AT BEDTIME FOR  NERVES  Hypotestosteronism -     Testosterone,Free and Total  Other orders -     gemfibrozil (LOPID) 600 MG tablet; Take 1 tablet (600 mg total) by mouth 2 (two) times daily. -     linaclotide (LINZESS) 290 MCG CAPS capsule; Take 1 capsule (290 mcg total) by mouth daily as needed (constipation).   I have changed [name]. Yonan's Dexilant, gemfibrozil, and levothyroxine. I am also having him maintain his acetaminophen, sildenafil, carvedilol, traZODone, rosuvastatin, Vitamin D (Ergocalciferol), vitamin C, zinc gluconate, diclofenac, albuterol, fluticasone, Trulicity, ALPRAZolam, DULoxetine, Symbicort, linaclotide, QUEtiapine, and docusate sodium.  Meds ordered this encounter  Medications   DEXILANT 60 MG capsule    Sig: Take 1 capsule (60 mg total) by mouth daily.    Dispense:  90 capsule    Refill:  3   gemfibrozil (LOPID) 600 MG tablet    Sig: Take 1 tablet (600 mg total) by mouth 2 (two) times daily.    Dispense:  180 tablet    Refill:  3   levothyroxine (SYNTHROID) 50 MCG tablet    Sig: Take 1 tablet (50 mcg total) by mouth daily.    Dispense:  90 tablet    Refill:  3   linaclotide (LINZESS) 290 MCG CAPS capsule     Sig: Take 1 capsule (290 mcg total) by mouth daily as needed (constipation).    Dispense:  90 capsule    Refill:  3   QUEtiapine (SEROQUEL) 200 MG tablet    Sig: TAKE 1 TABLET BY MOUTH AT BEDTIME FOR  NERVES    Dispense:  90 tablet    Refill:  3     Follow-up: Return in about 3 months (around 09/04/2022).  Claretta Fraise, M.D.

## 2022-06-06 LAB — CMP14+EGFR
ALT: 10 IU/L (ref 0–44)
AST: 12 IU/L (ref 0–40)
Albumin/Globulin Ratio: 2.1 (ref 1.2–2.2)
Albumin: 4.5 g/dL (ref 4.1–5.1)
Alkaline Phosphatase: 92 IU/L (ref 44–121)
BUN/Creatinine Ratio: 6 — ABNORMAL LOW (ref 9–20)
BUN: 5 mg/dL — ABNORMAL LOW (ref 6–24)
Bilirubin Total: 0.2 mg/dL (ref 0.0–1.2)
CO2: 23 mmol/L (ref 20–29)
Calcium: 9.7 mg/dL (ref 8.7–10.2)
Chloride: 100 mmol/L (ref 96–106)
Creatinine, Ser: 0.81 mg/dL (ref 0.76–1.27)
Globulin, Total: 2.1 g/dL (ref 1.5–4.5)
Glucose: 99 mg/dL (ref 70–99)
Potassium: 4.3 mmol/L (ref 3.5–5.2)
Sodium: 138 mmol/L (ref 134–144)
Total Protein: 6.6 g/dL (ref 6.0–8.5)
eGFR: 108 mL/min/{1.73_m2} (ref >59)

## 2022-06-06 LAB — CBC WITH DIFFERENTIAL/PLATELET
Basophils Absolute: 0 10*3/uL (ref 0.0–0.2)
Basos: 1 %
EOS (ABSOLUTE): 0.1 10*3/uL (ref 0.0–0.4)
Eos: 2 %
Hematocrit: 39.2 % (ref 37.5–51.0)
Hemoglobin: 13.5 g/dL (ref 13.0–17.7)
Immature Grans (Abs): 0 10*3/uL (ref 0.0–0.1)
Immature Granulocytes: 0 %
Lymphocytes Absolute: 2.1 10*3/uL (ref 0.7–3.1)
Lymphs: 34 %
MCH: 31 pg (ref 26.6–33.0)
MCHC: 34.4 g/dL (ref 31.5–35.7)
MCV: 90 fL (ref 79–97)
Monocytes Absolute: 0.4 10*3/uL (ref 0.1–0.9)
Monocytes: 6 %
Neutrophils Absolute: 3.5 10*3/uL (ref 1.4–7.0)
Neutrophils: 57 %
Platelets: 239 10*3/uL (ref 150–450)
RBC: 4.35 x10E6/uL (ref 4.14–5.80)
RDW: 13.1 % (ref 11.6–15.4)
WBC: 6.1 10*3/uL (ref 3.4–10.8)

## 2022-06-06 LAB — LIPID PANEL
Chol/HDL Ratio: 3.1 ratio (ref 0.0–5.0)
Cholesterol, Total: 122 mg/dL (ref 100–199)
HDL: 39 mg/dL — ABNORMAL LOW (ref >39)
LDL Chol Calc (NIH): 54 mg/dL (ref 0–99)
Triglycerides: 170 mg/dL — ABNORMAL HIGH (ref 0–149)
VLDL Cholesterol Cal: 29 mg/dL (ref 5–40)

## 2022-06-06 NOTE — Progress Notes (Signed)
Hello Khaleef,  Your lab result is normal and/or stable.Some minor variations that are not significant are commonly marked abnormal, but do not represent any medical problem for you.  Best regards, Avelino Herren, M.D.

## 2022-06-07 ENCOUNTER — Other Ambulatory Visit: Payer: Self-pay | Admitting: Family Medicine

## 2022-06-07 DIAGNOSIS — J418 Mixed simple and mucopurulent chronic bronchitis: Secondary | ICD-10-CM

## 2022-06-10 LAB — TSH+FREE T4
Free T4: 0.98 ng/dL (ref 0.82–1.77)
TSH: 2.69 u[IU]/mL (ref 0.450–4.500)

## 2022-06-10 LAB — TESTOSTERONE,FREE AND TOTAL
Testosterone, Free: 3.8 pg/mL — ABNORMAL LOW (ref 6.8–21.5)
Testosterone: 277 ng/dL (ref 264–916)

## 2022-06-10 LAB — SPECIMEN STATUS REPORT

## 2022-07-03 ENCOUNTER — Other Ambulatory Visit: Payer: Self-pay | Admitting: Family Medicine

## 2022-07-03 DIAGNOSIS — J418 Mixed simple and mucopurulent chronic bronchitis: Secondary | ICD-10-CM

## 2022-07-13 DIAGNOSIS — R059 Cough, unspecified: Secondary | ICD-10-CM | POA: Diagnosis not present

## 2022-07-13 DIAGNOSIS — Z20822 Contact with and (suspected) exposure to covid-19: Secondary | ICD-10-CM | POA: Diagnosis not present

## 2022-07-13 DIAGNOSIS — J209 Acute bronchitis, unspecified: Secondary | ICD-10-CM | POA: Diagnosis not present

## 2022-07-18 ENCOUNTER — Ambulatory Visit (INDEPENDENT_AMBULATORY_CARE_PROVIDER_SITE_OTHER): Payer: Medicare HMO

## 2022-07-18 DIAGNOSIS — Z1211 Encounter for screening for malignant neoplasm of colon: Secondary | ICD-10-CM

## 2022-07-18 DIAGNOSIS — Z0000 Encounter for general adult medical examination without abnormal findings: Secondary | ICD-10-CM

## 2022-07-18 NOTE — Patient Instructions (Signed)
  Mr. Roy [name] , Thank you for taking time to come for your Medicare Wellness Visit. I appreciate your ongoing commitment to your health goals. Please review the following plan we discussed and let me know if I can assist you in the future.   These are the goals we discussed:  Goals      DIET - DECREASE SODA OR JUICE INTAKE     DIET - INCREASE WATER INTAKE     Exercise 3x per week (60 min per time)     Join [employer] and attend classes 3 times per week for 1 hour.      Patient Stated     He wants to lose weight, be healthy and raise his daughter well, and be around when she is grown     Patient Stated     07/18/2022 AWV Goal: Exercise for General Health  Patient will verbalize understanding of the benefits of increased physical activity: Exercising regularly is important. It will improve your overall fitness, flexibility, and endurance. Regular exercise also will improve your overall health. It can help you control your weight, reduce stress, and improve your bone density. Over the next year, patient will increase physical activity as tolerated with a goal of at least 150 minutes of moderate physical activity per week.  You can tell that you are exercising at a moderate intensity if your heart starts beating faster and you start breathing faster but can still hold a conversation. Moderate-intensity exercise ideas include: Walking 1 mile (1.6 km) in about 15 minutes Biking Hiking Golfing Dancing Water aerobics Patient will verbalize understanding of everyday activities that increase physical activity by providing examples like the following: Yard work, such as: Sales promotion account executive Gardening Washing windows or floors Patient will be able to explain general safety guidelines for exercising:  Before you start a new exercise program, talk with your health care provider. Do not exercise so much that  you hurt yourself, feel dizzy, or get very short of breath. Wear comfortable clothes and wear shoes with good support. Drink plenty of water while you exercise to prevent dehydration or heat stroke. Work out until your breathing and your heartbeat get faster.      Plan meals     Plan 3 meals a day and try to eat earlier in the day        This is a list of the screening recommended for you and due dates:  Health Maintenance  Topic Date Due   COVID-19 Vaccine (1) Never done   Colon Cancer Screening  Never done   Medicare Annual Wellness Visit  08/16/2022   Flu Shot  12/22/2022*   Hepatitis C Screening: USPSTF Recommendation to screen - Ages 18-79 yo.  02/06/2023*   Tetanus Vaccine  11/16/2024   HIV Screening  Completed   HPV Vaccine  Aged Out  *Topic was postponed. The date shown is not the original due date.

## 2022-07-18 NOTE — Progress Notes (Signed)
Subjective:   Roy [name] is a 51 y.o. male who presents for Medicare Annual/Subsequent preventive examination.  I connected with  Roy [name] on 07/18/22 by a audio enabled telemedicine application and verified that I am speaking with the correct person using two identifiers.  Patient Location: Home  Provider Location: Office/Clinic  I discussed the limitations of evaluation and management by telemedicine. The patient expressed understanding and agreed to proceed.   Review of Systems    Defer to PCP   Cardiac Risk Factors include: dyslipidemia;hypertension;male gender;sedentary lifestyle;smoking/ tobacco exposure;obesity (BMI >30kg/m2)     Objective:    There were no vitals filed for this visit. There is no height or weight on file to calculate BMI.     07/18/2022    8:21 AM 12/27/2021    5:19 PM 10/17/2021    9:23 PM 10/09/2021    9:04 AM 10/04/2021    9:38 AM 07/17/2021    8:29 AM 01/15/2021   10:50 AM  Advanced Directives  Does Patient Have a Medical Advance Directive? No No No No No No No  Would patient like information on creating a medical advance directive? No - Patient declined No - Patient declined   Yes (MAU/Ambulatory/Procedural Areas - Information given) No - Patient declined No - Patient declined    Current Medications (verified) Outpatient Encounter Medications as of 07/18/2022  Medication Sig   acetaminophen (TYLENOL) 500 MG tablet Take 1,000 mg by mouth every 6 (six) hours as needed for headache (pain).    albuterol (VENTOLIN HFA) 108 (90 Base) MCG/ACT inhaler Inhale 1-2 puffs into the lungs every 6 (six) hours as needed for wheezing or shortness of breath.   ALPRAZolam (XANAX) 0.5 MG tablet Take 1 tablet (0.5 mg total) by mouth 3 (three) times daily as needed. for anxiety   Ascorbic Acid (VITAMIN C) 1000 MG tablet Take 1,000 mg by mouth daily.   carvedilol (COREG) 6.25 MG tablet Take 1 tablet (6.25 mg total) by mouth 2 (two) times daily with a meal.    DEXILANT 60 MG capsule Take 1 capsule (60 mg total) by mouth daily.   diclofenac (VOLTAREN) 75 MG EC tablet Take 1 tablet (75 mg total) by mouth 2 (two) times daily with a meal.   docusate sodium (COLACE) 100 MG capsule Take 100 mg by mouth daily.   Dulaglutide (TRULICITY) 1.5 ZH/0.8MV SOPN Inject content of one pen under the skin weekly   DULoxetine (CYMBALTA) 60 MG capsule Take 2 capsules (120 mg total) by mouth daily.   gemfibrozil (LOPID) 600 MG tablet Take 1 tablet (600 mg total) by mouth 2 (two) times daily.   levothyroxine (SYNTHROID) 50 MCG tablet Take 1 tablet (50 mcg total) by mouth daily.   linaclotide (LINZESS) 290 MCG CAPS capsule Take 1 capsule (290 mcg total) by mouth daily as needed (constipation).   QUEtiapine (SEROQUEL) 200 MG tablet TAKE 1 TABLET BY MOUTH AT BEDTIME FOR  NERVES   rosuvastatin (CRESTOR) 5 MG tablet Take 1 tablet (5 mg total) by mouth daily. For cholesterol   sildenafil (REVATIO) 20 MG tablet Take 2-5 tablets daily as needed for sex   SYMBICORT 160-4.5 MCG/ACT inhaler INHALE 2 PUFFS INTO LUNGS TWICE DAILY FOR ASTHMA   traZODone (DESYREL) 100 MG tablet Take 1 tablet (100 mg total) by mouth at bedtime.   Vitamin D, Ergocalciferol, (DRISDOL) 1.25 MG (50000 UNIT) CAPS capsule Take 1 capsule (50,000 Units total) by mouth every 7 (seven) days. (Patient taking differently: Take 50,000  Units by mouth every Monday.)   zinc gluconate 50 MG tablet Take 50 mg by mouth daily.   fluticasone (FLONASE) 50 MCG/ACT nasal spray Place 2 sprays into both nostrils daily. (Patient not taking: Reported on 07/18/2022)   [DISCONTINUED] famotidine (PEPCID) 20 MG tablet Take 1 tablet (20 mg total) by mouth 2 (two) times daily for 14 days.   [DISCONTINUED] Testosterone (ANDROGEL PUMP) 20.25 MG/ACT (1.62%) GEL Apply 4 pumps daily to upper chest and shoulders (Patient not taking: Reported on 06/02/2020)   No facility-administered encounter medications on file as of 07/18/2022.    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- no surgery   Depression    High cholesterol    Hypertension    Hypothyroidism    Low testosterone    Median nerve dysfunction    Radiculopathy of cervical region    Thyroid disease    Ulnar neuropathy at elbow    Weakness    Past Surgical History:  Procedure Laterality Date   BICEPS TENDON REPAIR Right    KNEE ARTHROSCOPY WITH LATERAL MENISECTOMY Left 10/09/2021   Procedure: KNEE ARTHROSCOPY WITH LATERAL AND MEDIAL MENISCECTOMY;  Surgeon: Carole Civil, MD;  Location: [facility];  Service: Orthopedics;  Laterality: Left;   NECK SURGERY     Fusion done two seperate times   ROTATOR CUFF REPAIR Right    Family History  Problem Relation Age of Onset   Hypertension Mother    Stroke Mother 60   Arrhythmia Father    COPD Father    Fibromyalgia Sister    Asthma Brother    Bipolar disorder Brother    Asthma Daughter    Social History   Socioeconomic History   Marital status: Divorced    Spouse name: Not on file   Number of children: 1   Years of education: GED   Highest education level: GED or equivalent  Occupational History   Occupation: Disability    Comment: Worked as Community education officer  Tobacco Use   Smoking status: Every Day    Packs/day: 0.50    Years: 16.00    Total pack years: 8.00    Types: Cigarettes   Smokeless tobacco: Former    Types: Snuff, Chew    Quit date: 10/05/2019  Vaping Use   Vaping Use: Never used  Substance and Sexual Activity   Alcohol use: No   Drug use: No   Sexual activity: Yes  Other Topics Concern   Not on file  Social History Narrative   Lives with daughter   Social Determinants of Health   Financial Resource Strain: Low Risk  (07/18/2022)   Overall Financial Resource Strain (CARDIA)    Difficulty of  Paying Living Expenses: Not hard at all  Food Insecurity: No Food Insecurity (07/18/2022)   Hunger Vital Sign    Worried About Running Out of Food in the Last Year: Never true    [place] in the Last Year: Never true  Transportation Needs: No Transportation Needs (07/18/2022)   PRAPARE - Hydrologist (Medical): No    Lack of Transportation (Non-Medical): No  Physical Activity: Insufficiently Active (07/18/2022)   Exercise Vital Sign    Days of Exercise per Week: 2 days  Minutes of Exercise per Session: 20 min  Stress: No Stress Concern Present (07/18/2022)   [place]    Feeling of Stress : Not at all  Social Connections: Moderately Integrated (07/18/2022)   Social Connection and Isolation Panel [NHANES]    Frequency of Communication with Friends and Family: Three times a week    Frequency of Social Gatherings with Friends and Family: Once a week    Attends Religious Services: More than 4 times per year    Active Member of [employer] or Organizations: Yes    Attends Archivist Meetings: 1 to 4 times per year    Marital Status: Divorced    Tobacco Counseling Ready to quit: Not Answered Counseling given: Not Answered   Clinical Intake:  Pre-visit preparation completed: Yes  Pain : No/denies pain     Nutritional Risks: None Diabetes: Yes CBG done?: No Did pt. bring in CBG monitor from home?: No  How often do you need to have someone help you when you read instructions, pamphlets, or other written materials from your doctor or pharmacy?: 1 - Never What is the last grade level you completed in school?: GED  Diabetic?Yes Nutrition Risk Assessment:  Has the patient had any N/V/D within the last 2 months?  No  Does the patient have any non-healing wounds?  No  Has the patient had any unintentional weight loss or weight gain?  No   Diabetes:  Is the patient  diabetic?  Yes  If diabetic, was a CBG obtained today?  No  Did the patient bring in their glucometer from home?  No  How often do you monitor your CBG's? Never.   Financial Strains and Diabetes Management:  Are you having any financial strains with the device, your supplies or your medication? No .  Does the patient want to be seen by Chronic Care Management for management of their diabetes?  No  Would the patient like to be referred to a Nutritionist or for Diabetic Management?  No   Diabetic Exams:  Diabetic Eye Exam: Overdue for diabetic eye exam. Pt has been advised about the importance in completing this exam. Patient advised to call and schedule an eye exam.  Diabetic Foot Exam: Completed      Interpreter Needed?: No  Information entered by :: Felicity Coyer LPN   Activities of Daily Living    07/18/2022    8:21 AM 10/09/2021    9:18 AM  In your present state of health, do you have any difficulty performing the following activities:  Hearing? 0 0  Vision? 1 0  Comment patient had bifocals but was having difficulty adjusting to seeing with them so he stopped wearing them   Difficulty concentrating or making decisions? 0 0  Walking or climbing stairs? 1 1  Comment left knee pain   Dressing or bathing? 0 0  Doing errands, shopping? 0   Preparing Food and eating ? N   Using the Toilet? N   Do you have problems with loss of bowel control? N   Managing your Medications? N   Managing your Finances? N   Housekeeping or managing your Housekeeping? N     Patient Care Team: Claretta Fraise, MD as PCP - General (Family Medicine)  Indicate any recent Medical Services you may have received from other than [facility] providers in the past year (date may be approximate).     Assessment:   This is a routine wellness  examination for [place].  Hearing/Vision screen No results found.  Dietary issues and exercise activities discussed: Current Exercise Habits: The patient does not  participate in regular exercise at present, Exercise limited by: orthopedic condition(s)   Goals Addressed             This Visit's Progress    Patient Stated       07/18/2022 AWV Goal: Exercise for General Health  Patient will verbalize understanding of the benefits of increased physical activity: Exercising regularly is important. It will improve your overall fitness, flexibility, and endurance. Regular exercise also will improve your overall health. It can help you control your weight, reduce stress, and improve your bone density. Over the next year, patient will increase physical activity as tolerated with a goal of at least 150 minutes of moderate physical activity per week.  You can tell that you are exercising at a moderate intensity if your heart starts beating faster and you start breathing faster but can still hold a conversation. Moderate-intensity exercise ideas include: Walking 1 mile (1.6 km) in about 15 minutes Biking Hiking Golfing Dancing Water aerobics Patient will verbalize understanding of everyday activities that increase physical activity by providing examples like the following: Yard work, such as: Sales promotion account executive Gardening Washing windows or floors Patient will be able to explain general safety guidelines for exercising:  Before you start a new exercise program, talk with your health care provider. Do not exercise so much that you hurt yourself, feel dizzy, or get very short of breath. Wear comfortable clothes and wear shoes with good support. Drink plenty of water while you exercise to prevent dehydration or heat stroke. Work out until your breathing and your heartbeat get faster.        Depression Screen    07/18/2022    8:19 AM 06/05/2022    9:02 AM 06/05/2022    8:36 AM 03/05/2022    9:11 AM 03/05/2022    9:03 AM 02/05/2022    1:39 PM 02/05/2022    1:29 PM  PHQ  2/9 Scores  PHQ - 2 Score 0 2 0 2 0 2 0  PHQ- 9 Score 0 5  2  4      Fall Risk    07/18/2022    8:24 AM 06/05/2022    8:36 AM 03/05/2022    9:03 AM 02/05/2022    1:29 PM 01/01/2022    3:35 PM  Fall Risk   Falls in the past year? 0 0 0 0 0  Follow up Falls evaluation completed        FALL RISK PREVENTION PERTAINING TO THE HOME:  Any stairs in or around the home? Yes  If so, are there any without handrails? No  Home free of loose throw rugs in walkways, pet beds, electrical cords, etc? No  Adequate lighting in your home to reduce risk of falls? Yes   ASSISTIVE DEVICES UTILIZED TO PREVENT FALLS:  Life alert? No  Use of a cane, walker or w/c? No  Grab bars in the bathroom? Yes  Shower chair or bench in shower? No  Elevated toilet seat or a handicapped toilet? No   TIMED UP AND GO:  Was the test performed? No .     Cognitive Function:    10/22/2017    9:09 AM  MMSE - Mini Mental State Exam  Orientation to time 5  Orientation to Place 5  Registration  3  Attention/ Calculation 3  Recall 3  Language- name 2 objects 2  Language- repeat 1  Language- follow 3 step command 3  Language- read & follow direction 1  Write a sentence 1  Copy design 1  Total score 28        07/18/2022    8:23 AM  6CIT Screen  What Year? 0 points  What month? 0 points  What time? 0 points  Count back from 20 0 points  Months in reverse 0 points  Repeat phrase 0 points  Total Score 0 points    Immunizations Immunization History  Administered Date(s) Administered   Influenza,inj,Quad PF,6+ Mos 07/01/2018   Tdap 11/16/2014    TDAP status: Up to date  Flu Vaccine status: Due, Education has been provided regarding the importance of this vaccine. Advised may receive this vaccine at local pharmacy or Health Dept. Aware to provide a copy of the vaccination record if obtained from local pharmacy or Health Dept. Verbalized acceptance and understanding.  Pneumococcal vaccine status:  Declined,  Education has been provided regarding the importance of this vaccine but patient still declined. Advised may receive this vaccine at local pharmacy or Health Dept. Aware to provide a copy of the vaccination record if obtained from local pharmacy or Health Dept. Verbalized acceptance and understanding.   Covid-19 vaccine status: Information provided on how to obtain vaccines.   Qualifies for Shingles Vaccine? No   Zostavax completed No   Shingrix Completed?: No.    Education has been provided regarding the importance of this vaccine. Patient has been advised to call insurance company to determine out of pocket expense if they have not yet received this vaccine. Advised may also receive vaccine at local pharmacy or Health Dept. Verbalized acceptance and understanding.  Screening Tests Health Maintenance  Topic Date Due   COVID-19 Vaccine (1) Never done   COLONOSCOPY (Pts 45-51yrs Insurance coverage will need to be confirmed)  Never done   Medicare Annual Wellness (AWV)  08/16/2022   INFLUENZA VACCINE  12/22/2022 (Originally 04/23/2022)   Hepatitis C Screening  02/06/2023 (Originally 05/25/1991)   TETANUS/TDAP  11/16/2024   HIV Screening  Completed   HPV VACCINES  Aged Out    Health Maintenance  Health Maintenance Due  Topic Date Due   COVID-19 Vaccine (1) Never done   COLONOSCOPY (Pts 45-78yrs Insurance coverage will need to be confirmed)  Never done   Medicare Annual Wellness (AWV)  08/16/2022    Colorectal cancer screening: Referral to GI placed 07/18/2022. Pt aware the office will call re: appt.  Lung Cancer Screening: (Low Dose CT Chest recommended if Age 31-80 years, 30 pack-year currently smoking OR have quit w/in 15years.) does not qualify.   Lung Cancer Screening Referral: N/A  Additional Screening:  Hepatitis C Screening: does not qualify  Vision Screening: Recommended annual ophthalmology exams for early detection of glaucoma and other disorders of the eye. Is  the patient up to date with their annual eye exam?  No  Who is the provider or what is the name of the office in which the patient attends annual eye exams? [facility] If pt is not established with a provider, would they like to be referred to a provider to establish care? No .   Dental Screening: Recommended annual dental exams for proper oral hygiene  Community Resource Referral / Chronic Care Management: CRR required this visit?  No   CCM required this visit?  No  Plan:     I have personally reviewed and noted the following in the patient's chart:   Medical and social history Use of alcohol, tobacco or illicit drugs  Current medications and supplements including opioid prescriptions. Patient is not currently taking opioid prescriptions. Functional ability and status Nutritional status Physical activity Advanced directives List of other physicians Hospitalizations, surgeries, and ER visits in previous 12 months Vitals Screenings to include cognitive, depression, and falls Referrals and appointments  In addition, I have reviewed and discussed with patient certain preventive protocols, quality metrics, and best practice recommendations. A written personalized care plan for preventive services as well as general preventive health recommendations were provided to patient.     Felicity Coyer LPN   [id number]   Nurse Notes: Encouraged patient to contact [facility] to arrange for a vision screening.

## 2022-07-19 ENCOUNTER — Ambulatory Visit (INDEPENDENT_AMBULATORY_CARE_PROVIDER_SITE_OTHER): Payer: Medicare HMO | Admitting: Family

## 2022-07-19 ENCOUNTER — Encounter: Payer: Self-pay | Admitting: Family

## 2022-07-19 ENCOUNTER — Ambulatory Visit (INDEPENDENT_AMBULATORY_CARE_PROVIDER_SITE_OTHER): Payer: Medicare HMO

## 2022-07-19 VITALS — BP 118/80 | HR 71 | Temp 97.3°F | Ht 68.0 in | Wt 339.0 lb

## 2022-07-19 DIAGNOSIS — J441 Chronic obstructive pulmonary disease with (acute) exacerbation: Secondary | ICD-10-CM | POA: Diagnosis not present

## 2022-07-19 DIAGNOSIS — R051 Acute cough: Secondary | ICD-10-CM | POA: Diagnosis not present

## 2022-07-19 DIAGNOSIS — F17200 Nicotine dependence, unspecified, uncomplicated: Secondary | ICD-10-CM | POA: Diagnosis not present

## 2022-07-19 DIAGNOSIS — R059 Cough, unspecified: Secondary | ICD-10-CM | POA: Diagnosis not present

## 2022-07-19 MED ORDER — BENZONATATE 200 MG PO CAPS: 200.0000 mg | ORAL_CAPSULE | Freq: Three times a day (TID) | ORAL | 1 refills | Status: DC | PRN

## 2022-07-19 MED ORDER — PREDNISONE 10 MG (21) PO TBPK: ORAL_TABLET | ORAL | 0 refills | Status: DC

## 2022-07-19 NOTE — Patient Instructions (Signed)

## 2022-07-19 NOTE — Progress Notes (Signed)
Subjective:    Patient ID: Roy [name], male    DOB: Jun 02, 1971, 51 y.o.   MRN: [id number]  Chief Complaint  Patient presents with   Cough    Went to urgent care started last Thursday wants chest xray    Pt presents to the office today with increased SOB, chest pain, and cough. He was seen at the Urgent Care on 07/13/22 and given doxycycline and prednisone. He reports he feels mildly improved.  Cough This is a new problem. The current episode started 1 to 4 weeks ago. The problem has been gradually worsening. The problem occurs every few minutes. The cough is Productive of sputum. Associated symptoms include a fever, headaches, nasal congestion, postnasal drip, a sore throat, shortness of breath and wheezing. Pertinent negatives include no chills, ear congestion, ear pain or myalgias. Risk factors for lung disease include smoking/tobacco exposure. He has tried rest for the symptoms. The treatment provided mild relief.      Review of Systems  Constitutional:  Positive for fever. Negative for chills.  HENT:  Positive for postnasal drip and sore throat. Negative for ear pain.   Respiratory:  Positive for cough, shortness of breath and wheezing.   Musculoskeletal:  Negative for myalgias.  Neurological:  Positive for headaches.  All other systems reviewed and are negative.      Objective:   Physical Exam Vitals reviewed.  Constitutional:      General: He is not in acute distress.    Appearance: He is well-developed. He is obese.  HENT:     Head: Normocephalic.  Eyes:     General:        Right eye: No discharge.        Left eye: No discharge.     Pupils: Pupils are equal, round, and reactive to light.  Neck:     Thyroid: No thyromegaly.  Cardiovascular:     Rate and Rhythm: Normal rate and regular rhythm.     Heart sounds: Normal heart sounds. No murmur heard. Pulmonary:     Effort: Pulmonary effort is normal. No respiratory distress.     Breath sounds: Wheezing (LLL) and  rhonchi present.  Abdominal:     General: Bowel sounds are normal. There is no distension.     Palpations: Abdomen is soft.     Tenderness: There is no abdominal tenderness.  Musculoskeletal:        General: No tenderness. Normal range of motion.     Cervical back: Normal range of motion and neck supple.  Skin:    General: Skin is warm and dry.     Findings: No erythema or rash.  Neurological:     Mental Status: He is alert and oriented to person, place, and time.     Cranial Nerves: No cranial nerve deficit.     Deep Tendon Reflexes: Reflexes are normal and symmetric.  Psychiatric:        Behavior: Behavior normal.        Thought Content: Thought content normal.        Judgment: Judgment normal.       BP 118/80   Pulse 71   Temp (!) 97.3 F (36.3 C) (Temporal)   Ht 5\' 8"  (1.727 m)   Wt (!) 339 lb (153.8 kg)   SpO2 96%   BMI 51.54 kg/m      Assessment & Plan:  Roy [name] comes in today with chief complaint of Cough (Went to urgent care started  last Thursday wants chest xray )   Diagnosis and orders addressed:  1. Acute cough  - DG Chest 2 View; Future - benzonatate (TESSALON) 200 MG capsule; Take 1 capsule (200 mg total) by mouth 3 (three) times daily as needed.  Dispense: 30 capsule; Refill: 1 - predniSONE (STERAPRED UNI-PAK 21 TAB) 10 MG (21) TBPK tablet; Use as directed  Dispense: 21 tablet; Refill: 0   2. COPD exacerbation (HCC) Smoking cessation  Start prednisone  Continue doxycyline  - DG Chest 2 View; Future - benzonatate (TESSALON) 200 MG capsule; Take 1 capsule (200 mg total) by mouth 3 (three) times daily as needed.  Dispense: 30 capsule; Refill: 1 - predniSONE (STERAPRED UNI-PAK 21 TAB) 10 MG (21) TBPK tablet; Use as directed  Dispense: 21 tablet; Refill: 0  3. Current smoker    Jannifer Rodney, FNP

## 2022-07-24 ENCOUNTER — Encounter: Payer: Self-pay | Admitting: Family Medicine

## 2022-07-24 ENCOUNTER — Ambulatory Visit (INDEPENDENT_AMBULATORY_CARE_PROVIDER_SITE_OTHER): Payer: Medicare HMO | Admitting: Family Medicine

## 2022-07-24 VITALS — BP 136/82 | HR 82 | Temp 97.5°F | Ht 68.0 in | Wt 343.0 lb

## 2022-07-24 DIAGNOSIS — J441 Chronic obstructive pulmonary disease with (acute) exacerbation: Secondary | ICD-10-CM

## 2022-07-24 MED ORDER — DEXAMETHASONE 2 MG PO TABS: ORAL_TABLET | ORAL | 0 refills | Status: DC

## 2022-07-24 MED ORDER — PROMETHAZINE-DM 6.25-15 MG/5ML PO SYRP: 5.0000 mL | ORAL_SOLUTION | Freq: Four times a day (QID) | ORAL | 0 refills | Status: DC | PRN

## 2022-07-24 NOTE — Progress Notes (Signed)
BP 136/82   Pulse 82   Temp (!) 97.5 F (36.4 C)   Ht 5\' 8"  (1.727 m)   Wt (!) 343 lb (155.6 kg)   SpO2 98%   BMI 52.15 kg/m    Subjective:   Patient ID: Roy [name], male    DOB: 1970-11-24, 51 y.o.   MRN: [id number]  HPI: Roy [name] is a 51 y.o. male presenting on 07/24/2022 for chest congestion and Shortness of Breath   HPI Cough and congestion Patient is coming in today for cough and chest congestion.  He says he was treated initially at the urgent care couple weeks ago with doxycycline and short course of prednisone and came in here just over a week ago and had a chest x-ray doxycycline and was given another round of prednisone.  He says it is better while he is on it and then it comes back.  He says he still feels some tightness down his chest and congestion.  He denies any chest pain or palpitations or flutters.  His cough is mostly dry at this point has improved.  Relevant past medical, surgical, family and social history reviewed and updated as indicated. Interim medical history since our last visit reviewed. Allergies and medications reviewed and updated.  Review of Systems  Constitutional:  Negative for chills and fever.  HENT:  Positive for congestion. Negative for ear discharge, ear pain, postnasal drip, rhinorrhea, sinus pressure, sneezing, sore throat and voice change.   Eyes:  Negative for pain, discharge, redness and visual disturbance.  Respiratory:  Positive for cough, chest tightness and wheezing. Negative for shortness of breath.   Cardiovascular:  Negative for chest pain and leg swelling.  Musculoskeletal:  Negative for gait problem.  Skin:  Negative for rash.  All other systems reviewed and are negative.   Per HPI unless specifically indicated above   Allergies as of 07/24/2022       Reactions   Penicillins Rash, Other (See Comments)   Has patient had a PCN reaction causing immediate rash, facial/tongue/throat swelling, SOB or lightheadedness  with hypotension: Yes Has patient had a PCN reaction causing severe rash involving mucus membranes or skin necrosis: No Has patient had a PCN reaction that required hospitalization: No Has patient had a PCN reaction occurring within the last 10 years: No If all of the above answers are "NO", then may proceed with Cephalosporin use. Has patient had a PCN reaction causing immediate rash, facial/tongue/throat swelling, SOB or lightheadedness with hypotension: Yes Has patient had a PCN reaction causing severe rash involving mucus membranes or skin necrosis: No Has patient had a PCN reaction that required hospitalization: No Has patient had a PCN reaction occurring within the last 10 years: No If all of the above answers are "NO", then may proceed with Cephalosporin use. unknown   Chantix [varenicline] Other (See Comments)   Severe headache        Medication List        Accurate as of July 24, 2022  2:13 PM. If you have any questions, ask your nurse or doctor.          STOP taking these medications    predniSONE 10 MG (21) Tbpk tablet Commonly known as: STERAPRED UNI-PAK 21 TAB Stopped by: Fransisca Kaufmann Kyarra Vancamp, MD       TAKE these medications    acetaminophen 500 MG tablet Commonly known as: TYLENOL Take 1,000 mg by mouth every 6 (six) hours as needed for headache (pain).  albuterol 108 (90 Base) MCG/ACT inhaler Commonly known as: VENTOLIN HFA Inhale 1-2 puffs into the lungs every 6 (six) hours as needed for wheezing or shortness of breath.   ALPRAZolam 0.5 MG tablet Commonly known as: XANAX Take 1 tablet (0.5 mg total) by mouth 3 (three) times daily as needed. for anxiety   benzonatate 200 MG capsule Commonly known as: TESSALON Take 1 capsule (200 mg total) by mouth 3 (three) times daily as needed.   carvedilol 6.25 MG tablet Commonly known as: COREG Take 1 tablet (6.25 mg total) by mouth 2 (two) times daily with a meal.   dexamethasone 2 MG tablet Commonly  known as: DECADRON Take 4 tablets for 3 days then 3 tablets for 3 days then 2 tablets for 3 days then 1 tablet for 3 days Started by: Worthy Rancher, MD   Dexilant 60 MG capsule Generic drug: dexlansoprazole Take 1 capsule (60 mg total) by mouth daily.   diclofenac 75 MG EC tablet Commonly known as: VOLTAREN Take 1 tablet (75 mg total) by mouth 2 (two) times daily with a meal.   docusate sodium 100 MG capsule Commonly known as: COLACE Take 100 mg by mouth daily.   doxycycline 100 MG capsule Commonly known as: VIBRAMYCIN Take 100 mg by mouth 2 (two) times daily.   DULoxetine 60 MG capsule Commonly known as: CYMBALTA Take 2 capsules (120 mg total) by mouth daily.   fluticasone 50 MCG/ACT nasal spray Commonly known as: FLONASE Place 2 sprays into both nostrils daily.   gemfibrozil 600 MG tablet Commonly known as: LOPID Take 1 tablet (600 mg total) by mouth 2 (two) times daily.   levothyroxine 50 MCG tablet Commonly known as: SYNTHROID Take 1 tablet (50 mcg total) by mouth daily.   linaclotide 290 MCG Caps capsule Commonly known as: LINZESS Take 1 capsule (290 mcg total) by mouth daily as needed (constipation).   promethazine-dextromethorphan 6.25-15 MG/5ML syrup Commonly known as: PROMETHAZINE-DM Take 5 mLs by mouth 4 (four) times daily as needed for cough. Started by: Fransisca Kaufmann Sonji Starkes, MD   QUEtiapine 200 MG tablet Commonly known as: SEROQUEL TAKE 1 TABLET BY MOUTH AT BEDTIME FOR  NERVES   rosuvastatin 5 MG tablet Commonly known as: Crestor Take 1 tablet (5 mg total) by mouth daily. For cholesterol   sildenafil 20 MG tablet Commonly known as: REVATIO Take 2-5 tablets daily as needed for sex   Symbicort 160-4.5 MCG/ACT inhaler Generic drug: budesonide-formoterol INHALE 2 PUFFS INTO LUNGS TWICE DAILY FOR ASTHMA   traZODone 100 MG tablet Commonly known as: DESYREL Take 1 tablet (100 mg total) by mouth at bedtime.   Trulicity 1.5 0000000 Sopn Generic  drug: Dulaglutide Inject content of one pen under the skin weekly   vitamin C 1000 MG tablet Take 1,000 mg by mouth daily.   Vitamin D (Ergocalciferol) 1.25 MG (50000 UNIT) Caps capsule Commonly known as: DRISDOL Take 1 capsule (50,000 Units total) by mouth every 7 (seven) days. What changed: when to take this   zinc gluconate 50 MG tablet Take 50 mg by mouth daily.         Objective:   BP 136/82   Pulse 82   Temp (!) 97.5 F (36.4 C)   Ht 5\' 8"  (1.727 m)   Wt (!) 343 lb (155.6 kg)   SpO2 98%   BMI 52.15 kg/m   Wt Readings from Last 3 Encounters:  07/24/22 (!) 343 lb (155.6 kg)  07/19/22 (!) 339 lb (153.8  kg)  06/05/22 (!) 337 lb 12.8 oz (153.2 kg)    Physical Exam Vitals and nursing note reviewed.  Constitutional:      General: He is not in acute distress.    Appearance: He is well-developed. He is not diaphoretic.  HENT:     Right Ear: Tympanic membrane, ear canal and external ear normal.     Left Ear: Tympanic membrane, ear canal and external ear normal.     Nose: No mucosal edema or rhinorrhea.     Right Sinus: No maxillary sinus tenderness or frontal sinus tenderness.     Left Sinus: No maxillary sinus tenderness or frontal sinus tenderness.     Mouth/Throat:     Pharynx: Uvula midline. No oropharyngeal exudate or posterior oropharyngeal erythema.     Tonsils: No tonsillar abscesses.  Eyes:     General: No scleral icterus.       Right eye: No discharge.     Conjunctiva/sclera: Conjunctivae normal.     Pupils: Pupils are equal, round, and reactive to light.  Neck:     Thyroid: No thyromegaly.  Cardiovascular:     Rate and Rhythm: Normal rate and regular rhythm.     Heart sounds: Normal heart sounds. No murmur heard. Pulmonary:     Effort: Pulmonary effort is normal. No respiratory distress.     Breath sounds: Normal breath sounds. No wheezing, rhonchi or rales.  Musculoskeletal:        General: Normal range of motion.     Cervical back: Neck supple.   Lymphadenopathy:     Cervical: No cervical adenopathy.  Skin:    General: Skin is warm and dry.     Findings: No rash.  Neurological:     Mental Status: He is alert and oriented to person, place, and time.     Coordination: Coordination normal.  Psychiatric:        Behavior: Behavior normal.       Assessment & Plan:   Problem List Items Addressed This Visit   None Visit Diagnoses     COPD exacerbation ([place])    -  Primary   Relevant Medications   dexamethasone (DECADRON) 2 MG tablet   promethazine-dextromethorphan (PROMETHAZINE-DM) 6.25-15 MG/5ML syrup       I do not hear wheezing today so it must be improved but just not cleared.  We will give dexamethasone she will be stronger and also gave a sample of breztri that he can use instead of the Symbicort.  And see if that helps [place] Follow up plan: Return if symptoms worsen or fail to improve.  Counseling provided for all of the vaccine components No orders of the defined types were placed in this encounter.   Caryl Pina, MD [facility] 07/24/2022, 2:13 PM

## 2022-07-25 ENCOUNTER — Other Ambulatory Visit: Payer: Self-pay | Admitting: Family

## 2022-07-25 MED ORDER — LEVOFLOXACIN 500 MG PO TABS: 500.0000 mg | ORAL_TABLET | Freq: Every day | ORAL | 0 refills | Status: AC

## 2022-07-29 ENCOUNTER — Other Ambulatory Visit: Payer: Self-pay | Admitting: Family Medicine

## 2022-07-29 DIAGNOSIS — I10 Essential (primary) hypertension: Secondary | ICD-10-CM

## 2022-07-30 ENCOUNTER — Telehealth: Payer: Self-pay | Admitting: Family Medicine

## 2022-07-30 DIAGNOSIS — J441 Chronic obstructive pulmonary disease with (acute) exacerbation: Secondary | ICD-10-CM

## 2022-07-30 NOTE — Telephone Encounter (Signed)
Pt called stating that he recently saw Dr Warrick Parisian and was told by Dr Dettinger that if symptoms did not improve, to call office and let him know.  Pt says he is still taking the medicine that Dr Dettinger prescribed him but says symptoms are no better.  Needs advise.

## 2022-07-31 NOTE — Telephone Encounter (Signed)
Patient aware, referral placed to pulmonology.

## 2022-07-31 NOTE — Telephone Encounter (Signed)
I do not know that I have anything else for him but I could do, I gave him the strongest stuff but I could have and if it did not help at all, the only other option that I can see is to refer him to pulmonology.  Diagnosis COPD-place referral for him please

## 2022-08-12 ENCOUNTER — Other Ambulatory Visit: Payer: Self-pay | Admitting: Family Medicine

## 2022-08-30 ENCOUNTER — Other Ambulatory Visit: Payer: Self-pay | Admitting: Family Medicine

## 2022-08-30 DIAGNOSIS — J418 Mixed simple and mucopurulent chronic bronchitis: Secondary | ICD-10-CM

## 2022-09-02 ENCOUNTER — Other Ambulatory Visit: Payer: Self-pay | Admitting: Family Medicine

## 2022-09-02 DIAGNOSIS — F411 Generalized anxiety disorder: Secondary | ICD-10-CM

## 2022-09-03 ENCOUNTER — Encounter: Payer: Self-pay | Admitting: Family Medicine

## 2022-09-05 ENCOUNTER — Telehealth: Payer: Self-pay | Admitting: Family Medicine

## 2022-09-05 ENCOUNTER — Encounter: Payer: Self-pay | Admitting: Family Medicine

## 2022-09-05 ENCOUNTER — Ambulatory Visit (INDEPENDENT_AMBULATORY_CARE_PROVIDER_SITE_OTHER): Payer: Medicare HMO | Admitting: Family Medicine

## 2022-09-05 ENCOUNTER — Other Ambulatory Visit: Payer: Self-pay | Admitting: Family Medicine

## 2022-09-05 VITALS — BP 127/70 | HR 88 | Temp 98.0°F | Ht 68.0 in | Wt 348.2 lb

## 2022-09-05 DIAGNOSIS — E119 Type 2 diabetes mellitus without complications: Secondary | ICD-10-CM | POA: Diagnosis not present

## 2022-09-05 DIAGNOSIS — I10 Essential (primary) hypertension: Secondary | ICD-10-CM | POA: Diagnosis not present

## 2022-09-05 DIAGNOSIS — J40 Bronchitis, not specified as acute or chronic: Secondary | ICD-10-CM | POA: Diagnosis not present

## 2022-09-05 DIAGNOSIS — F411 Generalized anxiety disorder: Secondary | ICD-10-CM

## 2022-09-05 DIAGNOSIS — J329 Chronic sinusitis, unspecified: Secondary | ICD-10-CM | POA: Diagnosis not present

## 2022-09-05 DIAGNOSIS — E782 Mixed hyperlipidemia: Secondary | ICD-10-CM

## 2022-09-05 DIAGNOSIS — G459 Transient cerebral ischemic attack, unspecified: Secondary | ICD-10-CM

## 2022-09-05 DIAGNOSIS — E038 Other specified hypothyroidism: Secondary | ICD-10-CM

## 2022-09-05 DIAGNOSIS — J418 Mixed simple and mucopurulent chronic bronchitis: Secondary | ICD-10-CM | POA: Diagnosis not present

## 2022-09-05 DIAGNOSIS — R7303 Prediabetes: Secondary | ICD-10-CM | POA: Diagnosis not present

## 2022-09-05 LAB — BAYER DCA HB A1C WAIVED: HB A1C (BAYER DCA - WAIVED): 7 % — ABNORMAL HIGH (ref 4.8–5.6)

## 2022-09-05 MED ORDER — SYMBICORT 160-4.5 MCG/ACT IN AERO: INHALATION_SPRAY | RESPIRATORY_TRACT | 3 refills | Status: DC

## 2022-09-05 MED ORDER — ALPRAZOLAM 0.5 MG PO TABS: 0.5000 mg | ORAL_TABLET | Freq: Three times a day (TID) | ORAL | 5 refills | Status: DC | PRN

## 2022-09-05 MED ORDER — TRAZODONE HCL 100 MG PO TABS: 100.0000 mg | ORAL_TABLET | Freq: Every day | ORAL | 3 refills | Status: DC

## 2022-09-05 MED ORDER — VITAMIN D (ERGOCALCIFEROL) 1.25 MG (50000 UNIT) PO CAPS: 50000.0000 [IU] | ORAL_CAPSULE | ORAL | 3 refills | Status: DC

## 2022-09-05 MED ORDER — ALBUTEROL SULFATE HFA 108 (90 BASE) MCG/ACT IN AERS: 1.0000 | INHALATION_SPRAY | Freq: Four times a day (QID) | RESPIRATORY_TRACT | 5 refills | Status: DC | PRN

## 2022-09-05 MED ORDER — ROSUVASTATIN CALCIUM 5 MG PO TABS: 5.0000 mg | ORAL_TABLET | Freq: Every day | ORAL | 3 refills | Status: DC

## 2022-09-05 MED ORDER — TRULICITY 3 MG/0.5ML ~~LOC~~ SOAJ: 3.0000 mg | SUBCUTANEOUS | 2 refills | Status: DC

## 2022-09-05 MED ORDER — CARVEDILOL 6.25 MG PO TABS: 6.2500 mg | ORAL_TABLET | Freq: Two times a day (BID) | ORAL | 3 refills | Status: DC

## 2022-09-05 NOTE — Telephone Encounter (Signed)
  Prescription Request  09/05/2022  Is this a "Controlled Substance" medicine?   Have you seen your PCP in the last 2 weeks? Seen 12/14  If YES, route message to pool  -  If NO, patient needs to be scheduled for appointment.  What is the name of the medication or equipment? ALPRAZolam (XANAX) 0.5 MG tablet  Have you contacted your pharmacy to request a refill? Yes    Which pharmacy would you like this sent to?  [facility] [address], [place] - [address] (Ph: [phone])     Patient notified that their request is being sent to the clinical staff for review and that they should receive a response within 2 business days.

## 2022-09-05 NOTE — Telephone Encounter (Signed)
Made appt for jan 2

## 2022-09-05 NOTE — Progress Notes (Signed)
Subjective:  Patient ID: Roy [name], male    DOB: 02-10-1971  Age: 51 y.o. MRN: [id number]  CC: Medical Management of Chronic Issues   HPI Roy [name] presents for describes episode feels like he passsed out while driving. Just found himself on [address], on the way home from [place]. Felt disoriented. Gradually things came back to normal and he had a severe HA. Lasted 5-10 minutes. Then drove himself home. He last remembers meeting his wife at the [facility] leaving it, to drop off his daughter. Traveled 7-10 miles that he can't recall. Now having intermittent HA. 5-7/10.      09/05/2022    8:49 AM 09/05/2022    8:29 AM 07/24/2022    2:10 PM  Depression screen PHQ 2/9  Decreased Interest 1 0 1  Down, Depressed, Hopeless 1 0 0  PHQ - 2 Score 2 0 1  Altered sleeping 0  1  Tired, decreased energy 1  1  Change in appetite 1  1  Feeling bad or failure about yourself  1  0  Trouble concentrating 0  0  Moving slowly or fidgety/restless 0  0  Suicidal thoughts 0  0  PHQ-9 Score 5  4  Difficult doing work/chores Not difficult at all  Somewhat difficult      09/05/2022    8:50 AM 07/24/2022    2:11 PM 06/05/2022    9:02 AM 03/05/2022    9:11 AM  GAD 7 : Generalized Anxiety Score  Nervous, Anxious, on Edge _0 Control/stop worrying _1 Worry too much - different things _2 Trouble relaxing 0 0 0 0  Restless 0 0 0 0  Easily annoyed or irritable _3 Afraid - awful might happen _4 Total GAD 7 Score _5 Anxiety Difficulty Not difficult at all Not difficult at all Not difficult at all Not difficult at all      History Roy [name] has a past medical history of Anxiety, Asthma, 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   He has a past surgical history that includes  Rotator cuff repair (Right); Biceps tendon repair (Right); Neck surgery; and Knee arthroscopy with lateral menisectomy (Left, 10/09/2021).   His family history includes Arrhythmia in his father; Asthma in his brother and daughter; Bipolar disorder in his brother; COPD in his father; Fibromyalgia in his sister; Hypertension in his mother; Stroke (age of onset: 3) in his mother.He reports that he has been smoking cigarettes. He has a 8.00 pack-year smoking history. He quit smokeless tobacco use about 2 years ago.  His smokeless tobacco use included snuff and chew. He reports that he does not drink alcohol and does not use drugs.    ROS Review of Systems  Constitutional:  Negative for fever.  Respiratory:  Negative for shortness of breath.   Cardiovascular:  Negative for chest pain.  Musculoskeletal:  Negative for arthralgias.  Skin:  Negative for rash.  Neurological:  Negative for dizziness, speech difficulty and light-headedness.    Objective:  BP 127/70   Pulse 88   Temp 98 F (36.7 C)   Ht 5' 8" (1.727 m)   Wt (!) 348 lb 3.2 oz (157.9 kg)   SpO2 95%   BMI 52.94 kg/m  BP Readings from Last 3 Encounters:  09/07/22 127/82  09/05/22 127/70  07/24/22 136/82    Wt Readings from Last 3 Encounters:  09/07/22 (!) 345 lb (156.5 kg)  09/05/22 (!) 348 lb 3.2 oz (157.9 kg)  07/24/22 (!) 343 lb (155.6 kg)     Physical Exam Vitals reviewed.  Constitutional:      Appearance: He is well-developed.  HENT:     Head: Normocephalic and atraumatic.     Right Ear: External ear normal.     Left Ear: External ear normal.     Mouth/Throat:     Pharynx: No oropharyngeal exudate or posterior oropharyngeal erythema.  Eyes:     Pupils: Pupils are equal, round, and reactive to light.  Cardiovascular:     Rate and Rhythm: Normal rate and regular rhythm.     Heart sounds: No murmur heard. Pulmonary:     Effort: No respiratory distress.     Breath sounds: Normal breath sounds.   Musculoskeletal:     Cervical back: Normal range of motion and neck supple.  Neurological:     Mental Status: He is alert and oriented to person, place, and time.       Assessment & Plan:   Roy [name] was seen today for medical management of chronic issues.  Diagnoses and all orders for this visit:  TIA (transient ischemic attack) -     CT HEAD WO CONTRAST (5MM); Future  Diabetes mellitus, new onset ([name]) -     Bayer DCA Hb A1c Waived  Benign essential HTN -     CBC with Differential/Platelet -     CMP14+EGFR -     carvedilol (COREG) 6.25 MG tablet; Take 1 tablet (6.25 mg total) by mouth 2 (two) times daily with a meal.  Other specified hypothyroidism -     TSH + free T4  Mixed hyperlipidemia -     Lipid panel  Sinobronchitis -     albuterol (VENTOLIN HFA) 108 (90 Base) MCG/ACT inhaler; Inhale 1-2 puffs into the lungs every 6 (six) hours as needed for wheezing or shortness of breath.  GAD (generalized anxiety disorder) -     ALPRAZolam (XANAX) 0.5 MG tablet; Take 1 tablet (0.5 mg total) by mouth 3 (three) times daily as needed. for anxiety  Mixed simple and mucopurulent chronic bronchitis (HCC) -     SYMBICORT 160-4.5 MCG/ACT inhaler; INHALE 2 PUFFS BY MOUTH TWICE DAILY FOR ASTHMA  Other orders -     Dulaglutide (TRULICITY) 3 MO/7.0BE SOPN; Inject 3 mg as directed once a week. -     rosuvastatin (CRESTOR) 5 MG tablet; Take 1 tablet (5 mg total) by mouth daily. For cholesterol -     traZODone (DESYREL) 100 MG tablet; Take 1 tablet (100 mg total) by mouth at bedtime. -     Vitamin D, Ergocalciferol, (DRISDOL) 1.25 MG (50000 UNIT) CAPS capsule; Take 1 capsule (50,000 Units total) by mouth every 7 (seven) days.       I have discontinued Roy [name]. Roy [name]'s Trulicity. I have also changed his carvedilol and rosuvastatin. Additionally, I am having him start on Trulicity. Lastly, I am having him maintain his acetaminophen, sildenafil, vitamin C, zinc gluconate, diclofenac,  DULoxetine, Dexilant, gemfibrozil, levothyroxine, linaclotide, QUEtiapine, docusate sodium, doxycycline, benzonatate, dexamethasone, promethazine-dextromethorphan, fluticasone, albuterol, ALPRAZolam, Symbicort, traZODone, and Vitamin D (Ergocalciferol).  Allergies as of 09/05/2022       Reactions   Penicillins Rash, Other (See Comments)   Has patient had a PCN reaction causing  immediate rash, facial/tongue/throat swelling, SOB or lightheadedness with hypotension: Yes Has patient had a PCN reaction causing severe rash involving mucus membranes or skin necrosis: No Has patient had a PCN reaction that required hospitalization: No Has patient had a PCN reaction occurring within the last 10 years: No If all of the above answers are "NO", then may proceed with Cephalosporin use. Has patient had a PCN reaction causing immediate rash, facial/tongue/throat swelling, SOB or lightheadedness with hypotension: Yes Has patient had a PCN reaction causing severe rash involving mucus membranes or skin necrosis: No Has patient had a PCN reaction that required hospitalization: No Has patient had a PCN reaction occurring within the last 10 years: No If all of the above answers are "NO", then may proceed with Cephalosporin use. unknown   Chantix [varenicline] Other (See Comments)   Severe headache        Medication List        Accurate as of September 05, 2022 11:59 PM. If you have any questions, ask your nurse or doctor.          STOP taking these medications    Trulicity 1.5 IO/9.6EX Sopn Generic drug: Dulaglutide Replaced by: Trulicity 3 BM/8.4XL Sopn Stopped by: Claretta Fraise, MD       TAKE these medications    acetaminophen 500 MG tablet Commonly known as: TYLENOL Take 1,000 mg by mouth every 6 (six) hours as needed for headache (pain).   albuterol 108 (90 Base) MCG/ACT inhaler Commonly known as: VENTOLIN HFA Inhale 1-2 puffs into the lungs every 6 (six) hours as needed for wheezing  or shortness of breath.   ALPRAZolam 0.5 MG tablet Commonly known as: XANAX Take 1 tablet (0.5 mg total) by mouth 3 (three) times daily as needed. for anxiety   benzonatate 200 MG capsule Commonly known as: TESSALON Take 1 capsule (200 mg total) by mouth 3 (three) times daily as needed.   carvedilol 6.25 MG tablet Commonly known as: COREG Take 1 tablet (6.25 mg total) by mouth 2 (two) times daily with a meal.   dexamethasone 2 MG tablet Commonly known as: DECADRON Take 4 tablets for 3 days then 3 tablets for 3 days then 2 tablets for 3 days then 1 tablet for 3 days   Dexilant 60 MG capsule Generic drug: dexlansoprazole Take 1 capsule (60 mg total) by mouth daily.   diclofenac 75 MG EC tablet Commonly known as: VOLTAREN Take 1 tablet (75 mg total) by mouth 2 (two) times daily with a meal.   docusate sodium 100 MG capsule Commonly known as: COLACE Take 100 mg by mouth daily.   doxycycline 100 MG capsule Commonly known as: VIBRAMYCIN Take 100 mg by mouth 2 (two) times daily.   DULoxetine 60 MG capsule Commonly known as: CYMBALTA Take 2 capsules (120 mg total) by mouth daily.   fluticasone 50 MCG/ACT nasal spray Commonly known as: FLONASE SPRAY 2 SPRAYS INTO EACH NOSTRIL EVERY DAY   gemfibrozil 600 MG tablet Commonly known as: LOPID Take 1 tablet (600 mg total) by mouth 2 (two) times daily.   levothyroxine 50 MCG tablet Commonly known as: SYNTHROID Take 1 tablet (50 mcg total) by mouth daily.   linaclotide 290 MCG Caps capsule Commonly known as: LINZESS Take 1 capsule (290 mcg total) by mouth daily as needed (constipation).   promethazine-dextromethorphan 6.25-15 MG/5ML syrup Commonly known as: PROMETHAZINE-DM Take 5 mLs by mouth 4 (four) times daily as needed for cough.   QUEtiapine 200 MG tablet Commonly  known as: SEROQUEL TAKE 1 TABLET BY MOUTH AT BEDTIME FOR  NERVES   rosuvastatin 5 MG tablet Commonly known as: CRESTOR Take 1 tablet (5 mg total) by  mouth daily. For cholesterol   sildenafil 20 MG tablet Commonly known as: REVATIO Take 2-5 tablets daily as needed for sex   Symbicort 160-4.5 MCG/ACT inhaler Generic drug: budesonide-formoterol INHALE 2 PUFFS BY MOUTH TWICE DAILY FOR ASTHMA   traZODone 100 MG tablet Commonly known as: DESYREL Take 1 tablet (100 mg total) by mouth at bedtime.   Trulicity 3 FA/2.1HY Sopn Generic drug: Dulaglutide Inject 3 mg as directed once a week. Replaces: Trulicity 1.5 QM/5.7QI Sopn Started by: Claretta Fraise, MD   vitamin C 1000 MG tablet Take 1,000 mg by mouth daily.   Vitamin D (Ergocalciferol) 1.25 MG (50000 UNIT) Caps capsule Commonly known as: DRISDOL Take 1 capsule (50,000 Units total) by mouth every 7 (seven) days.   zinc gluconate 50 MG tablet Take 50 mg by mouth daily.         Follow-up: Return in about 1 month (around 10/06/2022).  Claretta Fraise, M.D.

## 2022-09-05 NOTE — Telephone Encounter (Signed)
Hey! His A1c is controlled  Just schedule under pharm clinic for January 60 min--schedule should be open It's probably just patient assistance Patient can call office for samples to bridge if needed

## 2022-09-06 LAB — CBC WITH DIFFERENTIAL/PLATELET
Basophils Absolute: 0 10*3/uL (ref 0.0–0.2)
Basos: 1 %
EOS (ABSOLUTE): 0.1 10*3/uL (ref 0.0–0.4)
Eos: 1 %
Hematocrit: 41.6 % (ref 37.5–51.0)
Hemoglobin: 13.8 g/dL (ref 13.0–17.7)
Immature Grans (Abs): 0 10*3/uL (ref 0.0–0.1)
Immature Granulocytes: 0 %
Lymphocytes Absolute: 2 10*3/uL (ref 0.7–3.1)
Lymphs: 26 %
MCH: 30.5 pg (ref 26.6–33.0)
MCHC: 33.2 g/dL (ref 31.5–35.7)
MCV: 92 fL (ref 79–97)
Monocytes Absolute: 0.5 10*3/uL (ref 0.1–0.9)
Monocytes: 6 %
Neutrophils Absolute: 5.1 10*3/uL (ref 1.4–7.0)
Neutrophils: 66 %
Platelets: 236 10*3/uL (ref 150–450)
RBC: 4.52 x10E6/uL (ref 4.14–5.80)
RDW: 13.2 % (ref 11.6–15.4)
WBC: 7.8 10*3/uL (ref 3.4–10.8)

## 2022-09-06 LAB — CMP14+EGFR
ALT: 15 IU/L (ref 0–44)
AST: 15 IU/L (ref 0–40)
Albumin/Globulin Ratio: 1.7 (ref 1.2–2.2)
Albumin: 4.5 g/dL (ref 4.1–5.1)
Alkaline Phosphatase: 83 IU/L (ref 44–121)
BUN/Creatinine Ratio: 8 — ABNORMAL LOW (ref 9–20)
BUN: 7 mg/dL (ref 6–24)
Bilirubin Total: 0.3 mg/dL (ref 0.0–1.2)
CO2: 22 mmol/L (ref 20–29)
Calcium: 9.7 mg/dL (ref 8.7–10.2)
Chloride: 99 mmol/L (ref 96–106)
Creatinine, Ser: 0.86 mg/dL (ref 0.76–1.27)
Globulin, Total: 2.6 g/dL (ref 1.5–4.5)
Glucose: 131 mg/dL — ABNORMAL HIGH (ref 70–99)
Potassium: 4.4 mmol/L (ref 3.5–5.2)
Sodium: 137 mmol/L (ref 134–144)
Total Protein: 7.1 g/dL (ref 6.0–8.5)
eGFR: 106 mL/min/{1.73_m2} (ref >59)

## 2022-09-06 LAB — LIPID PANEL
Chol/HDL Ratio: 3.1 ratio (ref 0.0–5.0)
Cholesterol, Total: 122 mg/dL (ref 100–199)
HDL: 39 mg/dL — ABNORMAL LOW (ref >39)
LDL Chol Calc (NIH): 46 mg/dL (ref 0–99)
Triglycerides: 232 mg/dL — ABNORMAL HIGH (ref 0–149)
VLDL Cholesterol Cal: 37 mg/dL (ref 5–40)

## 2022-09-06 LAB — TSH+FREE T4
Free T4: 1.06 ng/dL (ref 0.82–1.77)
TSH: 3.01 u[IU]/mL (ref 0.450–4.500)

## 2022-09-07 ENCOUNTER — Other Ambulatory Visit: Payer: Self-pay

## 2022-09-07 ENCOUNTER — Emergency Department (HOSPITAL_COMMUNITY): Payer: Medicare HMO

## 2022-09-07 ENCOUNTER — Emergency Department (HOSPITAL_COMMUNITY)
Admission: EM | Admit: 2022-09-07 | Discharge: 2022-09-07 | Disposition: A | Discharge status: Home / Self Care | Payer: Medicare HMO | Attending: Emergency Medicine | Admitting: Emergency Medicine

## 2022-09-07 ENCOUNTER — Encounter (HOSPITAL_COMMUNITY): Payer: Self-pay | Admitting: Emergency Medicine

## 2022-09-07 DIAGNOSIS — R410 Disorientation, unspecified: Secondary | ICD-10-CM | POA: Diagnosis not present

## 2022-09-07 DIAGNOSIS — R519 Headache, unspecified: Secondary | ICD-10-CM | POA: Diagnosis not present

## 2022-09-07 LAB — CBG MONITORING, ED: Glucose-Capillary: 140 mg/dL — ABNORMAL HIGH (ref 70–99)

## 2022-09-07 NOTE — Discharge Instructions (Signed)
As discussed your workup today is negative including your CT scan.  I do encourage a neurology follow-up if your symptoms persist or are not improving.
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
## 2022-09-07 NOTE — ED Triage Notes (Signed)
Pt c/o intermittent ha and confusion since last Saturday. Saw pcp on 12/14-outpatient CT scan ordered for 12/29. Pt states he feels like he should not wait that long.

## 2022-09-08 ENCOUNTER — Encounter: Payer: Self-pay | Admitting: Family Medicine

## 2022-09-08 NOTE — Progress Notes (Signed)
Hello Olis,  Your lab result is normal and/or stable.Some minor variations that are not significant are commonly marked abnormal, but do not represent any medical problem for you.  Best regards, Leona Alen, M.D.

## 2022-09-09 NOTE — Telephone Encounter (Signed)
Refill was sent in after visit on this same day

## 2022-09-11 ENCOUNTER — Telehealth: Payer: Self-pay

## 2022-09-11 NOTE — Telephone Encounter (Signed)
     Patient  visit on 12/16  at [facility]   Have you been able to follow up with your primary care physician? No   The patient was or was not able to obtain any needed medicine or equipment.  Yes   Are there diet recommendations that you are having difficulty following? NA  Patient expresses understanding of discharge instructions and education provided has no other needs at this time.  YES      Lenard Forth [facility] Guide, [facility], Care Management  [phone] [address] [place], [place], [place] [address] Phone: [phone] Email: [email or web address]

## 2022-09-18 ENCOUNTER — Other Ambulatory Visit: Payer: Self-pay | Admitting: Orthopedic Surgery

## 2022-09-18 DIAGNOSIS — S83242A Other tear of medial meniscus, current injury, left knee, initial encounter: Secondary | ICD-10-CM

## 2022-09-20 ENCOUNTER — Ambulatory Visit (HOSPITAL_COMMUNITY): Payer: Medicare HMO

## 2022-09-24 ENCOUNTER — Ambulatory Visit (INDEPENDENT_AMBULATORY_CARE_PROVIDER_SITE_OTHER): Payer: Medicare HMO | Admitting: Pharmacist

## 2022-09-24 VITALS — BP 125/78

## 2022-09-24 DIAGNOSIS — E119 Type 2 diabetes mellitus without complications: Secondary | ICD-10-CM

## 2022-09-24 NOTE — Progress Notes (Signed)
    09/24/2022 Name: Roy [name] MRN: [id number] DOB: 1971-09-03   S:  75 yoM Presents for diabetes evaluation, education, and management Patient was referred and last seen by   Insurance coverage/medication affordability: [place]  Patient reports adherence with medications. Current diabetes medications include: trulicity  Current hypertension medications include: coreg Goal 130/80 Current hyperlipidemia medications include: rosuvastatin, gemfib   Patient denies hypoglycemic events.   Patient reported dietary habits: Eats 3  meals/day Discussed meal planning options and Plate method for healthy eating Avoid sugary drinks and desserts Incorporate balanced protein, non starchy veggies, 1 serving of carbohydrate with each meal Increase water intake Increase physical activity as able  Patient-reported exercise habits: encouraged  O:  Lab Results  Component Value Date   HGBA1C 7.0 (H) 09/05/2022   Lipid Panel     Component Value Date/Time   CHOL 122 09/05/2022 0903   TRIG 232 (H) 09/05/2022 0903   TRIG 173 (H) 11/17/2014 0828   HDL 39 (L) 09/05/2022 0903   HDL 35 (L) 11/17/2014 0828   CHOLHDL 3.1 09/05/2022 0903   LDLCALC 46 09/05/2022 0903     Home fasting blood sugars: doesn't have meter  2 hour post-meal/random blood sugars: n/a.    Clinical Atherosclerotic Cardiovascular Disease (ASCVD): No   The ASCVD Risk score (Arnett DK, et al., 2019) failed to calculate for the following reasons:   The valid total cholesterol range is 130 to 320 mg/dL    A/P:  Diabetes T2DM currently controlled (A1c 7%).  Patient is compliant with medications.  We discussed healthy diet in great detail.  He has made improvements in diet.   Healthy plate method discussed.  Continue current medications.  Increase water intake.  Start to exercise as able. Denies personal and family history of Medullary thyroid cancer (MTC).  Recommended Relion brand glucometer at [facility] to start  checking blood sugars in the morning.   -Extensively discussed pathophysiology of diabetes, recommended lifestyle interventions, dietary effects on blood sugar control  -Next A1C anticipated 6 months.   Written patient instructions provided.  Total time in face to face counseling 30 minutes.     Regina Eck, PharmD, BCPS, BCACP Clinical Pharmacist, [place]  II  T [phone]

## 2022-09-26 ENCOUNTER — Other Ambulatory Visit: Payer: Self-pay | Admitting: Family Medicine

## 2022-09-26 DIAGNOSIS — J418 Mixed simple and mucopurulent chronic bronchitis: Secondary | ICD-10-CM

## 2022-10-09 ENCOUNTER — Encounter: Payer: Self-pay | Admitting: Pharmacist

## 2022-10-16 ENCOUNTER — Ambulatory Visit (INDEPENDENT_AMBULATORY_CARE_PROVIDER_SITE_OTHER): Payer: Medicare HMO | Admitting: Family Medicine

## 2022-10-16 ENCOUNTER — Encounter: Payer: Self-pay | Admitting: Family Medicine

## 2022-10-16 VITALS — BP 113/68 | HR 70 | Temp 97.2°F | Ht 68.0 in | Wt 339.0 lb

## 2022-10-16 DIAGNOSIS — E119 Type 2 diabetes mellitus without complications: Secondary | ICD-10-CM | POA: Diagnosis not present

## 2022-10-16 MED ORDER — BLOOD GLUCOSE TEST VI STRP: 1.0000 | ORAL_STRIP | Freq: Three times a day (TID) | 10 refills | Status: DC

## 2022-10-16 MED ORDER — LANCET DEVICE MISC: 1.0000 | Freq: Three times a day (TID) | 0 refills | Status: AC

## 2022-10-16 MED ORDER — LANCETS MISC. MISC: 1.0000 | Freq: Three times a day (TID) | 10 refills | Status: AC

## 2022-10-16 MED ORDER — BLOOD GLUCOSE MONITORING SUPPL DEVI: 1.0000 | Freq: Three times a day (TID) | 0 refills | Status: AC

## 2022-10-16 NOTE — Progress Notes (Signed)
Subjective:  Patient ID: Roy [name], male    DOB: November 24, 1970  Age: 52 y.o. MRN: [id number]  CC: Follow-up and Transient Ischemic Attack   HPI Roy [name] presents forFollow-up of diabetes.In for education on monitoring. Recent Dx of DM with glucose climbing to 7.0.  He recently saw Roy [name] for education on diet.  He has questions about bread in particular.  How much she can have etc. also if keto bread is acceptable.  Patient denies symptoms such as polyuria, polydipsia, excessive hunger, nausea No significant hypoglycemic spells noted. Medications reviewed. Pt reports taking them regularly without complication/adverse reaction being reported today.    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   He has a past surgical history that includes Rotator cuff repair (Right); Biceps tendon repair (Right); Neck surgery; and Knee arthroscopy with lateral menisectomy (Left, 10/09/2021).   His family history includes Arrhythmia in his father; Asthma in his brother and daughter; Bipolar disorder in his brother; COPD in his father; Fibromyalgia in his sister; Hypertension in his mother; Stroke (age of onset: 64) in his mother.He reports that he has been smoking cigarettes. He has a 8.00 pack-year smoking history. He quit smokeless tobacco use about 3 years ago.  His smokeless tobacco use included snuff and chew. He reports that he does not drink alcohol and does not use drugs.  Current Outpatient Medications on File Prior to Visit  Medication Sig Dispense Refill   acetaminophen (TYLENOL) 500 MG tablet Take 1,000 mg by mouth every 6 (six) hours as needed for headache (pain).      albuterol (VENTOLIN HFA) 108 (90 Base) MCG/ACT inhaler Inhale 1-2 puffs into the  lungs every 6 (six) hours as needed for wheezing or shortness of breath. 18 g 5   ALPRAZolam (XANAX) 0.5 MG tablet Take 1 tablet (0.5 mg total) by mouth 3 (three) times daily as needed. for anxiety 90 tablet 5   Ascorbic Acid (VITAMIN C) 1000 MG tablet Take 1,000 mg by mouth daily.     carvedilol (COREG) 6.25 MG tablet Take 1 tablet (6.25 mg total) by mouth 2 (two) times daily with a meal. 180 tablet 3   DEXILANT 60 MG capsule Take 1 capsule (60 mg total) by mouth daily. 90 capsule 3   docusate sodium (COLACE) 100 MG capsule Take 100 mg by mouth daily.     Dulaglutide (TRULICITY) 3 DJ/2.4QA SOPN Inject 3 mg as directed once a week. 2 mL 2   DULoxetine (CYMBALTA) 60 MG capsule Take 2 capsules (120 mg total) by mouth daily. 180 capsule 3   fluticasone (FLONASE) 50 MCG/ACT nasal spray SPRAY 2 SPRAYS INTO EACH NOSTRIL EVERY DAY 48 mL 1   gemfibrozil (LOPID) 600 MG tablet Take 1 tablet (600 mg total) by mouth 2 (two) times daily. 180 tablet 3   levothyroxine (SYNTHROID) 50 MCG tablet Take 1 tablet (50 mcg total) by mouth daily. 90 tablet 3   linaclotide (LINZESS) 290 MCG CAPS capsule Take 1 capsule (290 mcg total) by mouth daily as needed (constipation). 90 capsule 3   QUEtiapine (SEROQUEL) 200 MG tablet TAKE 1 TABLET BY MOUTH AT BEDTIME FOR  NERVES 90 tablet 3   rosuvastatin (CRESTOR) 5 MG tablet Take 1 tablet (5 mg total) by mouth daily. For cholesterol 90 tablet 3   sildenafil (REVATIO)  20 MG tablet Take 2-5 tablets daily as needed for sex 180 tablet 1   SYMBICORT 160-4.5 MCG/ACT inhaler INHALE 2 PUFFS BY MOUTH TWICE DAILY FOR ASTHMA 11 g 0   traZODone (DESYREL) 100 MG tablet Take 1 tablet (100 mg total) by mouth at bedtime. 90 tablet 3   Vitamin D, Ergocalciferol, (DRISDOL) 1.25 MG (50000 UNIT) CAPS capsule Take 1 capsule (50,000 Units total) by mouth every 7 (seven) days. 13 capsule 3   zinc gluconate 50 MG tablet Take 50 mg by mouth daily.     [DISCONTINUED] famotidine (PEPCID) 20 MG tablet Take  1 tablet (20 mg total) by mouth 2 (two) times daily for 14 days. 28 tablet 0   [DISCONTINUED] Testosterone (ANDROGEL PUMP) 20.25 MG/ACT (1.62%) GEL Apply 4 pumps daily to upper chest and shoulders (Patient not taking: Reported on 06/02/2020) 300 g 5   No current facility-administered medications on file prior to visit.    ROS Review of Systems  Constitutional:  Negative for fever.  Respiratory:  Negative for shortness of breath.   Cardiovascular:  Negative for chest pain.  Musculoskeletal:  Negative for arthralgias.  Skin:  Negative for rash.    Objective:  BP 113/68   Pulse 70   Temp (!) 97.2 F (36.2 C)   Ht 5\' 8"  (1.727 m)   Wt (!) 339 lb (153.8 kg)   SpO2 96%   BMI 51.54 kg/m   BP Readings from Last 3 Encounters:  10/16/22 113/68  10/09/22 125/78  09/07/22 127/82    Wt Readings from Last 3 Encounters:  10/16/22 (!) 339 lb (153.8 kg)  09/07/22 (!) 345 lb (156.5 kg)  09/05/22 (!) 348 lb 3.2 oz (157.9 kg)     Physical Exam Vitals reviewed.  Constitutional:      Appearance: He is well-developed.  HENT:     Head: Normocephalic and atraumatic.     Right Ear: External ear normal.     Left Ear: External ear normal.     Mouth/Throat:     Pharynx: No oropharyngeal exudate or posterior oropharyngeal erythema.  Eyes:     Pupils: Pupils are equal, round, and reactive to light.  Cardiovascular:     Rate and Rhythm: Normal rate and regular rhythm.     Heart sounds: No murmur heard. Pulmonary:     Effort: No respiratory distress.     Breath sounds: Normal breath sounds.  Musculoskeletal:     Cervical back: Normal range of motion and neck supple.  Neurological:     Mental Status: He is alert and oriented to person, place, and time.       Assessment & Plan:   Roy [name] was seen today for follow-up and transient ischemic attack.  Diagnoses and all orders for this visit:  New onset type 2 diabetes mellitus (HCC)  Other orders -     Blood Glucose Monitoring  Suppl DEVI; 1 each by Does not apply route in the morning, at noon, and at bedtime. May substitute to any manufacturer covered by patient's insurance. -     Glucose Blood (BLOOD GLUCOSE TEST STRIPS) STRP; 1 each by In Vitro route in the morning, at noon, and at bedtime. May substitute to any manufacturer covered by patient's insurance. -     Lancet Device MISC; 1 each by Does not apply route in the morning, at noon, and at bedtime. May substitute to any manufacturer covered by patient's insurance. -     Lancets Misc. MISC; 1 each by Does  not apply route in the morning, at noon, and at bedtime. May substitute to any manufacturer covered by patient's insurance.      I have discontinued Ara Mano. Olkowski's diclofenac. I am also having him start on Blood Glucose Monitoring Suppl, BLOOD GLUCOSE TEST STRIPS, Lancet Device, and Lancets Misc.. Additionally, I am having him maintain his acetaminophen, sildenafil, vitamin C, zinc gluconate, DULoxetine, Dexilant, gemfibrozil, levothyroxine, linaclotide, QUEtiapine, docusate sodium, fluticasone, Trulicity, albuterol, ALPRAZolam, carvedilol, rosuvastatin, traZODone, Vitamin D (Ergocalciferol), and Symbicort.  Meds ordered this encounter  Medications   Blood Glucose Monitoring Suppl DEVI    Sig: 1 each by Does not apply route in the morning, at noon, and at bedtime. May substitute to any manufacturer covered by patient's insurance.    Dispense:  1 each    Refill:  0   Glucose Blood (BLOOD GLUCOSE TEST STRIPS) STRP    Sig: 1 each by In Vitro route in the morning, at noon, and at bedtime. May substitute to any manufacturer covered by patient's insurance.    Dispense:  100 strip    Refill:  10   Lancet Device MISC    Sig: 1 each by Does not apply route in the morning, at noon, and at bedtime. May substitute to any manufacturer covered by patient's insurance.    Dispense:  1 each    Refill:  0   Lancets Misc. MISC    Sig: 1 each by Does not apply route in the  morning, at noon, and at bedtime. May substitute to any manufacturer covered by patient's insurance.    Dispense:  100 each    Refill:  10     Follow-up: Return in about 2 months (around 12/15/2022).  Claretta Fraise, M.D.

## 2022-10-24 ENCOUNTER — Other Ambulatory Visit: Payer: Self-pay | Admitting: Family Medicine

## 2022-10-24 DIAGNOSIS — I10 Essential (primary) hypertension: Secondary | ICD-10-CM

## 2022-10-25 ENCOUNTER — Other Ambulatory Visit: Payer: Self-pay | Admitting: Family Medicine

## 2022-10-25 DIAGNOSIS — J418 Mixed simple and mucopurulent chronic bronchitis: Secondary | ICD-10-CM

## 2022-11-10 ENCOUNTER — Encounter: Payer: Self-pay | Admitting: Family Medicine

## 2022-11-11 ENCOUNTER — Other Ambulatory Visit: Payer: Self-pay | Admitting: Family Medicine

## 2022-11-11 ENCOUNTER — Other Ambulatory Visit: Payer: Self-pay | Admitting: *Deleted

## 2022-11-11 ENCOUNTER — Encounter: Payer: Self-pay | Admitting: Family Medicine

## 2022-11-11 MED ORDER — BLOOD GLUCOSE TEST VI STRP: 1.0000 | ORAL_STRIP | Freq: Three times a day (TID) | 10 refills | Status: AC

## 2022-11-21 ENCOUNTER — Encounter: Payer: Self-pay | Admitting: Radiology

## 2022-12-01 ENCOUNTER — Other Ambulatory Visit: Payer: Self-pay | Admitting: Orthopedic Surgery

## 2022-12-01 ENCOUNTER — Other Ambulatory Visit: Payer: Self-pay | Admitting: Family Medicine

## 2022-12-01 DIAGNOSIS — G8929 Other chronic pain: Secondary | ICD-10-CM

## 2022-12-16 ENCOUNTER — Ambulatory Visit (INDEPENDENT_AMBULATORY_CARE_PROVIDER_SITE_OTHER): Payer: Medicare HMO | Admitting: Family Medicine

## 2022-12-16 ENCOUNTER — Encounter: Payer: Self-pay | Admitting: Family Medicine

## 2022-12-16 VITALS — BP 126/74 | HR 66 | Temp 97.9°F | Ht 68.0 in | Wt 335.2 lb

## 2022-12-16 DIAGNOSIS — E119 Type 2 diabetes mellitus without complications: Secondary | ICD-10-CM

## 2022-12-16 DIAGNOSIS — E039 Hypothyroidism, unspecified: Secondary | ICD-10-CM

## 2022-12-16 DIAGNOSIS — E349 Endocrine disorder, unspecified: Secondary | ICD-10-CM | POA: Diagnosis not present

## 2022-12-16 DIAGNOSIS — I10 Essential (primary) hypertension: Secondary | ICD-10-CM | POA: Diagnosis not present

## 2022-12-16 DIAGNOSIS — E782 Mixed hyperlipidemia: Secondary | ICD-10-CM | POA: Diagnosis not present

## 2022-12-16 DIAGNOSIS — Z7985 Long-term (current) use of injectable non-insulin antidiabetic drugs: Secondary | ICD-10-CM | POA: Diagnosis not present

## 2022-12-16 LAB — BAYER DCA HB A1C WAIVED: HB A1C (BAYER DCA - WAIVED): 5.7 % — ABNORMAL HIGH (ref 4.8–5.6)

## 2022-12-16 NOTE — Progress Notes (Signed)
Subjective:  Patient ID: Roy [name],  male    DOB: [birth year]  Age: 52 y.o.    CC: Medical Management of Chronic Issues   HPI Roy [name] presents for  follow-up of hypertension. Patient has no history of headache chest pain or shortness of breath or recent cough. Patient also denies symptoms of TIA such as numbness weakness lateralizing. Patient denies side effects from medication. States taking it regularly.  Patient also  in for follow-up of elevated cholesterol. Doing well without complaints on current medication. Denies side effects  including myalgia and arthralgia and nausea. Also in today for liver function testing. Currently no chest pain, shortness of breath or other cardiovascular related symptoms noted.  Follow-up of diabetes. Patient does check blood sugar at home. Readings run between 90 and 130 Patient denies symptoms such as excessive hunger or urinary frequency, excessive hunger, nausea No significant hypoglycemic spells noted. Medications reviewed. Pt reports taking them regularly. Pt. denies complication/adverse reaction today.    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   He has a past surgical history that includes Rotator cuff repair (Right); Biceps tendon repair (Right); Neck surgery; and Knee arthroscopy with lateral menisectomy (Left, 10/09/2021).   His family history includes Arrhythmia in his father; Asthma in his brother and daughter; Bipolar disorder in his brother; COPD in his father; Fibromyalgia in his sister; Hypertension in his mother; Stroke (age of onset: 83) in his mother.He reports that he has been smoking cigarettes. He has a 8.00 pack-year smoking history. He quit smokeless tobacco use about 3 years ago.   His smokeless tobacco use included snuff and chew. He reports that he does not drink alcohol and does not use drugs.  Current Outpatient Medications on File Prior to Visit  Medication Sig Dispense Refill   acetaminophen (TYLENOL) 500 MG tablet Take 1,000 mg by mouth every 6 (six) hours as needed for headache (pain).      albuterol (VENTOLIN HFA) 108 (90 Base) MCG/ACT inhaler Inhale 1-2 puffs into the lungs every 6 (six) hours as needed for wheezing or shortness of breath. 18 g 5   ALPRAZolam (XANAX) 0.5 MG tablet Take 1 tablet (0.5 mg total) by mouth 3 (three) times daily as needed. for anxiety 90 tablet 5   Ascorbic Acid (VITAMIN C) 1000 MG tablet Take 1,000 mg by mouth daily.     Blood Glucose Monitoring Suppl DEVI 1 each by Does not apply route in the morning, at noon, and at bedtime. May substitute to any manufacturer covered by patient's insurance. 1 each 0   carvedilol (COREG) 6.25 MG tablet Take 1 tablet (6.25 mg total) by mouth 2 (two) times daily with a meal. 180 tablet 3   DEXILANT 60 MG capsule Take 1 capsule (60 mg total) by mouth daily. 90 capsule 3   docusate sodium (COLACE) 100 MG capsule Take 100 mg by mouth daily.     Dulaglutide (TRULICITY) 3 0000000 SOPN INJECT 3 MG AS DIRECTED ONCE A WEEK. 2 mL 2   DULoxetine (CYMBALTA) 60 MG capsule Take 2 capsules (120 mg total) by mouth daily. 180 capsule 3   fluticasone (FLONASE) 50 MCG/ACT nasal spray SPRAY 2 SPRAYS INTO EACH NOSTRIL EVERY DAY 48 mL 1   gemfibrozil (LOPID) 600 MG tablet Take 1 tablet (600 mg total) by  mouth 2 (two) times daily. 180 tablet 3   levothyroxine (SYNTHROID) 50 MCG tablet Take 1 tablet (50 mcg total) by mouth daily. 90 tablet 3   linaclotide (LINZESS) 290 MCG CAPS capsule Take 1 capsule (290 mcg total) by mouth daily as needed (constipation). 90 capsule 3   meloxicam (MOBIC) 7.5 MG tablet TAKE 1 TABLET BY MOUTH EVERY DAY 30 tablet 5   QUEtiapine (SEROQUEL) 200 MG tablet TAKE 1 TABLET BY MOUTH AT BEDTIME FOR   NERVES 90 tablet 3   rosuvastatin (CRESTOR) 5 MG tablet Take 1 tablet (5 mg total) by mouth daily. For cholesterol 90 tablet 3   sildenafil (REVATIO) 20 MG tablet Take 2-5 tablets daily as needed for sex 180 tablet 1   SYMBICORT 160-4.5 MCG/ACT inhaler INHALE 2 PUFFS BY MOUTH TWICE DAILY FOR ASTHMA 11 g 1   traZODone (DESYREL) 100 MG tablet Take 1 tablet (100 mg total) by mouth at bedtime. 90 tablet 3   Vitamin D, Ergocalciferol, (DRISDOL) 1.25 MG (50000 UNIT) CAPS capsule Take 1 capsule (50,000 Units total) by mouth every 7 (seven) days. 13 capsule 3   zinc gluconate 50 MG tablet Take 50 mg by mouth daily.     [DISCONTINUED] famotidine (PEPCID) 20 MG tablet Take 1 tablet (20 mg total) by mouth 2 (two) times daily for 14 days. 28 tablet 0   No current facility-administered medications on file prior to visit.    ROS Review of Systems  Constitutional:  Negative for fever.  Respiratory:  Negative for shortness of breath.   Cardiovascular:  Negative for chest pain.  Musculoskeletal:  Negative for arthralgias.  Skin:  Negative for rash.    Objective:  BP 126/74   Pulse 66   Temp 97.9 F (36.6 C)   Ht 5\' 8"  (1.727 m)   Wt (!) 335 lb 3.2 oz (152 kg)   SpO2 95%   BMI 50.97 kg/m   BP Readings from Last 3 Encounters:  12/16/22 126/74  10/16/22 113/68  10/09/22 125/78    Wt Readings from Last 3 Encounters:  12/16/22 (!) 335 lb 3.2 oz (152 kg)  10/16/22 (!) 339 lb (153.8 kg)  09/07/22 (!) 345 lb (156.5 kg)     Physical Exam Vitals reviewed.  Constitutional:      Appearance: He is well-developed.  HENT:     Head: Normocephalic and atraumatic.     Right Ear: External ear normal.     Left Ear: External ear normal.     Mouth/Throat:     Pharynx: No oropharyngeal exudate or posterior oropharyngeal erythema.  Eyes:     Pupils: Pupils are equal, round, and reactive to light.  Cardiovascular:     Rate and Rhythm: Normal rate and regular rhythm.     Heart sounds: No murmur  heard. Pulmonary:     Effort: No respiratory distress.     Breath sounds: Normal breath sounds.  Musculoskeletal:     Cervical back: Normal range of motion and neck supple.  Neurological:     Mental Status: He is alert and oriented to person, place, and time.     Diabetic Foot Exam - Simple   No data filed     Lab Results  Component Value Date   HGBA1C 7.0 (H) 09/05/2022   HGBA1C 6.1 (H) 06/05/2022   HGBA1C 6.3 (H) 01/01/2022    Assessment & Plan:   Akxel was seen today for medical management of chronic issues.  Diagnoses and all orders for this visit:  New onset type 2 diabetes mellitus (HCC) -     Bayer DCA Hb A1c Waived -     Microalbumin / creatinine urine ratio  Benign essential HTN -     CBC with Differential/Platelet -     CMP14+EGFR  Mixed hyperlipidemia -     Lipid panel  Hypothyroidism, unspecified type -     TSH + free T4  Hypotestosteronism -     Testosterone,Free and Total   I have discontinued [name]. Schuchard's Testosterone. I am also having him maintain his acetaminophen, sildenafil, vitamin C, zinc gluconate, DULoxetine, Dexilant, gemfibrozil, levothyroxine, linaclotide, QUEtiapine, docusate sodium, albuterol, ALPRAZolam, carvedilol, rosuvastatin, traZODone, Vitamin D (Ergocalciferol), Blood Glucose Monitoring Suppl, Symbicort, Trulicity, fluticasone, and meloxicam.  No orders of the defined types were placed in this encounter.    Follow-up: Return in about 3 months (around 03/18/2023).  Claretta Fraise, M.D.

## 2022-12-17 ENCOUNTER — Ambulatory Visit (INDEPENDENT_AMBULATORY_CARE_PROVIDER_SITE_OTHER): Payer: Medicare HMO | Admitting: *Deleted

## 2022-12-17 DIAGNOSIS — Z0000 Encounter for general adult medical examination without abnormal findings: Secondary | ICD-10-CM | POA: Diagnosis not present

## 2022-12-17 LAB — CMP14+EGFR
ALT: 15 IU/L (ref 0–44)
AST: 18 IU/L (ref 0–40)
Albumin/Globulin Ratio: 2 (ref 1.2–2.2)
Albumin: 4.4 g/dL (ref 4.1–5.1)
Alkaline Phosphatase: 92 IU/L (ref 44–121)
BUN/Creatinine Ratio: 7 — ABNORMAL LOW (ref 9–20)
BUN: 6 mg/dL (ref 6–24)
Bilirubin Total: <0.2 mg/dL (ref 0.0–1.2)
CO2: 21 mmol/L (ref 20–29)
Calcium: 8.9 mg/dL (ref 8.7–10.2)
Chloride: 102 mmol/L (ref 96–106)
Creatinine, Ser: 0.88 mg/dL (ref 0.76–1.27)
Globulin, Total: 2.2 g/dL (ref 1.5–4.5)
Glucose: 95 mg/dL (ref 70–99)
Potassium: 4.3 mmol/L (ref 3.5–5.2)
Sodium: 140 mmol/L (ref 134–144)
Total Protein: 6.6 g/dL (ref 6.0–8.5)
eGFR: 105 mL/min/{1.73_m2} (ref >59)

## 2022-12-17 LAB — CBC WITH DIFFERENTIAL/PLATELET
Basophils Absolute: 0 10*3/uL (ref 0.0–0.2)
Basos: 0 %
EOS (ABSOLUTE): 0.1 10*3/uL (ref 0.0–0.4)
Eos: 2 %
Hematocrit: 40.6 % (ref 37.5–51.0)
Hemoglobin: 13.3 g/dL (ref 13.0–17.7)
Immature Grans (Abs): 0 10*3/uL (ref 0.0–0.1)
Immature Granulocytes: 0 %
Lymphocytes Absolute: 2.3 10*3/uL (ref 0.7–3.1)
Lymphs: 32 %
MCH: 30.3 pg (ref 26.6–33.0)
MCHC: 32.8 g/dL (ref 31.5–35.7)
MCV: 93 fL (ref 79–97)
Monocytes Absolute: 0.5 10*3/uL (ref 0.1–0.9)
Monocytes: 7 %
Neutrophils Absolute: 4.1 10*3/uL (ref 1.4–7.0)
Neutrophils: 59 %
Platelets: 230 10*3/uL (ref 150–450)
RBC: 4.39 x10E6/uL (ref 4.14–5.80)
RDW: 12.8 % (ref 11.6–15.4)
WBC: 7.1 10*3/uL (ref 3.4–10.8)

## 2022-12-17 LAB — TSH+FREE T4
Free T4: 1.11 ng/dL (ref 0.82–1.77)
TSH: 3.16 u[IU]/mL (ref 0.450–4.500)

## 2022-12-17 LAB — LIPID PANEL
Chol/HDL Ratio: 2.8 ratio (ref 0.0–5.0)
Cholesterol, Total: 113 mg/dL (ref 100–199)
HDL: 40 mg/dL (ref >39)
LDL Chol Calc (NIH): 45 mg/dL (ref 0–99)
Triglycerides: 166 mg/dL — ABNORMAL HIGH (ref 0–149)
VLDL Cholesterol Cal: 28 mg/dL (ref 5–40)

## 2022-12-17 LAB — TESTOSTERONE,FREE AND TOTAL
Testosterone, Free: 3.8 pg/mL — ABNORMAL LOW (ref 6.8–21.5)
Testosterone: 191 ng/dL — ABNORMAL LOW (ref 264–916)

## 2022-12-17 NOTE — Patient Instructions (Signed)

## 2022-12-17 NOTE — Progress Notes (Signed)
MEDICARE ANNUAL WELLNESS VISIT  12/17/2022  Telephone Visit Disclaimer This Medicare AWV was conducted by telephone due to national recommendations for restrictions regarding the COVID-19 Pandemic (e.g. social distancing).  I verified, using two identifiers, that I am speaking with Roy [name] or their authorized healthcare agent. I discussed the limitations, risks, security, and privacy concerns of performing an evaluation and management service by telephone and the potential availability of an in-person appointment in the future. The patient expressed understanding and agreed to proceed.  Location of Patient: Home Location of Provider (nurse):  Office  Subjective:    Roy [name] is a 52 y.o. male patient of Stacks, Cletus Gash, MD who had a Medicare Annual Wellness Visit today via telephone. Roy [name] is Disabled and lives with their brother and daughter. he has 1 child. he reports that he is socially active and does interact with friends/family regularly. he is minimally physically active and enjoys fishing.  Patient Care Team: Claretta Fraise, MD as PCP - General (Family Medicine)     12/17/2022    8:14 AM 09/07/2022   10:35 AM 07/18/2022    8:21 AM 12/27/2021    5:19 PM 10/17/2021    9:23 PM 10/09/2021    9:04 AM 10/04/2021    9:38 AM  Advanced Directives  Does Patient Have a Medical Advance Directive? No No No No No No No  Would patient like information on creating a medical advance directive? No - Patient declined No - Patient declined No - Patient declined No - Patient declined   Yes (MAU/Ambulatory/Procedural Areas - Information given)    Hospital Utilization Over the Past 12 Months: # of hospitalizations or ER visits: 2 # of surgeries: 0  Review of Systems    Patient reports that his overall health is unchanged compared to last year.  History obtained from chart review and the patient  Patient Reported Readings (BP, Pulse, CBG, Weight, etc) none  Pain  Assessment Pain : No/denies pain     Current Medications & Allergies (verified) Allergies as of 12/17/2022       Reactions   Penicillins Rash, Other (See Comments)   Has patient had a PCN reaction causing immediate rash, facial/tongue/throat swelling, SOB or lightheadedness with hypotension: Yes Has patient had a PCN reaction causing severe rash involving mucus membranes or skin necrosis: No Has patient had a PCN reaction that required hospitalization: No Has patient had a PCN reaction occurring within the last 10 years: No If all of the above answers are "NO", then may proceed with Cephalosporin use. Has patient had a PCN reaction causing immediate rash, facial/tongue/throat swelling, SOB or lightheadedness with hypotension: Yes Has patient had a PCN reaction causing severe rash involving mucus membranes or skin necrosis: No Has patient had a PCN reaction that required hospitalization: No Has patient had a PCN reaction occurring within the last 10 years: No If all of the above answers are "NO", then may proceed with Cephalosporin use. unknown   Chantix [varenicline] Other (See Comments)   Severe headache        Medication List        Accurate as of December 17, 2022  8:34 AM. If you have any questions, ask your nurse or doctor.          acetaminophen 500 MG tablet Commonly known as: TYLENOL Take 1,000 mg by mouth every 6 (six) hours as needed for headache (pain).   albuterol 108 (90 Base) MCG/ACT inhaler Commonly known as: VENTOLIN  HFA Inhale 1-2 puffs into the lungs every 6 (six) hours as needed for wheezing or shortness of breath.   ALPRAZolam 0.5 MG tablet Commonly known as: XANAX Take 1 tablet (0.5 mg total) by mouth 3 (three) times daily as needed. for anxiety   Blood Glucose Monitoring Suppl Devi 1 each by Does not apply route in the morning, at noon, and at bedtime. May substitute to any manufacturer covered by patient's insurance.   carvedilol 6.25 MG  tablet Commonly known as: COREG Take 1 tablet (6.25 mg total) by mouth 2 (two) times daily with a meal.   Dexilant 60 MG capsule Generic drug: dexlansoprazole Take 1 capsule (60 mg total) by mouth daily.   docusate sodium 100 MG capsule Commonly known as: COLACE Take 100 mg by mouth daily.   DULoxetine 60 MG capsule Commonly known as: CYMBALTA Take 2 capsules (120 mg total) by mouth daily.   fluticasone 50 MCG/ACT nasal spray Commonly known as: FLONASE SPRAY 2 SPRAYS INTO EACH NOSTRIL EVERY DAY   gemfibrozil 600 MG tablet Commonly known as: LOPID Take 1 tablet (600 mg total) by mouth 2 (two) times daily.   levothyroxine 50 MCG tablet Commonly known as: SYNTHROID Take 1 tablet (50 mcg total) by mouth daily.   linaclotide 290 MCG Caps capsule Commonly known as: LINZESS Take 1 capsule (290 mcg total) by mouth daily as needed (constipation).   meloxicam 7.5 MG tablet Commonly known as: MOBIC TAKE 1 TABLET BY MOUTH EVERY DAY   QUEtiapine 200 MG tablet Commonly known as: SEROQUEL TAKE 1 TABLET BY MOUTH AT BEDTIME FOR  NERVES   rosuvastatin 5 MG tablet Commonly known as: CRESTOR Take 1 tablet (5 mg total) by mouth daily. For cholesterol   sildenafil 20 MG tablet Commonly known as: REVATIO Take 2-5 tablets daily as needed for sex   Symbicort 160-4.5 MCG/ACT inhaler Generic drug: budesonide-formoterol INHALE 2 PUFFS BY MOUTH TWICE DAILY FOR ASTHMA   traZODone 100 MG tablet Commonly known as: DESYREL Take 1 tablet (100 mg total) by mouth at bedtime.   Trulicity 3 0000000 Sopn Generic drug: Dulaglutide INJECT 3 MG AS DIRECTED ONCE A WEEK.   vitamin C 1000 MG tablet Take 1,000 mg by mouth daily.   Vitamin D (Ergocalciferol) 1.25 MG (50000 UNIT) Caps capsule Commonly known as: DRISDOL Take 1 capsule (50,000 Units total) by mouth every 7 (seven) days.   zinc gluconate 50 MG tablet Take 50 mg by mouth daily.        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- no surgery   Depression    Diabetes mellitus without complication ([place])    High cholesterol    Hypertension    Hypothyroidism    Low testosterone    Median nerve dysfunction    Radiculopathy of cervical region    Thyroid disease    Ulnar neuropathy at elbow    Weakness    Past Surgical History:  Procedure Laterality Date   BICEPS TENDON REPAIR Right    KNEE ARTHROSCOPY WITH LATERAL MENISECTOMY Left 10/09/2021   Procedure: KNEE ARTHROSCOPY WITH LATERAL AND MEDIAL MENISCECTOMY;  Surgeon: Carole Civil, MD;  Location: [facility];  Service: Orthopedics;  Laterality: Left;   NECK SURGERY     Fusion done two seperate times   ROTATOR CUFF REPAIR  Right    Family History  Problem Relation Age of Onset   Hypertension Mother    Stroke Mother 57   Arrhythmia Father    COPD Father    Fibromyalgia Sister    Asthma Brother    Bipolar disorder Brother    Asthma Daughter    Social History   Socioeconomic History   Marital status: Divorced    Spouse name: Not on file   Number of children: 1   Years of education: GED   Highest education level: GED or equivalent  Occupational History   Occupation: Disability    Comment: Worked as Community education officer  Tobacco Use   Smoking status: Every Day    Packs/day: 0.50    Years: 33.00    Additional pack years: 0.00    Total pack years: 16.50    Types: Cigarettes   Smokeless tobacco: Former    Types: Snuff, Chew    Quit date: 10/05/2019  Vaping Use   Vaping Use: Never used  Substance and Sexual Activity   Alcohol use: No   Drug use: No   Sexual activity: Yes  Other Topics Concern   Not on file  Social History Narrative   Lives with daughter   Social Determinants of Health   Financial Resource Strain: Low Risk  (12/17/2022)   Overall Financial Resource Strain (CARDIA)    Difficulty of Paying  Living Expenses: Not hard at all  Food Insecurity: No Food Insecurity (12/17/2022)   Hunger Vital Sign    Worried About Running Out of Food in the Last Year: Never true    [place] in the Last Year: Never true  Transportation Needs: No Transportation Needs (12/17/2022)   PRAPARE - Hydrologist (Medical): No    Lack of Transportation (Non-Medical): No  Physical Activity: Inactive (12/17/2022)   Exercise Vital Sign    Days of Exercise per Week: 0 days    Minutes of Exercise per Session: 0 min  Stress: No Stress Concern Present (12/17/2022)   [address]    Feeling of Stress : Not at all  Social Connections: Moderately Integrated (12/17/2022)   Social Connection and Isolation Panel [NHANES]    Frequency of Communication with Friends and Family: More than three times a week    Frequency of Social Gatherings with Friends and Family: More than three times a week    Attends Religious Services: More than 4 times per year    Active Member of [employer] or Organizations: Yes    Attends Archivist Meetings: More than 4 times per year    Marital Status: Divorced    Activities of Daily Living    12/17/2022    8:15 AM 07/18/2022    8:21 AM  In your present state of health, do you have any difficulty performing the following activities:  Hearing? 1 0  Comment pt thinks he is hard of hearing-will speak with Dr Livia Snellen about possible referral to audiology for hearing evaluation   Vision? 1 1  Comment  patient had bifocals but was having difficulty adjusting to seeing with them so he stopped wearing them  Difficulty concentrating or making decisions? 0 0  Walking or climbing stairs? 1 1  Comment due to his knee osteoarthritis left knee pain  Dressing or bathing? 0 0  Doing errands, shopping? 0 0  Preparing Food and eating ? N N  Using the Toilet? N N  In the past six months, have you accidently  leaked urine? N   Do you have problems with loss of bowel control? N N  Managing your Medications? N N  Managing your Finances? N N  Housekeeping or managing your Housekeeping? N N    Patient Education/ Literacy How often do you need to have someone help you when you read instructions, pamphlets, or other written materials from your doctor or pharmacy?: 1 - Never What is the last grade level you completed in school?: 9th Grade  Exercise Current Exercise Habits: The patient does not participate in regular exercise at present, Exercise limited by: orthopedic condition(s)  Diet Patient reports consuming 3 meals a day and 1 snack(s) a day Patient reports that his primary diet is: Regular Patient reports that she does have regular access to food.   Depression Screen    12/17/2022    8:15 AM 12/16/2022    9:00 AM 12/16/2022    8:56 AM 10/16/2022    8:55 AM 10/16/2022    8:48 AM 09/05/2022    8:49 AM 09/05/2022    8:29 AM  PHQ 2/9 Scores  PHQ - 2 Score 0 2 0 2 0 2 0  PHQ- 9 Score  5  3  5       Fall Risk    12/17/2022    8:15 AM 12/16/2022    8:56 AM 10/16/2022    8:48 AM 09/05/2022    8:29 AM 07/24/2022    2:10 PM  Fall Risk   Falls in the past year? 0 0 0 0 0     Objective:  Roy [name] seemed alert and oriented and he participated appropriately during our telephone visit.  Blood Pressure Weight BMI  BP Readings from Last 3 Encounters:  12/16/22 126/74  10/16/22 113/68  10/09/22 125/78   Wt Readings from Last 3 Encounters:  12/16/22 (!) 335 lb 3.2 oz (152 kg)  10/16/22 (!) 339 lb (153.8 kg)  09/07/22 (!) 345 lb (156.5 kg)   BMI Readings from Last 1 Encounters:  12/16/22 50.97 kg/m    *Unable to obtain current vital signs, weight, and BMI due to telephone visit type  Hearing/Vision  Roy [name] did not seem to have difficulty with hearing/understanding during the telephone conversation Reports that he has not had a formal eye exam by an eye care professional within  the past year Reports that he has not had a formal hearing evaluation within the past year *Unable to fully assess hearing and vision during telephone visit type  Cognitive Function:    12/17/2022    8:20 AM 07/18/2022    8:23 AM  6CIT Screen  What Year? 0 points 0 points  What month? 0 points 0 points  What time? 0 points 0 points  Count back from 20 0 points 0 points  Months in reverse 4 points 0 points  Repeat phrase 0 points 0 points  Total Score 4 points 0 points   (Normal:0-7, Significant for Dysfunction: >8)  Normal Cognitive Function Screening: Yes   Immunization & Health Maintenance Record Immunization History  Administered Date(s) Administered   Dtap, Unspecified 07/20/1973, 10/05/1973, 01/12/1974, 12/15/1974   Influenza,inj,Quad PF,6+ Mos 07/01/2018   Influenza-Unspecified 07/01/2018   MMR 07/02/1974   Polio, Unspecified 07/20/1973, 10/05/1973, 01/12/1974, 12/15/1974   Tdap 11/16/2014    Health Maintenance  Topic Date Due   Diabetic kidney evaluation - Urine ACR  Never done   COLONOSCOPY (Pts 45-47yrs Insurance coverage will need to be confirmed)  Never done   INFLUENZA VACCINE  12/22/2022 (Originally 04/23/2022)   COVID-19 Vaccine (1) 01/01/2023 (Originally 05/24/1978)   Hepatitis C Screening  02/06/2023 (Originally 05/25/1991)   Medicare Annual Wellness (AWV)  07/19/2023   Diabetic kidney evaluation - eGFR measurement  12/16/2023   DTaP/Tdap/Td (6 - Td or Tdap) 11/16/2024   HIV Screening  Completed   HPV VACCINES  Aged Out       Assessment  This is a routine wellness examination for Roy [name].  Health Maintenance: Due or Overdue Health Maintenance Due  Topic Date Due   Diabetic kidney evaluation - Urine ACR  Never done   COLONOSCOPY (Pts 45-61yrs Insurance coverage will need to be confirmed)  Never done    Roy [name] does not need a referral for Community Assistance: Care Management:   no Social Work:    no Prescription  Assistance:  no Nutrition/Diabetes Education:  no   Plan:  Personalized Goals  Goals Addressed               This Visit's Progress     DIET - REDUCE SUGAR INTAKE (pt-stated)        To help lower A1C       Personalized Health Maintenance & Screening Recommendations  Colorectal cancer screening Advanced directives: has NO advanced directive - not interested in additional information Diabetic Eye Exam  Lung Cancer Screening Recommended: No (Low Dose CT Chest recommended if Age 35-80 years, 30 pack-year currently smoking OR have quit w/in past 15 years) Hepatitis C Screening recommended: No HIV Screening recommended: No  Advanced Directives: Written information was not prepared per patient's request.  Referrals & Orders No orders of the defined types were placed in this encounter.   Follow-up Plan Follow-up with Claretta Fraise, MD as planned Schedule your Diabetic Eye Exam  Consider Colon Cancer screening as discussed Consider discussing Referral to Audiology for Hearing Evaluation as discussed   I have personally reviewed and noted the following in the patient's chart:   Medical and social history Use of alcohol, tobacco or illicit drugs  Current medications and supplements Functional ability and status Nutritional status Physical activity Advanced directives List of other physicians Hospitalizations, surgeries, and ER visits in previous 12 months Vitals Screenings to include cognitive, depression, and falls Referrals and appointments  In addition, I have reviewed and discussed with Roy [name] certain preventive protocols, quality metrics, and best practice recommendations. A written personalized care plan for preventive services as well as general preventive health recommendations is available and can be mailed to the patient at his request.      Milas Hock, LPN  [phone]

## 2022-12-18 LAB — MICROALBUMIN / CREATININE URINE RATIO
Creatinine, Urine: 158.3 mg/dL
Microalb/Creat Ratio: 4 mg/g creat (ref 0–29)
Microalbumin, Urine: 6 ug/mL

## 2022-12-19 ENCOUNTER — Emergency Department (HOSPITAL_COMMUNITY): Payer: Medicare HMO

## 2022-12-19 ENCOUNTER — Other Ambulatory Visit: Payer: Self-pay

## 2022-12-19 ENCOUNTER — Encounter (HOSPITAL_COMMUNITY): Payer: Self-pay

## 2022-12-19 ENCOUNTER — Emergency Department (HOSPITAL_COMMUNITY)
Admission: EM | Admit: 2022-12-19 | Discharge: 2022-12-19 | Disposition: A | Discharge status: Home / Self Care | Payer: Medicare HMO | Attending: Emergency Medicine | Admitting: Emergency Medicine

## 2022-12-19 DIAGNOSIS — K2900 Acute gastritis without bleeding: Secondary | ICD-10-CM | POA: Diagnosis not present

## 2022-12-19 DIAGNOSIS — Z794 Long term (current) use of insulin: Secondary | ICD-10-CM | POA: Insufficient documentation

## 2022-12-19 DIAGNOSIS — R109 Unspecified abdominal pain: Secondary | ICD-10-CM | POA: Diagnosis not present

## 2022-12-19 DIAGNOSIS — E119 Type 2 diabetes mellitus without complications: Secondary | ICD-10-CM | POA: Insufficient documentation

## 2022-12-19 DIAGNOSIS — R0602 Shortness of breath: Secondary | ICD-10-CM | POA: Diagnosis not present

## 2022-12-19 DIAGNOSIS — Z7984 Long term (current) use of oral hypoglycemic drugs: Secondary | ICD-10-CM | POA: Insufficient documentation

## 2022-12-19 DIAGNOSIS — R0789 Other chest pain: Secondary | ICD-10-CM | POA: Diagnosis not present

## 2022-12-19 DIAGNOSIS — I700 Atherosclerosis of aorta: Secondary | ICD-10-CM | POA: Diagnosis not present

## 2022-12-19 DIAGNOSIS — R079 Chest pain, unspecified: Secondary | ICD-10-CM | POA: Diagnosis not present

## 2022-12-19 DIAGNOSIS — K219 Gastro-esophageal reflux disease without esophagitis: Secondary | ICD-10-CM

## 2022-12-19 DIAGNOSIS — R110 Nausea: Secondary | ICD-10-CM | POA: Diagnosis not present

## 2022-12-19 LAB — COMPREHENSIVE METABOLIC PANEL
ALT: 18 U/L (ref 0–44)
AST: 16 U/L (ref 15–41)
Albumin: 4.1 g/dL (ref 3.5–5.0)
Alkaline Phosphatase: 78 U/L (ref 38–126)
Anion gap: 9 (ref 5–15)
BUN: 10 mg/dL (ref 6–20)
CO2: 25 mmol/L (ref 22–32)
Calcium: 9.8 mg/dL (ref 8.9–10.3)
Chloride: 101 mmol/L (ref 98–111)
Creatinine, Ser: 0.7 mg/dL (ref 0.61–1.24)
GFR, Estimated: >60 mL/min (ref >60)
Glucose, Bld: 118 mg/dL — ABNORMAL HIGH (ref 70–99)
Potassium: 3.6 mmol/L (ref 3.5–5.1)
Sodium: 135 mmol/L (ref 135–145)
Total Bilirubin: 0.6 mg/dL (ref 0.3–1.2)
Total Protein: 7.3 g/dL (ref 6.5–8.1)

## 2022-12-19 LAB — CBC
HCT: 42.4 % (ref 39.0–52.0)
Hemoglobin: 14.2 g/dL (ref 13.0–17.0)
MCH: 30.9 pg (ref 26.0–34.0)
MCHC: 33.5 g/dL (ref 30.0–36.0)
MCV: 92.4 fL (ref 80.0–100.0)
Platelets: 216 10*3/uL (ref 150–400)
RBC: 4.59 MIL/uL (ref 4.22–5.81)
RDW: 13.2 % (ref 11.5–15.5)
WBC: 8.4 10*3/uL (ref 4.0–10.5)
nRBC: 0 % (ref 0.0–0.2)

## 2022-12-19 LAB — TROPONIN I (HIGH SENSITIVITY)
Troponin I (High Sensitivity): 2 ng/L (ref <18)
Troponin I (High Sensitivity): 2 ng/L (ref <18)

## 2022-12-19 LAB — LIPASE, BLOOD: Lipase: 59 U/L — ABNORMAL HIGH (ref 11–51)

## 2022-12-19 MED ORDER — LIDOCAINE VISCOUS HCL 2 % MT SOLN
15.0000 mL | Freq: Once | OROMUCOSAL | Status: AC
  Administered 2022-12-19: 15 mL via ORAL
  Filled 2022-12-19: qty 15

## 2022-12-19 MED ORDER — ONDANSETRON HCL 4 MG/2ML IJ SOLN
4.0000 mg | Freq: Once | INTRAMUSCULAR | Status: AC
  Administered 2022-12-19: 4 mg via INTRAVENOUS
  Filled 2022-12-19: qty 2

## 2022-12-19 MED ORDER — SUCRALFATE 1 G PO TABS: 1.0000 g | ORAL_TABLET | Freq: Three times a day (TID) | ORAL | 0 refills | Status: DC

## 2022-12-19 MED ORDER — ONDANSETRON HCL 4 MG PO TABS: 4.0000 mg | ORAL_TABLET | Freq: Three times a day (TID) | ORAL | 0 refills | Status: AC | PRN

## 2022-12-19 MED ORDER — ALUM & MAG HYDROXIDE-SIMETH 200-200-20 MG/5ML PO SUSP
30.0000 mL | Freq: Once | ORAL | Status: AC
  Administered 2022-12-19: 30 mL via ORAL
  Filled 2022-12-19: qty 30

## 2022-12-19 MED ORDER — IOHEXOL 300 MG/ML  SOLN
100.0000 mL | Freq: Once | INTRAMUSCULAR | Status: AC | PRN
  Administered 2022-12-19: 100 mL via INTRAVENOUS

## 2022-12-19 NOTE — ED Provider Notes (Signed)
[facility] Provider Note   CSN: [id number] Arrival date & time: 12/19/22  1622     History  Chief Complaint  Patient presents with   Chest Pain    Roy [name] is a 52 y.o. male who presents emergency department chief complaint of left-sided chest pain and vomiting.  He has a past medical history of 2 diabetes, chronic headache, GERD.  Patient reports that last night he ate spaghetti.  Shortly after that he began having severe gas with belching and farting that would not stop.  Patient states he was finally able to get to bed but woke up about 3:00 in the morning vomiting.  He complains of pain in the epigastric region and across the left side of his chest that goes to his back.  He denies hematemesis, hematochezia, coffee-ground emesis, melena.  He denies any current nausea or diaphoresis and denies shortness of breath.   Chest Pain      Home Medications Prior to Admission medications   Medication Sig Start Date End Date Taking? Authorizing Provider  acetaminophen (TYLENOL) 500 MG tablet Take 1,000 mg by mouth every 6 (six) hours as needed for headache (pain).    Yes [provider]  albuterol (VENTOLIN HFA) 108 (90 Base) MCG/ACT inhaler Inhale 1-2 puffs into the lungs every 6 (six) hours as needed for wheezing or shortness of breath. 09/05/22  Yes Stacks, Cletus Gash, MD  ALPRAZolam Duanne Moron) 0.5 MG tablet Take 1 tablet (0.5 mg total) by mouth 3 (three) times daily as needed. for anxiety 09/05/22  Yes Stacks, Cletus Gash, MD  Ascorbic Acid (VITAMIN C) 1000 MG tablet Take 1,000 mg by mouth daily.   Yes [provider]  carvedilol (COREG) 6.25 MG tablet Take 1 tablet (6.25 mg total) by mouth 2 (two) times daily with a meal. 09/05/22  Yes Stacks, Cletus Gash, MD  DEXILANT 60 MG capsule Take 1 capsule (60 mg total) by mouth daily. 06/05/22  Yes Stacks, Cletus Gash, MD  docusate sodium (COLACE) 100 MG capsule Take 100 mg by mouth daily.   Yes  [provider]  Dulaglutide (TRULICITY) 3 0000000 SOPN INJECT 3 MG AS DIRECTED ONCE A WEEK. 11/11/22  Yes Claretta Fraise, MD  DULoxetine (CYMBALTA) 60 MG capsule Take 2 capsules (120 mg total) by mouth daily. 03/05/22  Yes Stacks, Cletus Gash, MD  fluticasone (FLONASE) 50 MCG/ACT nasal spray SPRAY 2 SPRAYS INTO EACH NOSTRIL EVERY DAY Patient taking differently: Place 2 sprays into both nostrils daily. 12/01/22  Yes Claretta Fraise, MD  gemfibrozil (LOPID) 600 MG tablet Take 1 tablet (600 mg total) by mouth 2 (two) times daily. 06/05/22  Yes Stacks, Cletus Gash, MD  levothyroxine (SYNTHROID) 50 MCG tablet Take 1 tablet (50 mcg total) by mouth daily. 06/05/22  Yes Stacks, Cletus Gash, MD  linaclotide Rolan Lipa) 290 MCG CAPS capsule Take 1 capsule (290 mcg total) by mouth daily as needed (constipation). 06/05/22  Yes Claretta Fraise, MD  ondansetron (ZOFRAN) 4 MG tablet Take 1 tablet (4 mg total) by mouth every 8 (eight) hours as needed for nausea or vomiting. 12/19/22  Yes Maxmilian Trostel, PA-C  QUEtiapine (SEROQUEL) 200 MG tablet TAKE 1 TABLET BY MOUTH AT BEDTIME FOR  NERVES 06/05/22  Yes Stacks, Cletus Gash, MD  rosuvastatin (CRESTOR) 5 MG tablet Take 1 tablet (5 mg total) by mouth daily. For cholesterol 09/05/22  Yes Stacks, Cletus Gash, MD  sildenafil (REVATIO) 20 MG tablet Take 2-5 tablets daily as needed for sex 12/07/19  Yes Claretta Fraise, MD  sucralfate (CARAFATE) 1 g tablet Take 1 tablet (1 g total) by mouth 4 (four) times daily -  with meals and at bedtime. 12/19/22  Yes Myrel Rappleye, PA-C  SYMBICORT 160-4.5 MCG/ACT inhaler INHALE 2 PUFFS BY MOUTH TWICE DAILY FOR ASTHMA Patient taking differently: Inhale 2 puffs into the lungs in the morning and at bedtime.  FOR ASTHMA 10/25/22  Yes Claretta Fraise, MD  traZODone (DESYREL) 100 MG tablet Take 1 tablet (100 mg total) by mouth at bedtime. 09/05/22  Yes Stacks, Cletus Gash, MD  Vitamin D, Ergocalciferol, (DRISDOL) 1.25 MG (50000 UNIT) CAPS capsule Take 1 capsule (50,000 Units  total) by mouth every 7 (seven) days. 09/05/22 09/04/23 Yes StacksCletus Gash, MD  zinc gluconate 50 MG tablet Take 50 mg by mouth daily.   Yes [provider]  Blood Glucose Monitoring Suppl DEVI 1 each by Does not apply route in the morning, at noon, and at bedtime. May substitute to any manufacturer covered by patient's insurance. 10/16/22   Claretta Fraise, MD  meloxicam (MOBIC) 7.5 MG tablet TAKE 1 TABLET BY MOUTH EVERY DAY Patient not taking: Reported on 12/19/2022 12/02/22   Carole Civil, MD  famotidine (PEPCID) 20 MG tablet Take 1 tablet (20 mg total) by mouth 2 (two) times daily for 14 days. 06/09/19 06/28/20  Claretta Fraise, MD      Allergies    Penicillins and Chantix [varenicline]    Review of Systems   Review of Systems  Cardiovascular:  Positive for chest pain.    Physical Exam Updated Vital Signs BP 121/68 (BP Location: Left Arm)   Pulse 65   Temp 98.1 F (36.7 C) (Oral)   Resp 16   Ht 5\' 8"  (1.727 m)   Wt (!) 145.2 kg   SpO2 94%   BMI 48.66 kg/m  Physical Exam Physical Exam  Nursing note and vitals reviewed. Constitutional: He appears well-developed and well-nourished. No distress.  HENT:  Head: Normocephalic and atraumatic.  Eyes: Conjunctivae normal are normal. No scleral icterus.  Neck: Normal range of motion. Neck supple.  Cardiovascular: Normal rate, regular rhythm and normal heart sounds.   Pulmonary/Chest: Effort normal and breath sounds normal. No respiratory distress.  Abdominal: Soft.  Mild epigastric tenderness musculoskeletal: He exhibits no edema.  Neurological: He is alert.  Skin: Skin is warm and dry. He is not diaphoretic.  Psychiatric: His behavior is normal.   ED Results / Procedures / Treatments   Labs (all labs ordered are listed, but only abnormal results are displayed) Labs Reviewed  COMPREHENSIVE METABOLIC PANEL - Abnormal; Notable for the following components:      Result Value   Glucose, Bld 118 (*)    All other  components within normal limits  LIPASE, BLOOD - Abnormal; Notable for the following components:   Lipase 59 (*)    All other components within normal limits  CBC  TROPONIN I (HIGH SENSITIVITY)  TROPONIN I (HIGH SENSITIVITY)    EKG  EKG Interpretation  Date/Time:  Thursday December 19 2022 16:31:19 EDT Ventricular Rate:  74 PR Interval:  146 QRS Duration: 94 QT Interval:  393 QTC Calculation: 436 R Axis:   -7 Text Interpretation: Sinus rhythm Confirmed by Milton Ferguson [phone]) on 12/19/2022 8:42:17 PM         Radiology CT ABDOMEN PELVIS W CONTRAST  Result Date: 12/19/2022 CLINICAL DATA:  Acute abdominal pain and left chest pain, initial encounter EXAM: CT ABDOMEN AND PELVIS WITH CONTRAST TECHNIQUE: Multidetector CT imaging of the abdomen and  pelvis was performed using the standard protocol following bolus administration of intravenous contrast. RADIATION DOSE REDUCTION: This exam was performed according to the departmental dose-optimization program which includes automated exposure control, adjustment of the mA and/or kV according to patient size and/or use of iterative reconstruction technique. CONTRAST:  166mL OMNIPAQUE IOHEXOL 300 MG/ML  SOLN COMPARISON:  None Available. FINDINGS: Lower chest: No acute abnormality. Hepatobiliary: No focal liver abnormality is seen. No gallstones, gallbladder wall thickening, or biliary dilatation. Pancreas: Unremarkable. No pancreatic ductal dilatation or surrounding inflammatory changes. Spleen: Normal in size without focal abnormality. Adrenals/Urinary Tract: Adrenal glands are within normal limits. Kidneys are well visualized bilaterally. No renal calculi or obstructive changes are noted. Bladder is well distended. Stomach/Bowel: Scattered diverticular change of the colon is noted without evidence of diverticulitis. No obstructive or inflammatory changes are seen. The appendix is within normal limits. Small bowel is within normal limits. Stomach  demonstrates some mild hyperemia and fold thickness near the fundus which may represent mild gastritis. Some hyperdense material is noted which may be related to ingested material although the possibility of acute hemorrhage could not be totally excluded on this exam. Vascular/Lymphatic: Aortic atherosclerosis. No enlarged abdominal or pelvic lymph nodes. Reproductive: Prostate is unremarkable. Other: No abdominal wall hernia or abnormality. No abdominopelvic ascites. Musculoskeletal: No compression deformity is noted. Bilateral facet hypertrophic changes are noted at L5 with minimal anterolisthesis of L5 on S1. IMPRESSION: Changes suggestive of mild gastritis. Additionally there is some hyperdense material in the dependent portion of the stomach which may be related to recent medication ingestion. Acute gastric hemorrhage cannot be totally excluded. Correlate clinical history. Diverticulosis without diverticulitis. Electronically Signed   By: Inez Catalina M.D.   On: 12/19/2022 19:57   DG Chest Port 1 View  Result Date: 12/19/2022 CLINICAL DATA:  Provided history: Chest pain. Shortness of breath. Nausea. EXAM: PORTABLE CHEST 1 VIEW COMPARISON:  Radiographs 07/19/2022 and earlier. FINDINGS: Heart size within normal limits. No appreciable airspace consolidation or pulmonary edema. No evidence of pleural effusion or pneumothorax. No acute osseous abnormality identified. IMPRESSION: No evidence of acute cardiopulmonary abnormality. Electronically Signed   By: Kellie Simmering D.O.   On: 12/19/2022 18:18    Procedures Procedures    Medications Ordered in ED Medications  alum & mag hydroxide-simeth (MAALOX/MYLANTA) 200-200-20 MG/5ML suspension 30 mL (30 mLs Oral Given 12/19/22 1752)    And  lidocaine (XYLOCAINE) 2 % viscous mouth solution 15 mL (15 mLs Oral Given 12/19/22 1752)  ondansetron (ZOFRAN) injection 4 mg (4 mg Intravenous Given 12/19/22 1743)  iohexol (OMNIPAQUE) 300 MG/ML solution 100 mL (100 mLs  Intravenous Contrast Given 12/19/22 1934)    ED Course/ Medical Decision Making/ A&P                            Medical Decision Making Amount and/or Complexity of Data Reviewed Given the large differential diagnosis for BENARD DESIMONE, the decision making in this case is of high complexity.  After evaluating all of the data points in this case, the presentation of DEE STEENO is NOT consistent with Acute Coronary Syndrome (ACS) and/or myocardial ischemia, pulmonary embolism, aortic dissection; Borhaave's, significant arrythmia, pneumothorax, cardiac tamponade, or other emergent cardiopulmonary condition.  Further, the presentation of ITZEL SCHILLING is NOT consistent with pericarditis, myocarditis, cholecystitis, pancreatitis, mediastinitis, endocarditis, new valvular disease.  Additionally, the presentation of Dashiell Chevere Stithis NOT consistent with flail chest, cardiac contusion, ARDS, or significant  intra-thoracic or intra-abdominal bleeding.  Moreover, this presentation is NOT consistent with pneumonia, sepsis, or pyelonephritis.   After review of all data points patient appears to have acute gastritis.  I ordered labs including CBC, CMP, troponin x 2, lipase mildly elevated but does not represent any significant abnormality.  I ordered and independently interpreted CT abdomen and pelvis.  Patient has what appears to be acute gastritis.  Radiologist cannot rule out hemorrhage however I believe this is the remnants of GI cocktail and Maalox I gave the patient.  He has had no hematemesis.  He does have a history of reflux and has not made dietary modifications recently.  These findings I will discharge the patient with Carafate, Zofran he should continue taking his Dexilant as well as dietary modifications and follow closely with gastroenterology.  Ordered and independently interpreted EKG which shows EKG with sinus rhythm at a rate of 74  Strict return and follow-up precautions have  been given by me personally or by detailed written instruction given verbally by nursing staff using the teach back method to the patient/family/caregiver(s).  Data Reviewed/Counseling: I have reviewed the patient's vital signs, nursing notes, and other relevant tests/information. I had a detailed discussion regarding the historical points, exam findings, and any diagnostic results supporting the discharge diagnosis. I also discussed the need for outpatient follow-up and the need to return to the ED if symptoms worsen or if there are any questions or concerns that arise at home.           Final Clinical Impression(s) / ED Diagnoses Final diagnoses:  Acute gastritis without hemorrhage, unspecified gastritis type    Rx / DC Orders ED Discharge Orders          Ordered    sucralfate (CARAFATE) 1 g tablet  3 times daily with meals & bedtime        12/19/22 2016    ondansetron (ZOFRAN) 4 MG tablet  Every 8 hours PRN        12/19/22 2016              Margarita Mail, PA-C 12/19/22 2058    Milton Ferguson, MD 12/21/22 1229

## 2022-12-19 NOTE — Discharge Instructions (Addendum)
Contact a health care provider if: Your symptoms get worse. Your abdominal pain gets worse. Your symptoms return after treatment. You have a fever. Get help right away if: You vomit blood or a substance that looks like coffee grounds. You have black or dark red stools. You are unable to keep fluids down. These symptoms may represent a serious problem that is an emergency. Do not wait to see if the symptoms will go away. Get medical help right away. Call your local emergency services (911 in the [place] Do not drive yourself to the hospital.

## 2022-12-19 NOTE — ED Triage Notes (Addendum)
Pt presents with L sided CP that radiates to his shoulder blade and started while sitting on the couch. Pt reports associated ShOB, nausea, and more frequent burping and flatulence. Pt describes pain as a constant ache. Pt has taken APAP and Tums without relief.

## 2022-12-23 ENCOUNTER — Telehealth: Payer: Self-pay

## 2022-12-23 NOTE — Progress Notes (Signed)
[facility] Quality Team Note  Name: Roy [name] Date of Birth: 03/29/1971 MRN: [id number] Date: 12/23/2022  [facility] Quality Team has reviewed this patient's chart, please see recommendations below:  [facility] Quality Other; (Called patient to offer [place] event 12/26/2022, left voicemail.)

## 2022-12-23 NOTE — Telephone Encounter (Signed)
        Patient  visited [facility] on 3/28    Telephone encounter attempt :   1st A HIPAA compliant voice message was left requesting a return call.  Instructed patient to call back.    [facility] [phone] [address], [place], [place] [address] Phone: [phone] Email: [email or web address]

## 2022-12-24 ENCOUNTER — Telehealth: Payer: Self-pay

## 2022-12-24 NOTE — Telephone Encounter (Signed)
     Patient  visit on 3/28  at [facility] you been able to follow up with your primary care physician? No     The patient was or was not able to obtain any needed medicine or equipment. Yes   Are there diet recommendations that you are having difficulty following? Na   Patient expresses understanding of discharge instructions and education provided has no other needs at this time.  Yes      [facility] [phone] [address], [place], [place] [address] Phone: [phone] Email: [email or web address]

## 2022-12-25 ENCOUNTER — Ambulatory Visit (INDEPENDENT_AMBULATORY_CARE_PROVIDER_SITE_OTHER): Payer: Medicare HMO | Admitting: Family Medicine

## 2022-12-25 ENCOUNTER — Encounter: Payer: Self-pay | Admitting: Family Medicine

## 2022-12-25 VITALS — BP 136/80 | HR 78 | Temp 97.9°F | Ht 68.0 in | Wt 333.6 lb

## 2022-12-25 DIAGNOSIS — J4521 Mild intermittent asthma with (acute) exacerbation: Secondary | ICD-10-CM

## 2022-12-25 DIAGNOSIS — K2100 Gastro-esophageal reflux disease with esophagitis, without bleeding: Secondary | ICD-10-CM | POA: Diagnosis not present

## 2022-12-25 DIAGNOSIS — R0602 Shortness of breath: Secondary | ICD-10-CM

## 2022-12-25 MED ORDER — LANSOPRAZOLE 30 MG PO CPDR: 30.0000 mg | DELAYED_RELEASE_CAPSULE | Freq: Every day | ORAL | 0 refills | Status: DC

## 2022-12-25 MED ORDER — METOPROLOL SUCCINATE ER 50 MG PO TB24: 50.0000 mg | ORAL_TABLET | Freq: Every day | ORAL | 2 refills | Status: DC

## 2022-12-25 MED ORDER — FEXOFENADINE-PSEUDOEPHED ER 180-240 MG PO TB24: 1.0000 | ORAL_TABLET | Freq: Every evening | ORAL | 11 refills | Status: AC

## 2022-12-25 NOTE — Progress Notes (Signed)
Subjective:  Patient ID: Roy [name], male    DOB: [birth year]  Age: 52 y.o. MRN: [id number]  CC: No chief complaint on file.   HPI Roy [name] presents for still belching fluid. Went to [facility] for excessive gas and nausea with vomiting X 4-5 over about 8 hours. That occurred 6 days ago. Wants to know when the belching will stop. Taking the sucralfate as directed. His CT showed no sign of significant abdominal disease including gall bladder. Concerned for wheezing from Asthma. Using inhaler as directed.   Info rom pharmacy suggests that the carvedilol may cause dyspnea due to his asthma, whereas metoprolol may not.     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   He has a past surgical history that includes Rotator cuff repair (Right); Biceps tendon repair (Right); Neck surgery; and Knee arthroscopy with lateral menisectomy (Left, 10/09/2021).   His family history includes Arrhythmia in his father; Asthma in his brother and daughter; Bipolar disorder in his brother; COPD in his father; Fibromyalgia in his sister; Hypertension in his mother; Stroke (age of onset: 56) in his mother.He reports that he has been smoking cigarettes. He has a 16.50 pack-year smoking  history. He quit smokeless tobacco use about 3 years ago.  His smokeless tobacco use included snuff and chew. He reports that he does not drink alcohol and does not use drugs.    ROS Review of Systems  Constitutional:  Negative for activity change, appetite change, chills and fever.  HENT:  Positive for congestion, postnasal drip, rhinorrhea and sinus pressure. Negative for ear discharge, ear pain, hearing loss, nosebleeds, sneezing and trouble swallowing.   Respiratory:  Positive for shortness of breath and wheezing. Negative for chest tightness.   Cardiovascular:  Negative for chest pain and palpitations.  Musculoskeletal:  Negative for arthralgias.  Skin:  Negative for rash.    Objective:  BP 136/80   Pulse 78   Temp 97.9 F (36.6 C)   Ht 5\' 8"  (1.727 m)   Wt (!) 333 lb 9.6 oz (151.3 kg)   SpO2 94%   BMI 50.72 kg/m   BP Readings from Last 3 Encounters:  12/25/22 136/80  12/19/22 121/68  12/16/22 126/74    Wt Readings from Last 3 Encounters:  12/25/22 (!) 333 lb 9.6 oz (151.3 kg)  12/19/22 (!) 320 lb (145.2 kg)  12/16/22 (!) 335 lb 3.2 oz (152 kg)     Physical Exam Vitals reviewed.  Constitutional:      Appearance: He is well-developed.  HENT:     Head: Normocephalic and atraumatic.     Right Ear: External ear  normal.     Left Ear: External ear normal.     Mouth/Throat:     Pharynx: No oropharyngeal exudate or posterior oropharyngeal erythema.  Eyes:     Pupils: Pupils are equal, round, and reactive to light.  Cardiovascular:     Rate and Rhythm: Normal rate and regular rhythm.     Heart sounds: No murmur heard. Pulmonary:     Effort: No respiratory distress.     Breath sounds: Normal breath sounds.  Musculoskeletal:     Cervical back: Normal range of motion and neck supple.  Neurological:     Mental Status: He is alert and oriented to person, place, and time.       Assessment & Plan:   Diagnoses and all orders for this visit:  Gastroesophageal  reflux disease with esophagitis without hemorrhage  Shortness of breath -     D-dimer, quantitative  Mild intermittent asthma with acute exacerbation  Other orders -     lansoprazole (PREVACID) 30 MG capsule; Take 1 capsule (30 mg total) by mouth daily at 12 noon. -     fexofenadine-pseudoephedrine (ALLEGRA-D 24) 180-240 MG 24 hr tablet; Take 1 tablet by mouth every evening. For allergy and congestion -     metoprolol succinate (TOPROL-XL) 50 MG 24 hr tablet; Take 1 tablet (50 mg total) by mouth daily. For blood pressure control       I have discontinued Romarion Galan. Misko's carvedilol. I am also having him start on lansoprazole, fexofenadine-pseudoephedrine, and metoprolol succinate. Additionally, I am having him maintain his acetaminophen, sildenafil, vitamin C, zinc gluconate, DULoxetine, Dexilant, gemfibrozil, levothyroxine, linaclotide, QUEtiapine, docusate sodium, albuterol, ALPRAZolam, rosuvastatin, traZODone, Vitamin D (Ergocalciferol), Blood Glucose Monitoring Suppl, Symbicort, Trulicity, fluticasone, meloxicam, sucralfate, and ondansetron.  Allergies as of 12/25/2022       Reactions   Penicillins Rash, Other (See Comments)   Has patient had a PCN reaction causing immediate rash, facial/tongue/throat swelling, SOB or lightheadedness with hypotension: Yes Has patient had a PCN reaction causing severe rash involving mucus membranes or skin necrosis: No Has patient had a PCN reaction that required hospitalization: No Has patient had a PCN reaction occurring within the last 10 years: No If all of the above answers are "NO", then may proceed with Cephalosporin use. Has patient had a PCN reaction causing immediate rash, facial/tongue/throat swelling, SOB or lightheadedness with hypotension: Yes Has patient had a PCN reaction causing severe rash involving mucus membranes or skin necrosis: No Has patient had a PCN reaction that required hospitalization: No Has patient had a PCN reaction  occurring within the last 10 years: No If all of the above answers are "NO", then may proceed with Cephalosporin use. unknown   Chantix [varenicline] Other (See Comments)   Severe headache        Medication List        Accurate as of December 25, 2022  6:03 PM. If you have any questions, ask your nurse or doctor.          STOP taking these medications    carvedilol 6.25 MG tablet Commonly known as: COREG Stopped by: Claretta Fraise, MD       TAKE these medications    acetaminophen 500 MG tablet Commonly known as: TYLENOL Take 1,000 mg by mouth every 6 (six) hours as needed for headache (pain).   albuterol 108 (90 Base) MCG/ACT inhaler Commonly known as: VENTOLIN HFA Inhale 1-2 puffs into the lungs every 6 (six) hours as needed for wheezing or  shortness of breath.   ALPRAZolam 0.5 MG tablet Commonly known as: XANAX Take 1 tablet (0.5 mg total) by mouth 3 (three) times daily as needed. for anxiety   Blood Glucose Monitoring Suppl Devi 1 each by Does not apply route in the morning, at noon, and at bedtime. May substitute to any manufacturer covered by patient's insurance.   Dexilant 60 MG capsule Generic drug: dexlansoprazole Take 1 capsule (60 mg total) by mouth daily.   docusate sodium 100 MG capsule Commonly known as: COLACE Take 100 mg by mouth daily.   DULoxetine 60 MG capsule Commonly known as: CYMBALTA Take 2 capsules (120 mg total) by mouth daily.   fexofenadine-pseudoephedrine 180-240 MG 24 hr tablet Commonly known as: ALLEGRA-D 24 Take 1 tablet by mouth every evening. For allergy and congestion Started by: Claretta Fraise, MD   fluticasone 50 MCG/ACT nasal spray Commonly known as: FLONASE SPRAY 2 SPRAYS INTO EACH NOSTRIL EVERY DAY What changed: See the new instructions.   gemfibrozil 600 MG tablet Commonly known as: LOPID Take 1 tablet (600 mg total) by mouth 2 (two) times daily.   lansoprazole 30 MG capsule Commonly known as: Prevacid Take 1  capsule (30 mg total) by mouth daily at 12 noon. Started by: Claretta Fraise, MD   levothyroxine 50 MCG tablet Commonly known as: SYNTHROID Take 1 tablet (50 mcg total) by mouth daily.   linaclotide 290 MCG Caps capsule Commonly known as: LINZESS Take 1 capsule (290 mcg total) by mouth daily as needed (constipation).   meloxicam 7.5 MG tablet Commonly known as: MOBIC TAKE 1 TABLET BY MOUTH EVERY DAY   metoprolol succinate 50 MG 24 hr tablet Commonly known as: TOPROL-XL Take 1 tablet (50 mg total) by mouth daily. For blood pressure control Started by: Claretta Fraise, MD   ondansetron 4 MG tablet Commonly known as: Zofran Take 1 tablet (4 mg total) by mouth every 8 (eight) hours as needed for nausea or vomiting.   QUEtiapine 200 MG tablet Commonly known as: SEROQUEL TAKE 1 TABLET BY MOUTH AT BEDTIME FOR  NERVES   rosuvastatin 5 MG tablet Commonly known as: CRESTOR Take 1 tablet (5 mg total) by mouth daily. For cholesterol   sildenafil 20 MG tablet Commonly known as: REVATIO Take 2-5 tablets daily as needed for sex   sucralfate 1 g tablet Commonly known as: Carafate Take 1 tablet (1 g total) by mouth 4 (four) times daily -  with meals and at bedtime.   Symbicort 160-4.5 MCG/ACT inhaler Generic drug: budesonide-formoterol INHALE 2 PUFFS BY MOUTH TWICE DAILY FOR ASTHMA What changed: See the new instructions.   traZODone 100 MG tablet Commonly known as: DESYREL Take 1 tablet (100 mg total) by mouth at bedtime.   Trulicity 3 0000000 Sopn Generic drug: Dulaglutide INJECT 3 MG AS DIRECTED ONCE A WEEK.   vitamin C 1000 MG tablet Take 1,000 mg by mouth daily.   Vitamin D (Ergocalciferol) 1.25 MG (50000 UNIT) Caps capsule Commonly known as: DRISDOL Take 1 capsule (50,000 Units total) by mouth every 7 (seven) days.   zinc gluconate 50 MG tablet Take 50 mg by mouth daily.         Follow-up: Return in about 1 month (around 01/24/2023).  Claretta Fraise, M.D.

## 2022-12-27 LAB — D-DIMER, QUANTITATIVE: D-DIMER: 0.38 mg/L FEU (ref 0.00–0.49)

## 2023-01-24 NOTE — Addendum Note (Signed)
Addended by: Quay Burow on: 01/24/2023 01:45 PM   Modules accepted: Level of Service

## 2023-02-04 DIAGNOSIS — R059 Cough, unspecified: Secondary | ICD-10-CM | POA: Diagnosis not present

## 2023-02-04 DIAGNOSIS — J189 Pneumonia, unspecified organism: Secondary | ICD-10-CM | POA: Diagnosis not present

## 2023-02-04 DIAGNOSIS — R0602 Shortness of breath: Secondary | ICD-10-CM | POA: Diagnosis not present

## 2023-02-10 ENCOUNTER — Ambulatory Visit (INDEPENDENT_AMBULATORY_CARE_PROVIDER_SITE_OTHER): Payer: Medicare HMO | Admitting: Family Medicine

## 2023-02-10 ENCOUNTER — Encounter: Payer: Self-pay | Admitting: Family Medicine

## 2023-02-10 VITALS — BP 118/70 | HR 66 | Temp 97.6°F | Ht 68.0 in | Wt 335.0 lb

## 2023-02-10 DIAGNOSIS — J189 Pneumonia, unspecified organism: Secondary | ICD-10-CM | POA: Diagnosis not present

## 2023-02-10 MED ORDER — GUAIFENESIN ER 600 MG PO TB12: 1200.0000 mg | ORAL_TABLET | Freq: Two times a day (BID) | ORAL | 0 refills | Status: AC

## 2023-02-10 MED ORDER — PREDNISONE 20 MG PO TABS: 40.0000 mg | ORAL_TABLET | Freq: Every day | ORAL | 0 refills | Status: AC

## 2023-02-10 NOTE — Progress Notes (Signed)
Subjective:  Patient ID: Roy [name], male    DOB: [birth year], 52 y.o.   MRN: [id number]  Patient Care Team: Mechele Claude, MD as PCP - General (Family Medicine)   Chief Complaint:  Pneumonia (Urgent care follow up- 02/04/2023/[facility]- pneumonia. States he is still having chest congestion and SOB that is a little better. )   HPI: Roy [name] is a 52 y.o. male presenting on 02/10/2023 for Pneumonia (Urgent care follow up- 02/04/2023/[facility]- pneumonia. States he is still having chest congestion and SOB that is a little better. )   Pt presents today for UC follow up. He was seen for cough, shortness of breath, and chest pain on 02/04/2023. EKG was unremarkable. CXR revealed RUL changes concerning for pneumonia. He was give IM depo-medrol and sent home with Levaquin and Albuterol nebulizer solution. He states he has improved but continues to have cough, congestion, and wheezing. States the [facility] is minimal.   Pneumonia He complains of chest tightness, cough, shortness of breath, sputum production and wheezing. There is no difficulty breathing, frequent throat clearing, hemoptysis or hoarse voice. The problem has been gradually improving. The cough is productive of sputum. Associated symptoms include dyspnea on exertion. Pertinent negatives include no appetite change, chest pain, ear congestion, ear pain, fever, headaches, heartburn, malaise/fatigue, myalgias, nasal congestion, orthopnea, PND, postnasal drip, rhinorrhea, sneezing, sore throat, sweats, trouble swallowing or weight loss. His symptoms are alleviated by beta-agonist (Levaquin). He reports moderate improvement on treatment.     Relevant past medical, surgical, family, and social history reviewed and updated as indicated.  Allergies and medications reviewed and updated. Data reviewed: Chart in Epic.   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   Depression    Diabetes mellitus without complication (HCC)    High cholesterol    Hypertension    Hypothyroidism    Low testosterone    Median nerve dysfunction    Radiculopathy of cervical region    Thyroid disease    Ulnar neuropathy at elbow    Weakness     Past Surgical History:  Procedure Laterality Date   BICEPS TENDON REPAIR Right    KNEE ARTHROSCOPY WITH LATERAL MENISECTOMY Left 10/09/2021   Procedure: KNEE ARTHROSCOPY WITH LATERAL AND MEDIAL MENISCECTOMY;  Surgeon: Vickki Hearing, MD;  Location: [facility];  Service: Orthopedics;  Laterality: Left;   NECK SURGERY     Fusion done two seperate times   ROTATOR CUFF REPAIR Right     Social History   Socioeconomic History   Marital status: Divorced    Spouse name: Not on file   Number of children: 1   Years of education: GED   Highest education level: GED or equivalent  Occupational History   Occupation: Disability    Comment: Worked as Advice worker  Tobacco Use   Smoking status: Every Day    Packs/day: 0.50    Years: 33.00    Additional pack years: 0.00    Total pack years: 16.50    Types: Cigarettes   Smokeless tobacco: Former    Types: Snuff, Chew    Quit date: 10/05/2019  Vaping Use   Vaping Use: Never used  Substance and Sexual Activity   Alcohol use: No  Drug use: No   Sexual activity: Yes  Other Topics Concern   Not on file  Social History Narrative   Lives with daughter   Social Determinants of Health   Financial Resource Strain: Low Risk  (12/17/2022)   Overall Financial Resource Strain (CARDIA)    Difficulty of Paying Living Expenses: Not hard at all  Food Insecurity: No Food Insecurity (12/17/2022)   Hunger Vital Sign    Worried About Running Out of Food in the Last Year: Never true    Ran Out of Food in the Last Year: Never true  Transportation Needs: No Transportation Needs  (12/17/2022)   PRAPARE - Administrator, Civil Service (Medical): No    Lack of Transportation (Non-Medical): No  Physical Activity: Inactive (12/17/2022)   Exercise Vital Sign    Days of Exercise per Week: 0 days    Minutes of Exercise per Session: 0 min  Stress: No Stress Concern Present (12/17/2022)   [employer] of Occupational Health - Occupational Stress Questionnaire    Feeling of Stress : Not at all  Social Connections: Moderately Integrated (12/17/2022)   Social Connection and Isolation Panel [NHANES]    Frequency of Communication with Friends and Family: More than three times a week    Frequency of Social Gatherings with Friends and Family: More than three times a week    Attends Religious Services: More than 4 times per year    Active Member of [employer] or Organizations: Yes    Attends Engineer, structural: More than 4 times per year    Marital Status: Divorced  Intimate Partner Violence: Not At Risk (12/17/2022)   Humiliation, Afraid, Rape, and Kick questionnaire    Fear of Current or Ex-Partner: No    Emotionally Abused: No    Physically Abused: No    Sexually Abused: No    Outpatient Encounter Medications as of 02/10/2023  Medication Sig   acetaminophen (TYLENOL) 500 MG tablet Take 1,000 mg by mouth every 6 (six) hours as needed for headache (pain).    albuterol (VENTOLIN HFA) 108 (90 Base) MCG/ACT inhaler Inhale 1-2 puffs into the lungs every 6 (six) hours as needed for wheezing or shortness of breath.   ALPRAZolam (XANAX) 0.5 MG tablet Take 1 tablet (0.5 mg total) by mouth 3 (three) times daily as needed. for anxiety   Ascorbic Acid (VITAMIN C) 1000 MG tablet Take 1,000 mg by mouth daily.   Blood Glucose Monitoring Suppl DEVI 1 each by Does not apply route in the morning, at noon, and at bedtime. May substitute to any manufacturer covered by patient's insurance.   DEXILANT 60 MG capsule Take 1 capsule (60 mg total) by mouth daily.   docusate sodium  (COLACE) 100 MG capsule Take 100 mg by mouth daily.   Dulaglutide (TRULICITY) 3 MG/0.5ML SOPN INJECT 3 MG AS DIRECTED ONCE A WEEK.   DULoxetine (CYMBALTA) 60 MG capsule Take 2 capsules (120 mg total) by mouth daily.   fexofenadine-pseudoephedrine (ALLEGRA-D 24) 180-240 MG 24 hr tablet Take 1 tablet by mouth every evening. For allergy and congestion   fluticasone (FLONASE) 50 MCG/ACT nasal spray SPRAY 2 SPRAYS INTO EACH NOSTRIL EVERY DAY (Patient taking differently: Place 2 sprays into both nostrils daily.)   gemfibrozil (LOPID) 600 MG tablet Take 1 tablet (600 mg total) by mouth 2 (two) times daily.   guaiFENesin (MUCINEX) 600 MG 12 hr tablet Take 2 tablets (1,200 mg total) by mouth 2 (two) times daily for 10  days.   lansoprazole (PREVACID) 30 MG capsule Take 1 capsule (30 mg total) by mouth daily at 12 noon.   levothyroxine (SYNTHROID) 50 MCG tablet Take 1 tablet (50 mcg total) by mouth daily.   linaclotide (LINZESS) 290 MCG CAPS capsule Take 1 capsule (290 mcg total) by mouth daily as needed (constipation).   meloxicam (MOBIC) 7.5 MG tablet TAKE 1 TABLET BY MOUTH EVERY DAY   metoprolol succinate (TOPROL-XL) 50 MG 24 hr tablet Take 1 tablet (50 mg total) by mouth daily. For blood pressure control   ondansetron (ZOFRAN) 4 MG tablet Take 1 tablet (4 mg total) by mouth every 8 (eight) hours as needed for nausea or vomiting.   predniSONE (DELTASONE) 20 MG tablet Take 2 tablets (40 mg total) by mouth daily with breakfast for 5 days.   QUEtiapine (SEROQUEL) 200 MG tablet TAKE 1 TABLET BY MOUTH AT BEDTIME FOR  NERVES   rosuvastatin (CRESTOR) 5 MG tablet Take 1 tablet (5 mg total) by mouth daily. For cholesterol   sildenafil (REVATIO) 20 MG tablet Take 2-5 tablets daily as needed for sex   sucralfate (CARAFATE) 1 g tablet Take 1 tablet (1 g total) by mouth 4 (four) times daily -  with meals and at bedtime.   SYMBICORT 160-4.5 MCG/ACT inhaler INHALE 2 PUFFS BY MOUTH TWICE DAILY FOR ASTHMA (Patient taking  differently: Inhale 2 puffs into the lungs in the morning and at bedtime.  FOR ASTHMA)   traZODone (DESYREL) 100 MG tablet Take 1 tablet (100 mg total) by mouth at bedtime.   Vitamin D, Ergocalciferol, (DRISDOL) 1.25 MG (50000 UNIT) CAPS capsule Take 1 capsule (50,000 Units total) by mouth every 7 (seven) days.   zinc gluconate 50 MG tablet Take 50 mg by mouth daily.   promethazine-dextromethorphan (PROMETHAZINE-DM) 6.25-15 MG/5ML syrup Take by mouth. (Patient not taking: Reported on 02/10/2023)   [DISCONTINUED] famotidine (PEPCID) 20 MG tablet Take 1 tablet (20 mg total) by mouth 2 (two) times daily for 14 days.   No facility-administered encounter medications on file as of 02/10/2023.    Allergies  Allergen Reactions   Penicillins Rash and Other (See Comments)    Has patient had a PCN reaction causing immediate rash, facial/tongue/throat swelling, SOB or lightheadedness with hypotension: Yes Has patient had a PCN reaction causing severe rash involving mucus membranes or skin necrosis: No Has patient had a PCN reaction that required hospitalization: No Has patient had a PCN reaction occurring within the last 10 years: No If all of the above answers are "NO", then may proceed with Cephalosporin use. Has patient had a PCN reaction causing immediate rash, facial/tongue/throat swelling, SOB or lightheadedness with hypotension: Yes Has patient had a PCN reaction causing severe rash involving mucus membranes or skin necrosis: No Has patient had a PCN reaction that required hospitalization: No Has patient had a PCN reaction occurring within the last 10 years: No If all of the above answers are "NO", then may proceed with Cephalosporin use. unknown    Chantix [Varenicline] Other (See Comments)    Severe headache    Review of Systems  Constitutional:  Negative for activity change, appetite change, chills, diaphoresis, fatigue, fever, malaise/fatigue, unexpected weight change and weight loss.   HENT:  Positive for congestion. Negative for ear pain, hoarse voice, postnasal drip, rhinorrhea, sneezing, sore throat and trouble swallowing.   Eyes: Negative.  Negative for photophobia and visual disturbance.  Respiratory:  Positive for cough, sputum production, shortness of breath and wheezing. Negative for apnea,  hemoptysis, choking, chest tightness and stridor.   Cardiovascular:  Positive for dyspnea on exertion. Negative for chest pain, palpitations, leg swelling and PND.  Gastrointestinal:  Negative for abdominal pain, blood in stool, constipation, diarrhea, heartburn, nausea and vomiting.  Endocrine: Negative.  Negative for polydipsia, polyphagia and polyuria.  Genitourinary:  Negative for decreased urine volume, difficulty urinating, dysuria, frequency and urgency.  Musculoskeletal:  Negative for arthralgias and myalgias.  Skin: Negative.   Allergic/Immunologic: Negative.   Neurological:  Negative for dizziness, tremors, seizures, syncope, facial asymmetry, speech difficulty, weakness, light-headedness, numbness and headaches.  Hematological: Negative.   Psychiatric/Behavioral:  Negative for confusion, hallucinations, sleep disturbance and suicidal ideas.   All other systems reviewed and are negative.       Objective:  BP 118/70   Pulse 66   Temp 97.6 F (36.4 C) (Temporal)   Ht 5\' 8"  (1.727 m)   Wt (!) 335 lb (152 kg)   SpO2 95%   BMI 50.94 kg/m    Wt Readings from Last 3 Encounters:  02/10/23 (!) 335 lb (152 kg)  12/25/22 (!) 333 lb 9.6 oz (151.3 kg)  12/19/22 (!) 320 lb (145.2 kg)    Physical Exam Vitals and nursing note reviewed.  Constitutional:      General: He is not in acute distress.    Appearance: Normal appearance. He is well-developed and well-groomed. He is morbidly obese. He is not ill-appearing, toxic-appearing or diaphoretic.  HENT:     Head: Normocephalic and atraumatic.     Jaw: There is normal jaw occlusion.     Right Ear: Hearing, tympanic  membrane, ear canal and external ear normal.     Left Ear: Hearing, tympanic membrane, ear canal and external ear normal.     Nose: Nose normal.     Mouth/Throat:     Lips: Pink.     Mouth: Mucous membranes are moist.     Pharynx: Oropharynx is clear. Uvula midline.  Eyes:     General: Lids are normal.     Extraocular Movements: Extraocular movements intact.     Conjunctiva/sclera: Conjunctivae normal.     Pupils: Pupils are equal, round, and reactive to light.  Neck:     Thyroid: No thyroid mass, thyromegaly or thyroid tenderness.     Vascular: No carotid bruit or JVD.     Trachea: Trachea and phonation normal.  Cardiovascular:     Rate and Rhythm: Normal rate and regular rhythm.     Chest Wall: PMI is not displaced.     Pulses: Normal pulses.     Heart sounds: Normal heart sounds. No murmur heard.    No friction rub. No gallop.  Pulmonary:     Effort: Pulmonary effort is normal. No respiratory distress.     Breath sounds: Examination of the right-upper field reveals wheezing and rhonchi. Examination of the left-upper field reveals wheezing. Wheezing and rhonchi present. No rales.  Abdominal:     General: Bowel sounds are normal. There is no distension or abdominal bruit.     Palpations: Abdomen is soft. There is no hepatomegaly or splenomegaly.     Tenderness: There is no abdominal tenderness. There is no right CVA tenderness or left CVA tenderness.     Hernia: No hernia is present.  Musculoskeletal:        General: Normal range of motion.     Cervical back: Normal range of motion and neck supple.     Right lower leg: No edema.  Left lower leg: No edema.  Lymphadenopathy:     Cervical: No cervical adenopathy.  Skin:    General: Skin is warm and dry.     Capillary Refill: Capillary refill takes less than 2 seconds.     Coloration: Skin is not cyanotic, jaundiced or pale.     Findings: No rash.  Neurological:     General: No focal deficit present.     Mental Status: He  is alert and oriented to person, place, and time.     Sensory: Sensation is intact.     Motor: Motor function is intact.     Coordination: Coordination is intact.     Gait: Gait is intact.     Deep Tendon Reflexes: Reflexes are normal and symmetric.  Psychiatric:        Attention and Perception: Attention and perception normal.        Mood and Affect: Mood and affect normal.        Speech: Speech normal.        Behavior: Behavior normal. Behavior is cooperative.        Thought Content: Thought content normal.        Cognition and Memory: Cognition and memory normal.        Judgment: Judgment normal.     Results for orders placed or performed in visit on 12/25/22  D-dimer, quantitative  Result Value Ref Range   D-DIMER 0.38 0.00 - 0.49 mg/L FEU       Pertinent labs & imaging results that were available during my care of the patient were reviewed by me and considered in my medical decision making.  Assessment & Plan:  Kavyn was seen today for pneumonia.  Diagnoses and all orders for this visit:  Community acquired pneumonia of right upper lobe of lung Finish Levaquin therapy as prescribed. Will add Mucinex and prednisone. Report new, worsening, or persistent symptoms. Follow up in 4 weeks for repeat imaging.  -     guaiFENesin (MUCINEX) 600 MG 12 hr tablet; Take 2 tablets (1,200 mg total) by mouth 2 (two) times daily for 10 days. -     predniSONE (DELTASONE) 20 MG tablet; Take 2 tablets (40 mg total) by mouth daily with breakfast for 5 days.     Continue all other maintenance medications.  Follow up plan: Return in about 4 weeks (around 03/10/2023), or if symptoms worsen or fail to improve, for CAP.   Continue healthy lifestyle choices, including diet (rich in fruits, vegetables, and lean proteins, and low in salt and simple carbohydrates) and exercise (at least 30 minutes of moderate physical activity daily).  Educational handout given for CAP  The above assessment and  management plan was discussed with the patient. The patient verbalized understanding of and has agreed to the management plan. Patient is aware to call the clinic if they develop any new symptoms or if symptoms persist or worsen. Patient is aware when to return to the clinic for a follow-up visit. Patient educated on when it is appropriate to go to the emergency department.   Kari Baars, FNP-C [facility] [phone]

## 2023-02-16 ENCOUNTER — Other Ambulatory Visit: Payer: Self-pay | Admitting: Family Medicine

## 2023-02-16 DIAGNOSIS — J418 Mixed simple and mucopurulent chronic bronchitis: Secondary | ICD-10-CM

## 2023-02-26 ENCOUNTER — Other Ambulatory Visit: Payer: Self-pay | Admitting: Family Medicine

## 2023-02-26 DIAGNOSIS — F411 Generalized anxiety disorder: Secondary | ICD-10-CM

## 2023-02-26 DIAGNOSIS — M5416 Radiculopathy, lumbar region: Secondary | ICD-10-CM

## 2023-03-01 ENCOUNTER — Other Ambulatory Visit: Payer: Self-pay | Admitting: Family Medicine

## 2023-03-01 DIAGNOSIS — F411 Generalized anxiety disorder: Secondary | ICD-10-CM

## 2023-03-03 ENCOUNTER — Other Ambulatory Visit: Payer: Self-pay | Admitting: Family Medicine

## 2023-03-03 ENCOUNTER — Encounter: Payer: Self-pay | Admitting: Family Medicine

## 2023-03-03 DIAGNOSIS — F411 Generalized anxiety disorder: Secondary | ICD-10-CM

## 2023-03-03 MED ORDER — ALPRAZOLAM 0.5 MG PO TABS: 0.5000 mg | ORAL_TABLET | Freq: Three times a day (TID) | ORAL | 5 refills | Status: DC | PRN

## 2023-03-19 ENCOUNTER — Encounter: Payer: Self-pay | Admitting: Family Medicine

## 2023-03-19 ENCOUNTER — Other Ambulatory Visit: Payer: Self-pay | Admitting: Family Medicine

## 2023-03-19 ENCOUNTER — Ambulatory Visit (INDEPENDENT_AMBULATORY_CARE_PROVIDER_SITE_OTHER): Payer: Medicare HMO | Admitting: Family Medicine

## 2023-03-19 VITALS — BP 125/77 | HR 68 | Temp 97.6°F | Ht 68.0 in | Wt 339.0 lb

## 2023-03-19 DIAGNOSIS — Z7985 Long-term (current) use of injectable non-insulin antidiabetic drugs: Secondary | ICD-10-CM

## 2023-03-19 DIAGNOSIS — F411 Generalized anxiety disorder: Secondary | ICD-10-CM | POA: Diagnosis not present

## 2023-03-19 DIAGNOSIS — E038 Other specified hypothyroidism: Secondary | ICD-10-CM

## 2023-03-19 DIAGNOSIS — M5416 Radiculopathy, lumbar region: Secondary | ICD-10-CM

## 2023-03-19 DIAGNOSIS — E782 Mixed hyperlipidemia: Secondary | ICD-10-CM

## 2023-03-19 DIAGNOSIS — E119 Type 2 diabetes mellitus without complications: Secondary | ICD-10-CM | POA: Diagnosis not present

## 2023-03-19 DIAGNOSIS — I10 Essential (primary) hypertension: Secondary | ICD-10-CM | POA: Diagnosis not present

## 2023-03-19 DIAGNOSIS — E349 Endocrine disorder, unspecified: Secondary | ICD-10-CM

## 2023-03-19 LAB — BAYER DCA HB A1C WAIVED: HB A1C (BAYER DCA - WAIVED): 5.8 % — ABNORMAL HIGH (ref 4.8–5.6)

## 2023-03-19 MED ORDER — QUETIAPINE FUMARATE 200 MG PO TABS: ORAL_TABLET | ORAL | 2 refills | Status: DC

## 2023-03-19 MED ORDER — DULOXETINE HCL 60 MG PO CPEP: 120.0000 mg | ORAL_CAPSULE | Freq: Every day | ORAL | 2 refills | Status: DC

## 2023-03-19 NOTE — Progress Notes (Signed)
Subjective:  Patient ID: Roy [name],  male    DOB: [birth year]  Age: 52 y.o.    CC: Medical Management of Chronic Issues   HPI Roy [name] presents for  follow-up of hypertension. Patient has no history of headache chest pain or shortness of breath or recent cough. Patient also denies symptoms of TIA such as numbness weakness lateralizing. Patient denies side effects from medication. States taking it regularly.  Patient also  in for follow-up of elevated cholesterol. Doing well without complaints on current medication. Denies side effects  including myalgia and arthralgia and nausea. Also in today for liver function testing. Currently no chest pain, shortness of breath or other cardiovascular related symptoms noted.  Follow-up of diabetes. Patient does check blood sugar at home. Readings run between 90-115 fasting and the same post prandial.Log reviewed. Patient denies symptoms such as excessive hunger or urinary frequency, excessive hunger, nausea No significant hypoglycemic spells noted. Medications reviewed. Pt reports taking them regularly. Pt. denies complication/adverse reaction today. He took his last Tulicity yesterday. Was told it is backordered.   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   He has a past surgical history that includes Rotator cuff repair (Right); Biceps tendon repair (Right); Neck surgery; and Knee arthroscopy with lateral menisectomy (Left, 10/09/2021).   His family history includes Arrhythmia in his father; Asthma in his brother and daughter; Bipolar disorder in his brother; COPD in his father; Fibromyalgia in his sister; Hypertension in his mother; Stroke (age of onset:  58) in his mother.He reports that he has been smoking cigarettes. He has a 16.50 pack-year smoking history. He quit smokeless tobacco use about 3 years ago.  His smokeless tobacco use included snuff and chew. He reports that he does not drink alcohol and does not use drugs.  Current Outpatient Medications on File Prior to Visit  Medication Sig Dispense Refill   acetaminophen (TYLENOL) 500 MG tablet Take 1,000 mg by mouth every 6 (six) hours as needed for headache (pain).      albuterol (VENTOLIN HFA) 108 (90 Base) MCG/ACT inhaler Inhale 1-2 puffs into the lungs every 6 (six) hours as needed for wheezing or shortness of breath. 18 g 5   ALPRAZolam (XANAX) 0.5 MG tablet Take 1 tablet (0.5 mg total) by mouth 3 (three) times daily as needed. for anxiety 90 tablet 5   Ascorbic Acid (VITAMIN C) 1000 MG tablet Take 1,000 mg by mouth daily.     Blood Glucose Monitoring Suppl DEVI 1 each by Does not apply route in the morning, at noon, and at bedtime. May substitute to any manufacturer covered by patient's insurance. 1 each 0   docusate sodium (COLACE) 100 MG capsule Take 100 mg by mouth daily.     Dulaglutide (TRULICITY) 3 MG/0.5ML SOPN INJECT 3 MG AS DIRECTED ONCE A WEEK. 6 mL 0   fexofenadine-pseudoephedrine (ALLEGRA-D 24) 180-240 MG 24 hr tablet Take 1 tablet by mouth every evening. For allergy and congestion 30 tablet 11   fluticasone (FLONASE) 50 MCG/ACT nasal spray SPRAY 2 SPRAYS INTO EACH NOSTRIL EVERY DAY (Patient taking differently: Place 2 sprays into both nostrils daily.) 48 mL 1   gemfibrozil (LOPID) 600 MG tablet Take 1 tablet (600 mg total) by mouth 2 (two) times daily. 180 tablet 3  levothyroxine (SYNTHROID) 50 MCG tablet Take 1 tablet (50 mcg total) by mouth daily. 90 tablet 3   linaclotide (LINZESS) 290 MCG CAPS capsule Take 1 capsule (290 mcg total) by mouth daily as needed (constipation). 90 capsule 3   ondansetron (ZOFRAN) 4 MG tablet Take 1 tablet (4 mg total) by mouth every 8 (eight)  hours as needed for nausea or vomiting. 10 tablet 0   rosuvastatin (CRESTOR) 5 MG tablet Take 1 tablet (5 mg total) by mouth daily. For cholesterol 90 tablet 3   sildenafil (REVATIO) 20 MG tablet Take 2-5 tablets daily as needed for sex 180 tablet 1   SYMBICORT 160-4.5 MCG/ACT inhaler INHALE 2 PUFFS BY MOUTH TWICE DAILY FOR ASTHMA 10.2 each 5   traZODone (DESYREL) 100 MG tablet Take 1 tablet (100 mg total) by mouth at bedtime. 90 tablet 3   Vitamin D, Ergocalciferol, (DRISDOL) 1.25 MG (50000 UNIT) CAPS capsule Take 1 capsule (50,000 Units total) by mouth every 7 (seven) days. 13 capsule 3   zinc gluconate 50 MG tablet Take 50 mg by mouth daily.     [DISCONTINUED] famotidine (PEPCID) 20 MG tablet Take 1 tablet (20 mg total) by mouth 2 (two) times daily for 14 days. 28 tablet 0   No current facility-administered medications on file prior to visit.    ROS Review of Systems  Constitutional:  Negative for fever.  Respiratory:  Negative for shortness of breath.   Cardiovascular:  Negative for chest pain.  Musculoskeletal:  Negative for arthralgias.  Skin:  Negative for rash.    Objective:  BP 125/77   Pulse 68   Temp 97.6 F (36.4 C)   Ht 5\' 8"  (1.727 m)   Wt (!) 339 lb (153.8 kg)   SpO2 97%   BMI 51.54 kg/m   BP Readings from Last 3 Encounters:  03/19/23 125/77  02/10/23 118/70  12/25/22 136/80    Wt Readings from Last 3 Encounters:  03/19/23 (!) 339 lb (153.8 kg)  02/10/23 (!) 335 lb (152 kg)  12/25/22 (!) 333 lb 9.6 oz (151.3 kg)     Physical Exam Vitals reviewed.  Constitutional:      Appearance: He is well-developed.  HENT:     Head: Normocephalic and atraumatic.     Right Ear: External ear normal.     Left Ear: External ear normal.     Mouth/Throat:     Pharynx: No oropharyngeal exudate or posterior oropharyngeal erythema.  Eyes:     Pupils: Pupils are equal, round, and reactive to light.  Cardiovascular:     Rate and Rhythm: Normal rate and regular rhythm.      Heart sounds: No murmur heard. Pulmonary:     Effort: No respiratory distress.     Breath sounds: Normal breath sounds.  Musculoskeletal:     Cervical back: Normal range of motion and neck supple.  Neurological:     Mental Status: He is alert and oriented to person, place, and time.     Diabetic Foot Exam - Simple   No data filed     Lab Results  Component Value Date   HGBA1C 5.7 (H) 12/16/2022   HGBA1C 7.0 (H) 09/05/2022   HGBA1C 6.1 (H) 06/05/2022    Assessment & Plan:   Silas was seen today for medical management of chronic issues.  Diagnoses and all orders for this visit:  Benign essential HTN -     CBC with Differential/Platelet -     CMP14+EGFR  Other specified  hypothyroidism -     TSH + free T4  New onset type 2 diabetes mellitus (HCC) -     Bayer DCA Hb A1c Waived  Mixed hyperlipidemia -     Lipid panel  Lumbar radiculopathy -     DULoxetine (CYMBALTA) 60 MG capsule; Take 2 capsules (120 mg total) by mouth daily.  GAD (generalized anxiety disorder) -     DULoxetine (CYMBALTA) 60 MG capsule; Take 2 capsules (120 mg total) by mouth daily. -     QUEtiapine (SEROQUEL) 200 MG tablet; TAKE 1 TABLET BY MOUTH AT BEDTIME FOR  NERVES  Hypotestosteronism   I have discontinued Bertram Millard. Homeyer's Dexilant, meloxicam, sucralfate, and lansoprazole. I have also changed his DULoxetine. Additionally, I am having him maintain his acetaminophen, sildenafil, vitamin C, zinc gluconate, gemfibrozil, levothyroxine, linaclotide, docusate sodium, albuterol, rosuvastatin, traZODone, Vitamin D (Ergocalciferol), Blood Glucose Monitoring Suppl, fluticasone, ondansetron, fexofenadine-pseudoephedrine, Symbicort, Trulicity, ALPRAZolam, and QUEtiapine.  Meds ordered this encounter  Medications   DULoxetine (CYMBALTA) 60 MG capsule    Sig: Take 2 capsules (120 mg total) by mouth daily.    Dispense:  180 capsule    Refill:  2   QUEtiapine (SEROQUEL) 200 MG tablet    Sig: TAKE  1 TABLET BY MOUTH AT BEDTIME FOR  NERVES    Dispense:  90 tablet    Refill:  2     Follow-up: Return in about 3 months (around 06/19/2023) for diabetes.  Mechele Claude, M.D.

## 2023-03-19 NOTE — Patient Instructions (Signed)
Hypoglycemia Hypoglycemia occurs when the level of sugar (glucose) in the blood is too low. Hypoglycemia can happen in people who have or do not have diabetes. It can develop quickly, and it can be a medical emergency. For most people, a blood glucose level below 70 mg/dL (3.9 mmol/L) is considered hypoglycemia. Glucose is a type of sugar that provides the body's main source of energy. Certain hormones (insulin and glucagon) control the level of glucose in the blood. Insulin lowers blood glucose, and glucagon raises blood glucose. Hypoglycemia can result from having too much insulin in the bloodstream, or from not eating enough food that contains glucose. You may also have reactive hypoglycemia, which happens within 4 hours after eating a meal. What are the causes? Hypoglycemia occurs most often in people who have diabetes and may be caused by: Diabetes medicine. Not eating enough, or not eating often enough. Increased physical activity. Drinking alcohol on an empty stomach. If you do not have diabetes, hypoglycemia may be caused by: A tumor in the pancreas. Not eating enough, or not eating for long periods at a time (fasting). A severe infection or illness. Problems after having bariatric surgery. Organ failure, such as kidney or liver failure. Certain medicines. What increases the risk? Hypoglycemia is more likely to develop in people who: Have diabetes and take medicines to lower blood glucose. Abuse alcohol. Have a severe illness. What are the signs or symptoms? Symptoms vary depending on whether the condition is mild, moderate, or severe. Mild hypoglycemia Hunger. Sweating and feeling clammy. Dizziness or feeling light-headed. Sleepiness or restless sleep. Nausea. Increased heart rate. Headache. Blurry vision. Mood changes, such as irritability or anxiety. Tingling or numbness around the mouth, lips, or tongue. Moderate hypoglycemia Confusion and poor judgment. Behavior  changes. Weakness. Irregular heartbeat. A change in coordination. Severe hypoglycemia Severe hypoglycemia is a medical emergency. It can cause: Fainting. Seizures. Loss of consciousness (coma). Death. How is this diagnosed? Hypoglycemia is diagnosed with a blood test to measure your blood glucose level. This blood test is done while you are having symptoms. Your health care provider may also do a physical exam and review your medical history. How is this treated? This condition can be treated by immediately eating or drinking something that contains sugar with 15 grams of fast-acting carbohydrate, such as: 4 oz (120 mL) of fruit juice. 4 oz (120 mL) of regular soda (not diet soda). Several pieces of hard candy. Check food labels to find out how many pieces to eat for 15 grams. 1 Tbsp (15 mL) of sugar or honey. 4 glucose tablets. 1 tube of glucose gel. Treating hypoglycemia if you have diabetes If you are alert and able to swallow safely, follow the 15:15 rule: Take 15 grams of a fast-acting carbohydrate. Talk with your health care provider about how much you should take. Options for getting 15 grams of fast-acting carbohydrate include: Glucose tablets (take 4 tablets). Several pieces of hard candy. Check food labels to find out how many pieces to eat for 15 grams. 4 oz (120 mL) of fruit juice. 4 oz (120 mL) of regular soda (not diet soda). 1 Tbsp (15 mL) of sugar or honey. 1 tube of glucose gel. Check your blood glucose 15 minutes after you take the carbohydrate. If the repeat blood glucose level is still at or below 70 mg/dL (3.9 mmol/L), take 15 grams of a carbohydrate again. If your blood glucose level does not increase above 70 mg/dL (3.9 mmol/L) after 3 tries, seek emergency   medical care. After your blood glucose level returns to normal, eat a meal or a snack within 1 hour.  Treating severe hypoglycemia Severe hypoglycemia is when your blood glucose level is below 54 mg/dL (3  mmol/L). Severe hypoglycemia is a medical emergency. Get medical help right away. If you have severe hypoglycemia and you cannot eat or drink, you will need to be given glucagon. A family member or close friend should learn how to check your blood glucose and how to give you glucagon. Ask your health care provider if you need to have an emergency glucagon kit available. Severe hypoglycemia may need to be treated in a hospital. The treatment may include getting glucose through an IV. You may also need treatment for the cause of your hypoglycemia. Follow these instructions at home:  General instructions Take over-the-counter and prescription medicines only as told by your health care provider. Monitor your blood glucose as told by your health care provider. If you drink alcohol: Limit how much you have to: 0-1 drink a day for women who are not pregnant. 0-2 drinks a day for men. Know how much alcohol is in your drink. In the [place] one drink equals one 12 oz bottle of beer (355 mL), one 5 oz glass of wine (148 mL), or one 1 oz glass of hard liquor (44 mL). Be sure to eat food along with drinking alcohol. Be aware that alcohol is absorbed quickly and may have lingering effects that may result in hypoglycemia later. Be sure to do ongoing glucose monitoring. Keep all follow-up visits. This is important. If you have diabetes: Always have a fast-acting carbohydrate (15 grams) option with you to treat low blood glucose. Follow your diabetes management plan as directed by your health care provider. Make sure you: Know the symptoms of hypoglycemia. It is important to treat it right away to prevent it from becoming severe. Check your blood glucose as often as told. Always check before and after exercise. Always check your blood glucose before you drive a motorized vehicle. Take your medicines as told. Follow your meal plan. Eat on time, and do not skip meals. Share your diabetes management plan with  people in your workplace, school, and household. Carry a medical alert card or wear medical alert jewelry. Where to find more information American Diabetes Association: www.diabetes.org Contact a health care provider if: You have problems keeping your blood glucose in your target range. You have frequent episodes of hypoglycemia. Get help right away if: You continue to have hypoglycemia symptoms after eating or drinking something that contains 15 grams of fast-acting carbohydrate, and you cannot get your blood glucose above 70 mg/dL (3.9 mmol/L) while following the 15:15 rule. Your blood glucose is below 54 mg/dL (3 mmol/L). You have a seizure. You faint. These symptoms may represent a serious problem that is an emergency. Do not wait to see if the symptoms will go away. Get medical help right away. Call your local emergency services (911 in the [place] Do not drive yourself to the hospital. Summary Hypoglycemia occurs when the level of sugar (glucose) in the blood is too low. Hypoglycemia can happen in people who have or do not have diabetes. It can develop quickly, and it can be a medical emergency. Make sure you know the symptoms of hypoglycemia and how to treat it. Always have a fast-acting carbohydrate option with you to treat low blood sugar. This information is not intended to replace advice given to you by your health care provider.   Make sure you discuss any questions you have with your health care provider. Document Revised: 08/10/2020 Document Reviewed: 08/10/2020 Elsevier Patient Education  2024 [employer]  

## 2023-03-20 LAB — CBC WITH DIFFERENTIAL/PLATELET
Basophils Absolute: 0 10*3/uL (ref 0.0–0.2)
Basos: 0 %
EOS (ABSOLUTE): 0.1 10*3/uL (ref 0.0–0.4)
Eos: 2 %
Hematocrit: 39.6 % (ref 37.5–51.0)
Hemoglobin: 13.4 g/dL (ref 13.0–17.7)
Immature Grans (Abs): 0 10*3/uL (ref 0.0–0.1)
Immature Granulocytes: 0 %
Lymphocytes Absolute: 1.8 10*3/uL (ref 0.7–3.1)
Lymphs: 35 %
MCH: 30.2 pg (ref 26.6–33.0)
MCHC: 33.8 g/dL (ref 31.5–35.7)
MCV: 89 fL (ref 79–97)
Monocytes Absolute: 0.3 10*3/uL (ref 0.1–0.9)
Monocytes: 6 %
Neutrophils Absolute: 3 10*3/uL (ref 1.4–7.0)
Neutrophils: 57 %
Platelets: 235 10*3/uL (ref 150–450)
RBC: 4.43 x10E6/uL (ref 4.14–5.80)
RDW: 13.9 % (ref 11.6–15.4)
WBC: 5.3 10*3/uL (ref 3.4–10.8)

## 2023-03-20 LAB — TSH+FREE T4
Free T4: 0.98 ng/dL (ref 0.82–1.77)
TSH: 2.07 u[IU]/mL (ref 0.450–4.500)

## 2023-03-20 LAB — CMP14+EGFR
ALT: 10 IU/L (ref 0–44)
AST: 14 IU/L (ref 0–40)
Albumin: 4.4 g/dL (ref 4.1–5.1)
Alkaline Phosphatase: 74 IU/L (ref 44–121)
BUN/Creatinine Ratio: 10 (ref 9–20)
BUN: 8 mg/dL (ref 6–24)
Bilirubin Total: 0.2 mg/dL (ref 0.0–1.2)
CO2: 21 mmol/L (ref 20–29)
Calcium: 9.2 mg/dL (ref 8.7–10.2)
Chloride: 103 mmol/L (ref 96–106)
Creatinine, Ser: 0.79 mg/dL (ref 0.76–1.27)
Globulin, Total: 2.1 g/dL (ref 1.5–4.5)
Glucose: 97 mg/dL (ref 70–99)
Potassium: 4.2 mmol/L (ref 3.5–5.2)
Sodium: 139 mmol/L (ref 134–144)
Total Protein: 6.5 g/dL (ref 6.0–8.5)
eGFR: 109 mL/min/{1.73_m2} (ref >59)

## 2023-03-20 LAB — LIPID PANEL
Chol/HDL Ratio: 2.9 ratio (ref 0.0–5.0)
Cholesterol, Total: 121 mg/dL (ref 100–199)
HDL: 42 mg/dL (ref >39)
LDL Chol Calc (NIH): 53 mg/dL (ref 0–99)
Triglycerides: 152 mg/dL — ABNORMAL HIGH (ref 0–149)
VLDL Cholesterol Cal: 26 mg/dL (ref 5–40)

## 2023-03-20 NOTE — Progress Notes (Signed)
Hello Gorman,  Your lab result is normal and/or stable.Some minor variations that are not significant are commonly marked abnormal, but do not represent any medical problem for you.  Best regards, Gurdeep Keesey, M.D.

## 2023-03-21 ENCOUNTER — Ambulatory Visit (INDEPENDENT_AMBULATORY_CARE_PROVIDER_SITE_OTHER): Payer: Medicare HMO

## 2023-03-21 ENCOUNTER — Ambulatory Visit (INDEPENDENT_AMBULATORY_CARE_PROVIDER_SITE_OTHER): Payer: Medicare HMO | Admitting: Family Medicine

## 2023-03-21 ENCOUNTER — Encounter: Payer: Self-pay | Admitting: Family Medicine

## 2023-03-21 VITALS — BP 137/86 | HR 61 | Temp 98.3°F | Ht 68.0 in | Wt 339.0 lb

## 2023-03-21 DIAGNOSIS — R0789 Other chest pain: Secondary | ICD-10-CM

## 2023-03-21 DIAGNOSIS — R0989 Other specified symptoms and signs involving the circulatory and respiratory systems: Secondary | ICD-10-CM

## 2023-03-21 DIAGNOSIS — I10 Essential (primary) hypertension: Secondary | ICD-10-CM

## 2023-03-21 MED ORDER — PREDNISONE 20 MG PO TABS: 40.0000 mg | ORAL_TABLET | Freq: Every day | ORAL | 0 refills | Status: AC

## 2023-03-21 NOTE — Progress Notes (Addendum)
Acute Office Visit  Subjective:  Patient ID: Roy [name], male    DOB: 05-22-1971, 52 y.o.   MRN: [id number]  Chief Complaint  Patient presents with   congestion/shortness of breath    Chest pain  headache   HPI Patient is in today for nasal congestion, chest congestion and tightness. States that yesterday it was mainly nasal. Today it is chest. Productive cough with clear phelgm. Has headaches and shortness of breath. Endorses chest tightness in the middle of his chest. Started Wednesday and saw [facility] for regular checkup. Worsened yesterday. Is taking Mucinex DM and Theraflu Severe Cold and Cough. Denies runny nose, fever. Endorses sinus pressure. States that he had similar symptoms one month ago and was treated with prednisone and decongestants. States that he took albuterol before coming in. Patient has not used nebulizer this week, but states that he has one at home from previous Rx.   ROS As per HPI   Objective:  BP 137/86   Pulse 61   Temp 98.3 F (36.8 C)   Ht 5\' 8"  (1.727 m)   Wt (!) 339 lb (153.8 kg)   SpO2 95%   BMI 51.54 kg/m   Physical Exam Constitutional:      General: He is awake. He is not in acute distress.    Appearance: Normal appearance. He is well-developed and well-groomed. He is morbidly obese. He is not ill-appearing, toxic-appearing or diaphoretic.  HENT:     Right Ear: There is impacted cerumen.     Left Ear: There is impacted cerumen.     Nose: Congestion and rhinorrhea present. Rhinorrhea is clear.     Right Nostril: No foreign body, epistaxis, septal hematoma or occlusion.     Left Nostril: No foreign body, epistaxis, septal hematoma or occlusion.     Right Turbinates: Not enlarged, swollen or pale.     Left Turbinates: Not enlarged, swollen or pale.     Right Sinus: Maxillary sinus tenderness present.     Left Sinus: Maxillary sinus tenderness present.     Mouth/Throat:     Lips: Pink.     Mouth: Mucous membranes are moist. No injury.      Dentition: Abnormal dentition.     Tongue: No lesions.     Palate: No mass.     Pharynx: Oropharynx is clear. Posterior oropharyngeal erythema present. No pharyngeal swelling or oropharyngeal exudate.     Tonsils: No tonsillar exudate or tonsillar abscesses.  Neck:     Thyroid: No thyroid mass or thyromegaly.     Trachea: Trachea and phonation normal.  Cardiovascular:     Rate and Rhythm: Normal rate and regular rhythm.     Pulses: Normal pulses.          Radial pulses are 2+ on the right side and 2+ on the left side.       Posterior tibial pulses are 2+ on the right side and 2+ on the left side.     Heart sounds: Normal heart sounds. No murmur heard.    No gallop.  Pulmonary:     Effort: Pulmonary effort is normal. No respiratory distress.     Breath sounds: Normal breath sounds. No stridor. No wheezing, rhonchi or rales.  Musculoskeletal:     Cervical back: Full passive range of motion without pain and neck supple. No edema or erythema.     Right lower leg: No edema.     Left lower leg: No edema.  Lymphadenopathy:  Cervical: No cervical adenopathy.     Right cervical: No superficial, deep or posterior cervical adenopathy.    Left cervical: No superficial, deep or posterior cervical adenopathy.  Skin:    General: Skin is warm.     Capillary Refill: Capillary refill takes less than 2 seconds.  Neurological:     General: No focal deficit present.     Mental Status: He is alert, oriented to person, place, and time and easily aroused. Mental status is at baseline.     GCS: GCS eye subscore is 4. GCS verbal subscore is 5. GCS motor subscore is 6.     Motor: No weakness.  Psychiatric:        Attention and Perception: Attention and perception normal.        Mood and Affect: Mood and affect normal.        Speech: Speech normal.        Behavior: Behavior normal. Behavior is cooperative.        Thought Content: Thought content normal. Thought content does not include homicidal or  suicidal ideation. Thought content does not include homicidal or suicidal plan.        Cognition and Memory: Cognition and memory normal.        Judgment: Judgment normal.       03/21/2023   10:05 AM 03/19/2023    8:54 AM 03/19/2023    8:22 AM  Depression screen PHQ 2/9  Decreased Interest 1 1 0  Down, Depressed, Hopeless 0 1 0  PHQ - 2 Score 1 2 0  Altered sleeping 0 0   Tired, decreased energy 1 1   Change in appetite 0 0   Feeling bad or failure about yourself  0 0   Trouble concentrating 0 0   Moving slowly or fidgety/restless 0 0   Suicidal thoughts 0 0   PHQ-9 Score 2 3   Difficult doing work/chores Not difficult at all Not difficult at all       03/21/2023   10:15 AM 03/19/2023    8:54 AM 02/10/2023   11:23 AM 12/25/2022    1:28 PM  GAD 7 : Generalized Anxiety Score  Nervous, Anxious, on Edge 0 0 1 1  Control/stop worrying 0 1 1 1   Worry too much - different things 1 1 1 1   Trouble relaxing 0 0 0 0  Restless 0 0 0 0  Easily annoyed or irritable 1 1 1 1   Afraid - awful might happen 1 1 1 1   Total GAD 7 Score 3 4 5 5   Anxiety Difficulty Not difficult at all Not difficult at all Not difficult at all Not difficult at all    Assessment & Plan:  1. Chest tightness Imaging as below. Steroid burst to cover for potential asthma/COPD exacerbation. Replaced referral for pulmonology for patient as he missed his previous appt. Will wait for results of xray to start antibiotics given length of symptoms. Labs as below. Will communicate results to patient once available. Discussed with patient that given length of symptoms and timing of test, may not be able to treat for Covid/Flu.  - Ambulatory referral to Pulmonology - DG Chest 2 View; Future - predniSONE (DELTASONE) 20 MG tablet; Take 2 tablets (40 mg total) by mouth daily with breakfast for 5 days.  Dispense: 10 tablet; Refill: 0 - COVID-19, Flu A+B and RSV  2. Chest congestion See above.  - predniSONE (DELTASONE) 20 MG tablet;  Take 2 tablets (40 mg total)  by mouth daily with breakfast for 5 days.  Dispense: 10 tablet; Refill: 0  3. Benign essential HTN Elevated BP today in office. Instructed patient to take OTC cold medicines safe for HTN. Discussed with patient to follow up with PCP and monitor BP at home.   The above assessment and management plan was discussed with the patient. The patient verbalized understanding of and has agreed to the management plan using shared-decision making. Patient is aware to call the clinic if they develop any new symptoms or if symptoms fail to improve or worsen. Patient is aware when to return to the clinic for a follow-up visit. Patient educated on when it is appropriate to go to the emergency department.   Return if symptoms worsen or fail to improve.  Neale Burly, DNP-FNP [facility] [address] [place], [place] [address] [phone]

## 2023-03-21 NOTE — Patient Instructions (Signed)

## 2023-03-22 LAB — COVID-19, FLU A+B AND RSV
Influenza A, NAA: NOT DETECTED
Influenza B, NAA: NOT DETECTED
RSV, NAA: NOT DETECTED
SARS-CoV-2, NAA: NOT DETECTED

## 2023-03-24 ENCOUNTER — Other Ambulatory Visit: Payer: Self-pay | Admitting: Family Medicine

## 2023-03-24 NOTE — Progress Notes (Signed)
Negative viral panel, continue current plan. Return if symptoms continue or do not improve.

## 2023-03-26 NOTE — Progress Notes (Signed)
Normal chest xray. Continue current plan. If symptoms continue, return to clinic

## 2023-04-17 ENCOUNTER — Ambulatory Visit (INDEPENDENT_AMBULATORY_CARE_PROVIDER_SITE_OTHER): Payer: Medicare HMO | Admitting: Family Medicine

## 2023-04-17 ENCOUNTER — Encounter: Payer: Self-pay | Admitting: Family Medicine

## 2023-04-17 DIAGNOSIS — R399 Unspecified symptoms and signs involving the genitourinary system: Secondary | ICD-10-CM | POA: Diagnosis not present

## 2023-04-17 DIAGNOSIS — Z125 Encounter for screening for malignant neoplasm of prostate: Secondary | ICD-10-CM | POA: Diagnosis not present

## 2023-04-17 DIAGNOSIS — R300 Dysuria: Secondary | ICD-10-CM | POA: Diagnosis not present

## 2023-04-17 LAB — URINALYSIS, ROUTINE W REFLEX MICROSCOPIC
Bilirubin, UA: NEGATIVE
Glucose, UA: NEGATIVE
Ketones, UA: NEGATIVE
Leukocytes,UA: NEGATIVE
Nitrite, UA: NEGATIVE
Protein,UA: NEGATIVE
RBC, UA: NEGATIVE
Specific Gravity, UA: 1.01 (ref 1.005–1.030)
Urobilinogen, Ur: 0.2 mg/dL (ref 0.2–1.0)
pH, UA: 6 (ref 5.0–7.5)

## 2023-04-17 MED ORDER — TAMSULOSIN HCL 0.4 MG PO CAPS: 0.4000 mg | ORAL_CAPSULE | Freq: Every day | ORAL | 3 refills | Status: DC

## 2023-04-17 NOTE — Progress Notes (Signed)
   Acute Office Visit  Subjective:     Patient ID: Roy [name], male    DOB: 05/17/1971, 52 y.o.   MRN: [id number]  Chief Complaint  Patient presents with   Dysuria    Back pain left groin dull ache Sensation of needing to urinate with little to no output     Urinary Frequency  This is a new problem. Episode onset: 3 days. The problem occurs intermittently. The problem has been unchanged. The patient is experiencing no pain. There has been no fever. Associated symptoms include frequency, hesitancy and urgency. Pertinent negatives include no chills, discharge, flank pain, hematuria, nausea, possible pregnancy, sweats or vomiting. He has tried nothing for the symptoms. The treatment provided no relief. There is no history of kidney stones, recurrent UTIs, urinary stasis or a urological procedure.     Review of Systems  Constitutional:  Negative for chills.  Gastrointestinal:  Negative for nausea and vomiting.  Genitourinary:  Positive for frequency, hesitancy and urgency. Negative for flank pain and hematuria.        Objective:    There were no vitals taken for this visit.   Physical Exam Vitals and nursing note reviewed.  Constitutional:      General: He is not in acute distress.    Appearance: He is not ill-appearing, toxic-appearing or diaphoretic.  Cardiovascular:     Rate and Rhythm: Regular rhythm.     Heart sounds: Normal heart sounds. No murmur heard. Pulmonary:     Effort: Pulmonary effort is normal. No respiratory distress.     Breath sounds: Normal breath sounds.  Abdominal:     General: Bowel sounds are normal. There is no distension.     Palpations: Abdomen is soft.     Tenderness: There is no abdominal tenderness. There is no right CVA tenderness, left CVA tenderness, guarding or rebound.  Musculoskeletal:     Right lower leg: No edema.     Left lower leg: No edema.  Skin:    General: Skin is warm and dry.  Neurological:     General: No focal  deficit present.     Mental Status: He is alert and oriented to person, place, and time.  Psychiatric:        Mood and Affect: Mood normal.        Behavior: Behavior normal.     No results found for any visits on 04/17/23.      Assessment & Plan:   [name] was seen today for dysuria.  Diagnoses and all orders for this visit:  Lower urinary tract symptoms UA normal today. PSA pending. Try flomax. Discussed referral to urology if PSA is elevated or symtpoms persist. Follow up for new or worsening symptoms. -     Urinalysis, Routine w reflex microscopic -     Urine Culture -     tamsulosin (FLOMAX) 0.4 MG CAPS capsule; Take 1 capsule (0.4 mg total) by mouth daily. -     PSA, total and free  The patient indicates understanding of these issues and agrees with the plan.  Gabriel Earing, FNP

## 2023-04-22 NOTE — Progress Notes (Unsigned)
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  referred to pulmonary clinic in [place]  04/23/2023 by Dr Mechele Claude  for doe x 2022 with onset chest tightness started in 09/2022  that resolved by 02/2023    Baseline wt pre covid around 340 per office visits in [facility]    History of Present Illness  04/23/2023  Pulmonary/ 1st office eval/ Roy [name] / [name] on symbicort 160 Chief Complaint  Patient presents with   Establish Care  Dyspnea:  walking at [employer]/ uses [facility] parking due to pain L hip / weakness  Cough: none  Sleep: flat bed, 2 pillows  SABA use: rarely 02: none  Lung cancer screen: rec for age 52  Overt hb despite dexilant daily   No obvious day to day or daytime pattern/variability or assoc excess/ purulent sputum or mucus plugs or hemoptysis or cp or ongoing chest tightness, subjective wheeze or overt sinus or hb symptoms.   Sleeping as above without nocturnal  or early am exacerbation  of respiratory  c/o's or need for noct saba. Also denies any obvious fluctuation of symptoms with weather or environmental changes or other aggravating or alleviating factors except as outlined above   No unusual exposure hx or h/o childhood pna/ asthma or knowledge of premature birth.  Current Allergies, Complete Past Medical History, Past Surgical History, Family History, and Social History were reviewed in [employer] record.  ROS  The following are not active complaints unless bolded Hoarseness, sore throat, dysphagia, dental problems, itching, sneezing,  nasal congestion or discharge of excess mucus or purulent secretions, ear ache,   fever, chills, sweats, unintended wt loss or wt gain, classically pleuritic or exertional cp,  orthopnea pnd or arm/hand swelling  or leg swelling, presyncope, palpitations, abdominal pain, anorexia, nausea, vomiting, diarrhea  or change  in bowel habits or change in bladder habits, change in stools or change in urine, dysuria, hematuria,  rash, arthralgias, visual complaints, headache, numbness, weakness or ataxia or problems with walking or coordination,  change in mood or  memory.            Outpatient Medications Prior to Visit  Medication Sig Dispense Refill   ACCU-CHEK GUIDE test strip      acetaminophen (TYLENOL) 500 MG tablet Take 1,000 mg by mouth every 6 (six) hours as needed for headache (pain).      albuterol (VENTOLIN HFA) 108 (90 Base) MCG/ACT inhaler Inhale 1-2 puffs into the lungs every 6 (six) hours as needed for wheezing or shortness of breath. 18 g 5   ALPRAZolam (XANAX) 0.5 MG tablet Take 1 tablet (0.5 mg total) by mouth 3 (three) times daily as needed. for anxiety 90 tablet 5   Ascorbic Acid (VITAMIN C) 1000 MG tablet Take 1,000 mg by mouth daily.     Blood Glucose Monitoring Suppl DEVI 1 each by Does not apply route in the morning, at noon, and at bedtime. May substitute to any manufacturer covered by patient's insurance. 1 each 0   DEXILANT 60 MG capsule Take 1 capsule by mouth daily.     docusate sodium (COLACE) 100 MG capsule Take 100 mg by mouth daily.     Dulaglutide (TRULICITY) 3 MG/0.5ML SOPN INJECT 3 MG AS DIRECTED ONCE A WEEK. 6 mL 0   DULoxetine (CYMBALTA) 60 MG capsule Take 2 capsules (120 mg total)  by mouth daily. 180 capsule 2   fexofenadine-pseudoephedrine (ALLEGRA-D 24) 180-240 MG 24 hr tablet Take 1 tablet by mouth every evening. For allergy and congestion 30 tablet 11   fluticasone (FLONASE) 50 MCG/ACT nasal spray SPRAY 2 SPRAYS INTO EACH NOSTRIL EVERY DAY (Patient taking differently: Place 2 sprays into both nostrils daily.) 48 mL 1   gemfibrozil (LOPID) 600 MG tablet Take 1 tablet (600 mg total) by mouth 2 (two) times daily. 180 tablet 3   levothyroxine (SYNTHROID) 50 MCG tablet Take 1 tablet (50 mcg total) by mouth daily. 90 tablet 3   linaclotide (LINZESS) 290 MCG CAPS capsule Take 1  capsule (290 mcg total) by mouth daily as needed (constipation). 90 capsule 3   metoprolol succinate (TOPROL-XL) 50 MG 24 hr tablet TAKE 1 TABLET BY MOUTH DAILY. FOR BLOOD PRESSURE CONTROL 90 tablet 1   ondansetron (ZOFRAN) 4 MG tablet Take 1 tablet (4 mg total) by mouth every 8 (eight) hours as needed for nausea or vomiting. 10 tablet 0   QUEtiapine (SEROQUEL) 200 MG tablet TAKE 1 TABLET BY MOUTH AT BEDTIME FOR  NERVES 90 tablet 2   rosuvastatin (CRESTOR) 5 MG tablet Take 1 tablet (5 mg total) by mouth daily. For cholesterol 90 tablet 3   sildenafil (REVATIO) 20 MG tablet Take 2-5 tablets daily as needed for sex 180 tablet 1   SYMBICORT 160-4.5 MCG/ACT inhaler INHALE 2 PUFFS BY MOUTH TWICE DAILY FOR ASTHMA 10.2 each 5   tamsulosin (FLOMAX) 0.4 MG CAPS capsule Take 1 capsule (0.4 mg total) by mouth daily. 30 capsule 3   traZODone (DESYREL) 100 MG tablet Take 1 tablet (100 mg total) by mouth at bedtime. 90 tablet 3   Vitamin D, Ergocalciferol, (DRISDOL) 1.25 MG (50000 UNIT) CAPS capsule Take 1 capsule (50,000 Units total) by mouth every 7 (seven) days. 13 capsule 3   zinc gluconate 50 MG tablet Take 50 mg by mouth daily.     No facility-administered medications prior to visit.    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- no surgery   Depression    Diabetes mellitus without complication (HCC)    High cholesterol    Hypertension    Hypothyroidism    Low testosterone    Median nerve dysfunction    Radiculopathy of cervical region    Thyroid disease    Ulnar neuropathy at elbow    Weakness       Objective:     BP 122/85   Pulse 76   Ht 5\' 8"  (1.727 m)   Wt (!) 339 lb (153.8 kg)   SpO2 92%   BMI 51.54 kg/m   SpO2: 92 % RA  MO (by BMI ) amb somber wm nad    HEENT : Oropharynx  clear         NECK :  without  apparent JVD/ palpable Nodes/TM    LUNGS: no acc  muscle use,  Nl contour chest which is clear to A and P bilaterally without cough on insp or exp maneuvers   CV:  RRR  no s3 or murmur or increase in P2, and no edema   ABD:  soft and nontender with limited  inspiratory excursion in the supine position. No bruits or organomegaly appreciated   MS:  Nl gait/ ext warm without deformities Or  obvious joint restrictions  calf tenderness, cyanosis or clubbing    SKIN: warm and dry without lesions    NEURO:  alert, approp, nl sensorium with  no motor or cerebellar deficits apparent.       I personally reviewed images and agree with radiology impression as follows:  CXR:  PA and lateral 03/01/23 No active cardiopulmonary disease.   Assessment   DOE (dyspnea on exertion) 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  - baseline wt around 300 pre covid  - 04/23/2023   Walked on RA  x  3  lap(s) =  approx 450  ft  @ mod fast pace, stopped due to end of study with lowest 02 sats 94% @ wt 339  - 04/23/2023  After extensive coaching inhaler device,  effectiveness =    75% (delayed insp p trigger)   Does not appear to have much COPD but apparently prone to AB so rec continue symb 160 2 q 12 and prn saba  Work on insp technique with hfa   F/u with pfts in 3-4 m   Chronic GERD Assoc with intermittent chest tightness since early 2024 despite Dexilant - diet/ lifestyle reviewed 04/23/2023 >>>   Chest tightness is not pleuritic or exertional so likely is related to poorly controlled non acidic GERD which needs more aggressive diet/ lifestyle choices (see separate a/p)   F/u PCP/ GI prn   Morbid obesity (HCC) Body mass index is 51.54 kg/m.   Lab Results  Component Value Date   TSH 2.070 03/19/2023      Contributing to doe and  GERD plus risk of dvt/PE >>>   reviewed the need and the process to achieve and maintain neg calorie balance > defer f/u primary care including intermittently monitoring thyroid status     Each maintenance medication was reviewed in detail  including emphasizing most importantly the difference between maintenance and prns and under what circumstances the prns are to be triggered using an action plan format where appropriate.  Total time for H and P, chart review, counseling, reviewing hfa device(s) , directly observing portions of ambulatory 02 saturation study/ and generating customized AVS unique to this office visit / same day charting = 45 min new pt eval                   Sandrea Hughs, MD 04/23/2023

## 2023-04-23 ENCOUNTER — Encounter: Payer: Self-pay | Admitting: Internal Medicine

## 2023-04-23 ENCOUNTER — Ambulatory Visit: Payer: Medicare HMO | Admitting: Internal Medicine

## 2023-04-23 VITALS — BP 122/85 | HR 76 | Ht 68.0 in | Wt 339.0 lb

## 2023-04-23 DIAGNOSIS — R0609 Other forms of dyspnea: Secondary | ICD-10-CM

## 2023-04-23 DIAGNOSIS — K219 Gastro-esophageal reflux disease without esophagitis: Secondary | ICD-10-CM | POA: Diagnosis not present

## 2023-04-23 NOTE — Assessment & Plan Note (Addendum)
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  - baseline wt around 300 pre covid  - 04/23/2023   Walked on RA  x  3  lap(s) =  approx 450  ft  @ mod fast pace, stopped due to end of study with lowest 02 sats 94% @ wt 339  - 04/23/2023  After extensive coaching inhaler device,  effectiveness =    75% (delayed insp p trigger)   Does not appear to have much COPD but apparently prone to AB so rec continue symb 160 2 q 12 and prn saba  Work on insp technique with hfa   F/u with pfts in 3-4 m

## 2023-04-23 NOTE — Assessment & Plan Note (Signed)
Body mass index is 51.54 kg/m.   Lab Results  Component Value Date   TSH 2.070 03/19/2023      Contributing to doe and  GERD plus risk of dvt/PE >>>   reviewed the need and the process to achieve and maintain neg calorie balance > defer f/u primary care including intermittently monitoring thyroid status     Each maintenance medication was reviewed in detail including emphasizing most importantly the difference between maintenance and prns and under what circumstances the prns are to be triggered using an action plan format where appropriate.  Total time for H and P, chart review, counseling, reviewing hfa device(s) , directly observing portions of ambulatory 02 saturation study/ and generating customized AVS unique to this office visit / same day charting = 45 min new pt eval

## 2023-04-23 NOTE — Patient Instructions (Addendum)
The key is to stop smoking completely before smoking completely stops you!  Work on inhaler technique:  relax and gently blow all the way out then take a nice smooth full deep breath back in, triggering the inhaler at same time you start breathing in.  Hold breath in for at least  5 seconds if you can. Blow out symbicort  thru nose. Rinse and gargle with water when done.  If mouth or throat bother you at all,  try brushing teeth/gums/tongue with arm and hammer toothpaste/ make a slurry and gargle and spit out.   GERD (REFLUX)  is an extremely common cause of respiratory symptoms just like yours , many times with no obvious heartburn at all.    It can be treated with medication, but also with lifestyle changes including elevation of the head of your bed (ideally with 6-8inch blocks under the headboard of your bed),  Smoking cessation, avoidance of late meals, excessive alcohol, and avoid fatty foods, chocolate, peppermint, colas, red wine, and acidic juices such as orange juice.  NO MINT OR MENTHOL PRODUCTS SO NO COUGH DROPS  USE SUGARLESS CANDY INSTEAD (Jolley ranchers or Stover's or Life Savers) or even ice chips will also do - the key is to swallow to prevent all throat clearing. NO OIL BASED VITAMINS - use powdered substitutes.  Avoid fish oil when coughing.    I recommend you start lung cancer screening at age 62 and continue yearly x 15 years after you quit smoking or until the age of 59    Please schedule a follow up visit in 3-4 months  with PFTs same day

## 2023-04-23 NOTE — Assessment & Plan Note (Signed)
Assoc with intermittent chest tightness since early 2024 despite Dexilant - diet/ lifestyle reviewed 04/23/2023 >>>   Chest tightness is not pleuritic or exertional so likely is related to poorly controlled non acidic GERD which needs more aggressive diet/ lifestyle choices (see separate a/p)   F/u PCP/ GI prn

## 2023-04-27 ENCOUNTER — Other Ambulatory Visit: Payer: Self-pay | Admitting: Family Medicine

## 2023-04-30 ENCOUNTER — Other Ambulatory Visit: Payer: Self-pay | Admitting: Family Medicine

## 2023-05-06 ENCOUNTER — Encounter: Payer: Self-pay | Admitting: *Deleted

## 2023-05-23 ENCOUNTER — Other Ambulatory Visit: Payer: Self-pay | Admitting: Family Medicine

## 2023-05-23 DIAGNOSIS — F411 Generalized anxiety disorder: Secondary | ICD-10-CM

## 2023-05-23 DIAGNOSIS — M5416 Radiculopathy, lumbar region: Secondary | ICD-10-CM

## 2023-05-29 ENCOUNTER — Other Ambulatory Visit: Payer: Self-pay | Admitting: Family Medicine

## 2023-06-12 ENCOUNTER — Other Ambulatory Visit: Payer: Self-pay | Admitting: Family Medicine

## 2023-06-12 DIAGNOSIS — E039 Hypothyroidism, unspecified: Secondary | ICD-10-CM

## 2023-06-15 ENCOUNTER — Other Ambulatory Visit: Payer: Self-pay | Admitting: Family Medicine

## 2023-06-19 ENCOUNTER — Ambulatory Visit: Payer: Medicare HMO | Admitting: Family Medicine

## 2023-06-20 ENCOUNTER — Ambulatory Visit (HOSPITAL_COMMUNITY)
Admission: RE | Admit: 2023-06-20 | Discharge: 2023-06-20 | Disposition: A | Discharge status: Home / Self Care | Payer: Medicare HMO | Source: Ambulatory Visit | Attending: Internal Medicine | Admitting: Internal Medicine

## 2023-06-20 DIAGNOSIS — R0609 Other forms of dyspnea: Secondary | ICD-10-CM | POA: Diagnosis not present

## 2023-06-20 LAB — PULMONARY FUNCTION TEST
DL/VA % pred: 87 %
DL/VA: 3.93 ml/min/mmHg/L
DLCO unc % pred: 92 %
DLCO unc: 25.47 ml/min/mmHg
FEF 25-75 Post: 3.01 L/s
FEF 25-75 Pre: 2.14 L/s
FEF2575-%Change-Post: 40 %
FEF2575-%Pred-Post: 91 %
FEF2575-%Pred-Pre: 65 %
FEV1-%Change-Post: 9 %
FEV1-%Pred-Post: 87 %
FEV1-%Pred-Pre: 80 %
FEV1-Post: 3.23 L
FEV1-Pre: 2.96 L
FEV1FVC-%Change-Post: 2 %
FEV1FVC-%Pred-Pre: 95 %
FEV6-%Change-Post: 6 %
FEV6-%Pred-Post: 93 %
FEV6-%Pred-Pre: 87 %
FEV6-Post: 4.24 L
FEV6-Pre: 3.99 L
FEV6FVC-%Change-Post: 0 %
FEV6FVC-%Pred-Post: 103 %
FEV6FVC-%Pred-Pre: 103 %
FVC-%Change-Post: 6 %
FVC-%Pred-Post: 90 %
FVC-%Pred-Pre: 84 %
FVC-Post: 4.26 L
FVC-Pre: 4 L
Post FEV1/FVC ratio: 76 %
Post FEV6/FVC ratio: 100 %
Pre FEV1/FVC ratio: 74 %
Pre FEV6/FVC Ratio: 100 %

## 2023-06-20 MED ORDER — ALBUTEROL SULFATE (2.5 MG/3ML) 0.083% IN NEBU
2.5000 mg | INHALATION_SOLUTION | Freq: Once | RESPIRATORY_TRACT | Status: AC
  Administered 2023-06-20: 2.5 mg via RESPIRATORY_TRACT

## 2023-06-28 ENCOUNTER — Other Ambulatory Visit: Payer: Self-pay | Admitting: Family Medicine

## 2023-06-29 NOTE — Progress Notes (Unsigned)
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  referred to pulmonary clinic in [place]  04/23/2023 by Dr Mechele Claude  for doe x 2022 with onset chest tightness started in 09/2022  that resolved by 02/2023    Baseline wt pre covid around 340 per office visits in [facility]   PFTs 06/24/23 obesity only    History of Present Illness  04/23/2023  Pulmonary/ 1st office eval/ Daysi Boggan / [place] Office on symbicort 160 Chief Complaint  Patient presents with   Establish Care  Dyspnea:  walking at [employer]/ uses [facility] parking due to pain L hip and L  / weakness  Cough: none  Sleep: flat bed, 2 pillows  SABA use: rarely 02: none  Lung cancer screen: rec for age 21  Overt hb despite dexilant daily  Rec The key is to stop smoking completely before smoking completely stops you! Work on inhaler technique:  GERD diet reviewed, bed blocks rec   Please schedule a follow up visit in 3-4 months  with PFTs same day    06/30/2023  f/u ov/[place] office/Cheris Tweten re: MO/ ? Mild asthma  maint on symb 160 2bid   Chief Complaint  Patient presents with   Shortness of Breath  Dyspnea:  limited by L hip and knee > sob  Cough: none  Sleeping: flat bed/ 2 pillows s    resp cc  SABA use: rarely  02: none   Lung cancer screening: referred today    No obvious day to day or daytime variability or assoc excess/ purulent sputum or mucus plugs or hemoptysis or cp or chest tightness, subjective wheeze or overt sinus or hb symptoms.    Also denies any obvious fluctuation of symptoms with weather or environmental changes or other aggravating or alleviating factors except as outlined above   No unusual exposure hx or h/o childhood pna/ asthma or knowledge of premature birth.  Current Allergies, Complete Past Medical History, Past Surgical History, Family History, and Social History were reviewed in [employer] record.  ROS  The following are not active complaints unless bolded Hoarseness, sore throat, dysphagia, dental problems, itching, sneezing,  nasal congestion or discharge of excess mucus or purulent secretions, ear ache,   fever, chills, sweats, unintended wt loss or wt gain, classically pleuritic or exertional cp,  orthopnea pnd or arm/hand swelling  or leg swelling, presyncope, palpitations, abdominal pain, anorexia, nausea, vomiting, diarrhea  or change in bowel habits or change in bladder habits, change in stools or change in urine, dysuria, hematuria,  rash, arthralgias, visual complaints, headache, numbness, weakness or ataxia or problems with walking or coordination,  change in mood or  memory.        Current Meds  Medication Sig   ACCU-CHEK GUIDE test strip    acetaminophen (TYLENOL) 500 MG tablet Take 1,000 mg by mouth every 6 (six) hours as needed for headache (pain).    albuterol (PROVENTIL) (2.5 MG/3ML) 0.083% nebulizer solution Take 2.5 mg by nebulization every 6 (six) hours as needed.   albuterol (VENTOLIN HFA) 108 (90 Base) MCG/ACT inhaler Inhale 1-2 puffs into the lungs every 6 (six) hours as needed for wheezing or shortness of breath.   ALPRAZolam (XANAX) 0.5 MG tablet Take 1 tablet (0.5 mg total) by mouth 3 (three) times daily as needed. for anxiety   Ascorbic Acid (VITAMIN C) 1000 MG tablet  Take 1,000 mg by mouth daily.   Blood Glucose Monitoring Suppl DEVI 1 each by Does not apply route in the morning, at noon, and at bedtime. May substitute to any manufacturer covered by patient's insurance.   carvedilol (COREG) 6.25 MG tablet Take 6.25 mg by mouth 2 (two) times daily.   DEXILANT 60 MG capsule Take 1 capsule by mouth once daily   docusate sodium (COLACE) 100 MG capsule Take 100 mg by mouth daily.   Dulaglutide (TRULICITY) 3 MG/0.5ML SOPN INJECT 3 MG AS DIRECTED ONCE A WEEK.   DULoxetine (CYMBALTA) 60 MG capsule Take 2 capsules by mouth once daily    fexofenadine-pseudoephedrine (ALLEGRA-D 24) 180-240 MG 24 hr tablet Take 1 tablet by mouth every evening. For allergy and congestion   fluticasone (FLONASE) 50 MCG/ACT nasal spray SPRAY 2 SPRAYS INTO EACH NOSTRIL EVERY DAY (Patient taking differently: Place 2 sprays into both nostrils daily.)   gemfibrozil (LOPID) 600 MG tablet Take 1 tablet (600 mg total) by mouth 2 (two) times daily.   levothyroxine (SYNTHROID) 50 MCG tablet Take 1 tablet by mouth once daily   linaclotide (LINZESS) 290 MCG CAPS capsule Take 1 capsule (290 mcg total) by mouth daily as needed (constipation).   metoprolol succinate (TOPROL-XL) 50 MG 24 hr tablet TAKE 1 TABLET BY MOUTH DAILY. FOR BLOOD PRESSURE CONTROL   ondansetron (ZOFRAN) 4 MG tablet Take 1 tablet (4 mg total) by mouth every 8 (eight) hours as needed for nausea or vomiting.   QUEtiapine (SEROQUEL) 200 MG tablet TAKE 1 TABLET BY MOUTH AT BEDTIME FOR  NERVES   rosuvastatin (CRESTOR) 5 MG tablet Take 1 tablet (5 mg total) by mouth daily. For cholesterol   sildenafil (REVATIO) 20 MG tablet Take 2-5 tablets daily as needed for sex   SYMBICORT 160-4.5 MCG/ACT inhaler INHALE 2 PUFFS BY MOUTH TWICE DAILY FOR ASTHMA   tamsulosin (FLOMAX) 0.4 MG CAPS capsule Take 1 capsule (0.4 mg total) by mouth daily.   traZODone (DESYREL) 100 MG tablet Take 1 tablet (100 mg total) by mouth at bedtime.   Vitamin D, Ergocalciferol, (DRISDOL) 1.25 MG (50000 UNIT) CAPS capsule Take 1 capsule (50,000 Units total) by mouth every 7 (seven) days.   zinc gluconate 50 MG tablet Take 50 mg by mouth daily.                 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- no surgery   Depression    Diabetes mellitus without complication (HCC)    High cholesterol    Hypertension    Hypothyroidism    Low testosterone    Median nerve dysfunction    Radiculopathy of cervical region     Thyroid disease    Ulnar neuropathy at elbow    Weakness       Objective:      Wt Readings from Last 3 Encounters:  06/30/23 (!) 349 lb (158.3 kg)  04/23/23 (!) 339 lb (153.8 kg)  04/17/23 (!) 336 lb (152.4 kg)      Vital signs reviewed  06/30/2023  - Note at rest 02 sats  96% on RA   General appearance:    amb MO (by BMI) amb wm nad   HEENT : Oropharynx  clear      Nasal turbinates nl    NECK :  without  apparent JVD/ palpable Nodes/TM    LUNGS: no acc muscle use,  Nl contour chest which is clear to A and P bilaterally without cough on insp or exp maneuvers   CV:  RRR  no s3 or murmur or increase in P2, and no edema   ABD:  obese soft and nontende r  MS:    ext warm without deformities Or obvious joint restrictions  calf tenderness, cyanosis or clubbing    SKIN: warm and dry without lesions    NEURO:  alert, approp, nl sensorium      .          Assessment

## 2023-06-30 ENCOUNTER — Ambulatory Visit: Payer: Medicare HMO | Admitting: Internal Medicine

## 2023-06-30 ENCOUNTER — Encounter: Payer: Self-pay | Admitting: Internal Medicine

## 2023-06-30 VITALS — BP 142/85 | HR 75 | Ht 68.0 in | Wt 349.0 lb

## 2023-06-30 DIAGNOSIS — R0609 Other forms of dyspnea: Secondary | ICD-10-CM | POA: Diagnosis not present

## 2023-06-30 DIAGNOSIS — F17210 Nicotine dependence, cigarettes, uncomplicated: Secondary | ICD-10-CM

## 2023-06-30 NOTE — Assessment & Plan Note (Signed)
ERV reduced on pfts  06/20/23 c/w effects of MO   Body mass index is 53.07 kg/m.  -  trending up  Lab Results  Component Value Date   TSH 2.070 03/19/2023      Contributing to doe and risk of GERD/dvt/ PE >>>   reviewed the need and the process to achieve and maintain neg calorie balance > defer f/u primary care including intermittently monitoring thyroid status

## 2023-06-30 NOTE — Assessment & Plan Note (Signed)
Referred to [facility] program  06/30/2023    Counseled re importance of smoking cessation but did not meet time criteria for separate billing            Each maintenance medication was reviewed in detail including emphasizing most importantly the difference between maintenance and prns and under what circumstances the prns are to be triggered using an action plan format where appropriate.  Total time for H and P, chart review, counseling, reviewing hfa  device(s) and generating customized AVS unique to this office visit / same day charting  > 30 min final summary ov

## 2023-06-30 NOTE — Patient Instructions (Addendum)
Plan A = Automatic = Always=    symbicort 160  up to 2 puffs every 12 hours as needed    Plan B = Backup (to supplement plan A, not to replace it) Only use your albuterol inhaler as a rescue medication to be used if you can't catch your breath by resting or doing a relaxed purse lip breathing pattern.  - The less you use it, the better it will work when you need it. - Ok to use the inhaler up to 2 puffs  every 4 hours if you must but call for appointment if use goes up over your usual need - Don't leave home without it !!  (think of it like the spare tire for your car)      If you are satisfied with your treatment plan,  let your doctor know and he/she can either refill your medications or you can return here when your prescription runs out.     If in any way you are not 100% satisfied,  please tell us.  If 100% better, tell your friends!  Pulmonary follow up is as needed

## 2023-06-30 NOTE — Assessment & Plan Note (Addendum)
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  - 04/23/2023   Walked on RA  x  3  lap(s) =  approx 450  ft  @ mod fast pace, stopped due to end of study with lowest 02 sats 94% @ wt 339  - 04/23/2023  After extensive coaching inhaler device,  effectiveness =    75% (delayed insp p trigger)   - PFT's  06/20/23  FEV1 3.23 (87 % ) ratio 0.76  p 9 % improvement from saba p symbicort prior to study with DLCO  21.08 (92%)   and FV curve min concave  and ERV 55 at wt 335     >>>  06/30/2023  After extensive coaching inhaler device,  effectiveness =    80% (short ti)    So he does not have any copd and likely only mild asthma - Based on two studies from NEJM  378; 20 p 1865 (2018) and 380 : p2020-30 (2019) in pts with mild asthma it is reasonable to use   symbicort  80 or 160 "prn" flare in this setting but I emphasized this was only shown with symbicort and takes advantage of the rapid onset of action but is not the same as "rescue therapy" but can be stopped once the acute symptoms have resolved and the need for rescue has been minimized (< 2 x weekly)    Pulmonary f/u is as needed if PCP willing to refill meds

## 2023-07-02 ENCOUNTER — Ambulatory Visit: Payer: Medicare HMO | Admitting: Family Medicine

## 2023-07-02 ENCOUNTER — Encounter: Payer: Self-pay | Admitting: Family Medicine

## 2023-07-02 VITALS — BP 113/69 | HR 72 | Temp 97.4°F | Ht 68.0 in | Wt 351.0 lb

## 2023-07-02 DIAGNOSIS — I10 Essential (primary) hypertension: Secondary | ICD-10-CM

## 2023-07-02 DIAGNOSIS — E782 Mixed hyperlipidemia: Secondary | ICD-10-CM | POA: Diagnosis not present

## 2023-07-02 DIAGNOSIS — E038 Other specified hypothyroidism: Secondary | ICD-10-CM | POA: Diagnosis not present

## 2023-07-02 DIAGNOSIS — E662 Morbid (severe) obesity with alveolar hypoventilation: Secondary | ICD-10-CM | POA: Diagnosis not present

## 2023-07-02 DIAGNOSIS — E119 Type 2 diabetes mellitus without complications: Secondary | ICD-10-CM

## 2023-07-02 DIAGNOSIS — K279 Peptic ulcer, site unspecified, unspecified as acute or chronic, without hemorrhage or perforation: Secondary | ICD-10-CM | POA: Diagnosis not present

## 2023-07-02 DIAGNOSIS — Z6841 Body Mass Index (BMI) 40.0 and over, adult: Secondary | ICD-10-CM

## 2023-07-02 DIAGNOSIS — E66813 Obesity, class 3: Secondary | ICD-10-CM

## 2023-07-02 LAB — BAYER DCA HB A1C WAIVED: HB A1C (BAYER DCA - WAIVED): 5.7 % — ABNORMAL HIGH (ref 4.8–5.6)

## 2023-07-02 MED ORDER — TIRZEPATIDE 10 MG/0.5ML ~~LOC~~ SOAJ: 10.0000 mg | SUBCUTANEOUS | 1 refills | Status: DC

## 2023-07-02 NOTE — Progress Notes (Signed)
Subjective:  Patient ID: Roy [name],  male    DOB: 09/15/1971  Age: 52 y.o.    CC: Medical Management of Chronic Issues   HPI Roy [name] presents for  follow-up of hypertension. Patient has no history of headache chest pain or shortness of breath or recent cough. Patient also denies symptoms of TIA such as numbness weakness lateralizing. Patient denies side effects from medication. States taking it regularly.  Saw Dr. Sherene Sires who told him he had some asthma, but his main breathing issue is his weight. He was told he could decrease the symbicort to 2 puffs daily as a trial  Patient also  in for follow-up of elevated cholesterol. Doing well without complaints on current medication. Denies side effects  including myalgia and arthralgia and nausea. Also in today for liver function testing. Currently no chest pain, shortness of breath or other cardiovascular related symptoms noted.  Follow-up of diabetes. Patient does check blood sugar at home. Readings run between 90--120 fasting and similar post prandial. Patient denies symptoms such as excessive hunger or urinary frequency, excessive hunger, nausea No significant hypoglycemic spells noted. Medications reviewed. Pt reports taking them regularly. Pt. denies complication/adverse reaction today.    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   He has a past surgical history that includes Rotator cuff repair (Right); Biceps tendon repair (Right); Neck surgery; and Knee arthroscopy with lateral menisectomy (Left, 10/09/2021).   His family history includes Arrhythmia in his father; Asthma in his brother and daughter; Bipolar disorder in his brother; COPD in  his father; Fibromyalgia in his sister; Hypertension in his mother; Stroke (age of onset: 39) in his mother.He reports that he has been smoking cigarettes. He has a 16.5 pack-year smoking history. He quit smokeless tobacco use about 3 years ago.  His smokeless tobacco use included snuff and chew. He reports that he does not drink alcohol and does not use drugs.  Current Outpatient Medications on File Prior to Visit  Medication Sig Dispense Refill   ACCU-CHEK GUIDE test strip      acetaminophen (TYLENOL) 500 MG tablet Take 1,000 mg by mouth every 6 (six) hours as needed for headache (pain).      albuterol (PROVENTIL) (2.5 MG/3ML) 0.083% nebulizer solution Take 2.5 mg by nebulization every 6 (six) hours as needed.     albuterol (VENTOLIN HFA) 108 (90 Base) MCG/ACT inhaler Inhale 1-2 puffs into the lungs every 6 (six) hours as needed for wheezing or shortness of breath. 18 g 5   ALPRAZolam (XANAX) 0.5 MG tablet Take 1 tablet (0.5 mg total) by mouth 3 (three) times daily as needed. for anxiety 90 tablet 5   Ascorbic Acid (VITAMIN C) 1000 MG tablet Take 1,000 mg by mouth daily.     Blood Glucose Monitoring Suppl DEVI 1 each by Does not apply route in the morning, at noon, and at bedtime. May substitute to any manufacturer covered by patient's insurance. 1 each 0   carvedilol (COREG) 6.25 MG tablet Take 6.25 mg by mouth 2 (two) times daily.     DEXILANT 60 MG capsule Take 1 capsule by mouth once daily 90 capsule 0   docusate sodium (COLACE) 100 MG capsule Take 100 mg by mouth daily.     DULoxetine (CYMBALTA) 60 MG  capsule Take 2 capsules by mouth once daily 180 capsule 0   fexofenadine-pseudoephedrine (ALLEGRA-D 24) 180-240 MG 24 hr tablet Take 1 tablet by mouth every evening. For allergy and congestion 30 tablet 11   fluticasone (FLONASE) 50 MCG/ACT nasal spray SPRAY 2 SPRAYS INTO EACH NOSTRIL EVERY DAY (Patient taking differently: Place 2 sprays into both nostrils daily.) 48 mL 1   gemfibrozil (LOPID)  600 MG tablet Take 1 tablet (600 mg total) by mouth 2 (two) times daily. 180 tablet 3   levothyroxine (SYNTHROID) 50 MCG tablet Take 1 tablet by mouth once daily 90 tablet 2   linaclotide (LINZESS) 290 MCG CAPS capsule Take 1 capsule (290 mcg total) by mouth daily as needed (constipation). 90 capsule 3   metoprolol succinate (TOPROL-XL) 50 MG 24 hr tablet TAKE 1 TABLET BY MOUTH DAILY. FOR BLOOD PRESSURE CONTROL 90 tablet 1   ondansetron (ZOFRAN) 4 MG tablet Take 1 tablet (4 mg total) by mouth every 8 (eight) hours as needed for nausea or vomiting. 10 tablet 0   QUEtiapine (SEROQUEL) 200 MG tablet TAKE 1 TABLET BY MOUTH AT BEDTIME FOR  NERVES 90 tablet 2   rosuvastatin (CRESTOR) 5 MG tablet Take 1 tablet (5 mg total) by mouth daily. For cholesterol 90 tablet 3   sildenafil (REVATIO) 20 MG tablet Take 2-5 tablets daily as needed for sex 180 tablet 1   SYMBICORT 160-4.5 MCG/ACT inhaler INHALE 2 PUFFS BY MOUTH TWICE DAILY FOR ASTHMA 10.2 each 5   traZODone (DESYREL) 100 MG tablet TAKE 1 TABLET BY MOUTH EVERYDAY AT BEDTIME 90 tablet 3   Vitamin D, Ergocalciferol, (DRISDOL) 1.25 MG (50000 UNIT) CAPS capsule Take 1 capsule (50,000 Units total) by mouth every 7 (seven) days. 13 capsule 3   zinc gluconate 50 MG tablet Take 50 mg by mouth daily.     tamsulosin (FLOMAX) 0.4 MG CAPS capsule Take 1 capsule (0.4 mg total) by mouth daily. (Patient not taking: Reported on 07/02/2023) 30 capsule 3   [DISCONTINUED] famotidine (PEPCID) 20 MG tablet Take 1 tablet (20 mg total) by mouth 2 (two) times daily for 14 days. 28 tablet 0   No current facility-administered medications on file prior to visit.    ROS Review of Systems  Constitutional: Negative.   HENT: Negative.    Eyes:  Negative for visual disturbance.  Respiratory:  Positive for shortness of breath. Negative for cough.   Cardiovascular:  Negative for chest pain and leg swelling.  Gastrointestinal:  Negative for abdominal pain, diarrhea, nausea and  vomiting.  Genitourinary:  Negative for difficulty urinating.  Musculoskeletal:  Positive for arthralgias (hips, knees, limits exercise). Negative for myalgias.  Skin:  Negative for rash.  Neurological:  Negative for headaches.  Psychiatric/Behavioral:  Negative for sleep disturbance.     Objective:  BP 113/69   Pulse 72   Temp (!) 97.4 F (36.3 C)   Ht 5\' 8"  (1.727 m)   Wt (!) 351 lb (159.2 kg)   SpO2 96%   BMI 53.37 kg/m   BP Readings from Last 3 Encounters:  07/02/23 113/69  06/30/23 (!) 142/85  04/23/23 122/85    Wt Readings from Last 3 Encounters:  07/02/23 (!) 351 lb (159.2 kg)  06/30/23 (!) 349 lb (158.3 kg)  04/23/23 (!) 339 lb (153.8 kg)     Physical Exam Vitals reviewed.  Constitutional:      Appearance: He is well-developed. He is obese.  HENT:     Head: Normocephalic and atraumatic.  Right Ear: External ear normal.     Left Ear: External ear normal.     Mouth/Throat:     Pharynx: No oropharyngeal exudate or posterior oropharyngeal erythema.  Eyes:     Pupils: Pupils are equal, round, and reactive to light.  Cardiovascular:     Rate and Rhythm: Normal rate and regular rhythm.     Heart sounds: No murmur heard. Pulmonary:     Effort: No respiratory distress.     Breath sounds: Normal breath sounds.  Musculoskeletal:     Cervical back: Normal range of motion and neck supple.  Neurological:     Mental Status: He is alert and oriented to person, place, and time.     Diabetic Foot Exam - Simple   No data filed     Lab Results  Component Value Date   HGBA1C 5.8 (H) 03/19/2023   HGBA1C 5.7 (H) 12/16/2022   HGBA1C 7.0 (H) 09/05/2022    Assessment & Plan:   Alexes was seen today for medical management of chronic issues.  Diagnoses and all orders for this visit:  Benign essential HTN -     CBC with Differential/Platelet -     CMP14+EGFR  Other specified hypothyroidism -     TSH + free T4  New onset type 2 diabetes mellitus  (HCC) -     Bayer DCA Hb A1c Waived  Mixed hyperlipidemia -     Lipid panel  PUD (peptic ulcer disease)  Class 3 obesity with alveolar hypoventilation, serious comorbidity, and body mass index (BMI) of 50.0 to 59.9 in adult [facility])  Other orders -     tirzepatide (MOUNJARO) 10 MG/0.5ML Pen; Inject 10 mg into the skin once a week.   I have discontinued Ishmail Gerbracht. Ekblad's Trulicity. I am also having him start on tirzepatide. Additionally, I am having him maintain his acetaminophen, sildenafil, vitamin C, zinc gluconate, gemfibrozil, linaclotide, docusate sodium, albuterol, rosuvastatin, Vitamin D (Ergocalciferol), Blood Glucose Monitoring Suppl, fluticasone, ondansetron, fexofenadine-pseudoephedrine, Symbicort, ALPRAZolam, metoprolol succinate, QUEtiapine, Accu-Chek Guide, tamsulosin, DULoxetine, levothyroxine, Dexilant, traZODone, carvedilol, and albuterol.  Meds ordered this encounter  Medications   tirzepatide (MOUNJARO) 10 MG/0.5ML Pen    Sig: Inject 10 mg into the skin once a week.    Dispense:  6 mL    Refill:  1   Discussed weight loss strategy including diet diary and daily exercise.  Follow-up: Return in about 3 months (around 10/02/2023).  Mechele Claude, M.D.

## 2023-07-03 LAB — CBC WITH DIFFERENTIAL/PLATELET
Basophils Absolute: 0 10*3/uL (ref 0.0–0.2)
Basos: 0 %
EOS (ABSOLUTE): 0.1 10*3/uL (ref 0.0–0.4)
Eos: 1 %
Hematocrit: 42.3 % (ref 37.5–51.0)
Hemoglobin: 13.9 g/dL (ref 13.0–17.7)
Immature Grans (Abs): 0 10*3/uL (ref 0.0–0.1)
Immature Granulocytes: 0 %
Lymphocytes Absolute: 2 10*3/uL (ref 0.7–3.1)
Lymphs: 32 %
MCH: 31.3 pg (ref 26.6–33.0)
MCHC: 32.9 g/dL (ref 31.5–35.7)
MCV: 95 fL (ref 79–97)
Monocytes Absolute: 0.5 10*3/uL (ref 0.1–0.9)
Monocytes: 8 %
Neutrophils Absolute: 3.7 10*3/uL (ref 1.4–7.0)
Neutrophils: 59 %
Platelets: 219 10*3/uL (ref 150–450)
RBC: 4.44 x10E6/uL (ref 4.14–5.80)
RDW: 13.1 % (ref 11.6–15.4)
WBC: 6.3 10*3/uL (ref 3.4–10.8)

## 2023-07-03 LAB — TSH+FREE T4
Free T4: 0.95 ng/dL (ref 0.82–1.77)
TSH: 1.92 u[IU]/mL (ref 0.450–4.500)

## 2023-07-03 LAB — CMP14+EGFR
ALT: 13 IU/L (ref 0–44)
AST: 14 IU/L (ref 0–40)
Albumin: 4.4 g/dL (ref 4.1–5.1)
Alkaline Phosphatase: 76 IU/L (ref 44–121)
BUN/Creatinine Ratio: 6 — ABNORMAL LOW (ref 9–20)
BUN: 5 mg/dL — ABNORMAL LOW (ref 6–24)
Bilirubin Total: 0.3 mg/dL (ref 0.0–1.2)
CO2: 21 mmol/L (ref 20–29)
Calcium: 9.1 mg/dL (ref 8.7–10.2)
Chloride: 101 mmol/L (ref 96–106)
Creatinine, Ser: 0.84 mg/dL (ref 0.76–1.27)
Globulin, Total: 2.2 g/dL (ref 1.5–4.5)
Glucose: 108 mg/dL — ABNORMAL HIGH (ref 70–99)
Potassium: 4.7 mmol/L (ref 3.5–5.2)
Sodium: 139 mmol/L (ref 134–144)
Total Protein: 6.6 g/dL (ref 6.0–8.5)
eGFR: 106 mL/min/{1.73_m2} (ref >59)

## 2023-07-03 LAB — LIPID PANEL
Cholesterol, Total: 115 mg/dL (ref 100–199)
HDL: 39 mg/dL — ABNORMAL LOW (ref >39)
LDL CALC COMMENT:: 2.9 ratio (ref 0.0–5.0)
LDL Chol Calc (NIH): 49 mg/dL (ref 0–99)
Triglycerides: 156 mg/dL — ABNORMAL HIGH (ref 0–149)
VLDL Cholesterol Cal: 27 mg/dL (ref 5–40)

## 2023-07-03 NOTE — Progress Notes (Signed)
Hello Roy [name],  Your lab result is normal and/or stable.Some minor variations that are not significant are commonly marked abnormal, but do not represent any medical problem for you.  Best regards, Tove Wideman, M.D.

## 2023-07-21 ENCOUNTER — Other Ambulatory Visit: Payer: Self-pay | Admitting: Family Medicine

## 2023-08-10 ENCOUNTER — Other Ambulatory Visit: Payer: Self-pay | Admitting: Family Medicine

## 2023-08-10 DIAGNOSIS — J418 Mixed simple and mucopurulent chronic bronchitis: Secondary | ICD-10-CM

## 2023-08-10 DIAGNOSIS — R399 Unspecified symptoms and signs involving the genitourinary system: Secondary | ICD-10-CM

## 2023-08-11 ENCOUNTER — Other Ambulatory Visit: Payer: Self-pay

## 2023-08-11 ENCOUNTER — Encounter (HOSPITAL_COMMUNITY): Payer: Self-pay | Admitting: Emergency Medicine

## 2023-08-11 ENCOUNTER — Emergency Department (HOSPITAL_COMMUNITY): Payer: Medicare HMO

## 2023-08-11 ENCOUNTER — Emergency Department (HOSPITAL_COMMUNITY)
Admission: EM | Admit: 2023-08-11 | Discharge: 2023-08-11 | Disposition: A | Discharge status: Home / Self Care | Payer: Medicare HMO | Attending: Emergency Medicine | Admitting: Emergency Medicine

## 2023-08-11 DIAGNOSIS — K5900 Constipation, unspecified: Secondary | ICD-10-CM | POA: Diagnosis not present

## 2023-08-11 DIAGNOSIS — K5641 Fecal impaction: Secondary | ICD-10-CM | POA: Diagnosis not present

## 2023-08-11 DIAGNOSIS — R1032 Left lower quadrant pain: Secondary | ICD-10-CM | POA: Diagnosis present

## 2023-08-11 LAB — COMPREHENSIVE METABOLIC PANEL
ALT: 25 U/L (ref 0–44)
AST: 21 U/L (ref 15–41)
Albumin: 4.5 g/dL (ref 3.5–5.0)
Alkaline Phosphatase: 66 U/L (ref 38–126)
Anion gap: 12 (ref 5–15)
BUN: 9 mg/dL (ref 6–20)
CO2: 23 mmol/L (ref 22–32)
Calcium: 9.2 mg/dL (ref 8.9–10.3)
Chloride: 97 mmol/L — ABNORMAL LOW (ref 98–111)
Creatinine, Ser: 1 mg/dL (ref 0.61–1.24)
GFR, Estimated: >60 mL/min (ref >60)
Glucose, Bld: 104 mg/dL — ABNORMAL HIGH (ref 70–99)
Potassium: 3.9 mmol/L (ref 3.5–5.1)
Sodium: 132 mmol/L — ABNORMAL LOW (ref 135–145)
Total Bilirubin: 0.8 mg/dL (ref <1.2)
Total Protein: 8.3 g/dL — ABNORMAL HIGH (ref 6.5–8.1)

## 2023-08-11 LAB — URINALYSIS, ROUTINE W REFLEX MICROSCOPIC
Bilirubin Urine: NEGATIVE
Glucose, UA: NEGATIVE mg/dL
Hgb urine dipstick: NEGATIVE
Ketones, ur: NEGATIVE mg/dL
Leukocytes,Ua: NEGATIVE
Nitrite: NEGATIVE
Protein, ur: NEGATIVE mg/dL
Specific Gravity, Urine: 1.006 (ref 1.005–1.030)
pH: 7 (ref 5.0–8.0)

## 2023-08-11 LAB — LIPASE, BLOOD: Lipase: 55 U/L — ABNORMAL HIGH (ref 11–51)

## 2023-08-11 LAB — CBC WITH DIFFERENTIAL/PLATELET
Abs Immature Granulocytes: 0.04 10*3/uL (ref 0.00–0.07)
Basophils Absolute: 0 10*3/uL (ref 0.0–0.1)
Basophils Relative: 0 %
Eosinophils Absolute: 0.1 10*3/uL (ref 0.0–0.5)
Eosinophils Relative: 1 %
HCT: 45.4 % (ref 39.0–52.0)
Hemoglobin: 15.5 g/dL (ref 13.0–17.0)
Immature Granulocytes: 1 %
Lymphocytes Relative: 18 %
Lymphs Abs: 1.5 10*3/uL (ref 0.7–4.0)
MCH: 31.8 pg (ref 26.0–34.0)
MCHC: 34.1 g/dL (ref 30.0–36.0)
MCV: 93 fL (ref 80.0–100.0)
Monocytes Absolute: 0.4 10*3/uL (ref 0.1–1.0)
Monocytes Relative: 4 %
Neutro Abs: 6.7 10*3/uL (ref 1.7–7.7)
Neutrophils Relative %: 76 %
Platelets: 210 10*3/uL (ref 150–400)
RBC: 4.88 MIL/uL (ref 4.22–5.81)
RDW: 13.2 % (ref 11.5–15.5)
WBC: 8.7 10*3/uL (ref 4.0–10.5)
nRBC: 0 % (ref 0.0–0.2)

## 2023-08-11 MED ORDER — POLYETHYLENE GLYCOL 3350 17 GM/SCOOP PO POWD: 1.0000 | Freq: Once | ORAL | 0 refills | Status: AC

## 2023-08-11 MED ORDER — FLEET ENEMA RE ENEM
1.0000 | ENEMA | Freq: Once | RECTAL | Status: AC
  Administered 2023-08-11: 1 via RECTAL

## 2023-08-11 NOTE — ED Notes (Addendum)
Fleet given to pt by this RN via pt request of not wanting to do it independently. Pt laying in bed and instructed to hold enema until feels the urge to have a BM.

## 2023-08-11 NOTE — ED Provider Notes (Signed)
[place] EMERGENCY DEPARTMENT AT [facility] Provider Note   CSN: [id number] Arrival date & time: 08/11/23  1118     History  Chief Complaint  Patient presents with   Abdominal Pain    Roy [name] is a 52 y.o. male, hx of IBS-C, presents to the ED secondary to no bowel meant for the last 1.5 weeks.  He states that for the last week and a half, after starting [facility], he has had reduced stools,/no stools.  He states that he typically has a bowel movement every 3 or 4 days, has a history of IBS C, which she is on Linzess, but is having more difficulty stooling.  States he is straining coming having some bright red blood, when straining.  Notes that he is not having any severe abdominal pain, but just some discomfort to the left lower quadrant.  Denies any nausea, vomiting, no reduced appetite.  Has tried Colace without improvement.     Home Medications Prior to Admission medications   Medication Sig Start Date End Date Taking? Authorizing Provider  polyethylene glycol powder (GLYCOLAX/MIRALAX) 17 GM/SCOOP powder Take 255 g by mouth once for 1 dose. W/64 ounces of gatorade 08/11/23 08/11/23 Yes Devlynn Knoff L, PA  ACCU-CHEK GUIDE test strip  04/09/23   [provider]  acetaminophen (TYLENOL) 500 MG tablet Take 1,000 mg by mouth every 6 (six) hours as needed for headache (pain).     [provider]  albuterol (PROVENTIL) (2.5 MG/3ML) 0.083% nebulizer solution Take 2.5 mg by nebulization every 6 (six) hours as needed. 02/04/23   [provider]  albuterol (VENTOLIN HFA) 108 (90 Base) MCG/ACT inhaler Inhale 1-2 puffs into the lungs every 6 (six) hours as needed for wheezing or shortness of breath. 09/05/22   Mechele Claude, MD  ALPRAZolam Prudy Feeler) 0.5 MG tablet Take 1 tablet (0.5 mg total) by mouth 3 (three) times daily as needed. for anxiety 03/03/23   Mechele Claude, MD  Ascorbic Acid (VITAMIN C) 1000 MG tablet Take 1,000 mg by mouth daily.     [provider]  Blood Glucose Monitoring Suppl DEVI 1 each by Does not apply route in the morning, at noon, and at bedtime. May substitute to any manufacturer covered by patient's insurance. 10/16/22   Mechele Claude, MD  carvedilol (COREG) 6.25 MG tablet Take 6.25 mg by mouth 2 (two) times daily. 04/25/23   [provider]  DEXILANT 60 MG capsule Take 1 capsule by mouth once daily 06/16/23   Dettinger, Elige Radon, MD  docusate sodium (COLACE) 100 MG capsule Take 100 mg by mouth daily.    [provider]  DULoxetine (CYMBALTA) 60 MG capsule Take 2 capsules by mouth once daily 05/23/23   Mechele Claude, MD  fexofenadine-pseudoephedrine (ALLEGRA-D 24) 180-240 MG 24 hr tablet Take 1 tablet by mouth every evening. For allergy and congestion 12/25/22   Mechele Claude, MD  fluticasone (FLONASE) 50 MCG/ACT nasal spray SPRAY 2 SPRAYS INTO EACH NOSTRIL EVERY DAY Patient taking differently: Place 2 sprays into both nostrils daily. 12/01/22   Mechele Claude, MD  gemfibrozil (LOPID) 600 MG tablet Take 1 tablet by mouth twice daily 07/21/23   Mechele Claude, MD  levothyroxine (SYNTHROID) 50 MCG tablet Take 1 tablet by mouth once daily 06/12/23   Mechele Claude, MD  linaclotide [facility]) 290 MCG CAPS capsule Take 1 capsule (290 mcg total) by mouth daily as needed (constipation). 06/05/22   Mechele Claude, MD  metoprolol succinate (TOPROL-XL) 50 MG 24  hr tablet TAKE 1 TABLET BY MOUTH DAILY. FOR BLOOD PRESSURE CONTROL 03/19/23   Mechele Claude, MD  ondansetron (ZOFRAN) 4 MG tablet Take 1 tablet (4 mg total) by mouth every 8 (eight) hours as needed for nausea or vomiting. 12/19/22   Arthor Captain, PA-C  QUEtiapine (SEROQUEL) 200 MG tablet TAKE 1 TABLET BY MOUTH AT BEDTIME FOR  NERVES 03/19/23   Mechele Claude, MD  rosuvastatin (CRESTOR) 5 MG tablet Take 1 tablet (5 mg total) by mouth daily. For cholesterol 09/05/22   Mechele Claude, MD  sildenafil (REVATIO) 20 MG tablet Take 2-5 tablets daily as needed  for sex 12/07/19   Mechele Claude, MD  SYMBICORT 160-4.5 MCG/ACT inhaler INHALE 2 PUFFS BY MOUTH TWICE DAILY FOR ASTHMA 08/11/23   Mechele Claude, MD  tamsulosin (FLOMAX) 0.4 MG CAPS capsule TAKE 1 CAPSULE BY MOUTH EVERY DAY 08/11/23   Gabriel Earing, FNP  tirzepatide [facility]) 10 MG/0.5ML Pen Inject 10 mg into the skin once a week. 07/02/23   Mechele Claude, MD  traZODone (DESYREL) 100 MG tablet TAKE 1 TABLET BY MOUTH EVERYDAY AT BEDTIME 06/30/23   Mechele Claude, MD  Vitamin D, Ergocalciferol, (DRISDOL) 1.25 MG (50000 UNIT) CAPS capsule Take 1 capsule (50,000 Units total) by mouth every 7 (seven) days. 09/05/22 09/04/23  Mechele Claude, MD  zinc gluconate 50 MG tablet Take 50 mg by mouth daily.    [provider]  famotidine (PEPCID) 20 MG tablet Take 1 tablet (20 mg total) by mouth 2 (two) times daily for 14 days. 06/09/19 06/28/20  Mechele Claude, MD      Allergies    Penicillins and Chantix [varenicline]    Review of Systems   Review of Systems  Gastrointestinal:  Positive for abdominal pain and constipation. Negative for nausea and vomiting.    Physical Exam Updated Vital Signs BP (!) 151/91   Pulse 72   Temp 98.8 F (37.1 C) (Oral)   Resp 18   Ht 5\' 8"  (1.727 m)   Wt (!) 159.2 kg   SpO2 98%   BMI 53.37 kg/m  Physical Exam Vitals and nursing note reviewed. Exam conducted with a chaperone present.  Constitutional:      General: He is not in acute distress.    Appearance: He is well-developed.  HENT:     Head: Normocephalic and atraumatic.  Eyes:     Conjunctiva/sclera: Conjunctivae normal.  Cardiovascular:     Rate and Rhythm: Normal rate and regular rhythm.     Heart sounds: No murmur heard. Pulmonary:     Effort: Pulmonary effort is normal. No respiratory distress.     Breath sounds: Normal breath sounds.  Abdominal:     Palpations: Abdomen is soft.     Tenderness: There is no abdominal tenderness.  Genitourinary:    Comments: No tenderness to palpation  of anal canal, Daven Montz external hernia, around 9:00.  No blood, on rectal exam.  No fecal impaction noted. Musculoskeletal:        General: No swelling.     Cervical back: Neck supple.  Skin:    General: Skin is warm and dry.     Capillary Refill: Capillary refill takes less than 2 seconds.  Neurological:     Mental Status: He is alert.  Psychiatric:        Mood and Affect: Mood normal.     ED Results / Procedures / Treatments   Labs (all labs ordered are listed, but only abnormal results are displayed) Labs Reviewed  COMPREHENSIVE METABOLIC PANEL - Abnormal; Notable for the following components:      Result Value   Sodium 132 (*)    Chloride 97 (*)    Glucose, Bld 104 (*)    Total Protein 8.3 (*)    All other components within normal limits  LIPASE, BLOOD - Abnormal; Notable for the following components:   Lipase 55 (*)    All other components within normal limits  CBC WITH DIFFERENTIAL/PLATELET  URINALYSIS, ROUTINE W REFLEX MICROSCOPIC    EKG None  Radiology DG Abdomen 1 View  Result Date: 08/11/2023 CLINICAL DATA:  No BM in 1-2 weeks. EXAM: ABDOMEN - 1 VIEW COMPARISON:  CT 12/19/2022 FINDINGS: Moderate stool burden in the rectosigmoid colon. Nonobstructive bowel gas pattern. No organomegaly or free air. No suspicious calcification. IMPRESSION: Moderate stool burden.  No acute findings. Electronically Signed   By: Charlett Nose M.D.   On: 08/11/2023 21:45    Procedures Procedures    Medications Ordered in ED Medications  sodium phosphate (FLEET) enema 1 enema (1 enema Rectal Given 08/11/23 2001)    ED Course/ Medical Decision Making/ A&P                                 Medical Decision Making Patient is a 52 year old male, here for constipation x 1.5 weeks, he denies any abdominal pain, other than a little bit of tenderness to the left lower quadrant, and is not having any nausea, vomiting, or difficulty passing gas.  His abdomen is fairly soft.  Will obtain a  KUB, blood work, for further evaluation.  Will also trial Fleet enema  Amount and/or Complexity of Data Reviewed Labs:     Details: Unremarkable Radiology: ordered.    Details: Moderate stool burden Discussion of management or test interpretation with external provider(s): Discussed with patient, he had a very large bowel movement, and clog the toilet per the nurse after the enema.  He is feeling much better.  KUB prior to enema, just showed moderate stool burden, blood work is reassuring.  He is not having any kind of bowel obstruction symptoms including nausea, vomiting inability to pass gas, and he was able to have a bowel movement.  Sent home with a bowel prep, to help clean them out.  Discussed return precautions he voiced understanding  Risk OTC drugs.  Final Clinical Impression(s) / ED Diagnoses Final diagnoses:  Constipation, unspecified constipation type    Rx / DC Orders ED Discharge Orders          Ordered    polyethylene glycol powder (GLYCOLAX/MIRALAX) 17 GM/SCOOP powder   Once        08/11/23 2209              Pete Pelt, [place] 08/11/23 2211    Linwood Dibbles, MD 08/12/23 [phone]

## 2023-08-11 NOTE — ED Notes (Signed)
Pt had large solid BM

## 2023-08-11 NOTE — ED Notes (Signed)
Patient transported to CT 

## 2023-08-11 NOTE — ED Triage Notes (Signed)
Pt c/o left abd pain with constipation, reports clear liquid and blood when attempts to have a BM, last BM over 1 week, has not taken any OTC meds

## 2023-08-11 NOTE — Discharge Instructions (Addendum)
Please follow-up with your primary care doctor, I prescribed you a bowel prep, to help clean you out.  You will take half a bottle of MiraLAX, 64 ounces of Gatorade, and drink the whole container over the period of the day.  This should clean out your bowels.  And then you should take MiraLAX as needed, for constipation.  If you are not having a bowel movement in 2 to 3 days please take MiraLAX

## 2023-08-11 NOTE — ED Provider Triage Note (Signed)
Emergency Medicine Provider Triage Evaluation Note  Roy [name] , a 52 y.o. male  was evaluated in triage.  Pt complains of constipation and left lower quadrant abdominal pain.  History of constipation but never this bad.  States he was straining so hard this morning he had a very small amount of water, and a slight amount of blood but denies heavy bleeding he is not on blood thinners.  He states he is worried about taking presuming things worth.  He reports he had diverticulosis on prior colonoscopies.  Review of Systems  Positive: Abdominal pain, constipation, rectal bleeding Negative: Nausea vomiting or fever  Physical Exam  BP (!) 125/92   Pulse 69   Temp 98.8 F (37.1 C) (Oral)   Resp 18   Ht 5\' 8"  (1.727 m)   Wt (!) 159.2 kg   SpO2 96%   BMI 53.37 kg/m  Gen:   Awake, no distress   Resp:  Normal effort  MSK:   Moves extremities without difficulty  Other:    Medical Decision Making  Medically screening exam initiated at 1:35 PM.  Appropriate orders placed.  CALLIN GRISE was informed that the remainder of the evaluation will be completed by another provider, this initial triage assessment does not replace that evaluation, and the importance of remaining in the ED until their evaluation is complete.     Ma Rings, [place] 08/11/23 1343

## 2023-08-12 ENCOUNTER — Telehealth: Payer: Self-pay

## 2023-08-12 NOTE — Transitions of Care (Post Inpatient/ED Visit) (Signed)
08/12/2023  Name: Roy [name] MRN: [id number] DOB: [birth year]  Today's TOC FU Call Status: Today's TOC FU Call Status:: Successful TOC FU Call Completed TOC FU Call Complete Date: 08/12/23 Patient's Name and Date of Birth confirmed.  Transition Care Management Follow-up Telephone Call Date of Discharge: 08/11/23 Dis[facility] Type of Discharge: Emergency Department Reason for ED Visit: Other: (constipation) How have you been since you were released from the hospital?: Same Any questions or concerns?: No  Items Reviewed: Did you receive and understand the discharge instructions provided?: Yes Medications obtained,verified, and reconciled?: Yes (Medications Reviewed) Any new allergies since your discharge?: No Dietary orders reviewed?: Yes Do you have support at home?: No  Medications Reviewed Today: Medications Reviewed Today     Reviewed by Karena Addison, LPN (Licensed Practical Nurse) on 08/12/23 at 1425  Med List Status: <None>   Medication Order Taking? Sig Documenting Provider Last Dose Status Informant  ACCU-CHEK GUIDE test strip [id number] No  [provider] Taking Active   acetaminophen (TYLENOL) 500 MG tablet [id number] No Take 1,000 mg by mouth every 6 (six) hours as needed for headache (pain).  [provider] Taking Active Self, Pharmacy Records  albuterol (PROVENTIL) (2.5 MG/3ML) 0.083% nebulizer solution [id number] No Take 2.5 mg by nebulization every 6 (six) hours as needed. [provider] Taking Active   albuterol (VENTOLIN HFA) 108 (90 Base) MCG/ACT inhaler [id number] No Inhale 1-2 puffs into the lungs every 6 (six) hours as needed for wheezing or shortness of breath. Mechele Claude, MD Taking Active Self, Pharmacy Records  ALPRAZolam Prudy Feeler) 0.5 MG tablet [id number] No Take 1 tablet (0.5 mg total) by mouth 3 (three) times daily as needed. for anxiety Mechele Claude, MD Taking Active   Ascorbic Acid (VITAMIN C)  1000 MG tablet [id number] No Take 1,000 mg by mouth daily. [provider] Taking Active Self, Pharmacy Records  Blood Glucose Monitoring Suppl DEVI [id number] No 1 each by Does not apply route in the morning, at noon, and at bedtime. May substitute to any manufacturer covered by patient's insurance. Mechele Claude, MD Taking Active Self, Pharmacy Records  carvedilol (COREG) 6.25 MG tablet [id number] No Take 6.25 mg by mouth 2 (two) times daily. [provider] Taking Active   DEXILANT 60 MG capsule [id number] No Take 1 capsule by mouth once daily Dettinger, Elige Radon, MD Taking Active   docusate sodium (COLACE) 100 MG capsule [id number] No Take 100 mg by mouth daily. [provider] Taking Active Self, Pharmacy Records  DULoxetine (CYMBALTA) 60 MG capsule [id number] No Take 2 capsules by mouth once daily Mechele Claude, MD Taking Active   Discontinued 06/02/20 1438 fexofenadine-pseudoephedrine (ALLEGRA-D 24) 180-240 MG 24 hr tablet [id number] No Take 1 tablet by mouth every evening. For allergy and congestion Mechele Claude, MD Taking Active   fluticasone (FLONASE) 50 MCG/ACT nasal spray [id number] No SPRAY 2 SPRAYS INTO EACH NOSTRIL EVERY DAY  Patient taking differently: Place 2 sprays into both nostrils daily.   Mechele Claude, MD Taking Active Self, Pharmacy Records  gemfibrozil (LOPID) 600 MG tablet [id number]  Take 1 tablet by mouth twice daily Mechele Claude, MD  Active   levothyroxine (SYNTHROID) 50 MCG tablet [id number] No Take 1 tablet by mouth once daily Mechele Claude, MD Taking Active   linaclotide [facility]) 290 MCG CAPS capsule [id number] No Take 1 capsule (290 mcg total) by mouth daily as needed (constipation). Mechele Claude, MD Taking Active Self, Pharmacy Records  metoprolol succinate (TOPROL-XL) 50 MG  24 hr tablet [id number] No TAKE 1 TABLET BY MOUTH DAILY. FOR BLOOD PRESSURE CONTROL Mechele Claude, MD Taking Active   ondansetron [facility]) 4 MG tablet [id number] No Take 1  tablet (4 mg total) by mouth every 8 (eight) hours as needed for nausea or vomiting. Arthor Captain, PA-C Taking Active   QUEtiapine (SEROQUEL) 200 MG tablet [id number] No TAKE 1 TABLET BY MOUTH AT BEDTIME FOR  NERVES Mechele Claude, MD Taking Active   rosuvastatin (CRESTOR) 5 MG tablet [id number] No Take 1 tablet (5 mg total) by mouth daily. For cholesterol Mechele Claude, MD Taking Active Self, Pharmacy Records  sildenafil (REVATIO) 20 MG tablet [id number] No Take 2-5 tablets daily as needed for sex Mechele Claude, MD Taking Active Self, Pharmacy Records  SYMBICORT 160-4.5 MCG/ACT inhaler [id number]  INHALE 2 PUFFS BY MOUTH TWICE DAILY FOR ASTHMA Mechele Claude, MD  Active   tamsulosin (FLOMAX) 0.4 MG CAPS capsule [id number]  TAKE 1 CAPSULE BY MOUTH EVERY DAY Gabriel Earing, FNP  Active   tirzepatide [facility]) 10 MG/0.5ML Pen [id number]  Inject 10 mg into the skin once a week. Mechele Claude, MD  Active   traZODone (DESYREL) 100 MG tablet [id number] No TAKE 1 TABLET BY MOUTH EVERYDAY AT BEDTIME Mechele Claude, MD Taking Active   Vitamin D, Ergocalciferol, (DRISDOL) 1.25 MG (50000 UNIT) CAPS capsule [id number] No Take 1 capsule (50,000 Units total) by mouth every 7 (seven) days. Mechele Claude, MD Taking Active Self, Pharmacy Records  zinc gluconate 50 MG tablet [id number] No Take 50 mg by mouth daily. [provider] Taking Active Self, Pharmacy Records            Home Care and Equipment/Supplies: Were Home Health Services Ordered?: NA Any new equipment or medical supplies ordered?: NA  Functional Questionnaire: Do you need assistance with bathing/showering or dressing?: No Do you need assistance with meal preparation?: No Do you need assistance with eating?: No Do you have difficulty maintaining continence: No Do you need assistance with getting out of bed/getting out of a chair/moving?: No Do you have difficulty managing or taking your medications?: No  Follow up appointments  reviewed: PCP Follow-up appointment confirmed?: Yes Date of PCP follow-up appointment?: 08/13/23 Follow-up Provider: [facility] Follow-up appointment confirmed?: NA Do you need transportation to your follow-up appointment?: No Do you understand care options if your condition(s) worsen?: Yes-patient verbalized understanding    SIGNATURE Karena Addison, LPN [facility] Nurse Health Advisor Direct Dial [phone]

## 2023-08-13 ENCOUNTER — Encounter: Payer: Self-pay | Admitting: Family Medicine

## 2023-08-13 ENCOUNTER — Ambulatory Visit (INDEPENDENT_AMBULATORY_CARE_PROVIDER_SITE_OTHER): Payer: Medicare HMO | Admitting: Family Medicine

## 2023-08-13 ENCOUNTER — Inpatient Hospital Stay: Payer: Medicare HMO

## 2023-08-13 VITALS — BP 133/85 | HR 79 | Temp 97.2°F | Ht 68.0 in | Wt 336.2 lb

## 2023-08-13 DIAGNOSIS — R7303 Prediabetes: Secondary | ICD-10-CM

## 2023-08-13 DIAGNOSIS — F411 Generalized anxiety disorder: Secondary | ICD-10-CM | POA: Diagnosis not present

## 2023-08-13 DIAGNOSIS — K5903 Drug induced constipation: Secondary | ICD-10-CM | POA: Diagnosis not present

## 2023-08-13 MED ORDER — ALPRAZOLAM 0.5 MG PO TABS: 0.5000 mg | ORAL_TABLET | Freq: Three times a day (TID) | ORAL | 5 refills | Status: DC | PRN

## 2023-08-13 MED ORDER — LINACLOTIDE 290 MCG PO CAPS: 290.0000 ug | ORAL_CAPSULE | Freq: Every day | ORAL | 3 refills | Status: AC | PRN

## 2023-08-13 NOTE — Progress Notes (Signed)
Subjective:  Patient ID: Roy [name], male    DOB: 28-Jun-1971  Age: 52 y.o. MRN: [id number]  CC: ER FOLLOW UP (CONSTIPATION)   HPI Roy [name] presents for constipation. Had enema at E.D. yesterday and gven a colonoscopy prep with only oderate result. Had been I pain at [facility]. That has resolved, but still feels "full." Pt. Notes diminished Bms for weeks. No BM for several days prior to yesterdays E.D. visit. Sometimes passes blood with BM. Taking Mounjaro. Wants to continue. Not taking the Linzess because it is harsh. He used it prn only, never on schedule.   Due refills of xanax. Helps with severe anxiety.     08/13/2023    9:39 AM 07/02/2023    8:27 AM 04/17/2023    3:16 PM 03/21/2023   10:15 AM  GAD 7 : Generalized Anxiety Score  Nervous, Anxious, on Edge 1 1 1  0  Control/stop worrying 1 1 0 0  Worry too much - different things 1 1 1 1   Trouble relaxing 0 0 0 0  Restless 0 0 0 0  Easily annoyed or irritable 1 1 1 1   Afraid - awful might happen 1 1 1 1   Total GAD 7 Score 5 5 4 3   Anxiety Difficulty Not difficult at all Not difficult at all Not difficult at all Not difficult at all         08/13/2023    9:38 AM 08/13/2023    9:22 AM 07/02/2023    8:26 AM  Depression screen PHQ 2/9  Decreased Interest 1 0 1  Down, Depressed, Hopeless 1 0 1  PHQ - 2 Score 2 0 2  Altered sleeping 0  0  Tired, decreased energy 1  1  Change in appetite 1  0  Feeling bad or failure about yourself  0  0  Trouble concentrating 0  0  Moving slowly or fidgety/restless 0  0  Suicidal thoughts 0  0  PHQ-9 Score 4  3  Difficult doing work/chores Not difficult at all  Not difficult at all    History Roy [name] has a past medical history of Anxiety, Asthma, 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   He has a past surgical history that includes Rotator cuff repair (Right); Biceps tendon repair (Right); Neck surgery; and Knee arthroscopy with lateral menisectomy (Left, 10/09/2021).   His family history includes Arrhythmia in his father; Asthma in his brother and daughter; Bipolar disorder in his brother; COPD in his father; Fibromyalgia in his sister; Hypertension in his mother; Stroke (age of onset: 30) in his mother.He reports that he has been smoking cigarettes. He has a 16.5 pack-year smoking history. He quit smokeless tobacco use about 3 years ago.  His smokeless tobacco use included snuff and chew. He reports that he does not drink alcohol and does not use drugs.    ROS Review of Systems  Constitutional:  Negative for fever.  Respiratory:  Negative for shortness of breath.   Cardiovascular:  Negative for chest pain.  Gastrointestinal:  Positive for abdominal pain, anal bleeding, blood in stool and constipation.  Musculoskeletal:  Negative for arthralgias.  Skin:  Negative for rash.    Objective:  BP 133/85   Pulse 79   Temp (!) 97.2 F (36.2 C)   Ht 5\' 8"  (1.727 m)  Wt (!) 336 lb 3.2 oz (152.5 kg)   SpO2 96%   BMI 51.12 kg/m   BP Readings from Last 3 Encounters:  08/13/23 133/85  08/11/23 (!) 151/91  07/02/23 113/69    Wt Readings from Last 3 Encounters:  08/13/23 (!) 336 lb 3.2 oz (152.5 kg)  08/11/23 (!) 351 lb (159.2 kg)  07/02/23 (!) 351 lb (159.2 kg)     Physical Exam Vitals reviewed.  Constitutional:      Appearance: He is well-developed.  HENT:     Head: Normocephalic and atraumatic.     Right Ear: External ear normal.     Left Ear: External ear normal.     Mouth/Throat:     Pharynx: No oropharyngeal exudate or posterior oropharyngeal erythema.  Eyes:     Pupils: Pupils are equal, round, and reactive to light.  Cardiovascular:     Rate and Rhythm: Normal rate and regular rhythm.     Heart sounds: No murmur  heard. Pulmonary:     Effort: No respiratory distress.     Breath sounds: Normal breath sounds.  Abdominal:     General: There is distension.     Tenderness: There is abdominal tenderness (mild, diffuse today.). There is no guarding or rebound.  Musculoskeletal:     Cervical back: Normal range of motion and neck supple.  Neurological:     Mental Status: He is alert and oriented to person, place, and time.       Assessment & Plan:   Roy [name] was seen today for er follow up.  Diagnoses and all orders for this visit:  GAD (generalized anxiety disorder) -     ALPRAZolam (XANAX) 0.5 MG tablet; Take 1 tablet (0.5 mg total) by mouth 3 (three) times daily as needed. for anxiety  Other orders -     linaclotide (LINZESS) 290 MCG CAPS capsule; Take 1 capsule (290 mcg total) by mouth daily as needed (constipation).       I am having Roy [name]. Roy [name] maintain his acetaminophen, sildenafil, vitamin C, zinc gluconate, docusate sodium, albuterol, rosuvastatin, Vitamin D (Ergocalciferol), Blood Glucose Monitoring Suppl, fluticasone, ondansetron, fexofenadine-pseudoephedrine, metoprolol succinate, QUEtiapine, Accu-Chek Guide, DULoxetine, levothyroxine, Dexilant, traZODone, carvedilol, albuterol, tirzepatide, gemfibrozil, Symbicort, tamsulosin, ALPRAZolam, and linaclotide.  Allergies as of 08/13/2023       Reactions   Penicillins Rash, Other (See Comments)   Has patient had a PCN reaction causing immediate rash, facial/tongue/throat swelling, SOB or lightheadedness with hypotension: Yes Has patient had a PCN reaction causing severe rash involving mucus membranes or skin necrosis: No Has patient had a PCN reaction that required hospitalization: No Has patient had a PCN reaction occurring within the last 10 years: No If all of the above answers are "NO", then may proceed with Cephalosporin use. Has patient had a PCN reaction causing immediate rash, facial/tongue/throat swelling, SOB or  lightheadedness with hypotension: Yes Has patient had a PCN reaction causing severe rash involving mucus membranes or skin necrosis: No Has patient had a PCN reaction that required hospitalization: No Has patient had a PCN reaction occurring within the last 10 years: No If all of the above answers are "NO", then may proceed with Cephalosporin use. unknown   Chantix [varenicline] Other (See Comments)   Severe headache        Medication List        Accurate as of August 13, 2023  9:50 AM. If you have any questions, ask your nurse or doctor.  Accu-Chek Guide test strip Generic drug: glucose blood   acetaminophen 500 MG tablet Commonly known as: TYLENOL Take 1,000 mg by mouth every 6 (six) hours as needed for headache (pain).   albuterol 108 (90 Base) MCG/ACT inhaler Commonly known as: VENTOLIN HFA Inhale 1-2 puffs into the lungs every 6 (six) hours as needed for wheezing or shortness of breath.   albuterol (2.5 MG/3ML) 0.083% nebulizer solution Commonly known as: PROVENTIL Take 2.5 mg by nebulization every 6 (six) hours as needed.   ALPRAZolam 0.5 MG tablet Commonly known as: XANAX Take 1 tablet (0.5 mg total) by mouth 3 (three) times daily as needed. for anxiety   Blood Glucose Monitoring Suppl Devi 1 each by Does not apply route in the morning, at noon, and at bedtime. May substitute to any manufacturer covered by patient's insurance.   carvedilol 6.25 MG tablet Commonly known as: COREG Take 6.25 mg by mouth 2 (two) times daily.   Dexilant 60 MG capsule Generic drug: dexlansoprazole Take 1 capsule by mouth once daily   docusate sodium 100 MG capsule Commonly known as: COLACE Take 100 mg by mouth daily.   DULoxetine 60 MG capsule Commonly known as: CYMBALTA Take 2 capsules by mouth once daily   fexofenadine-pseudoephedrine 180-240 MG 24 hr tablet Commonly known as: ALLEGRA-D 24 Take 1 tablet by mouth every evening. For allergy and congestion    fluticasone 50 MCG/ACT nasal spray Commonly known as: FLONASE SPRAY 2 SPRAYS INTO EACH NOSTRIL EVERY DAY What changed: See the new instructions.   gemfibrozil 600 MG tablet Commonly known as: LOPID Take 1 tablet by mouth twice daily   levothyroxine 50 MCG tablet Commonly known as: SYNTHROID Take 1 tablet by mouth once daily   linaclotide 290 MCG Caps capsule Commonly known as: LINZESS Take 1 capsule (290 mcg total) by mouth daily as needed (constipation).   metoprolol succinate 50 MG 24 hr tablet Commonly known as: TOPROL-XL TAKE 1 TABLET BY MOUTH DAILY. FOR BLOOD PRESSURE CONTROL   ondansetron 4 MG tablet Commonly known as: Zofran Take 1 tablet (4 mg total) by mouth every 8 (eight) hours as needed for nausea or vomiting.   QUEtiapine 200 MG tablet Commonly known as: SEROQUEL TAKE 1 TABLET BY MOUTH AT BEDTIME FOR  NERVES   rosuvastatin 5 MG tablet Commonly known as: CRESTOR Take 1 tablet (5 mg total) by mouth daily. For cholesterol   sildenafil 20 MG tablet Commonly known as: REVATIO Take 2-5 tablets daily as needed for sex   Symbicort 160-4.5 MCG/ACT inhaler Generic drug: budesonide-formoterol INHALE 2 PUFFS BY MOUTH TWICE DAILY FOR ASTHMA   tamsulosin 0.4 MG Caps capsule Commonly known as: FLOMAX TAKE 1 CAPSULE BY MOUTH EVERY DAY   tirzepatide 10 MG/0.5ML Pen Commonly known as: MOUNJARO Inject 10 mg into the skin once a week.   traZODone 100 MG tablet Commonly known as: DESYREL TAKE 1 TABLET BY MOUTH EVERYDAY AT BEDTIME   vitamin C 1000 MG tablet Take 1,000 mg by mouth daily.   Vitamin D (Ergocalciferol) 1.25 MG (50000 UNIT) Caps capsule Commonly known as: DRISDOL Take 1 capsule (50,000 Units total) by mouth every 7 (seven) days.   zinc gluconate 50 MG tablet Take 50 mg by mouth daily.         Follow-up: No follow-ups on file.  Mechele Claude, M.D.

## 2023-08-14 ENCOUNTER — Telehealth: Payer: Self-pay

## 2023-08-14 NOTE — Patient Outreach (Signed)
Attempted to contact patient regarding care gaps. Left voicemail for patient to return my call at [phone].  Nicholes Rough, CMA Care Guide [employer]

## 2023-08-27 ENCOUNTER — Other Ambulatory Visit: Payer: Self-pay | Admitting: Family Medicine

## 2023-08-27 DIAGNOSIS — F411 Generalized anxiety disorder: Secondary | ICD-10-CM

## 2023-09-10 ENCOUNTER — Other Ambulatory Visit: Payer: Self-pay | Admitting: Family Medicine

## 2023-09-22 ENCOUNTER — Encounter: Payer: Self-pay | Admitting: Family Medicine

## 2023-09-25 ENCOUNTER — Other Ambulatory Visit: Payer: Self-pay | Admitting: Family Medicine

## 2023-09-25 MED ORDER — TIRZEPATIDE 10 MG/0.5ML ~~LOC~~ SOAJ: 10.0000 mg | SUBCUTANEOUS | 1 refills | Status: DC

## 2023-09-27 ENCOUNTER — Other Ambulatory Visit: Payer: Self-pay | Admitting: Family Medicine

## 2023-09-27 DIAGNOSIS — J329 Chronic sinusitis, unspecified: Secondary | ICD-10-CM

## 2023-10-06 ENCOUNTER — Other Ambulatory Visit: Payer: Self-pay | Admitting: *Deleted

## 2023-10-06 ENCOUNTER — Encounter: Payer: Self-pay | Admitting: Family Medicine

## 2023-10-06 ENCOUNTER — Ambulatory Visit (INDEPENDENT_AMBULATORY_CARE_PROVIDER_SITE_OTHER): Payer: Medicare HMO | Admitting: Family Medicine

## 2023-10-06 VITALS — BP 127/75 | HR 72 | Temp 98.1°F | Ht 68.0 in | Wt 333.2 lb

## 2023-10-06 DIAGNOSIS — M5416 Radiculopathy, lumbar region: Secondary | ICD-10-CM

## 2023-10-06 DIAGNOSIS — I10 Essential (primary) hypertension: Secondary | ICD-10-CM

## 2023-10-06 DIAGNOSIS — E039 Hypothyroidism, unspecified: Secondary | ICD-10-CM

## 2023-10-06 DIAGNOSIS — E119 Type 2 diabetes mellitus without complications: Secondary | ICD-10-CM | POA: Diagnosis not present

## 2023-10-06 DIAGNOSIS — F411 Generalized anxiety disorder: Secondary | ICD-10-CM

## 2023-10-06 LAB — BAYER DCA HB A1C WAIVED: HB A1C (BAYER DCA - WAIVED): 5.6 % (ref 4.8–5.6)

## 2023-10-06 LAB — LIPID PANEL

## 2023-10-06 MED ORDER — LEVOTHYROXINE SODIUM 50 MCG PO TABS: 50.0000 ug | ORAL_TABLET | Freq: Every day | ORAL | 3 refills | Status: DC

## 2023-10-06 MED ORDER — GEMFIBROZIL 600 MG PO TABS: 600.0000 mg | ORAL_TABLET | Freq: Two times a day (BID) | ORAL | 0 refills | Status: DC

## 2023-10-06 MED ORDER — DULOXETINE HCL 60 MG PO CPEP: 120.0000 mg | ORAL_CAPSULE | Freq: Every day | ORAL | 3 refills | Status: AC

## 2023-10-06 MED ORDER — METOPROLOL SUCCINATE ER 50 MG PO TB24: 50.0000 mg | ORAL_TABLET | Freq: Every day | ORAL | 3 refills | Status: AC

## 2023-10-06 MED ORDER — ROSUVASTATIN CALCIUM 5 MG PO TABS: 5.0000 mg | ORAL_TABLET | Freq: Every day | ORAL | 3 refills | Status: DC

## 2023-10-06 MED ORDER — QUETIAPINE FUMARATE 200 MG PO TABS: ORAL_TABLET | ORAL | 3 refills | Status: DC

## 2023-10-06 MED ORDER — MUPIROCIN 2 % EX OINT: TOPICAL_OINTMENT | CUTANEOUS | 1 refills | Status: AC

## 2023-10-06 NOTE — Progress Notes (Signed)
 Subjective:  Patient ID: Roy [name], male    DOB: [birth year]  Age: 53 y.o. MRN: [id number]  CC: Medical Management of Chronic Issues  . HPI Roy [name] presents forFollow-up of diabetes. Patient checks blood sugar at home.  Log reviewed and attached.  Shows good control of his glucose. Patient denies symptoms such as polyuria, polydipsia, excessive hunger, nausea No significant hypoglycemic spells noted. Medications reviewed. Pt reports taking them regularly without complication/adverse reaction being reported today.    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   He has a past surgical history that includes Rotator cuff repair (Right); Biceps tendon repair (Right); Neck surgery; and Knee arthroscopy with lateral menisectomy (Left, 10/09/2021).   His family history includes Arrhythmia in his father; Asthma in his brother and daughter; Bipolar disorder in his brother; COPD in his father; Fibromyalgia in his sister; Hypertension in his mother; Stroke (age of onset: 59) in his mother.He reports that he has been smoking cigarettes. He has a 16.5 pack-year smoking history. He quit smokeless tobacco use about 4 years ago.  His smokeless tobacco use included snuff and chew. He reports that he does not drink alcohol and does not use drugs.  Current Outpatient Medications on File Prior to Visit  Medication Sig Dispense Refill   ACCU-CHEK GUIDE test strip      acetaminophen  (TYLENOL ) 500 MG tablet Take 1,000 mg by mouth every 6 (six) hours as needed for headache (pain).      albuterol  (PROVENTIL ) (2.5 MG/3ML) 0.083% nebulizer solution Take 2.5 mg by nebulization every 6 (six) hours as needed.     albuterol   (VENTOLIN  HFA) 108 (90 Base) MCG/ACT inhaler INHALE 1-2 PUFFS BY MOUTH EVERY 6 HOURS AS NEEDED FOR WHEEZE OR SHORTNESS OF BREATH 18 each 0   ALPRAZolam  (XANAX ) 0.5 MG tablet Take 1 tablet (0.5 mg total) by mouth 3 (three) times daily as needed. for anxiety 90 tablet 5   Ascorbic Acid (VITAMIN C) 1000 MG tablet Take 1,000 mg by mouth daily.     Blood Glucose Monitoring Suppl DEVI 1 each by Does not apply route in the morning, at noon, and at bedtime. May substitute to any manufacturer covered by patient's insurance. 1 each 0   carvedilol  (COREG ) 6.25 MG tablet Take 6.25 mg by mouth 2 (two) times daily.     DEXILANT  60 MG capsule Take 1 capsule by mouth once daily 90 capsule 0   docusate sodium (COLACE) 100 MG capsule Take 100 mg by mouth daily.     fexofenadine -pseudoephedrine (ALLEGRA-D 24) 180-240 MG 24 hr tablet Take 1 tablet by mouth every evening. For allergy and congestion 30 tablet 11   fluticasone  (FLONASE ) 50 MCG/ACT nasal spray SPRAY 2 SPRAYS INTO EACH NOSTRIL EVERY DAY (Patient taking differently: Place 2 sprays into both nostrils daily.) 48 mL 1   linaclotide  (LINZESS ) 290 MCG CAPS capsule Take 1 capsule (290 mcg total) by mouth daily as needed (constipation). 90 capsule 3   ondansetron  (ZOFRAN ) 4 MG tablet Take 1 tablet (4 mg total) by mouth every 8 (eight) hours as needed for nausea or vomiting. 10 tablet 0   sildenafil  (REVATIO ) 20 MG tablet Take 2-5 tablets daily as needed for sex 180 tablet 1   SYMBICORT  160-4.5 MCG/ACT inhaler INHALE 2 PUFFS BY  MOUTH TWICE DAILY FOR ASTHMA 10.2 each 2   tamsulosin  (FLOMAX ) 0.4 MG CAPS capsule TAKE 1 CAPSULE BY MOUTH EVERY DAY 90 capsule 0   tirzepatide  (MOUNJARO ) 10 MG/0.5ML Pen Inject 10 mg into the skin once a week. 6 mL 1   traZODone  (DESYREL ) 100 MG tablet TAKE 1 TABLET BY MOUTH EVERYDAY AT BEDTIME 90 tablet 3   Vitamin D , Ergocalciferol , (DRISDOL ) 1.25 MG (50000 UNIT) CAPS capsule TAKE 1 CAPSULE (50,000 UNITS TOTAL) BY MOUTH EVERY 7 (SEVEN) DAYS  13 capsule 3   zinc gluconate 50 MG tablet Take 50 mg by mouth daily.     [DISCONTINUED] famotidine  (PEPCID ) 20 MG tablet Take 1 tablet (20 mg total) by mouth 2 (two) times daily for 14 days. 28 tablet 0   No current facility-administered medications on file prior to visit.    ROS Review of Systems  Constitutional:  Negative for fever.  HENT:  Positive for congestion, postnasal drip and rhinorrhea. Negative for sinus pressure and sinus pain.   Respiratory:  Negative for cough and shortness of breath.   Cardiovascular:  Negative for chest pain.  Musculoskeletal:  Negative for arthralgias.  Skin:  Negative for rash.    Objective:  BP 127/75   Pulse 72   Temp 98.1 F (36.7 C)   Ht 5' 8 (1.727 m)   Wt (!) 333 lb 3.2 oz (151.1 kg)   SpO2 96%   BMI 50.66 kg/m   BP Readings from Last 3 Encounters:  10/06/23 127/75  08/13/23 133/85  08/11/23 (!) 151/91    Wt Readings from Last 3 Encounters:  10/06/23 (!) 333 lb 3.2 oz (151.1 kg)  08/13/23 (!) 336 lb 3.2 oz (152.5 kg)  08/11/23 (!) 351 lb (159.2 kg)     Physical Exam Vitals reviewed.  Constitutional:      General: He is not in acute distress.    Appearance: He is well-developed. He is obese.  HENT:     Head: Normocephalic and atraumatic.     Right Ear: External ear normal.     Left Ear: External ear normal.     Mouth/Throat:     Pharynx: No oropharyngeal exudate or posterior oropharyngeal erythema.  Eyes:     Pupils: Pupils are equal, round, and reactive to light.  Cardiovascular:     Rate and Rhythm: Normal rate and regular rhythm.     Heart sounds: No murmur heard. Pulmonary:     Effort: No respiratory distress.     Breath sounds: Normal breath sounds.  Musculoskeletal:     Cervical back: Normal range of motion and neck supple.  Neurological:     Mental Status: He is alert and oriented to person, place, and time.       Assessment & Plan:   Roy was seen today for medical management of chronic  issues.  Diagnoses and all orders for this visit:  Benign essential HTN -     CMP14+EGFR -     CBC with Differential/Platelet  Lumbar radiculopathy -     DULoxetine  (CYMBALTA ) 60 MG capsule; Take 2 capsules (120 mg total) by mouth daily.  GAD (generalized anxiety disorder) -     DULoxetine  (CYMBALTA ) 60 MG capsule; Take 2 capsules (120 mg total) by mouth daily. -     QUEtiapine  (SEROQUEL ) 200 MG tablet; TAKE 1 TABLET BY MOUTH AT BEDTIME FOR  NERVES  Hypothyroidism, unspecified type -     levothyroxine  (SYNTHROID ) 50 MCG tablet; Take 1 tablet (50 mcg total)  by mouth daily. -     TSH + free T4  New onset type 2 diabetes mellitus (HCC) -     Lipid panel -     CMP14+EGFR -     CBC with Differential/Platelet -     Bayer DCA Hb A1c Waived  Other orders -     gemfibrozil  (LOPID ) 600 MG tablet; Take 1 tablet (600 mg total) by mouth 2 (two) times daily. -     metoprolol  succinate (TOPROL -XL) 50 MG 24 hr tablet; Take 1 tablet (50 mg total) by mouth daily. Take with or immediately following a meal. -     rosuvastatin  (CRESTOR ) 5 MG tablet; Take 1 tablet (5 mg total) by mouth daily. For cholesterol -     mupirocin  ointment (BACTROBAN ) 2 %; Apply and cover with bandage twice daily      I have changed Roy [name]'s DULoxetine , gemfibrozil , levothyroxine , and metoprolol  succinate. I am also having him start on mupirocin  ointment. Additionally, I am having him maintain his acetaminophen , sildenafil , vitamin C, zinc gluconate, docusate sodium, Blood Glucose Monitoring Suppl, fluticasone , ondansetron , fexofenadine -pseudoephedrine, Accu-Chek Guide, traZODone , carvedilol , albuterol , Symbicort , tamsulosin , ALPRAZolam , linaclotide , Dexilant , tirzepatide , albuterol , Vitamin D  (Ergocalciferol ), QUEtiapine , and rosuvastatin .  Meds ordered this encounter  Medications   DULoxetine  (CYMBALTA ) 60 MG capsule    Sig: Take 2 capsules (120 mg total) by mouth daily.    Dispense:  180 capsule    Refill:   3   gemfibrozil  (LOPID ) 600 MG tablet    Sig: Take 1 tablet (600 mg total) by mouth 2 (two) times daily.    Dispense:  180 tablet    Refill:  0   levothyroxine  (SYNTHROID ) 50 MCG tablet    Sig: Take 1 tablet (50 mcg total) by mouth daily.    Dispense:  90 tablet    Refill:  3   metoprolol  succinate (TOPROL -XL) 50 MG 24 hr tablet    Sig: Take 1 tablet (50 mg total) by mouth daily. Take with or immediately following a meal.    Dispense:  90 tablet    Refill:  3   QUEtiapine  (SEROQUEL ) 200 MG tablet    Sig: TAKE 1 TABLET BY MOUTH AT BEDTIME FOR  NERVES    Dispense:  90 tablet    Refill:  3   rosuvastatin  (CRESTOR ) 5 MG tablet    Sig: Take 1 tablet (5 mg total) by mouth daily. For cholesterol    Dispense:  90 tablet    Refill:  3   mupirocin  ointment (BACTROBAN ) 2 %    Sig: Apply and cover with bandage twice daily    Dispense:  30 g    Refill:  1    For the upper respiratory symptoms I recommended a combination of DayQuil and NyQuil Follow-up: Return in about 3 months (around 01/04/2024).  Butler Der, M.D.

## 2023-10-07 LAB — LIPID PANEL
Cholesterol, Total: 118 mg/dL (ref 100–199)
HDL: 42 mg/dL (ref >39)
LDL CALC COMMENT:: 2.8 ratio (ref 0.0–5.0)
LDL Chol Calc (NIH): 49 mg/dL (ref 0–99)
Triglycerides: 163 mg/dL — ABNORMAL HIGH (ref 0–149)
VLDL Cholesterol Cal: 27 mg/dL (ref 5–40)

## 2023-10-07 LAB — CBC WITH DIFFERENTIAL/PLATELET
Basophils Absolute: 0 10*3/uL (ref 0.0–0.2)
Basos: 1 %
EOS (ABSOLUTE): 0.1 10*3/uL (ref 0.0–0.4)
Eos: 2 %
Hematocrit: 41.5 % (ref 37.5–51.0)
Hemoglobin: 14.2 g/dL (ref 13.0–17.7)
Immature Grans (Abs): 0 10*3/uL (ref 0.0–0.1)
Immature Granulocytes: 0 %
Lymphocytes Absolute: 2.1 10*3/uL (ref 0.7–3.1)
Lymphs: 36 %
MCH: 31.8 pg (ref 26.6–33.0)
MCHC: 34.2 g/dL (ref 31.5–35.7)
MCV: 93 fL (ref 79–97)
Monocytes Absolute: 0.3 10*3/uL (ref 0.1–0.9)
Monocytes: 5 %
Neutrophils Absolute: 3.2 10*3/uL (ref 1.4–7.0)
Neutrophils: 56 %
Platelets: 221 10*3/uL (ref 150–450)
RBC: 4.46 x10E6/uL (ref 4.14–5.80)
RDW: 12.8 % (ref 11.6–15.4)
WBC: 5.7 10*3/uL (ref 3.4–10.8)

## 2023-10-07 LAB — CMP14+EGFR
ALT: 13 IU/L (ref 0–44)
AST: 15 IU/L (ref 0–40)
Albumin: 4.4 g/dL (ref 4.1–5.1)
Alkaline Phosphatase: 79 IU/L (ref 44–121)
BUN/Creatinine Ratio: 11 (ref 9–20)
BUN: 9 mg/dL (ref 6–24)
Bilirubin Total: 0.2 mg/dL (ref 0.0–1.2)
CO2: 21 mmol/L (ref 20–29)
Calcium: 9.3 mg/dL (ref 8.7–10.2)
Chloride: 99 mmol/L (ref 96–106)
Creatinine, Ser: 0.81 mg/dL (ref 0.76–1.27)
Globulin, Total: 2.3 g/dL (ref 1.5–4.5)
Glucose: 108 mg/dL — ABNORMAL HIGH (ref 70–99)
Potassium: 4.4 mmol/L (ref 3.5–5.2)
Sodium: 136 mmol/L (ref 134–144)
Total Protein: 6.7 g/dL (ref 6.0–8.5)
eGFR: 107 mL/min/{1.73_m2} (ref >59)

## 2023-10-07 LAB — TSH+FREE T4
Free T4: 0.83 ng/dL (ref 0.82–1.77)
TSH: 3.33 u[IU]/mL (ref 0.450–4.500)

## 2023-10-08 NOTE — Progress Notes (Signed)
Hello [name],  Your lab result is normal and/or stable.Some minor variations that are not significant are commonly marked abnormal, but do not represent any medical problem for you.  Best regards, Tove Wideman, M.D.

## 2023-10-16 ENCOUNTER — Other Ambulatory Visit: Payer: Self-pay | Admitting: Family Medicine

## 2023-10-17 ENCOUNTER — Other Ambulatory Visit: Payer: Self-pay | Admitting: Family Medicine

## 2023-10-17 DIAGNOSIS — I10 Essential (primary) hypertension: Secondary | ICD-10-CM

## 2023-10-24 ENCOUNTER — Other Ambulatory Visit: Payer: Self-pay | Admitting: Family Medicine

## 2023-10-24 DIAGNOSIS — J329 Chronic sinusitis, unspecified: Secondary | ICD-10-CM

## 2023-10-26 ENCOUNTER — Encounter: Payer: Self-pay | Admitting: Family Medicine

## 2023-10-31 ENCOUNTER — Encounter: Payer: Self-pay | Admitting: Family

## 2023-10-31 ENCOUNTER — Ambulatory Visit (INDEPENDENT_AMBULATORY_CARE_PROVIDER_SITE_OTHER): Payer: Medicare HMO

## 2023-10-31 ENCOUNTER — Ambulatory Visit: Payer: Self-pay | Admitting: Family Medicine

## 2023-10-31 ENCOUNTER — Ambulatory Visit (INDEPENDENT_AMBULATORY_CARE_PROVIDER_SITE_OTHER): Payer: Medicare HMO | Admitting: Family

## 2023-10-31 VITALS — BP 108/67 | HR 62 | Temp 98.8°F | Ht 68.0 in | Wt 329.0 lb

## 2023-10-31 DIAGNOSIS — J208 Acute bronchitis due to other specified organisms: Secondary | ICD-10-CM | POA: Diagnosis not present

## 2023-10-31 DIAGNOSIS — R0789 Other chest pain: Secondary | ICD-10-CM | POA: Diagnosis not present

## 2023-10-31 DIAGNOSIS — R0602 Shortness of breath: Secondary | ICD-10-CM

## 2023-10-31 DIAGNOSIS — J4521 Mild intermittent asthma with (acute) exacerbation: Secondary | ICD-10-CM | POA: Diagnosis not present

## 2023-10-31 DIAGNOSIS — R0989 Other specified symptoms and signs involving the circulatory and respiratory systems: Secondary | ICD-10-CM

## 2023-10-31 DIAGNOSIS — B9689 Other specified bacterial agents as the cause of diseases classified elsewhere: Secondary | ICD-10-CM | POA: Diagnosis not present

## 2023-10-31 MED ORDER — PREDNISONE 10 MG (21) PO TBPK: ORAL_TABLET | ORAL | 0 refills | Status: DC

## 2023-10-31 MED ORDER — ALBUTEROL SULFATE (2.5 MG/3ML) 0.083% IN NEBU
2.5000 mg | INHALATION_SOLUTION | Freq: Four times a day (QID) | RESPIRATORY_TRACT | 1 refills | Status: DC | PRN
Start: 2023-10-31 — End: 2023-12-03

## 2023-10-31 MED ORDER — DOXYCYCLINE HYCLATE 100 MG PO TABS: 100.0000 mg | ORAL_TABLET | Freq: Two times a day (BID) | ORAL | 0 refills | Status: DC

## 2023-10-31 NOTE — Progress Notes (Signed)
 Subjective:    Patient ID: Roy [name], male    DOB: 1971/04/05, 53 y.o.   MRN: [id number]  Chief Complaint  Patient presents with   Nasal Congestion   Shortness of Breath   Chest Pain   Pt presents to the office today with SOB, chest pain, and congestion that started 5 days ago and has worsen.  Shortness of Breath This is a new problem. The current episode started in the past 7 days. The problem occurs constantly. The problem has been gradually worsening. Associated symptoms include chest pain, a fever, rhinorrhea and sputum production. Pertinent negatives include no headaches or sore throat. He has tried OTC cough suppressants for the symptoms. The treatment provided mild relief. His past medical history is significant for asthma.  Chest Pain  Associated symptoms include a fever, shortness of breath and sputum production. Pertinent negatives include no headaches.      Review of Systems  Constitutional:  Positive for fever.  HENT:  Positive for rhinorrhea. Negative for sore throat.   Respiratory:  Positive for sputum production and shortness of breath.   Cardiovascular:  Positive for chest pain.  Neurological:  Negative for headaches.  All other systems reviewed and are negative.   Social History   Socioeconomic History   Marital status: Divorced    Spouse name: Not on file   Number of children: 1   Years of education: GED   Highest education level: GED or equivalent  Occupational History   Occupation: Disability    Comment: Worked as advice worker  Tobacco Use   Smoking status: Every Day    Current packs/day: 0.50    Average packs/day: 0.5 packs/day for 33.0 years (16.5 ttl pk-yrs)    Types: Cigarettes   Smokeless tobacco: Former    Types: Snuff, Chew    Quit date: 10/05/2019  Vaping Use   Vaping status: Never Used  Substance and Sexual Activity   Alcohol use: No   Drug use: No   Sexual activity: Yes  Other Topics Concern   Not on file  Social  History Narrative   Lives with daughter   Social Drivers of Health   Financial Resource Strain: Low Risk  (12/17/2022)   Overall Financial Resource Strain (CARDIA)    Difficulty of Paying Living Expenses: Not hard at all  Food Insecurity: No Food Insecurity (12/17/2022)   Hunger Vital Sign    Worried About Running Out of Food in the Last Year: Never true    Ran Out of Food in the Last Year: Never true  Transportation Needs: No Transportation Needs (12/17/2022)   PRAPARE - Administrator, Civil Service (Medical): No    Lack of Transportation (Non-Medical): No  Physical Activity: Inactive (12/17/2022)   Exercise Vital Sign    Days of Exercise per Week: 0 days    Minutes of Exercise per Session: 0 min  Stress: No Stress Concern Present (12/17/2022)   [employer] of Occupational Health - Occupational Stress Questionnaire    Feeling of Stress : Not at all  Social Connections: Moderately Integrated (12/17/2022)   Social Connection and Isolation Panel [NHANES]    Frequency of Communication with Friends and Family: More than three times a week    Frequency of Social Gatherings with Friends and Family: More than three times a week    Attends Religious Services: More than 4 times per year    Active Member of [employer] or Organizations: Yes    Attends Club or  Organization Meetings: More than 4 times per year    Marital Status: Divorced   Family History  Problem Relation Age of Onset   Hypertension Mother    Stroke Mother 3   Arrhythmia Father    COPD Father    Fibromyalgia Sister    Asthma Brother    Bipolar disorder Brother    Asthma Daughter         Objective:   Physical Exam Vitals reviewed.  Constitutional:      General: He is not in acute distress.    Appearance: He is well-developed. He is obese.  HENT:     Head: Normocephalic.     Right Ear: Tympanic membrane and external ear normal.     Left Ear: Tympanic membrane and external ear normal.  Eyes:      General:        Right eye: No discharge.        Left eye: No discharge.     Pupils: Pupils are equal, round, and reactive to light.  Neck:     Thyroid : No thyromegaly.  Cardiovascular:     Rate and Rhythm: Normal rate and regular rhythm.     Heart sounds: Normal heart sounds. No murmur heard. Pulmonary:     Effort: Pulmonary effort is normal. No respiratory distress.     Breath sounds: Wheezing present.     Comments: Dry nonproductive cough Abdominal:     General: Bowel sounds are normal. There is no distension.     Palpations: Abdomen is soft.     Tenderness: There is no abdominal tenderness.  Musculoskeletal:        General: No tenderness. Normal range of motion.     Cervical back: Normal range of motion and neck supple.  Skin:    General: Skin is warm and dry.     Findings: No erythema or rash.  Neurological:     Mental Status: He is alert and oriented to person, place, and time.     Cranial Nerves: No cranial nerve deficit.     Deep Tendon Reflexes: Reflexes are normal and symmetric.  Psychiatric:        Behavior: Behavior normal.        Thought Content: Thought content normal.        Judgment: Judgment normal.       BP 108/67   Pulse 62   Temp 98.8 F (37.1 C)   Ht 5' 8 (1.727 m)   Wt (!) 329 lb (149.2 kg)   SpO2 94%   BMI 50.02 kg/m      Assessment & Plan:  Roy [name] comes in today with chief complaint of Nasal Congestion, Shortness of Breath, and Chest Pain   Diagnosis and orders addressed:  1. Chest congestion (Primary) - DG Chest 2 View  2. Shortness of breath - DG Chest 2 View  3. Chest tightness  - DG Chest 2 View  4. Mild intermittent asthma with acute exacerbation - albuterol  (PROVENTIL ) (2.5 MG/3ML) 0.083% nebulizer solution; Take 3 mLs (2.5 mg total) by nebulization every 6 (six) hours as needed.  Dispense: 75 mL; Refill: 1  5. Acute bacterial bronchitis - Take meds as prescribed - Use a cool mist humidifier  -Use saline nose  sprays frequently -Force fluids -For any cough or congestion  Use plain Mucinex - regular strength or max strength is fine -For fever or aces or pains- take tylenol  or ibuprofen . -Throat lozenges if help -Follow up if symptoms worsen or  do not improve  - predniSONE  (STERAPRED UNI-PAK 21 TAB) 10 MG (21) TBPK tablet; Use as directed  Dispense: 21 tablet; Refill: 0 - albuterol  (PROVENTIL ) (2.5 MG/3ML) 0.083% nebulizer solution; Take 3 mLs (2.5 mg total) by nebulization every 6 (six) hours as needed.  Dispense: 75 mL; Refill: 1 - doxycycline  (VIBRA -TABS) 100 MG tablet; Take 1 tablet (100 mg total) by mouth 2 (two) times daily.  Dispense: 20 tablet; Refill: 0     Bari Learn, FNP

## 2023-10-31 NOTE — Telephone Encounter (Signed)
 Copied from CRM [id number]. Topic: Clinical - Red Word Triage >> Oct 31, 2023  8:02 AM Roy [name] wrote: Red Word that prompted transfer to Nurse Triage: patient is having chest discomfort and shorten of breathe. Patient has been having these symptoms for 4-5 days.  Chief Complaint: cold like symptoms with chest discomfort Symptoms: congestion, headache, chest discomfort, difficulty breathing at times Frequency: started 4-5 days ago. Pertinent Negatives: Patient denies fever, cough, pain that radiates or lasts longer than 5 minutes.  Disposition: [] ED /[] Urgent Care (no appt availability in office) / [x] Appointment(In office/virtual)/ []  [facility]/ [] Home Care/ [] Refused Recommended Disposition /[]  Mobile Bus/ []  Follow-up with PCP Additional Notes: Patient able to speak in complete sentences, no wheezing heard.  States chest discomfort comes and goes and this am it is a 3-4/10.  Apt set for today.  Care advice given, denies questions, instructed to go to the er if becomes worse.   Reason for Disposition  [1] Chest pain lasting < 5 minutes AND [2] has not taken prescribed nitroglycerin   Answer Assessment - Initial Assessment Questions 1. LOCATION: Where does it hurt?       Chest pain and sob, coughing up whitish yellow 2. RADIATION: Does the pain go anywhere else? (e.g., into neck, jaw, arms, back)     4-5 days ago.Denies, states chest pain comes and goes. 3. ONSET: When did the chest pain begin? (Minutes, hours or days)      4-5 days ago 4. PATTERN: Does the pain come and go, or has it been constant since it started?  Does it get worse with exertion?      Comes and goes 5. DURATION: How long does it last (e.g., seconds, minutes, hours)     Varies, off and on 6. SEVERITY: How bad is the pain?  (e.g., Scale 1-10; mild, moderate, or severe)    - MILD (1-3): doesn't interfere with normal activities     - MODERATE (4-7): interferes with normal activities  or awakens from sleep    - SEVERE (8-10): excruciating pain, unable to do any normal activities       3/10 7. CARDIAC RISK FACTORS: Do you have any history of heart problems or risk factors for heart disease? (e.g., angina, prior heart attack; diabetes, high blood pressure, high cholesterol, smoker, or strong family history of heart disease)     Cholesterol, smoker, htn 8. PULMONARY RISK FACTORS: Do you have any history of lung disease?  (e.g., blood clots in lung, asthma, emphysema, birth control pills)     Asthma, copd 9. CAUSE: What do you think is causing the chest pain?     cold 10. OTHER SYMPTOMS: Do you have any other symptoms? (e.g., dizziness, nausea, vomiting, sweating, fever, difficulty breathing, cough)       Difficulty breathing, sweating 11. PREGNANCY: Is there any chance you are pregnant? When was your last menstrual period?       na  Protocols used: Chest Pain-A-AH

## 2023-10-31 NOTE — Telephone Encounter (Signed)
Pt has an appt in office today.

## 2023-10-31 NOTE — Patient Instructions (Signed)

## 2023-11-05 ENCOUNTER — Other Ambulatory Visit: Payer: Self-pay | Admitting: Family Medicine

## 2023-11-05 DIAGNOSIS — R399 Unspecified symptoms and signs involving the genitourinary system: Secondary | ICD-10-CM

## 2023-11-05 DIAGNOSIS — J418 Mixed simple and mucopurulent chronic bronchitis: Secondary | ICD-10-CM

## 2023-11-05 NOTE — Telephone Encounter (Signed)
I think he should be seen.

## 2023-11-06 ENCOUNTER — Encounter: Payer: Self-pay | Admitting: Family Medicine

## 2023-11-06 ENCOUNTER — Ambulatory Visit: Payer: Medicare HMO | Admitting: Family Medicine

## 2023-11-06 VITALS — BP 127/76 | HR 76 | Temp 98.1°F | Ht 68.0 in | Wt 325.0 lb

## 2023-11-06 DIAGNOSIS — J4521 Mild intermittent asthma with (acute) exacerbation: Secondary | ICD-10-CM

## 2023-11-06 DIAGNOSIS — J069 Acute upper respiratory infection, unspecified: Secondary | ICD-10-CM

## 2023-11-06 MED ORDER — METHYLPREDNISOLONE ACETATE 80 MG/ML IJ SUSP
80.0000 mg | Freq: Once | INTRAMUSCULAR | Status: AC
  Administered 2023-11-06: 80 mg via INTRAMUSCULAR

## 2023-11-06 NOTE — Patient Instructions (Signed)
Asthma Attack  Asthma attack, also called asthma flare or acute bronchospasm, is the sudden narrowing and tightening of the lower airways (bronchi) in the lungs, that can make it hard to breathe. The narrowing is caused by inflammation and tightening of the smooth muscle that wraps around the lower airways in the lungs. Asthma attacks may cause coughing, high-pitched whistling sounds when you breathe, most often when you breathe out (wheezing), trouble breathing (shortness of breath), and chest pain. The airways may produce extra mucus caused by the inflammation and irritation. During an asthma attack, it can be difficult to breathe. It is important to get treatment right away. Asthma attacks can range from minor to life-threatening. What are the causes? Possible causes or triggers of this condition include: Household allergens like dust, pet dander, and cockroaches. Mold and pollen from trees or grass. Air pollutants such as household cleaners, aerosol sprays, strong odors, and smoke of any kind. Weather changes and cold air. Stress or strong emotions such as crying or laughing hard. Exercise or activity that requires a lot of energy. Certain medicines or medical conditions such as: Aspirin or beta-blockers. Infections or inflammatory conditions, such as a flu (influenza), a cold, pneumonia, or inflammation of the nasal membranes (rhinitis). Gastroesophageal reflux disease (GERD). GERD is a condition in which stomach acid backs up into your esophagus and spills into your trachea (windpipe), which can irritate your airways. What are the signs or symptoms? Symptoms of this condition include: Wheezing. Excessive coughing. This may only happen at night. Chest tightness or pain. Shortness of breath. Difficulty talking in complete sentences. Feeling like you cannot get enough air, no matter how hard you breathe (air hunger). How is this diagnosed? This condition may be diagnosed based on: A  physical exam and your medical history. Your symptoms. Tests to check for other causes of your symptoms or other conditions that may have triggered your asthma attack. These tests may include: A chest X-ray. Blood tests. Lung function studies (spirometry) to evaluate the flow of air in your lungs. How is this treated? Treatment for this condition depends on the severity and cause of your asthma attack. For mild attacks, you may receive medicines through a hand-held inhaler (metered dose inhaler or MDI) or through a device that turns liquid medicine into a mist (nebulizer). These medicines include: Quick relief or rescue medicines that quickly relax the airways and lungs. Long-acting medicines that are used daily to prevent (control) your asthma symptoms. For moderate or severe attacks, you may be treated with steroid medicines by mouth or through an IV injection at the hospital. For severe attacks, you may need oxygen therapy or a breathing machine (ventilator). If your asthma attack was caused by an infection from bacteria, you will be given antibiotic medicines. Follow these instructions at home: Medicines Take over-the-counter and prescription medicines only as told by your health care provider. If you were prescribed an antibiotic medicine, take it as told by your health care provider. Do not stop using the antibiotic even if you start to feel better. Tell your doctor if you are pregnant or may be pregnant to make sure your asthma medicine is safe to use during pregnancy. Avoiding triggers  Keep track of things that trigger your asthma attacks. Avoid exposure to these triggers. Do not use any products that contain nicotine or tobacco. These products include cigarettes, chewing tobacco, and vaping devices, such as e-cigarettes. If you need help quitting, ask your health care provider. When there is a lot  of pollen, air pollution, or humidity, keep windows closed and use an air conditioner  or go to places with air conditioning. Asthma action plan Work with your health care provider to make a written plan for managing and treating your asthma attacks (asthma action plan). This plan should include: A list of your asthma triggers and how to avoid them. A list of symptoms that you may have during an asthma attack. Information about which medicine to take, when to take the medicine, and how much of the medicine to take. Information to help you understand your peak flow measurements. Daily actions that you can take to control your asthma symptoms. Contact information for your health care providers. If you have an asthma attack, act quickly. Follow the emergency steps on your written asthma action plan. This may prevent you from needing to go to the hospital. Talk to a family member or close friend about your asthma action plan and who to contact in case you need help. General instructions Avoid excessive exercise or activity until your asthma attack goes away. Stay up to date on all your vaccines, such as flu and pneumonia vaccines. Keep all follow-up visits. This is important. Contact a health care provider if: You have followed your action plan for 1 hour and your peak flow reading is still at 50-79% of your personal best. This is in the yellow zone, which means "caution." You need to use your quick reliever medicine more frequently than normal. Your medicines are causing side effects, such as rash, itching, swelling, or trouble breathing. Your symptoms do not improve after taking medicine. You have a fever. Get help right away if: Your peak flow reading is less than 50% of your personal best. This is in the red zone, which means "danger." You develop chest pain or discomfort. Your medicines no longer seem to be helping. You are coughing up bloody mucus. You have a fever and your symptoms suddenly get worse. You have trouble swallowing. You feel very tired, and breathing  becomes tiring. These symptoms may be an emergency. Get help right away. Call 911. Do not wait to see if the symptoms will go away. Do not drive yourself to the hospital. Summary Asthma attacks are caused by narrowing or tightness in air passages, which causes shortness of breath, coughing, and wheezing. Many things can trigger an asthma attack, such as allergens, weather changes, exercise, strong odors, and smoke of any kind. If you have an asthma attack, act quickly. Follow the emergency steps on your written asthma action plan. Get help right away if you have severe trouble breathing, chest pain, or fever, or if your home medicines are no longer helping with your symptoms. This information is not intended to replace advice given to you by your health care provider. Make sure you discuss any questions you have with your health care provider. Document Revised: 07/01/2021 Document Reviewed: 07/01/2021 Elsevier Patient Education  2024 [employer].

## 2023-11-06 NOTE — Progress Notes (Signed)
Acute Office Visit  Subjective:     Patient ID: Roy [name], male    DOB: Feb 15, 1971, 53 y.o.   MRN: [id number]  Chief Complaint  Patient presents with   Nasal Congestion    URI  This is a new problem. Episode onset: 7-10 days. The problem has been gradually improving. Associated symptoms include congestion, headaches, rhinorrhea and wheezing. Pertinent negatives include no abdominal pain, coughing, ear pain, nausea, sinus pain, sneezing or vomiting. Associated symptoms comments: Shortness of breath with tightness intermittently. Treatments tried: symbicort, doxycyline, prednisone. The treatment provided moderate relief.   Finished prednisone yesterday. Increased shortness of breath, chest tightness since completing prednisone. Still on doxycyline. Compliant with symbicort. Hasn't been using albuterol inhaler or nebulizer. Had negative CXR on 10/31/23.  Review of Systems  HENT:  Positive for congestion and rhinorrhea. Negative for ear pain, sinus pain and sneezing.   Respiratory:  Positive for wheezing. Negative for cough.   Gastrointestinal:  Negative for abdominal pain, nausea and vomiting.  Neurological:  Positive for headaches.        Objective:    BP 127/76   Pulse 76   Temp 98.1 F (36.7 C) (Temporal)   Ht 5\' 8"  (1.727 m)   Wt (!) 325 lb (147.4 kg)   SpO2 97%   BMI 49.42 kg/m    Physical Exam Vitals and nursing note reviewed.  Constitutional:      General: He is not in acute distress.    Appearance: Normal appearance. He is obese. He is not ill-appearing, toxic-appearing or diaphoretic.  HENT:     Head: Normocephalic and atraumatic.     Right Ear: Tympanic membrane, ear canal and external ear normal.     Left Ear: Tympanic membrane, ear canal and external ear normal.     Nose: Congestion present.     Mouth/Throat:     Mouth: Mucous membranes are moist.     Pharynx: Oropharynx is clear. No oropharyngeal exudate or posterior oropharyngeal erythema.  Eyes:      General:        Right eye: No discharge.        Left eye: No discharge.     Conjunctiva/sclera: Conjunctivae normal.  Cardiovascular:     Rate and Rhythm: Normal rate and regular rhythm.     Pulses: Normal pulses.     Heart sounds: Normal heart sounds. No murmur heard. Pulmonary:     Effort: Pulmonary effort is normal. No respiratory distress.     Breath sounds: Normal breath sounds. No wheezing or rhonchi.  Musculoskeletal:     Cervical back: Neck supple. No tenderness.     Right lower leg: No edema.     Left lower leg: No edema.  Lymphadenopathy:     Cervical: No cervical adenopathy.  Skin:    General: Skin is warm and dry.  Neurological:     General: No focal deficit present.     Mental Status: He is alert and oriented to person, place, and time.  Psychiatric:        Mood and Affect: Mood normal.        Behavior: Behavior normal.     No results found for any visits on 11/06/23.      Assessment & Plan:   Roy [name] was seen today for nasal congestion.  Diagnoses and all orders for this visit:  Mild intermittent asthma with acute exacerbation Steroid IM injection today in office. Discussed nebulizer/albuterol prn for shortness of breath, wheezing, chest tightness.  -  methylPREDNISolone acetate (DEPO-MEDROL) injection 80 mg  URI, acute Complete doxycyline as prescribed. Discussed symptomatic care and return precautions.    Return if symptoms worsen or fail to improve.  The patient indicates understanding of these issues and agrees with the plan.  Gabriel Earing, FNP

## 2023-11-24 ENCOUNTER — Ambulatory Visit: Payer: Self-pay | Admitting: Family Medicine

## 2023-11-24 ENCOUNTER — Encounter: Payer: Self-pay | Admitting: Family Medicine

## 2023-11-24 ENCOUNTER — Other Ambulatory Visit: Payer: Self-pay | Admitting: Family Medicine

## 2023-11-24 ENCOUNTER — Ambulatory Visit: Admitting: Family Medicine

## 2023-11-24 VITALS — BP 116/74 | HR 71 | Temp 98.0°F | Ht 68.0 in | Wt 323.0 lb

## 2023-11-24 DIAGNOSIS — J4541 Moderate persistent asthma with (acute) exacerbation: Secondary | ICD-10-CM | POA: Diagnosis not present

## 2023-11-24 DIAGNOSIS — K2100 Gastro-esophageal reflux disease with esophagitis, without bleeding: Secondary | ICD-10-CM | POA: Diagnosis not present

## 2023-11-24 MED ORDER — MOXIFLOXACIN HCL 400 MG PO TABS: 400.0000 mg | ORAL_TABLET | Freq: Every day | ORAL | 0 refills | Status: DC

## 2023-11-24 MED ORDER — FAMOTIDINE 40 MG PO TABS: 40.0000 mg | ORAL_TABLET | Freq: Two times a day (BID) | ORAL | 0 refills | Status: DC

## 2023-11-24 MED ORDER — BETAMETHASONE SOD PHOS & ACET 6 (3-3) MG/ML IJ SUSP
6.0000 mg | Freq: Once | INTRAMUSCULAR | Status: AC
  Administered 2023-11-24: 6 mg via INTRAMUSCULAR

## 2023-11-24 NOTE — Progress Notes (Signed)
 Chief Complaint  Patient presents with   Nasal Congestion    Two weeks of congestion. Seems to be worse.  A little mucous will come up and its clear to cloudy. Mucinex dayquil and nightquil.     HPI  Patient presents today for Patient presents with upper respiratory congestion. Phlegm that is frequently purulent. Purulent rhinorrhea. No sore throat. Patient reports not coughing There is no fever, chills or sweats. The patient denies being short of breath. Onset was 26 days ago. Gradually worsening. Tried OTCs without improvement. Some relief with prior treatment with Ms. Lequita Halt, Ms. Hawks.   PMH: Smoking status noted ROS: Per HPI  Objective: BP 116/74   Pulse 71   Temp 98 F (36.7 C)   Ht 5\' 8"  (1.727 m)   Wt (!) 323 lb (146.5 kg)   SpO2 95%   BMI 49.11 kg/m  Gen: NAD, alert, cooperative with exam HEENT: NCAT, Nasal passages swollen, red TMS RED CV: RRR, good S1/S2, no murmur Resp: Bronchitis changes with scattered wheezes, non-labored Ext: No edema, warm Neuro: Alert and oriented, No gross deficits  Assessment and plan:  1. Moderate persistent asthma with acute exacerbation   2. GERD with esophagitis     Meds ordered this encounter  Medications   famotidine (PEPCID) 40 MG tablet    Sig: Take 1 tablet (40 mg total) by mouth 2 (two) times daily.    Dispense:  180 tablet    Refill:  0   moxifloxacin (AVELOX) 400 MG tablet    Sig: Take 1 tablet (400 mg total) by mouth daily.    Dispense:  10 tablet    Refill:  0   betamethasone acetate-betamethasone sodium phosphate (CELESTONE) injection 6 mg    No orders of the defined types were placed in this encounter.   Follow up as needed.  Mechele Claude, MD

## 2023-11-24 NOTE — Telephone Encounter (Signed)
  Chief Complaint: worsening chest congestion Symptoms: chest congestion, cough, runny nose, wheezing Frequency: Beginning of feb Pertinent Negatives: Patient denies CP, SOB, Fever Disposition: [] ED /[] Urgent Care (no appt availability in office) / [x] Appointment(In office/virtual)/ []  [facility]/ [] Home Care/ [] Refused Recommended Disposition /[] [facility]/ []  Follow-up with PCP Additional Notes: Patient calls reporting worsening chest congestion after being treated for URI 11/12/23. Patient states his symptoms improved while on medication, but have since returned. States occasional wheezing. Per protocol, patient to be evaluated within 4 hours. First available appointment with PCP is today at 1255, patient scheduled. Care advice reviewed, patient verbalized understanding and denies further questions at this time. Alerting PCP for review.    Copied from CRM [id number]. Topic: Clinical - Red Word Triage >> Nov 24, 2023  8:04 AM Hamdi H wrote: [facility] that prompted transfer to Nurse Triage: Pt has worsening mucous with chest discomfort since his last visit with [facility]. He felt fine while taking the meds prescribed but once it finished his symptoms worsened. Reason for Disposition  Wheezing is present  Answer Assessment - Initial Assessment Questions 1. ONSET: "When did the cough begin?"      Since first week of feb 2. SEVERITY: "How bad is the cough today?"      Not bad at all, just when forced 3. SPUTUM: "Describe the color of your sputum" (none, dry cough; clear, white, yellow, green)     Clear and cloudy at times 4. HEMOPTYSIS: "Are you coughing up any blood?" If so ask: "How much?" (flecks, streaks, tablespoons, etc.)     Denies 5. DIFFICULTY BREATHING: "Are you having difficulty breathing?" If Yes, ask: "How bad is it?" (e.g., mild, moderate, severe)    - MILD: No SOB at rest, mild SOB with walking, speaks normally in sentences, can lie down, no retractions,  pulse < 100.    - MODERATE: SOB at rest, SOB with minimal exertion and prefers to sit, cannot lie down flat, speaks in phrases, mild retractions, audible wheezing, pulse 100-120.    - SEVERE: Very SOB at rest, speaks in single words, struggling to breathe, sitting hunched forward, retractions, pulse > 120      Denies 6. FEVER: "Do you have a fever?" If Yes, ask: "What is your temperature, how was it measured, and when did it start?"     Denies 7. CARDIAC HISTORY: "Do you have any history of heart disease?" (e.g., heart attack, congestive heart failure)      Denies 8. LUNG HISTORY: "Do you have any history of lung disease?"  (e.g., pulmonary embolus, asthma, emphysema)     Asthma 9. PE RISK FACTORS: "Do you have a history of blood clots?" (or: recent major surgery, recent prolonged travel, bedridden)     Denies 10. OTHER SYMPTOMS: "Do you have any other symptoms?" (e.g., runny nose, wheezing, chest pain)       Runny nose, wheezing occasionally, chest discomfort with deep breath  Protocols used: Cough - Acute Non-Productive-A-AH

## 2023-12-03 ENCOUNTER — Encounter: Payer: Self-pay | Admitting: Family Medicine

## 2023-12-03 ENCOUNTER — Ambulatory Visit (INDEPENDENT_AMBULATORY_CARE_PROVIDER_SITE_OTHER): Admitting: Family Medicine

## 2023-12-03 VITALS — BP 121/77 | HR 72 | Temp 98.8°F | Ht 68.0 in | Wt 328.0 lb

## 2023-12-03 DIAGNOSIS — R1013 Epigastric pain: Secondary | ICD-10-CM

## 2023-12-03 DIAGNOSIS — B9689 Other specified bacterial agents as the cause of diseases classified elsewhere: Secondary | ICD-10-CM | POA: Diagnosis not present

## 2023-12-03 DIAGNOSIS — F411 Generalized anxiety disorder: Secondary | ICD-10-CM

## 2023-12-03 DIAGNOSIS — J4521 Mild intermittent asthma with (acute) exacerbation: Secondary | ICD-10-CM | POA: Diagnosis not present

## 2023-12-03 DIAGNOSIS — J208 Acute bronchitis due to other specified organisms: Secondary | ICD-10-CM

## 2023-12-03 MED ORDER — MOXIFLOXACIN HCL 400 MG PO TABS: 400.0000 mg | ORAL_TABLET | Freq: Every day | ORAL | 0 refills | Status: DC

## 2023-12-03 MED ORDER — DEXILANT 60 MG PO CPDR: 1.0000 | DELAYED_RELEASE_CAPSULE | Freq: Two times a day (BID) | ORAL | 1 refills | Status: DC

## 2023-12-03 MED ORDER — ALBUTEROL SULFATE (2.5 MG/3ML) 0.083% IN NEBU: 2.5000 mg | INHALATION_SOLUTION | Freq: Four times a day (QID) | RESPIRATORY_TRACT | 1 refills | Status: AC | PRN

## 2023-12-03 MED ORDER — QUETIAPINE FUMARATE 200 MG PO TABS: ORAL_TABLET | ORAL | 3 refills | Status: AC

## 2023-12-03 NOTE — Progress Notes (Signed)
 Subjective:  Patient ID: Roy [name], male    DOB: 1971/04/27  Age: 53 y.o. MRN: [id number]  CC: Nasal Congestion (Slightly cloudy and sometimes clear. No better or worse.) and Pain (Lefts side sharp pain in ribs that went around to side. Lasting only 15 minutes. No other symptoms. )   HPI Roy [name] presents for having substernal burning and hurting. Also belching. Sharp chest pain went to left side for 15 min yesterday.  Nasal congestion blowing some clear some yellow mucous.    Pain is moderate, intermittent. Sharp. LUQ. No NVD.     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   He has a past surgical history that includes Rotator cuff repair (Right); Biceps tendon repair (Right); Neck surgery; and Knee arthroscopy with lateral menisectomy (Left, 10/09/2021).   His family history includes Arrhythmia in his father; Asthma in his brother and daughter; Bipolar disorder in his brother; COPD in his father; Fibromyalgia in his sister; Hypertension in his mother; Stroke (age of onset: 85) in his mother.He reports that he has been smoking cigarettes. He has a 16.5 pack-year smoking history. He quit  smokeless tobacco use about 4 years ago.  His smokeless tobacco use included snuff and chew. He reports that he does not drink alcohol and does not use drugs.    ROS Review of Systems  Constitutional:  Negative for activity change, appetite change, chills and fever.  HENT:  Positive for congestion, postnasal drip, rhinorrhea and sinus pressure. Negative for ear discharge, ear pain, hearing loss, nosebleeds, sneezing and trouble swallowing.   Respiratory:  Negative for chest tightness and shortness of breath.   Cardiovascular:  Negative for chest pain and palpitations.  Skin:  Negative for rash.    Objective:  BP 121/77   Pulse 72   Temp 98.8 F (37.1 C)   Ht 5\' 8"  (1.727 m)   Wt (!) 328 lb (148.8 kg)   SpO2 93%   BMI 49.87 kg/m   BP Readings from Last 3 Encounters:  12/03/23 121/77  11/24/23 116/74  11/06/23 127/76    Wt Readings from Last 3 Encounters:  12/03/23 (!) 328 lb (148.8 kg)  11/24/23 (!) 323 lb (146.5 kg)  11/06/23 (!) 325 lb (147.4 kg)     Physical Exam Constitutional:      Appearance: He is well-developed.  HENT:     Head: Normocephalic and atraumatic.     Right Ear: Tympanic membrane and external ear normal. No decreased hearing noted.     Left Ear: Tympanic membrane and external ear normal. No decreased hearing noted.  Nose: Mucosal edema present.     Right Sinus: No frontal sinus tenderness.     Left Sinus: No frontal sinus tenderness.     Mouth/Throat:     Pharynx: No oropharyngeal exudate or posterior oropharyngeal erythema.  Neck:     Meningeal: Brudzinski's sign absent.  Pulmonary:     Effort: No respiratory distress.     Breath sounds: Normal breath sounds.  Abdominal:     Palpations: There is no mass.     Tenderness: There is abdominal tenderness (LUQ). There is no guarding or rebound.  Lymphadenopathy:     Head:     Right side of head: No preauricular adenopathy.     Left side of head: No preauricular adenopathy.     Cervical:      Right cervical: No superficial cervical adenopathy.    Left cervical: No superficial cervical adenopathy.       Assessment & Plan:   Roy [name] was seen today for nasal congestion and pain.  Diagnoses and all orders for this visit:  Epigastric abdominal pain -     DEXILANT 60 MG capsule; Take 1 capsule (60 mg total) by mouth 2 (two) times daily. -     US Abdomen Complete; Future  Mild intermittent asthma with acute exacerbation -     albuterol (PROVENTIL) (2.5 MG/3ML) 0.083% nebulizer solution; Take 3 mLs (2.5 mg total) by nebulization every 6 (six) hours as needed.  Acute bacterial bronchitis -     moxifloxacin (AVELOX) 400 MG tablet; Take 1 tablet (400 mg total) by mouth daily. -     albuterol (PROVENTIL) (2.5 MG/3ML) 0.083% nebulizer solution; Take 3 mLs (2.5 mg total) by nebulization every 6 (six) hours as needed.  GAD (generalized anxiety disorder) -     QUEtiapine (SEROQUEL) 200 MG tablet; TAKE 1 TABLET BY MOUTH AT BEDTIME FOR  NERVES       I have discontinued Roy [name]'s moxifloxacin. I have also changed his Dexilant. Additionally, I am having him start on moxifloxacin. Lastly, I am having him maintain his acetaminophen, sildenafil, vitamin C, zinc gluconate, docusate sodium, Blood Glucose Monitoring Suppl, fluticasone, ondansetron, fexofenadine-pseudoephedrine, traZODone, ALPRAZolam, linaclotide, tirzepatide, Vitamin D (Ergocalciferol), DULoxetine, gemfibrozil, levothyroxine, metoprolol succinate, rosuvastatin, albuterol, Symbicort, tamsulosin, famotidine, Accu-Chek Guide Test, albuterol, and QUEtiapine.  Allergies as of 12/03/2023       Reactions   Penicillins Rash, Other (See Comments)   Has patient had a PCN reaction causing immediate rash, facial/tongue/throat swelling, SOB or lightheadedness with hypotension: Yes Has patient had a PCN reaction causing severe rash involving mucus membranes or skin necrosis: No Has patient had a PCN reaction that required  hospitalization: No Has patient had a PCN reaction occurring within the last 10 years: No If all of the above answers are "NO", then may proceed with Cephalosporin use. Has patient had a PCN reaction causing immediate rash, facial/tongue/throat swelling, SOB or lightheadedness with hypotension: Yes Has patient had a PCN reaction causing severe rash involving mucus membranes or skin necrosis: No Has patient had a PCN reaction that required hospitalization: No Has patient had a PCN reaction occurring within the last 10 years: No If all of the above answers are "NO", then may proceed with Cephalosporin use. unknown   Chantix [varenicline] Other (See Comments)   Severe headache        Medication List        Accurate as of December 03, 2023 11:59 PM. If you have any questions, ask your nurse or doctor.  Accu-Chek Guide Test test strip Generic drug: glucose blood Test in the morning, at noon and at bedtime Dx R73.03   acetaminophen 500 MG tablet Commonly known as: TYLENOL Take 1,000 mg by mouth every 6 (six) hours as needed for headache (pain).   albuterol 108 (90 Base) MCG/ACT inhaler Commonly known as: VENTOLIN HFA INHALE 1-2 PUFFS BY MOUTH EVERY 6 HOURS AS NEEDED FOR WHEEZE OR SHORTNESS OF BREATH   albuterol (2.5 MG/3ML) 0.083% nebulizer solution Commonly known as: PROVENTIL Take 3 mLs (2.5 mg total) by nebulization every 6 (six) hours as needed.   ALPRAZolam 0.5 MG tablet Commonly known as: XANAX Take 1 tablet (0.5 mg total) by mouth 3 (three) times daily as needed. for anxiety   Blood Glucose Monitoring Suppl Devi 1 each by Does not apply route in the morning, at noon, and at bedtime. May substitute to any manufacturer covered by patient's insurance.   Dexilant 60 MG capsule Generic drug: dexlansoprazole Take 1 capsule (60 mg total) by mouth 2 (two) times daily. What changed: when to take this Changed by: Caylah Plouff   docusate sodium 100 MG  capsule Commonly known as: COLACE Take 100 mg by mouth daily.   DULoxetine 60 MG capsule Commonly known as: CYMBALTA Take 2 capsules (120 mg total) by mouth daily.   famotidine 40 MG tablet Commonly known as: Pepcid Take 1 tablet (40 mg total) by mouth 2 (two) times daily.   fexofenadine-pseudoephedrine 180-240 MG 24 hr tablet Commonly known as: ALLEGRA-D 24 Take 1 tablet by mouth every evening. For allergy and congestion   fluticasone 50 MCG/ACT nasal spray Commonly known as: FLONASE SPRAY 2 SPRAYS INTO EACH NOSTRIL EVERY DAY   gemfibrozil 600 MG tablet Commonly known as: LOPID Take 1 tablet (600 mg total) by mouth 2 (two) times daily.   levothyroxine 50 MCG tablet Commonly known as: SYNTHROID Take 1 tablet (50 mcg total) by mouth daily.   linaclotide 290 MCG Caps capsule Commonly known as: LINZESS Take 1 capsule (290 mcg total) by mouth daily as needed (constipation).   metoprolol succinate 50 MG 24 hr tablet Commonly known as: TOPROL-XL Take 1 tablet (50 mg total) by mouth daily. Take with or immediately following a meal.   moxifloxacin 400 MG tablet Commonly known as: AVELOX Take 1 tablet (400 mg total) by mouth daily.   ondansetron 4 MG tablet Commonly known as: Zofran Take 1 tablet (4 mg total) by mouth every 8 (eight) hours as needed for nausea or vomiting.   QUEtiapine 200 MG tablet Commonly known as: SEROQUEL TAKE 1 TABLET BY MOUTH AT BEDTIME FOR  NERVES   rosuvastatin 5 MG tablet Commonly known as: CRESTOR Take 1 tablet (5 mg total) by mouth daily. For cholesterol   sildenafil 20 MG tablet Commonly known as: REVATIO Take 2-5 tablets daily as needed for sex   Symbicort 160-4.5 MCG/ACT inhaler Generic drug: budesonide-formoterol INHALE 2 PUFFS BY MOUTH TWICE DAILY FOR ASTHMA   tamsulosin 0.4 MG Caps capsule Commonly known as: FLOMAX TAKE 1 CAPSULE BY MOUTH EVERY DAY   tirzepatide 10 MG/0.5ML Pen Commonly known as: MOUNJARO Inject 10 mg into  the skin once a week.   traZODone 100 MG tablet Commonly known as: DESYREL TAKE 1 TABLET BY MOUTH EVERYDAY AT BEDTIME   vitamin C 1000 MG tablet Take 1,000 mg by mouth daily.   Vitamin D (Ergocalciferol) 1.25 MG (50000 UNIT) Caps capsule Commonly known as: DRISDOL TAKE 1 CAPSULE (50,000 UNITS TOTAL) BY MOUTH EVERY 7 (  SEVEN) DAYS   zinc gluconate 50 MG tablet Take 50 mg by mouth daily.         Follow-up: Return in about 6 weeks (around 01/14/2024), or if symptoms worsen or fail to improve.  Mechele Claude, M.D.

## 2023-12-06 ENCOUNTER — Encounter: Payer: Self-pay | Admitting: Family Medicine

## 2023-12-07 ENCOUNTER — Other Ambulatory Visit: Payer: Self-pay | Admitting: Family Medicine

## 2023-12-07 DIAGNOSIS — R1013 Epigastric pain: Secondary | ICD-10-CM

## 2023-12-18 ENCOUNTER — Ambulatory Visit (HOSPITAL_COMMUNITY)
Admission: RE | Admit: 2023-12-18 | Discharge: 2023-12-18 | Disposition: A | Discharge status: Home / Self Care | Source: Ambulatory Visit | Attending: Family Medicine | Admitting: Family Medicine

## 2023-12-18 DIAGNOSIS — R1013 Epigastric pain: Secondary | ICD-10-CM | POA: Diagnosis not present

## 2023-12-18 DIAGNOSIS — R1084 Generalized abdominal pain: Secondary | ICD-10-CM | POA: Diagnosis not present

## 2024-01-05 ENCOUNTER — Other Ambulatory Visit: Payer: Self-pay | Admitting: Family Medicine

## 2024-01-05 ENCOUNTER — Encounter: Payer: Self-pay | Admitting: Family Medicine

## 2024-01-05 ENCOUNTER — Ambulatory Visit (INDEPENDENT_AMBULATORY_CARE_PROVIDER_SITE_OTHER): Payer: Medicare HMO | Admitting: Family Medicine

## 2024-01-05 VITALS — BP 107/72 | HR 66 | Temp 98.6°F | Ht 68.0 in | Wt 319.0 lb

## 2024-01-05 DIAGNOSIS — Z6841 Body Mass Index (BMI) 40.0 and over, adult: Secondary | ICD-10-CM

## 2024-01-05 DIAGNOSIS — I10 Essential (primary) hypertension: Secondary | ICD-10-CM

## 2024-01-05 DIAGNOSIS — B9689 Other specified bacterial agents as the cause of diseases classified elsewhere: Secondary | ICD-10-CM

## 2024-01-05 DIAGNOSIS — E119 Type 2 diabetes mellitus without complications: Secondary | ICD-10-CM

## 2024-01-05 DIAGNOSIS — F411 Generalized anxiety disorder: Secondary | ICD-10-CM | POA: Diagnosis not present

## 2024-01-05 DIAGNOSIS — R399 Unspecified symptoms and signs involving the genitourinary system: Secondary | ICD-10-CM

## 2024-01-05 LAB — BAYER DCA HB A1C WAIVED: HB A1C (BAYER DCA - WAIVED): 5.7 % — ABNORMAL HIGH (ref 4.8–5.6)

## 2024-01-05 MED ORDER — ALPRAZOLAM 0.5 MG PO TABS: 0.5000 mg | ORAL_TABLET | Freq: Three times a day (TID) | ORAL | 5 refills | Status: DC | PRN

## 2024-01-05 MED ORDER — TIRZEPATIDE 12.5 MG/0.5ML ~~LOC~~ SOAJ: 12.5000 mg | SUBCUTANEOUS | 3 refills | Status: DC

## 2024-01-05 MED ORDER — GEMFIBROZIL 600 MG PO TABS: 600.0000 mg | ORAL_TABLET | Freq: Two times a day (BID) | ORAL | 3 refills | Status: AC

## 2024-01-05 NOTE — Progress Notes (Signed)
 Subjective:  Patient ID: Roy [name],  male    DOB: Sep 17, 1971  Age: 53 y.o.    CC: Medical Management of Chronic Issues (Mounjaro not working as well as it was. Weight not going down for the last few months. )   HPI Roy [name] presents for  follow-up of hypertension. Patient has no history of headache chest pain or shortness of breath or recent cough. Patient also denies symptoms of TIA such as numbness weakness lateralizing. Patient denies side effects from medication. States taking it regularly.  Patient also  in for follow-up of elevated cholesterol. Doing well without complaints on current medication. Denies side effects  including myalgia and arthralgia and nausea. Also in today for liver function testing. Currently no chest pain, shortness of breath or other cardiovascular related symptoms noted.  Follow-up of diabetes. Patient does check blood sugar at home. Readings run between 100 and 150 Patient denies symptoms such as excessive hunger or urinary frequency, excessive hunger, nausea No significant hypoglycemic spells noted. Medications reviewed. Pt reports taking them regularly. Pt. denies complication/adverse reaction today.    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   He has a past surgical history that includes Rotator cuff repair (Right); Biceps tendon repair (Right); Neck surgery; and Knee arthroscopy with lateral menisectomy (Left, 10/09/2021).   His family history includes Arrhythmia in his father; Asthma in his brother and daughter; Bipolar disorder in his brother; COPD in his father; Fibromyalgia in his sister; Hypertension in his mother; Stroke (age of onset: 14) in his  mother.He reports that he has been smoking cigarettes. He has a 16.5 pack-year smoking history. He quit smokeless tobacco use about 4 years ago.  His smokeless tobacco use included snuff and chew. He reports that he does not drink alcohol and does not use drugs.  Current Outpatient Medications on File Prior to Visit  Medication Sig Dispense Refill   acetaminophen (TYLENOL) 500 MG tablet Take 1,000 mg by mouth every 6 (six) hours as needed for headache (pain).      albuterol (PROVENTIL) (2.5 MG/3ML) 0.083% nebulizer solution Take 3 mLs (2.5 mg total) by nebulization every 6 (six) hours as needed. 75 mL 1   Ascorbic Acid (VITAMIN C) 1000 MG tablet Take 1,000 mg by mouth daily.     Blood Glucose Monitoring Suppl DEVI 1 each by Does not apply route in the morning, at noon, and at bedtime. May substitute to any manufacturer covered by patient's insurance. 1 each 0   DEXILANT 60 MG capsule Take 1 capsule (60 mg total) by mouth 2 (two) times daily. 180 capsule 1   docusate sodium (COLACE) 100 MG capsule Take 100 mg by mouth daily.     DULoxetine (CYMBALTA) 60 MG capsule Take 2 capsules (120 mg total) by mouth daily. 180 capsule 3   famotidine (PEPCID) 40 MG tablet Take 1 tablet (40 mg total) by mouth 2 (two) times daily. 180 tablet 0   fexofenadine-pseudoephedrine (ALLEGRA-D 24) 180-240 MG 24 hr tablet Take 1 tablet by mouth every evening. For allergy and congestion 30 tablet 11   glucose blood (ACCU-CHEK GUIDE TEST) test strip Test in the morning, at noon and at bedtime Dx R73.03 300 strip 3   levothyroxine (SYNTHROID) 50 MCG tablet Take 1 tablet (50 mcg  total) by mouth daily. 90 tablet 3   linaclotide (LINZESS) 290 MCG CAPS capsule Take 1 capsule (290 mcg total) by mouth daily as needed (constipation). 90 capsule 3   metoprolol succinate (TOPROL-XL) 50 MG 24 hr tablet Take 1 tablet (50 mg total) by mouth daily. Take with or immediately following a meal. 90 tablet 3   ondansetron (ZOFRAN) 4 MG tablet Take  1 tablet (4 mg total) by mouth every 8 (eight) hours as needed for nausea or vomiting. 10 tablet 0   QUEtiapine (SEROQUEL) 200 MG tablet TAKE 1 TABLET BY MOUTH AT BEDTIME FOR  NERVES 90 tablet 3   rosuvastatin (CRESTOR) 5 MG tablet Take 1 tablet (5 mg total) by mouth daily. For cholesterol 90 tablet 3   sildenafil (REVATIO) 20 MG tablet Take 2-5 tablets daily as needed for sex 180 tablet 1   SYMBICORT 160-4.5 MCG/ACT inhaler INHALE 2 PUFFS BY MOUTH TWICE DAILY FOR ASTHMA 10.2 each 2   traZODone (DESYREL) 100 MG tablet TAKE 1 TABLET BY MOUTH EVERYDAY AT BEDTIME 90 tablet 3   Vitamin D, Ergocalciferol, (DRISDOL) 1.25 MG (50000 UNIT) CAPS capsule TAKE 1 CAPSULE (50,000 UNITS TOTAL) BY MOUTH EVERY 7 (SEVEN) DAYS 13 capsule 3   zinc gluconate 50 MG tablet Take 50 mg by mouth daily.     albuterol (VENTOLIN HFA) 108 (90 Base) MCG/ACT inhaler INHALE 1-2 PUFFS BY MOUTH EVERY 6 HOURS AS NEEDED FOR WHEEZE OR SHORTNESS OF BREATH (Patient not taking: Reported on 11/24/2023) 18 each 2   No current facility-administered medications on file prior to visit.    ROS Review of Systems  Constitutional:  Negative for fever.  Respiratory:  Negative for shortness of breath.   Cardiovascular:  Negative for chest pain.  Musculoskeletal:  Negative for arthralgias.  Skin:  Negative for rash.    Objective:  BP 107/72   Pulse 66   Temp 98.6 F (37 C)   Ht 5\' 8"  (1.727 m)   Wt (!) 319 lb (144.7 kg)   SpO2 95%   BMI 48.50 kg/m   BP Readings from Last 3 Encounters:  01/05/24 107/72  12/03/23 121/77  11/24/23 116/74    Wt Readings from Last 3 Encounters:  01/05/24 (!) 319 lb (144.7 kg)  12/03/23 (!) 328 lb (148.8 kg)  11/24/23 (!) 323 lb (146.5 kg)    Lab Results  Component Value Date   HGBA1C 5.6 10/06/2023   HGBA1C 5.7 (H) 07/02/2023   HGBA1C 5.8 (H) 03/19/2023    Physical Exam Vitals reviewed.  Constitutional:      Appearance: He is well-developed. He is obese.  HENT:     Head: Normocephalic  and atraumatic.     Right Ear: External ear normal.     Left Ear: External ear normal.     Mouth/Throat:     Pharynx: No oropharyngeal exudate or posterior oropharyngeal erythema.  Eyes:     Pupils: Pupils are equal, round, and reactive to light.  Cardiovascular:     Rate and Rhythm: Normal rate and regular rhythm.     Heart sounds: No murmur heard. Pulmonary:     Effort: No respiratory distress.     Breath sounds: Normal breath sounds.  Musculoskeletal:     Cervical back: Normal range of motion and neck supple.  Neurological:     Mental Status: He is alert and oriented to person, place, and time.         Assessment & Plan:  New onset type 2 diabetes  mellitus (HCC) -     Bayer DCA Hb A1c Waived  Benign essential HTN -     Lipid panel -     CMP14+EGFR  Morbid obesity (HCC) -     Bayer DCA Hb A1c Waived -     Lipid panel -     CMP14+EGFR  Acute bacterial bronchitis  Lower urinary tract symptoms  GAD (generalized anxiety disorder) -     ALPRAZolam; Take 1 tablet (0.5 mg total) by mouth 3 (three) times daily as needed. for anxiety  Dispense: 90 tablet; Refill: 5  Other orders -     Gemfibrozil; Take 1 tablet (600 mg total) by mouth 2 (two) times daily.  Dispense: 180 tablet; Refill: 3 -     Tirzepatide; Inject 12.5 mg into the skin once a week.  Dispense: 6 mL; Refill: 3    Follow-up: No follow-ups on file.  Roise Cleaver, M.D.

## 2024-01-06 LAB — LIPID PANEL
Chol/HDL Ratio: 2.9 ratio (ref 0.0–5.0)
Cholesterol, Total: 117 mg/dL (ref 100–199)
HDL: 40 mg/dL (ref >39)
LDL Chol Calc (NIH): 52 mg/dL (ref 0–99)
Triglycerides: 146 mg/dL (ref 0–149)
VLDL Cholesterol Cal: 25 mg/dL (ref 5–40)

## 2024-01-06 LAB — CMP14+EGFR
ALT: 9 IU/L (ref 0–44)
AST: 14 IU/L (ref 0–40)
Albumin: 4.4 g/dL (ref 4.1–5.1)
Alkaline Phosphatase: 84 IU/L (ref 44–121)
BUN/Creatinine Ratio: 6 — ABNORMAL LOW (ref 9–20)
BUN: 6 mg/dL (ref 6–24)
Bilirubin Total: 0.4 mg/dL (ref 0.0–1.2)
CO2: 22 mmol/L (ref 20–29)
Calcium: 9.5 mg/dL (ref 8.7–10.2)
Chloride: 99 mmol/L (ref 96–106)
Creatinine, Ser: 0.94 mg/dL (ref 0.76–1.27)
Globulin, Total: 2.2 g/dL (ref 1.5–4.5)
Glucose: 83 mg/dL (ref 70–99)
Potassium: 4.3 mmol/L (ref 3.5–5.2)
Sodium: 138 mmol/L (ref 134–144)
Total Protein: 6.6 g/dL (ref 6.0–8.5)
eGFR: 99 mL/min/{1.73_m2} (ref >59)

## 2024-01-06 NOTE — Progress Notes (Signed)
Hello [name],  Your lab result is normal and/or stable.Some minor variations that are not significant are commonly marked abnormal, but do not represent any medical problem for you.  Best regards, Tove Wideman, M.D.

## 2024-01-16 ENCOUNTER — Telehealth: Payer: Self-pay

## 2024-01-16 ENCOUNTER — Other Ambulatory Visit (HOSPITAL_COMMUNITY): Payer: Self-pay

## 2024-01-16 ENCOUNTER — Telehealth: Payer: Self-pay | Admitting: Family Medicine

## 2024-01-16 DIAGNOSIS — R1013 Epigastric pain: Secondary | ICD-10-CM

## 2024-01-16 NOTE — Telephone Encounter (Signed)
 Pharmacy Patient Advocate Encounter  Received notification from [employer] that Prior Authorization for Dexilant  60MG  dr capsules has been APPROVED from 09/24/23 to 09/22/24. Ran test claim, Copay is $0. This test claim was processed through [facility]- copay amounts may vary at other pharmacies due to pharmacy/plan contracts, or as the patient moves through the different stages of their insurance plan.   PA #/Case ID/Reference #: [id number]

## 2024-01-16 NOTE — Telephone Encounter (Signed)
 Name from pharmacy: DEXILANT  DR 60 MG CAPSULE  Pharmacy comment: Alternative Requested:PRIOR AUTHORIZATION NEEDED FOR MORE THAN 1 PER DAY.

## 2024-01-16 NOTE — Telephone Encounter (Signed)
 Pharmacy Patient Advocate Encounter   Received notification from Pt Calls Messages that prior authorization for Dexilant  60MG  dr capsules is required/requested.   Insurance verification completed.   The patient is insured through [employer] .   Per test claim: PA required; PA submitted to above mentioned insurance via CoverMyMeds Key/confirmation #/EOC [facility] Status is pending

## 2024-01-16 NOTE — Telephone Encounter (Signed)
 PA request has been Submitted. New Encounter has been or will be created for follow up. For additional info see Pharmacy Prior Auth telephone encounter from 01/16/24.

## 2024-01-19 NOTE — Telephone Encounter (Signed)
 NOTED. ls

## 2024-01-20 ENCOUNTER — Other Ambulatory Visit: Payer: Self-pay

## 2024-01-20 DIAGNOSIS — Z1211 Encounter for screening for malignant neoplasm of colon: Secondary | ICD-10-CM

## 2024-01-21 ENCOUNTER — Other Ambulatory Visit: Payer: Self-pay | Admitting: Family Medicine

## 2024-01-21 ENCOUNTER — Encounter: Payer: Self-pay | Admitting: Family Medicine

## 2024-01-21 DIAGNOSIS — R1013 Epigastric pain: Secondary | ICD-10-CM

## 2024-01-21 MED ORDER — DEXILANT 60 MG PO CPDR: 1.0000 | DELAYED_RELEASE_CAPSULE | Freq: Every day | ORAL | 3 refills | Status: AC

## 2024-01-21 MED ORDER — ESOMEPRAZOLE MAGNESIUM 40 MG PO CPDR: 40.0000 mg | DELAYED_RELEASE_CAPSULE | Freq: Every day | ORAL | 3 refills | Status: AC

## 2024-02-01 ENCOUNTER — Other Ambulatory Visit: Payer: Self-pay | Admitting: Family Medicine

## 2024-02-01 DIAGNOSIS — J418 Mixed simple and mucopurulent chronic bronchitis: Secondary | ICD-10-CM

## 2024-02-09 ENCOUNTER — Encounter (INDEPENDENT_AMBULATORY_CARE_PROVIDER_SITE_OTHER): Payer: Self-pay | Admitting: *Deleted

## 2024-02-19 ENCOUNTER — Other Ambulatory Visit: Payer: Self-pay | Admitting: Family Medicine

## 2024-02-19 DIAGNOSIS — K2100 Gastro-esophageal reflux disease with esophagitis, without bleeding: Secondary | ICD-10-CM

## 2024-04-05 ENCOUNTER — Ambulatory Visit (INDEPENDENT_AMBULATORY_CARE_PROVIDER_SITE_OTHER): Admitting: Family Medicine

## 2024-04-05 ENCOUNTER — Encounter: Payer: Self-pay | Admitting: Family Medicine

## 2024-04-05 VITALS — BP 112/68 | HR 62 | Temp 98.0°F | Ht 68.0 in | Wt 313.0 lb

## 2024-04-05 DIAGNOSIS — E119 Type 2 diabetes mellitus without complications: Secondary | ICD-10-CM

## 2024-04-05 DIAGNOSIS — Z6841 Body Mass Index (BMI) 40.0 and over, adult: Secondary | ICD-10-CM

## 2024-04-05 DIAGNOSIS — K2100 Gastro-esophageal reflux disease with esophagitis, without bleeding: Secondary | ICD-10-CM

## 2024-04-05 LAB — BAYER DCA HB A1C WAIVED: HB A1C (BAYER DCA - WAIVED): 5.5 % (ref 4.8–5.6)

## 2024-04-05 LAB — LIPID PANEL

## 2024-04-05 MED ORDER — MOUNJARO 15 MG/0.5ML ~~LOC~~ SOAJ: 15.0000 mg | SUBCUTANEOUS | 3 refills | Status: AC

## 2024-04-05 MED ORDER — FAMOTIDINE 40 MG PO TABS: 40.0000 mg | ORAL_TABLET | Freq: Two times a day (BID) | ORAL | 0 refills | Status: AC

## 2024-04-05 MED ORDER — GEMFIBROZIL 600 MG PO TABS: 600.0000 mg | ORAL_TABLET | Freq: Two times a day (BID) | ORAL | 0 refills | Status: AC

## 2024-04-05 MED ORDER — TRAZODONE HCL 100 MG PO TABS: 100.0000 mg | ORAL_TABLET | Freq: Every day | ORAL | 3 refills | Status: AC

## 2024-04-05 NOTE — Progress Notes (Signed)
 Subjective:  Patient ID: Roy [name],  male    DOB: [birth year]  Age: 53 y.o.    CC: Medical Management of Chronic Issues (Refills pended/No concers)   HPI Roy [name] presents for  follow-up of hypertension. Patient has no history of headache chest pain or shortness of breath or recent cough. Patient also denies symptoms of TIA such as numbness weakness lateralizing. Patient denies side effects from medication. States taking it regularly.  Patient also  in for follow-up of elevated cholesterol. Doing well without complaints on current medication. Denies side effects  including myalgia and arthralgia and nausea. Also in today for liver function testing. Currently no chest pain, shortness of breath or other cardiovascular related symptoms noted.  Follow-up of diabetes. Patient does check blood sugar at home. Readings run between 90 and 120 Patient denies symptoms such as excessive hunger or urinary frequency, excessive hunger, nausea No significant hypoglycemic spells noted. Medications reviewed. Pt reports taking them regularly. Pt. denies complication/adverse reaction today.    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   He has a past surgical history that includes Rotator cuff repair (Right); Biceps tendon repair (Right); Neck surgery; and Knee arthroscopy with lateral menisectomy (Left, 10/09/2021).   His family history includes Arrhythmia in his father; Asthma in his brother and daughter; Bipolar disorder in his brother; COPD in his father; Fibromyalgia in his sister; Hypertension in his mother; Stroke (age of onset: 52) in his mother.He reports that he has been smoking cigarettes. He has a 16.5  pack-year smoking history. He quit smokeless tobacco use about 4 years ago.  His smokeless tobacco use included snuff and chew. He reports that he does not drink alcohol and does not use drugs.  Current Outpatient Medications on File Prior to Visit  Medication Sig Dispense Refill   acetaminophen  (TYLENOL ) 500 MG tablet Take 1,000 mg by mouth every 6 (six) hours as needed for headache (pain).      albuterol  (PROVENTIL ) (2.5 MG/3ML) 0.083% nebulizer solution Take 3 mLs (2.5 mg total) by nebulization every 6 (six) hours as needed. 75 mL 1   ALPRAZolam  (XANAX ) 0.5 MG tablet Take 1 tablet (0.5 mg total) by mouth 3 (three) times daily as needed. for anxiety 90 tablet 5   Ascorbic Acid (VITAMIN C) 1000 MG tablet Take 1,000 mg by mouth daily.     Blood Glucose Monitoring Suppl DEVI 1 each by Does not apply route in the morning, at noon, and at bedtime. May substitute to any manufacturer covered by patient's insurance. 1 each 0   DEXILANT  60 MG capsule Take 1 capsule (60 mg total) by mouth daily. 90 capsule 3   docusate sodium (COLACE) 100 MG capsule Take 100 mg by mouth daily.     DULoxetine  (CYMBALTA ) 60 MG capsule Take 2 capsules (120 mg total) by mouth daily. 180 capsule 3   esomeprazole  (NEXIUM ) 40 MG capsule Take 1 capsule (40 mg total) by mouth daily. 90 capsule 3   glucose blood (ACCU-CHEK GUIDE TEST) test strip Test in the morning, at noon and at bedtime Dx R73.03 300 strip 3   levothyroxine  (SYNTHROID ) 50 MCG tablet Take 1 tablet (50 mcg total) by mouth daily. 90 tablet 3   linaclotide  (LINZESS ) 290 MCG CAPS capsule Take 1 capsule (290  mcg total) by mouth daily as needed (constipation). 90 capsule 3   metoprolol  succinate (TOPROL -XL) 50 MG 24 hr tablet Take 1 tablet (50 mg total) by mouth daily. Take with or immediately following a meal. 90 tablet 3   ondansetron  (ZOFRAN ) 4 MG tablet Take 1 tablet (4 mg total) by mouth every 8 (eight) hours as needed for nausea or vomiting. 10 tablet 0    QUEtiapine  (SEROQUEL ) 200 MG tablet TAKE 1 TABLET BY MOUTH AT BEDTIME FOR  NERVES 90 tablet 3   rosuvastatin  (CRESTOR ) 5 MG tablet Take 1 tablet (5 mg total) by mouth daily. For cholesterol 90 tablet 3   sildenafil  (REVATIO ) 20 MG tablet Take 2-5 tablets daily as needed for sex 180 tablet 1   SYMBICORT  160-4.5 MCG/ACT inhaler INHALE 2 PUFFS BY MOUTH TWICE DAILY FOR ASTHMA 10.2 each 2   Vitamin D , Ergocalciferol , (DRISDOL ) 1.25 MG (50000 UNIT) CAPS capsule TAKE 1 CAPSULE (50,000 UNITS TOTAL) BY MOUTH EVERY 7 (SEVEN) DAYS 13 capsule 3   zinc gluconate 50 MG tablet Take 50 mg by mouth daily.     albuterol  (VENTOLIN  HFA) 108 (90 Base) MCG/ACT inhaler INHALE 1-2 PUFFS BY MOUTH EVERY 6 HOURS AS NEEDED FOR WHEEZE OR SHORTNESS OF BREATH (Patient not taking: Reported on 11/24/2023) 18 each 2   fexofenadine -pseudoephedrine (ALLEGRA-D 24) 180-240 MG 24 hr tablet Take 1 tablet by mouth every evening. For allergy and congestion 30 tablet 11   gemfibrozil  (LOPID ) 600 MG tablet Take 1 tablet (600 mg total) by mouth 2 (two) times daily. (Patient not taking: Reported on 04/05/2024) 180 tablet 3   No current facility-administered medications on file prior to visit.    ROS Review of Systems  Constitutional:  Negative for fever.  Respiratory:  Negative for shortness of breath.   Cardiovascular:  Negative for chest pain.  Musculoskeletal:  Negative for arthralgias.  Skin:  Negative for rash.    Objective:  BP 112/68   Pulse 62   Temp 98 F (36.7 C)   Ht 5' 8 (1.727 m)   Wt (!) 313 lb (142 kg)   SpO2 93%   BMI 47.59 kg/m   BP Readings from Last 3 Encounters:  04/05/24 112/68  01/05/24 107/72  12/03/23 121/77    Wt Readings from Last 3 Encounters:  04/05/24 (!) 313 lb (142 kg)  01/05/24 (!) 319 lb (144.7 kg)  12/03/23 (!) 328 lb (148.8 kg)    Lab Results  Component Value Date   HGBA1C 5.5 04/05/2024   HGBA1C 5.7 (H) 01/05/2024   HGBA1C 5.6 10/06/2023    Physical Exam Vitals reviewed.   Constitutional:      Appearance: He is well-developed.  HENT:     Head: Normocephalic and atraumatic.     Right Ear: External ear normal.     Left Ear: External ear normal.     Mouth/Throat:     Pharynx: No oropharyngeal exudate or posterior oropharyngeal erythema.  Eyes:     Pupils: Pupils are equal, round, and reactive to light.  Cardiovascular:     Rate and Rhythm: Normal rate and regular rhythm.     Heart sounds: No murmur heard. Pulmonary:     Effort: No respiratory distress.     Breath sounds: Normal breath sounds.  Musculoskeletal:     Cervical back: Normal range of motion and neck supple.  Neurological:     Mental Status: He is alert and oriented to person, place, and time.  Assessment & Plan:  New onset type 2 diabetes mellitus (HCC) -     Bayer DCA Hb A1c Waived -     Microalbumin / creatinine urine ratio -     Bayer DCA Hb A1c Waived; Future  Morbid obesity (HCC) -     CMP14+EGFR -     Lipid panel  Gastroesophageal reflux disease with esophagitis without hemorrhage -     Famotidine ; Take 1 tablet (40 mg total) by mouth 2 (two) times daily.  Dispense: 180 tablet; Refill: 0  Other orders -     traZODone  HCl; Take 1 tablet (100 mg total) by mouth at bedtime.  Dispense: 90 tablet; Refill: 3 -     Gemfibrozil ; Take 1 tablet (600 mg total) by mouth 2 (two) times daily.  Dispense: 180 tablet; Refill: 0 -     Mounjaro ; Inject 15 mg into the skin once a week.  Dispense: 6 mL; Refill: 3    Follow-up: No follow-ups on file.  Butler Der, M.D.

## 2024-04-06 ENCOUNTER — Ambulatory Visit: Payer: Self-pay | Admitting: Family Medicine

## 2024-04-06 LAB — CMP14+EGFR
ALT: 11 IU/L (ref 0–44)
AST: 14 IU/L (ref 0–40)
Albumin: 4.5 g/dL (ref 4.1–5.1)
Alkaline Phosphatase: 78 IU/L (ref 44–121)
BUN/Creatinine Ratio: 7 — ABNORMAL LOW (ref 9–20)
BUN: 6 mg/dL (ref 6–24)
Bilirubin Total: 0.2 mg/dL (ref 0.0–1.2)
CO2: 18 mmol/L — ABNORMAL LOW (ref 20–29)
Calcium: 9.4 mg/dL (ref 8.7–10.2)
Chloride: 102 mmol/L (ref 96–106)
Creatinine, Ser: 0.89 mg/dL (ref 0.76–1.27)
Globulin, Total: 2.2 g/dL (ref 1.5–4.5)
Glucose: 83 mg/dL (ref 70–99)
Potassium: 3.9 mmol/L (ref 3.5–5.2)
Sodium: 137 mmol/L (ref 134–144)
Total Protein: 6.7 g/dL (ref 6.0–8.5)
eGFR: 104 mL/min/1.73 (ref >59)

## 2024-04-06 LAB — LIPID PANEL
Cholesterol, Total: 117 mg/dL (ref 100–199)
HDL: 36 mg/dL — AB (ref >39)
LDL CALC COMMENT:: 3.3 ratio (ref 0.0–5.0)
LDL Chol Calc (NIH): 53 mg/dL (ref 0–99)
Triglycerides: 162 mg/dL — ABNORMAL HIGH (ref 0–149)
VLDL Cholesterol Cal: 28 mg/dL (ref 5–40)

## 2024-04-06 LAB — MICROALBUMIN / CREATININE URINE RATIO
Creatinine, Urine: 43.7 mg/dL
Microalb/Creat Ratio: <7 mg/g{creat} (ref 0–29)
Microalbumin, Urine: <3 ug/mL

## 2024-04-06 NOTE — Progress Notes (Signed)
Hello [name],  Your lab result is normal and/or stable.Some minor variations that are not significant are commonly marked abnormal, but do not represent any medical problem for you.  Best regards, Tove Wideman, M.D.

## 2024-04-12 ENCOUNTER — Ambulatory Visit

## 2024-04-12 VITALS — BP 112/68 | HR 62 | Ht 68.0 in | Wt 313.0 lb

## 2024-04-12 DIAGNOSIS — Z0000 Encounter for general adult medical examination without abnormal findings: Secondary | ICD-10-CM | POA: Diagnosis not present

## 2024-04-12 NOTE — Patient Instructions (Signed)
 Roy [name] , Thank you for taking time out of your busy schedule to complete your Annual Wellness Visit with me. I enjoyed our conversation and look forward to speaking with you again next year. I, as well as your care team,  appreciate your ongoing commitment to your health goals. Please review the following plan we discussed and let me know if I can assist you in the future. Your Game plan/ To Do List   Follow up Visits: Next Medicare AWV with our clinical staff: 04/13/25 at 2:30p.m.   Next Office Visit with your provider: 07/07/24 at 8:10a.m.  Clinician Recommendations:  Aim for 30 minutes of exercise or brisk walking, 6-8 glasses of water, and 5 servings of fruits and vegetables each day.       This is a list of the screening recommended for you and due dates:  Health Maintenance  Topic Date Due   Hepatitis C Screening  Never done   Hepatitis B Vaccine (1 of 3 - 19+ 3-dose series) Never done   Zoster (Shingles) Vaccine (1 of 2) Never done   Medicare Annual Wellness Visit  12/17/2023   COVID-19 Vaccine (1) 04/21/2024*   Pneumococcal Vaccination (1 of 2 - PCV) 10/05/2024*   Colon Cancer Screening  10/30/2024*   Flu Shot  04/23/2024   DTaP/Tdap/Td vaccine (6 - Td or Tdap) 11/16/2024   Yearly kidney function blood test for diabetes  04/05/2025   Yearly kidney health urinalysis for diabetes  04/05/2025   HIV Screening  Completed   HPV Vaccine  Aged Out   Meningitis B Vaccine  Aged Out  *Topic was postponed. The date shown is not the original due date.    Advanced directives: (Declined) Advance directive discussed with you today. Even though you declined this today, please call our office should you change your mind, and we can give you the proper paperwork for you to fill out. Advance Care Planning is important because it:  [x]  Makes sure you receive the medical care that is consistent with your values, goals, and preferences  [x]  It provides guidance to your family and loved ones and  reduces their decisional burden about whether or not they are making the right decisions based on your wishes.  Follow the link provided in your after visit summary or read over the paperwork we have mailed to you to help you started getting your Advance Directives in place. If you need assistance in completing these, please reach out to us  so that we can help you!  See attachments for Preventive Care and Fall Prevention Tips.

## 2024-04-12 NOTE — Progress Notes (Signed)
 Subjective:   Roy [name] is a 53 y.o. who presents for a Medicare Wellness preventive visit.  As a reminder, Annual Wellness Visits don't include a physical exam, and some assessments may be limited, especially if this visit is performed virtually. We may recommend an in-person follow-up visit with your provider if needed.  Visit Complete: Virtual I connected with  Roy [name] on 04/12/24 by a audio enabled telemedicine application and verified that I am speaking with the correct person using two identifiers.  Patient Location: Home  Provider Location: Home Office  I discussed the limitations of evaluation and management by telemedicine. The patient expressed understanding and agreed to proceed.  Vital Signs: Because this visit was a virtual/telehealth visit, some criteria may be missing or patient reported. Any vitals not documented were not able to be obtained and vitals that have been documented are patient reported.  VideoDeclined- This patient declined Librarian, academic. Therefore the visit was completed with audio only.  Persons Participating in Visit: Patient.  AWV Questionnaire: No: Patient Medicare AWV questionnaire was not completed prior to this visit.  Cardiac Risk Factors include: advanced age (>47men, >21 women);hypertension;male gender;obesity (BMI >30kg/m2);smoking/ tobacco exposure     Objective:    Today's Vitals   04/12/24 1611  BP: 112/68  Pulse: 62  Weight: (!) 313 lb (142 kg)  Height: 5' 8 (1.727 m)   Body mass index is 47.59 kg/m.     04/12/2024    4:06 PM 08/11/2023   11:46 AM 12/19/2022    4:30 PM 12/17/2022    8:14 AM 09/07/2022   10:35 AM 07/18/2022    8:21 AM 12/27/2021    5:19 PM  Advanced Directives  Does Patient Have a Medical Advance Directive? No No No No No No No  Would patient like information on creating a medical advance directive?  No - Patient declined  No - Patient declined No - Patient  declined No - Patient declined No - Patient declined    Current Medications (verified) Outpatient Encounter Medications as of 04/12/2024  Medication Sig   acetaminophen  (TYLENOL ) 500 MG tablet Take 1,000 mg by mouth every 6 (six) hours as needed for headache (pain).    albuterol  (PROVENTIL ) (2.5 MG/3ML) 0.083% nebulizer solution Take 3 mLs (2.5 mg total) by nebulization every 6 (six) hours as needed.   ALPRAZolam  (XANAX ) 0.5 MG tablet Take 1 tablet (0.5 mg total) by mouth 3 (three) times daily as needed. for anxiety   Ascorbic Acid (VITAMIN C) 1000 MG tablet Take 1,000 mg by mouth daily.   Blood Glucose Monitoring Suppl DEVI 1 each by Does not apply route in the morning, at noon, and at bedtime. May substitute to any manufacturer covered by patient's insurance.   DEXILANT  60 MG capsule Take 1 capsule (60 mg total) by mouth daily.   docusate sodium (COLACE) 100 MG capsule Take 100 mg by mouth daily.   DULoxetine  (CYMBALTA ) 60 MG capsule Take 2 capsules (120 mg total) by mouth daily.   esomeprazole  (NEXIUM ) 40 MG capsule Take 1 capsule (40 mg total) by mouth daily.   famotidine  (PEPCID ) 40 MG tablet Take 1 tablet (40 mg total) by mouth 2 (two) times daily.   fexofenadine -pseudoephedrine (ALLEGRA-D 24) 180-240 MG 24 hr tablet Take 1 tablet by mouth every evening. For allergy and congestion   gemfibrozil  (LOPID ) 600 MG tablet Take 1 tablet (600 mg total) by mouth 2 (two) times daily.   glucose blood (ACCU-CHEK GUIDE TEST)  test strip Test in the morning, at noon and at bedtime Dx R73.03   levothyroxine  (SYNTHROID ) 50 MCG tablet Take 1 tablet (50 mcg total) by mouth daily.   linaclotide  (LINZESS ) 290 MCG CAPS capsule Take 1 capsule (290 mcg total) by mouth daily as needed (constipation).   metoprolol  succinate (TOPROL -XL) 50 MG 24 hr tablet Take 1 tablet (50 mg total) by mouth daily. Take with or immediately following a meal.   ondansetron  (ZOFRAN ) 4 MG tablet Take 1 tablet (4 mg total) by mouth every  8 (eight) hours as needed for nausea or vomiting.   QUEtiapine  (SEROQUEL ) 200 MG tablet TAKE 1 TABLET BY MOUTH AT BEDTIME FOR  NERVES   rosuvastatin  (CRESTOR ) 5 MG tablet Take 1 tablet (5 mg total) by mouth daily. For cholesterol   sildenafil  (REVATIO ) 20 MG tablet Take 2-5 tablets daily as needed for sex   SYMBICORT  160-4.5 MCG/ACT inhaler INHALE 2 PUFFS BY MOUTH TWICE DAILY FOR ASTHMA   tirzepatide  (MOUNJARO ) 15 MG/0.5ML Pen Inject 15 mg into the skin once a week.   traZODone  (DESYREL ) 100 MG tablet Take 1 tablet (100 mg total) by mouth at bedtime.   Vitamin D , Ergocalciferol , (DRISDOL ) 1.25 MG (50000 UNIT) CAPS capsule TAKE 1 CAPSULE (50,000 UNITS TOTAL) BY MOUTH EVERY 7 (SEVEN) DAYS   zinc gluconate 50 MG tablet Take 50 mg by mouth daily.   albuterol  (VENTOLIN  HFA) 108 (90 Base) MCG/ACT inhaler INHALE 1-2 PUFFS BY MOUTH EVERY 6 HOURS AS NEEDED FOR WHEEZE OR SHORTNESS OF BREATH (Patient not taking: Reported on 04/12/2024)   gemfibrozil  (LOPID ) 600 MG tablet Take 1 tablet (600 mg total) by mouth 2 (two) times daily. (Patient not taking: Reported on 04/12/2024)   No facility-administered encounter medications on file as of 04/12/2024.    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- no surgery   Depression    Diabetes mellitus without complication (HCC)    High cholesterol    Hypertension    Hypothyroidism    Low testosterone     Median nerve dysfunction    Radiculopathy of cervical region    Thyroid  disease    Ulnar neuropathy at elbow    Weakness    Past Surgical History:  Procedure Laterality Date   BICEPS TENDON REPAIR Right    KNEE ARTHROSCOPY WITH LATERAL MENISECTOMY Left 10/09/2021   Procedure: KNEE ARTHROSCOPY WITH LATERAL AND MEDIAL MENISCECTOMY;  Surgeon: Margrette Taft BRAVO, MD;  Location: [facility];  Service: Orthopedics;  Laterality: Left;   NECK SURGERY     Fusion done two seperate times   ROTATOR CUFF REPAIR Right    Family History  Problem Relation Age of Onset   Hypertension Mother    Stroke Mother 77   Arrhythmia Father    COPD Father    Fibromyalgia Sister    Asthma Brother    Bipolar disorder Brother    Asthma Daughter    Social History   Socioeconomic History   Marital status: Divorced    Spouse name: Not on file   Number of children: 1   Years of education: GED   Highest education level: GED or equivalent  Occupational History   Occupation: Disability    Comment: Worked as Advice worker  Tobacco Use   Smoking status:  Every Day    Current packs/day: 0.50    Average packs/day: 0.5 packs/day for 33.0 years (16.5 ttl pk-yrs)    Types: Cigarettes   Smokeless tobacco: Former    Types: Snuff, Chew    Quit date: 10/05/2019  Vaping Use   Vaping status: Never Used  Substance and Sexual Activity   Alcohol use: No   Drug use: No   Sexual activity: Yes  Other Topics Concern   Not on file  Social History Narrative   Lives with daughter   Social Drivers of Health   Financial Resource Strain: Low Risk  (04/12/2024)   Overall Financial Resource Strain (CARDIA)    Difficulty of Paying Living Expenses: Not hard at all  Food Insecurity: No Food Insecurity (04/12/2024)   Hunger Vital Sign    Worried About Running Out of Food in the Last Year: Never true    Ran Out of Food in the Last Year: Never true  Transportation Needs: No Transportation Needs (04/12/2024)   PRAPARE - Administrator, Civil Service (Medical): No    Lack of Transportation (Non-Medical): No  Physical Activity: Insufficiently Active (04/12/2024)   Exercise Vital Sign    Days of Exercise per Week: 2 days    Minutes of Exercise per Session: 20 min  Stress: No Stress Concern Present (04/12/2024)   [employer] of Occupational Health - Occupational Stress Questionnaire     Feeling of Stress: Not at all  Social Connections: Socially Isolated (04/12/2024)   Social Connection and Isolation Panel    Frequency of Communication with Friends and Family: Once a week    Frequency of Social Gatherings with Friends and Family: Once a week    Attends Religious Services: More than 4 times per year    Active Member of [employer] or Organizations: No    Attends Engineer, structural: Never    Marital Status: Divorced    Tobacco Counseling Ready to quit: No Counseling given: Yes    Clinical Intake:  Pre-visit preparation completed: Yes  Pain : No/denies pain     BMI - recorded: 47.59 Nutritional Status: BMI > 30  Obese Nutritional Risks: None Diabetes: No  Lab Results  Component Value Date   HGBA1C 5.5 04/05/2024   HGBA1C 5.7 (H) 01/05/2024   HGBA1C 5.6 10/06/2023     How often do you need to have someone help you when you read instructions, pamphlets, or other written materials from your doctor or pharmacy?: 1 - Never  Interpreter Needed?: No  Information entered by :: alia t/cma   Activities of Daily Living     04/12/2024    4:05 PM  In your present state of health, do you have any difficulty performing the following activities:  Hearing? 0  Vision? 1  Difficulty concentrating or making decisions? 0  Walking or climbing stairs? 0  Dressing or bathing? 0  Doing errands, shopping? 0  Preparing Food and eating ? N  Using the Toilet? N  In the past six months, have you accidently leaked urine? N  Do you have problems with loss of bowel control? N  Managing your Medications? N  Managing your Finances? N  Housekeeping or managing your Housekeeping? N    Patient Care Team: Zollie Lowers, MD as PCP - General (Family Medicine)  I have updated your Care Teams any recent Medical Services you may have received from other providers in the past year.     Assessment:   This is a  routine wellness examination for Roy [name].  Hearing/Vision  screen Hearing Screening - Comments:: Pt denies hearing dif Vision Screening - Comments:: Pt have some vision dif/pt goes to [facility], in [place]/last ov has been a couple yrs   Goals Addressed             This Visit's Progress    Patient Stated   On track    He wants to lose weight, be healthy and raise his daughter well, and be around when she is grown       Depression Screen     04/12/2024    4:08 PM 04/05/2024    8:51 AM 01/05/2024   10:33 AM 12/03/2023    2:32 PM 11/24/2023   12:58 PM 11/06/2023    9:36 AM 10/06/2023   10:24 AM  PHQ 2/9 Scores  PHQ - 2 Score 3 2 2 2 2 2 2   PHQ- 9 Score 5 3 3 3 3 3 3     Fall Risk     04/12/2024    4:02 PM 11/06/2023    9:36 AM 10/31/2023   10:41 AM 10/06/2023   10:24 AM 08/13/2023    9:38 AM  Fall Risk   Falls in the past year? 0 0 0 0 0  Number falls in past yr: 0  0    Injury with Fall? 0  0    Risk for fall due to : No Fall Risks  No Fall Risks    Follow up Falls evaluation completed        MEDICARE RISK AT HOME:  Medicare Risk at Home Any stairs in or around the home?: Yes If so, are there any without handrails?: Yes Home free of loose throw rugs in walkways, pet beds, electrical cords, etc?: Yes Adequate lighting in your home to reduce risk of falls?: Yes Life alert?: No Use of a cane, walker or w/c?: No Grab bars in the bathroom?: Yes Shower chair or bench in shower?: Yes Elevated toilet seat or a handicapped toilet?: Yes  TIMED UP AND GO:  Was the test performed?  no  Cognitive Function: 6CIT completed    10/22/2017    9:09 AM  MMSE - Mini Mental State Exam  Orientation to time 5   Orientation to Place 5   Registration 3   Attention/ Calculation 3   Recall 3   Language- name 2 objects 2   Language- repeat 1  Language- follow 3 step command 3   Language- read & follow direction 1   Write a sentence 1   Copy design 1   Total score 28      Data saved with a previous flowsheet row definition         04/12/2024    4:09 PM 12/17/2022    8:20 AM 07/18/2022    8:23 AM  6CIT Screen  What Year? 0 points 0 points 0 points  What month? 0 points 0 points 0 points  What time? 0 points 0 points 0 points  Count back from 20 0 points 0 points 0 points  Months in reverse 4 points 4 points 0 points  Repeat phrase 0 points 0 points 0 points  Total Score 4 points 4 points 0 points    Immunizations Immunization History  Administered Date(s) Administered   Dtap, Unspecified 07/20/1973, 10/05/1973, 01/12/1974, 12/15/1974   Influenza,inj,Quad PF,6+ Mos 07/01/2018   Influenza-Unspecified 07/01/2018   MMR 07/02/1974   Polio, Unspecified 07/20/1973, 10/05/1973, 01/12/1974, 12/15/1974   Tdap 11/16/2014  Screening Tests Health Maintenance  Topic Date Due   Hepatitis C Screening  Never done   Hepatitis B Vaccines (1 of 3 - 19+ 3-dose series) Never done   Zoster Vaccines- Shingrix (1 of 2) Never done   COVID-19 Vaccine (1) 04/21/2024 (Originally 05/24/1978)   Pneumococcal Vaccine 74-84 Years old (1 of 2 - PCV) 10/05/2024 (Originally 05/24/1992)   Colonoscopy  10/30/2024 (Originally 05/24/2018)   INFLUENZA VACCINE  04/23/2024   DTaP/Tdap/Td (6 - Td or Tdap) 11/16/2024   Diabetic kidney evaluation - eGFR measurement  04/05/2025   Diabetic kidney evaluation - Urine ACR  04/05/2025   Medicare Annual Wellness (AWV)  04/12/2025   HIV Screening  Completed   HPV VACCINES  Aged Out   Meningococcal B Vaccine  Aged Out    Health Maintenance  Health Maintenance Due  Topic Date Due   Hepatitis C Screening  Never done   Hepatitis B Vaccines (1 of 3 - 19+ 3-dose series) Never done   Zoster Vaccines- Shingrix (1 of 2) Never done   Health Maintenance Items Addressed: See Nurse Notes at the end of this note  Additional Screening:  Vision Screening: Recommended annual ophthalmology exams for early detection of glaucoma and other disorders of the eye. Would you like a referral to an eye doctor? No     Dental Screening: Recommended annual dental exams for proper oral hygiene  Community Resource Referral / Chronic Care Management: CRR required this visit?  No   CCM required this visit?  No   Plan:    I have personally reviewed and noted the following in the patient's chart:   Medical and social history Use of alcohol, tobacco or illicit drugs  Current medications and supplements including opioid prescriptions. Patient is not currently taking opioid prescriptions. Functional ability and status Nutritional status Physical activity Advanced directives List of other physicians Hospitalizations, surgeries, and ER visits in previous 12 months Vitals Screenings to include cognitive, depression, and falls Referrals and appointments  In addition, I have reviewed and discussed with patient certain preventive protocols, quality metrics, and best practice recommendations. A written personalized care plan for preventive services as well as general preventive health recommendations were provided to patient.   Ozie Ned, CMA   04/12/2024   After Visit Summary: (MyChart) Due to this being a telephonic visit, the after visit summary with patients personalized plan was offered to patient via MyChart   Notes: Pt is aware and encouraged to have to following done at next visit: Hep B/C and Shingles Vaccine

## 2024-04-25 ENCOUNTER — Other Ambulatory Visit: Payer: Self-pay | Admitting: Family Medicine

## 2024-04-25 DIAGNOSIS — J418 Mixed simple and mucopurulent chronic bronchitis: Secondary | ICD-10-CM

## 2024-06-22 ENCOUNTER — Other Ambulatory Visit: Payer: Self-pay | Admitting: Family Medicine

## 2024-06-22 DIAGNOSIS — J418 Mixed simple and mucopurulent chronic bronchitis: Secondary | ICD-10-CM

## 2024-07-06 ENCOUNTER — Ambulatory Visit: Payer: Self-pay | Admitting: Family Medicine

## 2024-07-06 ENCOUNTER — Encounter: Payer: Self-pay | Admitting: Family Medicine

## 2024-07-06 ENCOUNTER — Ambulatory Visit: Admitting: Family Medicine

## 2024-07-06 VITALS — BP 110/72 | HR 78 | Temp 97.8°F | Ht 68.0 in | Wt 291.4 lb

## 2024-07-06 DIAGNOSIS — E039 Hypothyroidism, unspecified: Secondary | ICD-10-CM | POA: Diagnosis not present

## 2024-07-06 DIAGNOSIS — E119 Type 2 diabetes mellitus without complications: Secondary | ICD-10-CM

## 2024-07-06 DIAGNOSIS — Z7985 Long-term (current) use of injectable non-insulin antidiabetic drugs: Secondary | ICD-10-CM

## 2024-07-06 DIAGNOSIS — E349 Endocrine disorder, unspecified: Secondary | ICD-10-CM | POA: Diagnosis not present

## 2024-07-06 DIAGNOSIS — K5904 Chronic idiopathic constipation: Secondary | ICD-10-CM | POA: Diagnosis not present

## 2024-07-06 DIAGNOSIS — Z125 Encounter for screening for malignant neoplasm of prostate: Secondary | ICD-10-CM | POA: Diagnosis not present

## 2024-07-06 DIAGNOSIS — E782 Mixed hyperlipidemia: Secondary | ICD-10-CM

## 2024-07-06 DIAGNOSIS — I10 Essential (primary) hypertension: Secondary | ICD-10-CM | POA: Diagnosis not present

## 2024-07-06 DIAGNOSIS — F17210 Nicotine dependence, cigarettes, uncomplicated: Secondary | ICD-10-CM

## 2024-07-06 LAB — BAYER DCA HB A1C WAIVED: HB A1C (BAYER DCA - WAIVED): 5.3 % (ref 4.8–5.6)

## 2024-07-06 NOTE — Progress Notes (Signed)
 Subjective:  Patient ID: Roy [name], male    DOB: 1971/08/22  Age: 53 y.o. MRN: [id number]  CC: Medical Management of Chronic Issues   HPI  Discussed the use of AI scribe software for clinical note transcription with the patient, who gave verbal consent to proceed.  History of Present Illness Roy [name] is a 53 year old male with diabetes who presents for follow-up on his diabetes management and weight loss.  He has been managing diabetes for about a year, having transitioned from prediabetes in the spring. His recent A1c is 5.3. He does not check his blood sugar as frequently as recommended. He is currently on Mounjaro .  He has lost 22 pounds over the past three months, reducing his weight from 313 pounds. He attributes this weight loss to Mounjaro . His ideal weight is around 175 pounds, given his height of 5'8.  He experiences chronic constipation with bowel movements occurring once every one to two weeks, which improves with daily Linzess  use. Despite watery stools, he still needs to strain. He is on the highest dose of 290 mcg daily.  He smokes about half a pack of cigarettes a day.  He has six dental implants, four on the top and two on the bottom.  He has not had an up-to-date eye exam and experiences issues with his current glasses due to astigmatism.          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   He has a past surgical history that includes Rotator cuff repair (Right); Biceps tendon repair (Right); Neck surgery; and Knee arthroscopy with lateral menisectomy (Left, 10/09/2021).   His family history includes Arrhythmia in his father; Asthma in his brother and daughter; Bipolar disorder in his brother; COPD in his father; Fibromyalgia in his sister; Hypertension in his mother; Stroke (age of onset: 3) in his mother.He reports that he has been smoking cigarettes. He has a 16.5 pack-year smoking history. He quit smokeless tobacco use about 4 years ago.  His smokeless tobacco use included snuff and chew. He reports that he does not drink alcohol and does not use drugs.    ROS Review of Systems  Constitutional: Negative.   HENT: Negative.    Eyes:  Negative for visual disturbance.  Respiratory:  Negative for cough and shortness of breath.   Cardiovascular:  Negative for chest pain and leg swelling.  Gastrointestinal:  Positive for constipation (with watery BM at times. Not taking LInzess  daily). Negative for abdominal pain, diarrhea, nausea and vomiting.  Genitourinary:  Negative for difficulty urinating.  Musculoskeletal:  Negative for arthralgias and myalgias.  Skin:  Negative for rash.  Neurological:  Negative for headaches.  Psychiatric/Behavioral:  Negative for sleep disturbance.     Objective:  BP 110/72   Pulse 78   Temp 97.8 F (36.6 C)   Ht 5' 8 (1.727 m)   Wt 291 lb 6.4 oz (132.2 kg)   SpO2 95%   BMI 44.31 kg/m   BP Readings from Last 3 Encounters:  07/06/24 110/72  04/12/24 112/68  04/05/24 112/68    Wt Readings from Last 3 Encounters:  07/06/24 291 lb 6.4 oz (132.2 kg)  04/12/24 (!) 313 lb (142 kg)  04/05/24 (!)  313 lb (142 kg)     Physical Exam Physical Exam MEASUREMENTS: Height- 5 foot 8 inches, Weight- 291. GENERAL: Alert, cooperative, well developed, no acute distress HEENT: Normocephalic, normal oropharynx, moist mucous membranes CHEST: Clear to auscultation bilaterally, No wheezes, rhonchi, or crackles CARDIOVASCULAR: Normal heart rate and rhythm, S1 and S2 normal without murmurs ABDOMEN: Soft, non-tender, non-distended, without organomegaly, Normal bowel sounds EXTREMITIES: No cyanosis or edema NEUROLOGICAL: Cranial nerves grossly intact, Moves all extremities without gross motor or sensory deficit   Assessment & Plan:  Hypotestosteronism -     Testosterone ,Free and Total  New onset type 2 diabetes mellitus (HCC) -     CMP14+EGFR -     Bayer DCA Hb A1c Waived  Benign essential HTN -     CBC with Differential/Platelet  Morbid obesity (HCC) -     Lipid panel  Mixed hyperlipidemia -     PSA, total and free  Hypothyroidism, unspecified type -     TSH + free T4  Chronic idiopathic constipation  Cigarette smoker    Assessment and Plan Assessment & Plan Type 2 diabetes mellitus   Type 2 diabetes mellitus is well-controlled with a hemoglobin A1c of 5.3%, below the target of 6.5%. There is concern for hypoglycemia due to the low A1c levels. Mounjaro  is effectively managing diabetes by regulating insulin production based on carbohydrate intake and reducing hypoglycemia risk. Continue Mounjaro  as prescribed and monitor blood glucose levels regularly to prevent hypoglycemia.  Morbid obesity   He has morbid obesity but has achieved significant weight loss of 22 pounds in the last three months, reducing his weight from 313 pounds. The target weight is approximately 175 pounds, considering his height of 5 feet 8 inches. Continued weight loss is essential to reduce the risk of heart attack and stroke. Continue current weight loss efforts and encourage further reduction to reach the  target weight.  Chronic constipation   Chronic constipation persists with infrequent bowel movements, occurring once every one to two weeks. He is on the highest dose of Linzess  (290 mcg) but is not taking it consistently. Mounjaro  may contribute to constipation. Take Linzess  290 mcg daily to improve bowel regularity.  Tobacco use disorder   He has a tobacco use disorder, currently smoking 8-10 cigarettes per day. Smoking cessation is crucial due to the increased risk of heart disease, lung cancer, and exacerbation of diabetes-related complications. Encourage smoking cessation and consider prescribing Chantix  to aid in quitting.       Follow-up: Return in about 3 months (around 10/06/2024) for diabetes.  Butler Der, M.D.

## 2024-07-07 LAB — PSA, TOTAL AND FREE
PSA, Free Pct: 20 %
PSA, Free: 0.08 ng/mL
Prostate Specific Ag, Serum: 0.4 ng/mL (ref 0.0–4.0)

## 2024-07-07 LAB — CBC WITH DIFFERENTIAL/PLATELET
Basophils Absolute: 0 x10E3/uL (ref 0.0–0.2)
Basos: 1 %
EOS (ABSOLUTE): 0.1 x10E3/uL (ref 0.0–0.4)
Eos: 1 %
Hematocrit: 41.7 % (ref 37.5–51.0)
Hemoglobin: 14.2 g/dL (ref 13.0–17.7)
Immature Grans (Abs): 0 x10E3/uL (ref 0.0–0.1)
Immature Granulocytes: 0 %
Lymphocytes Absolute: 1.6 x10E3/uL (ref 0.7–3.1)
Lymphs: 26 %
MCH: 31.8 pg (ref 26.6–33.0)
MCHC: 34.1 g/dL (ref 31.5–35.7)
MCV: 94 fL (ref 79–97)
Monocytes Absolute: 0.5 x10E3/uL (ref 0.1–0.9)
Monocytes: 7 %
Neutrophils Absolute: 4 x10E3/uL (ref 1.4–7.0)
Neutrophils: 65 %
Platelets: 221 x10E3/uL (ref 150–450)
RBC: 4.46 x10E6/uL (ref 4.14–5.80)
RDW: 13.6 % (ref 11.6–15.4)
WBC: 6.2 x10E3/uL (ref 3.4–10.8)

## 2024-07-07 LAB — CMP14+EGFR
ALT: 14 IU/L (ref 0–44)
AST: 16 IU/L (ref 0–40)
Albumin: 4.5 g/dL (ref 3.8–4.9)
Alkaline Phosphatase: 85 IU/L (ref 47–123)
BUN/Creatinine Ratio: 11 (ref 9–20)
BUN: 10 mg/dL (ref 6–24)
Bilirubin Total: 0.3 mg/dL (ref 0.0–1.2)
CO2: 19 mmol/L — ABNORMAL LOW (ref 20–29)
Calcium: 9.7 mg/dL (ref 8.7–10.2)
Chloride: 102 mmol/L (ref 96–106)
Creatinine, Ser: 0.92 mg/dL (ref 0.76–1.27)
Globulin, Total: 2.3 g/dL (ref 1.5–4.5)
Glucose: 93 mg/dL (ref 70–99)
Potassium: 3.9 mmol/L (ref 3.5–5.2)
Sodium: 140 mmol/L (ref 134–144)
Total Protein: 6.8 g/dL (ref 6.0–8.5)
eGFR: 101 mL/min/1.73 (ref >59)

## 2024-07-07 LAB — TSH+FREE T4
Free T4: 0.96 ng/dL (ref 0.82–1.77)
TSH: 2.18 u[IU]/mL (ref 0.450–4.500)

## 2024-07-07 LAB — LIPID PANEL
Chol/HDL Ratio: 3.2 ratio (ref 0.0–5.0)
Cholesterol, Total: 115 mg/dL (ref 100–199)
HDL: 36 mg/dL — ABNORMAL LOW (ref >39)
LDL Chol Calc (NIH): 51 mg/dL (ref 0–99)
Triglycerides: 164 mg/dL — ABNORMAL HIGH (ref 0–149)
VLDL Cholesterol Cal: 28 mg/dL (ref 5–40)

## 2024-07-07 LAB — TESTOSTERONE,FREE AND TOTAL
Testosterone, Free: 4.6 pg/mL — AB (ref 7.2–24.0)
Testosterone: 274 ng/dL (ref 264–916)

## 2024-07-16 ENCOUNTER — Encounter: Payer: Self-pay | Admitting: *Deleted

## 2024-07-20 ENCOUNTER — Encounter: Payer: Self-pay | Admitting: Family Medicine

## 2024-07-20 DIAGNOSIS — K5904 Chronic idiopathic constipation: Secondary | ICD-10-CM

## 2024-07-20 DIAGNOSIS — K279 Peptic ulcer, site unspecified, unspecified as acute or chronic, without hemorrhage or perforation: Secondary | ICD-10-CM

## 2024-07-24 ENCOUNTER — Other Ambulatory Visit: Payer: Self-pay | Admitting: Family Medicine

## 2024-07-24 DIAGNOSIS — J418 Mixed simple and mucopurulent chronic bronchitis: Secondary | ICD-10-CM

## 2024-07-24 DIAGNOSIS — F411 Generalized anxiety disorder: Secondary | ICD-10-CM

## 2024-07-26 ENCOUNTER — Encounter: Payer: Self-pay | Admitting: Family Medicine

## 2024-08-12 ENCOUNTER — Encounter (INDEPENDENT_AMBULATORY_CARE_PROVIDER_SITE_OTHER): Payer: Self-pay | Admitting: *Deleted

## 2024-10-07 ENCOUNTER — Ambulatory Visit: Admitting: Family Medicine

## 2024-10-08 ENCOUNTER — Encounter: Payer: Self-pay | Admitting: Physician Assistant

## 2024-10-18 ENCOUNTER — Other Ambulatory Visit: Payer: Self-pay | Admitting: Family Medicine

## 2024-10-18 DIAGNOSIS — J418 Mixed simple and mucopurulent chronic bronchitis: Secondary | ICD-10-CM

## 2024-10-19 ENCOUNTER — Telehealth: Payer: Self-pay

## 2024-10-19 ENCOUNTER — Other Ambulatory Visit: Payer: Self-pay | Admitting: Family Medicine

## 2024-10-19 ENCOUNTER — Encounter: Payer: Self-pay | Admitting: Family Medicine

## 2024-10-19 DIAGNOSIS — F411 Generalized anxiety disorder: Secondary | ICD-10-CM

## 2024-10-19 MED ORDER — ALPRAZOLAM 0.5 MG PO TABS: 0.5000 mg | ORAL_TABLET | Freq: Three times a day (TID) | ORAL | 0 refills | Status: AC | PRN

## 2024-10-19 NOTE — Telephone Encounter (Signed)
 Refer to [facility]

## 2024-10-19 NOTE — Telephone Encounter (Signed)
 Copied from CRM #[id number]. Topic: Clinical - Prescription Issue >> Oct 19, 2024  1:45 PM Delon T wrote: Reason for CRM: ALPRAZolam  (XANAX ) 0.5 MG tablet- appt was rescheduled and now will  not have enough to last until next appt- need prescription renewed

## 2024-10-19 NOTE — Telephone Encounter (Signed)
 I sent in a refill. Should be enough to last until appt. Can be rescheduled

## 2024-10-27 ENCOUNTER — Other Ambulatory Visit: Payer: Self-pay | Admitting: Family Medicine

## 2024-10-27 DIAGNOSIS — E039 Hypothyroidism, unspecified: Secondary | ICD-10-CM

## 2024-11-04 ENCOUNTER — Ambulatory Visit: Admitting: Physician Assistant

## 2024-11-09 ENCOUNTER — Ambulatory Visit: Payer: Self-pay | Admitting: Family Medicine

## 2025-04-13 ENCOUNTER — Ambulatory Visit: Payer: Self-pay
